# Patient Record
Sex: Female | Born: 1980 | Race: Black or African American | Hispanic: No | Smoking: Former smoker
Health system: Southern US, Community
[De-identification: ages and names within clinical notes are randomized; demographics above are authoritative.]

## PROBLEM LIST (undated history)

## (undated) ENCOUNTER — Inpatient Hospital Stay (HOSPITAL_COMMUNITY): Payer: Self-pay

## (undated) ENCOUNTER — Inpatient Hospital Stay (HOSPITAL_COMMUNITY): Payer: Medicaid Other

## (undated) DIAGNOSIS — R569 Unspecified convulsions: Secondary | ICD-10-CM

## (undated) DIAGNOSIS — Z7289 Other problems related to lifestyle: Secondary | ICD-10-CM

## (undated) DIAGNOSIS — R109 Unspecified abdominal pain: Secondary | ICD-10-CM

## (undated) DIAGNOSIS — I10 Essential (primary) hypertension: Secondary | ICD-10-CM

## (undated) DIAGNOSIS — F1911 Other psychoactive substance abuse, in remission: Secondary | ICD-10-CM

## (undated) DIAGNOSIS — A749 Chlamydial infection, unspecified: Secondary | ICD-10-CM

## (undated) DIAGNOSIS — K219 Gastro-esophageal reflux disease without esophagitis: Secondary | ICD-10-CM

## (undated) DIAGNOSIS — IMO0002 Reserved for concepts with insufficient information to code with codable children: Secondary | ICD-10-CM

## (undated) DIAGNOSIS — F319 Bipolar disorder, unspecified: Secondary | ICD-10-CM

## (undated) DIAGNOSIS — D573 Sickle-cell trait: Secondary | ICD-10-CM

## (undated) DIAGNOSIS — B999 Unspecified infectious disease: Secondary | ICD-10-CM

## (undated) DIAGNOSIS — R110 Nausea: Secondary | ICD-10-CM

## (undated) DIAGNOSIS — D649 Anemia, unspecified: Secondary | ICD-10-CM

## (undated) DIAGNOSIS — F419 Anxiety disorder, unspecified: Secondary | ICD-10-CM

## (undated) DIAGNOSIS — A599 Trichomoniasis, unspecified: Secondary | ICD-10-CM

## (undated) DIAGNOSIS — F209 Schizophrenia, unspecified: Secondary | ICD-10-CM

## (undated) DIAGNOSIS — M329 Systemic lupus erythematosus, unspecified: Secondary | ICD-10-CM

## (undated) DIAGNOSIS — F32A Depression, unspecified: Secondary | ICD-10-CM

## (undated) DIAGNOSIS — B009 Herpesviral infection, unspecified: Secondary | ICD-10-CM

## (undated) DIAGNOSIS — G8929 Other chronic pain: Secondary | ICD-10-CM

## (undated) HISTORY — PX: HERNIA REPAIR: SHX51

## (undated) HISTORY — PX: OTHER SURGICAL HISTORY: SHX169

## (undated) HISTORY — PX: DILATION AND CURETTAGE OF UTERUS: SHX78

## (undated) HISTORY — DX: Anemia, unspecified: D64.9

---
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
## 1999-05-20 ENCOUNTER — Inpatient Hospital Stay (HOSPITAL_COMMUNITY): Admission: AD | Admit: 1999-05-20 | Discharge: 1999-05-20 | Payer: Self-pay | Admitting: Obstetrics

## 1999-05-21 ENCOUNTER — Encounter: Payer: Self-pay | Admitting: Obstetrics

## 1999-05-21 ENCOUNTER — Encounter (INDEPENDENT_AMBULATORY_CARE_PROVIDER_SITE_OTHER): Payer: Self-pay | Admitting: Specialist

## 1999-05-21 ENCOUNTER — Inpatient Hospital Stay (HOSPITAL_COMMUNITY): Admission: AD | Admit: 1999-05-21 | Discharge: 1999-05-21 | Payer: Self-pay | Admitting: Obstetrics

## 1999-07-25 ENCOUNTER — Inpatient Hospital Stay (HOSPITAL_COMMUNITY): Admission: AD | Admit: 1999-07-25 | Discharge: 1999-07-25 | Payer: Self-pay | Admitting: Obstetrics

## 1999-09-25 ENCOUNTER — Inpatient Hospital Stay (HOSPITAL_COMMUNITY): Admission: AD | Admit: 1999-09-25 | Discharge: 1999-09-25 | Payer: Self-pay | Admitting: *Deleted

## 1999-11-28 ENCOUNTER — Inpatient Hospital Stay (HOSPITAL_COMMUNITY): Admission: AD | Admit: 1999-11-28 | Discharge: 1999-11-28 | Payer: Self-pay | Admitting: Obstetrics

## 1999-12-07 ENCOUNTER — Inpatient Hospital Stay (HOSPITAL_COMMUNITY): Admission: AD | Admit: 1999-12-07 | Discharge: 1999-12-07 | Payer: Self-pay | Admitting: Obstetrics

## 2000-03-19 ENCOUNTER — Other Ambulatory Visit: Admission: RE | Admit: 2000-03-19 | Discharge: 2000-03-19 | Payer: Self-pay | Admitting: Obstetrics

## 2000-05-08 ENCOUNTER — Emergency Department (HOSPITAL_COMMUNITY): Admission: EM | Admit: 2000-05-08 | Discharge: 2000-05-08 | Payer: Self-pay | Admitting: Emergency Medicine

## 2000-05-08 ENCOUNTER — Encounter: Payer: Self-pay | Admitting: Emergency Medicine

## 2000-06-05 ENCOUNTER — Inpatient Hospital Stay (HOSPITAL_COMMUNITY): Admission: AD | Admit: 2000-06-05 | Discharge: 2000-06-05 | Payer: Self-pay | Admitting: Obstetrics

## 2000-12-21 ENCOUNTER — Inpatient Hospital Stay (HOSPITAL_COMMUNITY): Admission: AD | Admit: 2000-12-21 | Discharge: 2000-12-21 | Payer: Self-pay | Admitting: *Deleted

## 2000-12-22 ENCOUNTER — Encounter: Payer: Self-pay | Admitting: Obstetrics

## 2000-12-22 ENCOUNTER — Inpatient Hospital Stay (HOSPITAL_COMMUNITY): Admission: AD | Admit: 2000-12-22 | Discharge: 2000-12-22 | Payer: Self-pay | Admitting: Obstetrics

## 2000-12-29 ENCOUNTER — Inpatient Hospital Stay (HOSPITAL_COMMUNITY): Admission: AD | Admit: 2000-12-29 | Discharge: 2000-12-29 | Payer: Self-pay | Admitting: Obstetrics

## 2000-12-30 ENCOUNTER — Inpatient Hospital Stay (HOSPITAL_COMMUNITY): Admission: AD | Admit: 2000-12-30 | Discharge: 2001-01-01 | Payer: Self-pay | Admitting: Obstetrics

## 2001-01-01 ENCOUNTER — Inpatient Hospital Stay (HOSPITAL_COMMUNITY): Admission: AD | Admit: 2001-01-01 | Discharge: 2001-01-01 | Payer: Self-pay | Admitting: Obstetrics

## 2001-01-10 ENCOUNTER — Inpatient Hospital Stay (HOSPITAL_COMMUNITY): Admission: AD | Admit: 2001-01-10 | Discharge: 2001-01-10 | Payer: Self-pay | Admitting: Obstetrics & Gynecology

## 2001-02-09 ENCOUNTER — Inpatient Hospital Stay (HOSPITAL_COMMUNITY): Admission: AD | Admit: 2001-02-09 | Discharge: 2001-02-09 | Payer: Self-pay | Admitting: Obstetrics

## 2001-03-18 ENCOUNTER — Ambulatory Visit (HOSPITAL_COMMUNITY): Admission: RE | Admit: 2001-03-18 | Discharge: 2001-03-18 | Payer: Self-pay | Admitting: Obstetrics

## 2001-04-14 LAB — SICKLE CELL SCREEN

## 2001-05-21 ENCOUNTER — Inpatient Hospital Stay (HOSPITAL_COMMUNITY): Admission: AD | Admit: 2001-05-21 | Discharge: 2001-05-21 | Payer: Self-pay | Admitting: *Deleted

## 2001-05-26 ENCOUNTER — Inpatient Hospital Stay (HOSPITAL_COMMUNITY): Admission: AD | Admit: 2001-05-26 | Discharge: 2001-05-26 | Payer: Self-pay | Admitting: *Deleted

## 2001-06-11 ENCOUNTER — Inpatient Hospital Stay (HOSPITAL_COMMUNITY): Admission: AD | Admit: 2001-06-11 | Discharge: 2001-06-11 | Payer: Self-pay | Admitting: *Deleted

## 2001-06-24 ENCOUNTER — Emergency Department (HOSPITAL_COMMUNITY): Admission: EM | Admit: 2001-06-24 | Discharge: 2001-06-24 | Payer: Self-pay | Admitting: Emergency Medicine

## 2001-07-17 ENCOUNTER — Ambulatory Visit (HOSPITAL_COMMUNITY): Admission: RE | Admit: 2001-07-17 | Discharge: 2001-07-17 | Payer: Self-pay | Admitting: *Deleted

## 2001-08-13 ENCOUNTER — Inpatient Hospital Stay (HOSPITAL_COMMUNITY): Admission: AD | Admit: 2001-08-13 | Discharge: 2001-08-15 | Payer: Self-pay | Admitting: Obstetrics and Gynecology

## 2001-08-19 ENCOUNTER — Inpatient Hospital Stay (HOSPITAL_COMMUNITY): Admission: AD | Admit: 2001-08-19 | Discharge: 2001-08-19 | Payer: Self-pay | Admitting: Obstetrics and Gynecology

## 2001-09-27 ENCOUNTER — Emergency Department (HOSPITAL_COMMUNITY): Admission: EM | Admit: 2001-09-27 | Discharge: 2001-09-27 | Payer: Self-pay | Admitting: Emergency Medicine

## 2001-09-28 ENCOUNTER — Emergency Department (HOSPITAL_COMMUNITY): Admission: EM | Admit: 2001-09-28 | Discharge: 2001-09-28 | Payer: Self-pay | Admitting: Emergency Medicine

## 2001-10-11 ENCOUNTER — Emergency Department (HOSPITAL_COMMUNITY): Admission: EM | Admit: 2001-10-11 | Discharge: 2001-10-11 | Payer: Self-pay | Admitting: Emergency Medicine

## 2002-01-14 ENCOUNTER — Inpatient Hospital Stay (HOSPITAL_COMMUNITY): Admission: AD | Admit: 2002-01-14 | Discharge: 2002-01-14 | Payer: Self-pay | Admitting: Obstetrics and Gynecology

## 2002-03-24 ENCOUNTER — Inpatient Hospital Stay (HOSPITAL_COMMUNITY): Admission: EM | Admit: 2002-03-24 | Discharge: 2002-03-26 | Payer: Self-pay | Admitting: Psychiatry

## 2003-10-05 ENCOUNTER — Inpatient Hospital Stay (HOSPITAL_COMMUNITY): Admission: AD | Admit: 2003-10-05 | Discharge: 2003-10-05 | Payer: Self-pay | Admitting: *Deleted

## 2004-02-18 ENCOUNTER — Emergency Department (HOSPITAL_COMMUNITY): Admission: EM | Admit: 2004-02-18 | Discharge: 2004-02-18 | Payer: Self-pay | Admitting: Emergency Medicine

## 2004-05-18 ENCOUNTER — Inpatient Hospital Stay (HOSPITAL_COMMUNITY): Admission: AD | Admit: 2004-05-18 | Discharge: 2004-05-19 | Payer: Self-pay | Admitting: Family Medicine

## 2004-08-01 ENCOUNTER — Inpatient Hospital Stay (HOSPITAL_COMMUNITY): Admission: AD | Admit: 2004-08-01 | Discharge: 2004-08-01 | Payer: Self-pay | Admitting: Obstetrics and Gynecology

## 2004-08-30 ENCOUNTER — Emergency Department (HOSPITAL_COMMUNITY): Admission: EM | Admit: 2004-08-30 | Discharge: 2004-08-30 | Payer: Self-pay | Admitting: Family Medicine

## 2004-11-03 ENCOUNTER — Emergency Department (HOSPITAL_COMMUNITY): Admission: EM | Admit: 2004-11-03 | Discharge: 2004-11-03 | Payer: Self-pay | Admitting: Emergency Medicine

## 2004-11-06 ENCOUNTER — Emergency Department (HOSPITAL_COMMUNITY): Admission: EM | Admit: 2004-11-06 | Discharge: 2004-11-06 | Payer: Self-pay | Admitting: Emergency Medicine

## 2004-11-25 ENCOUNTER — Emergency Department (HOSPITAL_COMMUNITY): Admission: EM | Admit: 2004-11-25 | Discharge: 2004-11-25 | Payer: Self-pay | Admitting: Emergency Medicine

## 2004-12-04 ENCOUNTER — Ambulatory Visit: Payer: Self-pay | Admitting: Psychiatry

## 2004-12-04 ENCOUNTER — Inpatient Hospital Stay (HOSPITAL_COMMUNITY): Admission: AD | Admit: 2004-12-04 | Discharge: 2004-12-13 | Payer: Self-pay | Admitting: Psychiatry

## 2005-04-16 ENCOUNTER — Emergency Department (HOSPITAL_COMMUNITY): Admission: EM | Admit: 2005-04-16 | Discharge: 2005-04-17 | Payer: Self-pay | Admitting: Emergency Medicine

## 2005-04-16 ENCOUNTER — Emergency Department (HOSPITAL_COMMUNITY): Admission: EM | Admit: 2005-04-16 | Discharge: 2005-04-16 | Payer: Self-pay | Admitting: Emergency Medicine

## 2006-01-09 ENCOUNTER — Emergency Department (HOSPITAL_COMMUNITY): Admission: EM | Admit: 2006-01-09 | Discharge: 2006-01-09 | Payer: Self-pay | Admitting: Emergency Medicine

## 2006-01-11 ENCOUNTER — Emergency Department (HOSPITAL_COMMUNITY): Admission: EM | Admit: 2006-01-11 | Discharge: 2006-01-11 | Payer: Self-pay | Admitting: Emergency Medicine

## 2006-01-25 ENCOUNTER — Emergency Department (HOSPITAL_COMMUNITY): Admission: EM | Admit: 2006-01-25 | Discharge: 2006-01-25 | Payer: Self-pay | Admitting: Emergency Medicine

## 2006-02-08 ENCOUNTER — Emergency Department (HOSPITAL_COMMUNITY): Admission: EM | Admit: 2006-02-08 | Discharge: 2006-02-08 | Payer: Self-pay | Admitting: Emergency Medicine

## 2006-03-24 ENCOUNTER — Inpatient Hospital Stay (HOSPITAL_COMMUNITY): Admission: AD | Admit: 2006-03-24 | Discharge: 2006-03-24 | Payer: Self-pay | Admitting: Obstetrics & Gynecology

## 2006-04-08 DIAGNOSIS — S069XAA Unspecified intracranial injury with loss of consciousness status unknown, initial encounter: Secondary | ICD-10-CM

## 2006-04-08 HISTORY — DX: Unspecified intracranial injury with loss of consciousness status unknown, initial encounter: S06.9XAA

## 2006-04-15 ENCOUNTER — Ambulatory Visit (HOSPITAL_COMMUNITY): Admission: RE | Admit: 2006-04-15 | Discharge: 2006-04-15 | Payer: Self-pay | Admitting: Obstetrics & Gynecology

## 2006-07-02 ENCOUNTER — Inpatient Hospital Stay (HOSPITAL_COMMUNITY): Admission: AD | Admit: 2006-07-02 | Discharge: 2006-07-02 | Payer: Self-pay | Admitting: Gynecology

## 2006-08-19 ENCOUNTER — Ambulatory Visit: Payer: Self-pay | Admitting: *Deleted

## 2006-08-19 ENCOUNTER — Inpatient Hospital Stay (HOSPITAL_COMMUNITY): Admission: AD | Admit: 2006-08-19 | Discharge: 2006-08-19 | Payer: Self-pay | Admitting: Obstetrics & Gynecology

## 2006-08-19 ENCOUNTER — Ambulatory Visit: Payer: Self-pay | Admitting: Obstetrics and Gynecology

## 2006-10-03 ENCOUNTER — Ambulatory Visit: Payer: Self-pay | Admitting: Obstetrics and Gynecology

## 2006-10-03 ENCOUNTER — Inpatient Hospital Stay (HOSPITAL_COMMUNITY): Admission: AD | Admit: 2006-10-03 | Discharge: 2006-10-03 | Payer: Self-pay | Admitting: Gynecology

## 2006-10-15 ENCOUNTER — Encounter: Payer: Self-pay | Admitting: Obstetrics and Gynecology

## 2006-10-15 ENCOUNTER — Ambulatory Visit: Payer: Self-pay | Admitting: Obstetrics & Gynecology

## 2006-10-29 ENCOUNTER — Ambulatory Visit: Payer: Self-pay | Admitting: Obstetrics & Gynecology

## 2006-11-13 ENCOUNTER — Inpatient Hospital Stay (HOSPITAL_COMMUNITY): Admission: AD | Admit: 2006-11-13 | Discharge: 2006-11-15 | Payer: Self-pay | Admitting: Gynecology

## 2006-11-13 ENCOUNTER — Ambulatory Visit: Payer: Self-pay | Admitting: *Deleted

## 2007-03-07 ENCOUNTER — Emergency Department (HOSPITAL_COMMUNITY): Admission: EM | Admit: 2007-03-07 | Discharge: 2007-03-08 | Payer: Self-pay | Admitting: *Deleted

## 2007-03-17 ENCOUNTER — Emergency Department (HOSPITAL_COMMUNITY): Admission: EM | Admit: 2007-03-17 | Discharge: 2007-03-17 | Payer: Self-pay | Admitting: Emergency Medicine

## 2007-05-30 ENCOUNTER — Emergency Department (HOSPITAL_COMMUNITY): Admission: EM | Admit: 2007-05-30 | Discharge: 2007-05-30 | Payer: Self-pay | Admitting: Emergency Medicine

## 2007-06-02 ENCOUNTER — Inpatient Hospital Stay (HOSPITAL_COMMUNITY): Admission: AD | Admit: 2007-06-02 | Discharge: 2007-06-02 | Payer: Self-pay | Admitting: Family Medicine

## 2007-06-03 ENCOUNTER — Ambulatory Visit (HOSPITAL_COMMUNITY): Admission: AD | Admit: 2007-06-03 | Discharge: 2007-06-03 | Payer: Self-pay | Admitting: Obstetrics & Gynecology

## 2007-06-03 ENCOUNTER — Ambulatory Visit: Payer: Self-pay | Admitting: Obstetrics & Gynecology

## 2007-06-03 ENCOUNTER — Encounter: Payer: Self-pay | Admitting: Obstetrics & Gynecology

## 2007-06-06 ENCOUNTER — Inpatient Hospital Stay (HOSPITAL_COMMUNITY): Admission: AD | Admit: 2007-06-06 | Discharge: 2007-06-06 | Payer: Self-pay | Admitting: Gynecology

## 2008-01-30 ENCOUNTER — Emergency Department (HOSPITAL_COMMUNITY): Admission: EM | Admit: 2008-01-30 | Discharge: 2008-01-31 | Payer: Self-pay | Admitting: Emergency Medicine

## 2008-02-04 ENCOUNTER — Emergency Department (HOSPITAL_COMMUNITY): Admission: EM | Admit: 2008-02-04 | Discharge: 2008-02-04 | Payer: Self-pay | Admitting: Emergency Medicine

## 2008-02-11 ENCOUNTER — Emergency Department (HOSPITAL_COMMUNITY): Admission: EM | Admit: 2008-02-11 | Discharge: 2008-02-11 | Payer: Self-pay | Admitting: Emergency Medicine

## 2008-02-25 ENCOUNTER — Emergency Department (HOSPITAL_BASED_OUTPATIENT_CLINIC_OR_DEPARTMENT_OTHER): Admission: EM | Admit: 2008-02-25 | Discharge: 2008-02-25 | Payer: Self-pay | Admitting: Emergency Medicine

## 2008-04-08 HISTORY — PX: HEMORROIDECTOMY: SUR656

## 2008-04-18 ENCOUNTER — Ambulatory Visit (HOSPITAL_COMMUNITY): Admission: RE | Admit: 2008-04-18 | Discharge: 2008-04-18 | Payer: Self-pay | Admitting: Obstetrics

## 2008-05-10 ENCOUNTER — Inpatient Hospital Stay (HOSPITAL_COMMUNITY): Admission: AD | Admit: 2008-05-10 | Discharge: 2008-05-10 | Payer: Self-pay | Admitting: Obstetrics & Gynecology

## 2008-05-16 ENCOUNTER — Emergency Department (HOSPITAL_COMMUNITY): Admission: EM | Admit: 2008-05-16 | Discharge: 2008-05-16 | Payer: Self-pay | Admitting: Emergency Medicine

## 2008-07-17 ENCOUNTER — Emergency Department (HOSPITAL_COMMUNITY): Admission: EM | Admit: 2008-07-17 | Discharge: 2008-07-17 | Payer: Self-pay | Admitting: Family Medicine

## 2008-08-16 ENCOUNTER — Inpatient Hospital Stay (HOSPITAL_COMMUNITY): Admission: AD | Admit: 2008-08-16 | Discharge: 2008-08-16 | Payer: Self-pay | Admitting: Obstetrics

## 2008-09-06 ENCOUNTER — Inpatient Hospital Stay (HOSPITAL_COMMUNITY): Admission: AD | Admit: 2008-09-06 | Discharge: 2008-09-06 | Payer: Self-pay | Admitting: Obstetrics

## 2008-09-07 ENCOUNTER — Inpatient Hospital Stay (HOSPITAL_COMMUNITY): Admission: RE | Admit: 2008-09-07 | Discharge: 2008-09-09 | Payer: Self-pay | Admitting: Obstetrics & Gynecology

## 2008-11-15 ENCOUNTER — Encounter (INDEPENDENT_AMBULATORY_CARE_PROVIDER_SITE_OTHER): Payer: Self-pay | Admitting: General Surgery

## 2008-11-15 ENCOUNTER — Ambulatory Visit (HOSPITAL_BASED_OUTPATIENT_CLINIC_OR_DEPARTMENT_OTHER): Admission: RE | Admit: 2008-11-15 | Discharge: 2008-11-15 | Payer: Self-pay | Admitting: General Surgery

## 2008-12-17 ENCOUNTER — Emergency Department (HOSPITAL_COMMUNITY): Admission: EM | Admit: 2008-12-17 | Discharge: 2008-12-17 | Payer: Self-pay | Admitting: Emergency Medicine

## 2009-03-25 ENCOUNTER — Emergency Department (HOSPITAL_COMMUNITY): Admission: EM | Admit: 2009-03-25 | Discharge: 2009-03-25 | Payer: Self-pay | Admitting: Emergency Medicine

## 2009-04-04 ENCOUNTER — Emergency Department (HOSPITAL_COMMUNITY): Admission: EM | Admit: 2009-04-04 | Discharge: 2009-04-04 | Payer: Self-pay | Admitting: Emergency Medicine

## 2009-06-21 ENCOUNTER — Emergency Department (HOSPITAL_COMMUNITY): Admission: EM | Admit: 2009-06-21 | Discharge: 2009-06-22 | Payer: Self-pay | Admitting: Internal Medicine

## 2009-08-02 ENCOUNTER — Emergency Department (HOSPITAL_COMMUNITY): Admission: EM | Admit: 2009-08-02 | Discharge: 2009-08-02 | Payer: Self-pay | Admitting: Emergency Medicine

## 2009-08-06 ENCOUNTER — Ambulatory Visit: Payer: Self-pay | Admitting: Advanced Practice Midwife

## 2009-08-06 ENCOUNTER — Inpatient Hospital Stay (HOSPITAL_COMMUNITY): Admission: AD | Admit: 2009-08-06 | Discharge: 2009-08-06 | Payer: Self-pay | Admitting: Obstetrics & Gynecology

## 2009-08-22 ENCOUNTER — Ambulatory Visit (HOSPITAL_COMMUNITY): Admission: RE | Admit: 2009-08-22 | Discharge: 2009-08-22 | Payer: Self-pay | Admitting: Obstetrics

## 2009-11-14 ENCOUNTER — Ambulatory Visit (HOSPITAL_COMMUNITY): Admission: RE | Admit: 2009-11-14 | Discharge: 2009-11-14 | Payer: Self-pay | Admitting: Obstetrics

## 2010-02-16 ENCOUNTER — Inpatient Hospital Stay (HOSPITAL_COMMUNITY): Admission: AD | Admit: 2010-02-16 | Discharge: 2010-02-21 | Payer: Self-pay | Admitting: Obstetrics & Gynecology

## 2010-02-19 ENCOUNTER — Encounter: Payer: Self-pay | Admitting: Obstetrics

## 2010-02-19 DIAGNOSIS — F3112 Bipolar disorder, current episode manic without psychotic features, moderate: Secondary | ICD-10-CM

## 2010-03-25 ENCOUNTER — Inpatient Hospital Stay (HOSPITAL_COMMUNITY)
Admission: AD | Admit: 2010-03-25 | Discharge: 2010-03-25 | Payer: Self-pay | Source: Home / Self Care | Attending: Obstetrics | Admitting: Obstetrics

## 2010-03-26 ENCOUNTER — Inpatient Hospital Stay (HOSPITAL_COMMUNITY)
Admission: AD | Admit: 2010-03-26 | Discharge: 2010-03-29 | Payer: Self-pay | Source: Home / Self Care | Attending: Obstetrics & Gynecology | Admitting: Obstetrics & Gynecology

## 2010-04-29 ENCOUNTER — Encounter: Payer: Self-pay | Admitting: Obstetrics & Gynecology

## 2010-04-29 ENCOUNTER — Encounter: Payer: Self-pay | Admitting: Obstetrics

## 2010-06-18 LAB — RPR: RPR Ser Ql: NONREACTIVE

## 2010-06-18 LAB — COMPREHENSIVE METABOLIC PANEL
ALT: 9 U/L (ref 0–35)
CO2: 19 mEq/L (ref 19–32)
Calcium: 9.2 mg/dL (ref 8.4–10.5)
Chloride: 102 mEq/L (ref 96–112)
Creatinine, Ser: 0.62 mg/dL (ref 0.4–1.2)
GFR calc non Af Amer: >60 mL/min (ref >60)
Glucose, Bld: 65 mg/dL — ABNORMAL LOW (ref 70–99)
Total Bilirubin: 2.2 mg/dL — ABNORMAL HIGH (ref 0.3–1.2)

## 2010-06-18 LAB — CBC
HCT: 31 % — ABNORMAL LOW (ref 36.0–46.0)
HCT: 35.6 % — ABNORMAL LOW (ref 36.0–46.0)
Hemoglobin: 12 g/dL (ref 12.0–15.0)
MCH: 29.5 pg (ref 26.0–34.0)
MCH: 29.8 pg (ref 26.0–34.0)
MCHC: 33.7 g/dL (ref 30.0–36.0)
MCHC: 34.2 g/dL (ref 30.0–36.0)
RDW: 15.3 % (ref 11.5–15.5)

## 2010-06-18 LAB — LACTATE DEHYDROGENASE: LDH: 132 U/L (ref 94–250)

## 2010-06-18 LAB — MRSA PCR SCREENING: MRSA by PCR: NEGATIVE

## 2010-06-20 LAB — STREP B DNA PROBE: Strep Group B Ag: NEGATIVE

## 2010-06-20 LAB — WET PREP, GENITAL
Clue Cells Wet Prep HPF POC: NONE SEEN
Trich, Wet Prep: NONE SEEN

## 2010-06-20 LAB — COMPREHENSIVE METABOLIC PANEL
ALT: 15 U/L (ref 0–35)
AST: 24 U/L (ref 0–37)
Albumin: 2.7 g/dL — ABNORMAL LOW (ref 3.5–5.2)
CO2: 23 mEq/L (ref 19–32)
Chloride: 104 mEq/L (ref 96–112)
Creatinine, Ser: 0.58 mg/dL (ref 0.4–1.2)
GFR calc Af Amer: >60 mL/min (ref >60)
GFR calc non Af Amer: >60 mL/min (ref >60)
Sodium: 134 mEq/L — ABNORMAL LOW (ref 135–145)
Total Bilirubin: 0.4 mg/dL (ref 0.3–1.2)

## 2010-06-20 LAB — RAPID URINE DRUG SCREEN, HOSP PERFORMED
Amphetamines: NOT DETECTED
Tetrahydrocannabinol: POSITIVE — AB

## 2010-06-20 LAB — URINALYSIS, ROUTINE W REFLEX MICROSCOPIC
Glucose, UA: NEGATIVE mg/dL
Hgb urine dipstick: NEGATIVE
Ketones, ur: >80 mg/dL — AB
Protein, ur: NEGATIVE mg/dL
Urobilinogen, UA: 1 mg/dL (ref 0.0–1.0)

## 2010-06-20 LAB — CBC
Hemoglobin: 10.2 g/dL — ABNORMAL LOW (ref 12.0–15.0)
Platelets: 187 10*3/uL (ref 150–400)
RBC: 3.17 MIL/uL — ABNORMAL LOW (ref 3.87–5.11)
WBC: 14.6 10*3/uL — ABNORMAL HIGH (ref 4.0–10.5)

## 2010-06-20 LAB — MRSA PCR SCREENING: MRSA by PCR: NEGATIVE

## 2010-06-20 LAB — GC/CHLAMYDIA PROBE AMP, GENITAL: GC Probe Amp, Genital: NEGATIVE

## 2010-06-26 LAB — COMPREHENSIVE METABOLIC PANEL
Albumin: 3.4 g/dL — ABNORMAL LOW (ref 3.5–5.2)
Alkaline Phosphatase: 45 U/L (ref 39–117)
BUN: 7 mg/dL (ref 6–23)
Calcium: 9 mg/dL (ref 8.4–10.5)
Glucose, Bld: 90 mg/dL (ref 70–99)
Potassium: 3.5 mEq/L (ref 3.5–5.1)
Sodium: 136 mEq/L (ref 135–145)
Total Protein: 5.9 g/dL — ABNORMAL LOW (ref 6.0–8.3)

## 2010-06-26 LAB — URINALYSIS, ROUTINE W REFLEX MICROSCOPIC
Bilirubin Urine: NEGATIVE
Hgb urine dipstick: NEGATIVE
Ketones, ur: NEGATIVE mg/dL
Ketones, ur: 15 mg/dL — AB
Leukocytes, UA: NEGATIVE
Nitrite: NEGATIVE
Nitrite: NEGATIVE
Specific Gravity, Urine: 1.015 (ref 1.005–1.030)
Urobilinogen, UA: 1 mg/dL (ref 0.0–1.0)
pH: 7 (ref 5.0–8.0)
pH: 6 (ref 5.0–8.0)

## 2010-06-26 LAB — URINE CULTURE: Colony Count: NO GROWTH

## 2010-06-26 LAB — RAPID URINE DRUG SCREEN, HOSP PERFORMED
Amphetamines: NOT DETECTED
Amphetamines: NOT DETECTED
Cocaine: POSITIVE — AB
Cocaine: POSITIVE — AB
Opiates: NOT DETECTED
Opiates: NOT DETECTED
Tetrahydrocannabinol: POSITIVE — AB
Tetrahydrocannabinol: POSITIVE — AB

## 2010-06-26 LAB — CBC
HCT: 32 % — ABNORMAL LOW (ref 36.0–46.0)
Hemoglobin: 10.9 g/dL — ABNORMAL LOW (ref 12.0–15.0)
Hemoglobin: 13.5 g/dL (ref 12.0–15.0)
MCHC: 34.2 g/dL (ref 30.0–36.0)
MCHC: 33.3 g/dL (ref 30.0–36.0)
Platelets: 168 10*3/uL (ref 150–400)
RBC: 4.45 MIL/uL (ref 3.87–5.11)
RDW: 14.8 % (ref 11.5–15.5)
WBC: 11.1 10*3/uL — ABNORMAL HIGH (ref 4.0–10.5)

## 2010-06-26 LAB — DIFFERENTIAL
Basophils Relative: 1 % (ref 0–1)
Lymphocytes Relative: 19 % (ref 12–46)
Lymphs Abs: 2.1 10*3/uL (ref 0.7–4.0)
Monocytes Absolute: 1 10*3/uL (ref 0.1–1.0)
Monocytes Relative: 9 % (ref 3–12)
Neutro Abs: 7.9 10*3/uL — ABNORMAL HIGH (ref 1.7–7.7)
Neutrophils Relative %: 71 % (ref 43–77)

## 2010-06-26 LAB — PREGNANCY, URINE: Preg Test, Ur: POSITIVE

## 2010-06-26 LAB — WET PREP, GENITAL
Trich, Wet Prep: NONE SEEN
Yeast Wet Prep HPF POC: NONE SEEN

## 2010-06-26 LAB — BASIC METABOLIC PANEL
Calcium: 9.4 mg/dL (ref 8.4–10.5)
Creatinine, Ser: 1.05 mg/dL (ref 0.4–1.2)
GFR calc Af Amer: >60 mL/min (ref >60)
GFR calc non Af Amer: >60 mL/min (ref >60)
Sodium: 135 mEq/L (ref 135–145)

## 2010-06-26 LAB — GC/CHLAMYDIA PROBE AMP, GENITAL
Chlamydia, DNA Probe: NEGATIVE
GC Probe Amp, Genital: NEGATIVE

## 2010-06-26 LAB — ACETAMINOPHEN LEVEL: Acetaminophen (Tylenol), Serum: <10 ug/mL — ABNORMAL LOW (ref 10–30)

## 2010-06-26 LAB — POCT PREGNANCY, URINE: Preg Test, Ur: POSITIVE

## 2010-06-26 LAB — URINE MICROSCOPIC-ADD ON

## 2010-06-26 LAB — ETHANOL: Alcohol, Ethyl (B): <5 mg/dL (ref 0–10)

## 2010-06-26 LAB — HCG, QUANTITATIVE, PREGNANCY: hCG, Beta Chain, Quant, S: 12476 m[IU]/mL — ABNORMAL HIGH (ref <5)

## 2010-07-01 LAB — DIFFERENTIAL
Basophils Absolute: 0.1 10*3/uL (ref 0.0–0.1)
Basophils Relative: 1 % (ref 0–1)
Monocytes Absolute: 0.9 10*3/uL (ref 0.1–1.0)
Neutro Abs: 7.2 10*3/uL (ref 1.7–7.7)
Neutrophils Relative %: 65 % (ref 43–77)

## 2010-07-01 LAB — URINALYSIS, ROUTINE W REFLEX MICROSCOPIC
Glucose, UA: NEGATIVE mg/dL
pH: 5.5 (ref 5.0–8.0)

## 2010-07-01 LAB — CBC
HCT: 38 % (ref 36.0–46.0)
Hemoglobin: 13 g/dL (ref 12.0–15.0)
WBC: 11.1 10*3/uL — ABNORMAL HIGH (ref 4.0–10.5)

## 2010-07-01 LAB — WET PREP, GENITAL: Yeast Wet Prep HPF POC: NONE SEEN

## 2010-07-01 LAB — COMPREHENSIVE METABOLIC PANEL
Albumin: 4.5 g/dL (ref 3.5–5.2)
Alkaline Phosphatase: 63 U/L (ref 39–117)
BUN: 8 mg/dL (ref 6–23)
CO2: 20 mEq/L (ref 19–32)
Chloride: 103 mEq/L (ref 96–112)
GFR calc non Af Amer: >60 mL/min (ref >60)
Glucose, Bld: 69 mg/dL — ABNORMAL LOW (ref 70–99)
Potassium: 3.7 mEq/L (ref 3.5–5.1)
Total Bilirubin: 2.3 mg/dL — ABNORMAL HIGH (ref 0.3–1.2)

## 2010-07-01 LAB — POCT PREGNANCY, URINE: Preg Test, Ur: NEGATIVE

## 2010-07-09 LAB — BASIC METABOLIC PANEL
CO2: 21 mEq/L (ref 19–32)
Chloride: 105 mEq/L (ref 96–112)
GFR calc Af Amer: >60 mL/min (ref >60)
Sodium: 133 mEq/L — ABNORMAL LOW (ref 135–145)

## 2010-07-09 LAB — POCT PREGNANCY, URINE: Preg Test, Ur: NEGATIVE

## 2010-07-09 LAB — URINALYSIS, ROUTINE W REFLEX MICROSCOPIC
Glucose, UA: NEGATIVE mg/dL
Hgb urine dipstick: NEGATIVE
Specific Gravity, Urine: 1.018 (ref 1.005–1.030)
Urobilinogen, UA: 1 mg/dL (ref 0.0–1.0)
pH: 7.5 (ref 5.0–8.0)

## 2010-07-09 LAB — URINE CULTURE: Colony Count: 30000

## 2010-07-09 LAB — GC/CHLAMYDIA PROBE AMP, GENITAL: GC Probe Amp, Genital: NEGATIVE

## 2010-07-13 LAB — POCT URINALYSIS DIP (DEVICE)
Glucose, UA: NEGATIVE mg/dL
Specific Gravity, Urine: 1.02 (ref 1.005–1.030)
Urobilinogen, UA: 0.2 mg/dL (ref 0.0–1.0)

## 2010-07-13 LAB — URINE CULTURE: Colony Count: 65000

## 2010-07-13 LAB — WET PREP, GENITAL
WBC, Wet Prep HPF POC: NONE SEEN
Yeast Wet Prep HPF POC: NONE SEEN

## 2010-07-13 LAB — POCT PREGNANCY, URINE: Preg Test, Ur: NEGATIVE

## 2010-07-14 LAB — POCT I-STAT 4, (NA,K, GLUC, HGB,HCT): Potassium: 3.5 mEq/L (ref 3.5–5.1)

## 2010-07-16 LAB — CBC
HCT: 28.6 % — ABNORMAL LOW (ref 36.0–46.0)
HCT: 29 % — ABNORMAL LOW (ref 36.0–46.0)
Hemoglobin: 9.6 g/dL — ABNORMAL LOW (ref 12.0–15.0)
Hemoglobin: 9.7 g/dL — ABNORMAL LOW (ref 12.0–15.0)
MCHC: 33.5 g/dL (ref 30.0–36.0)
MCV: 84.5 fL (ref 78.0–100.0)
MCV: 85.2 fL (ref 78.0–100.0)
RBC: 3.43 MIL/uL — ABNORMAL LOW (ref 3.87–5.11)
RDW: 18.3 % — ABNORMAL HIGH (ref 11.5–15.5)
WBC: 10.9 10*3/uL — ABNORMAL HIGH (ref 4.0–10.5)

## 2010-07-17 LAB — URINALYSIS, ROUTINE W REFLEX MICROSCOPIC
Bilirubin Urine: NEGATIVE
Hgb urine dipstick: NEGATIVE
Protein, ur: NEGATIVE mg/dL
Urobilinogen, UA: 1 mg/dL (ref 0.0–1.0)

## 2010-07-17 LAB — WET PREP, GENITAL: Trich, Wet Prep: NONE SEEN

## 2010-07-17 LAB — CBC
Hemoglobin: 9.5 g/dL — ABNORMAL LOW (ref 12.0–15.0)
RBC: 3.28 MIL/uL — ABNORMAL LOW (ref 3.87–5.11)
RDW: 16.6 % — ABNORMAL HIGH (ref 11.5–15.5)

## 2010-07-18 LAB — WET PREP, GENITAL: Trich, Wet Prep: NONE SEEN

## 2010-07-18 LAB — POCT URINALYSIS DIP (DEVICE)
Glucose, UA: NEGATIVE mg/dL
Hgb urine dipstick: NEGATIVE
pH: 6 (ref 5.0–8.0)

## 2010-07-24 LAB — URINALYSIS, ROUTINE W REFLEX MICROSCOPIC
Bilirubin Urine: NEGATIVE
Glucose, UA: NEGATIVE mg/dL
Hgb urine dipstick: NEGATIVE
Ketones, ur: NEGATIVE mg/dL
Leukocytes, UA: NEGATIVE
Nitrite: NEGATIVE
Protein, ur: 30 mg/dL — AB
Protein, ur: NEGATIVE mg/dL
Urobilinogen, UA: 0.2 mg/dL (ref 0.0–1.0)
pH: 6 (ref 5.0–8.0)
pH: 6 (ref 5.0–8.0)

## 2010-07-24 LAB — COMPREHENSIVE METABOLIC PANEL
AST: 22 U/L (ref 0–37)
Albumin: 2.5 g/dL — ABNORMAL LOW (ref 3.5–5.2)
Alkaline Phosphatase: 42 U/L (ref 39–117)
Chloride: 107 mEq/L (ref 96–112)
Creatinine, Ser: 0.5 mg/dL (ref 0.4–1.2)
GFR calc Af Amer: >60 mL/min (ref >60)
Potassium: 3.2 mEq/L — ABNORMAL LOW (ref 3.5–5.1)
Total Bilirubin: 0.6 mg/dL (ref 0.3–1.2)
Total Protein: 5.1 g/dL — ABNORMAL LOW (ref 6.0–8.3)

## 2010-07-24 LAB — URINE MICROSCOPIC-ADD ON

## 2010-07-24 LAB — CBC
Hemoglobin: 10.5 g/dL — ABNORMAL LOW (ref 12.0–15.0)
MCHC: 34.4 g/dL (ref 30.0–36.0)
RBC: 3.29 MIL/uL — ABNORMAL LOW (ref 3.87–5.11)
WBC: 11.5 10*3/uL — ABNORMAL HIGH (ref 4.0–10.5)

## 2010-07-24 LAB — DIFFERENTIAL
Basophils Absolute: 0.2 10*3/uL — ABNORMAL HIGH (ref 0.0–0.1)
Eosinophils Relative: 1 % (ref 0–5)
Lymphocytes Relative: 16 % (ref 12–46)
Monocytes Absolute: 0.9 10*3/uL (ref 0.1–1.0)
Monocytes Relative: 8 % (ref 3–12)

## 2010-07-24 LAB — WET PREP, GENITAL: Yeast Wet Prep HPF POC: NONE SEEN

## 2010-07-24 LAB — GC/CHLAMYDIA PROBE AMP, GENITAL
Chlamydia, DNA Probe: POSITIVE — AB
GC Probe Amp, Genital: NEGATIVE

## 2010-08-21 NOTE — Op Note (Signed)
NAMEVIVIANA, Crane              ACCOUNT NO.:  [id number]   MEDICAL RECORD NO.:  [id number]          PATIENT TYPE:  AMB   LOCATION:  [facility]                         FACILITY:  [facility]   PHYSICIAN:  Anselm Pancoast. Weatherly, M.D.DATE OF BIRTH:  03-05-1981   DATE OF PROCEDURE:  11/15/2008  DATE OF DISCHARGE:                               OPERATIVE REPORT   PREOPERATIVE DIAGNOSIS:  Internal and external hemorrhoids prolapsing  and bleeding, three quadrants.   OPERATION:  Internal and external hemorrhoidectomy, three quadrants,  complex,   HISTORY:  [name]  is a 30 year old black female who I first saw  on the office approximately 3 weeks ago when she presented.  She has had  problems with hemorrhoids after each of her pregnancy.  She has recently  had a third pregnancy and has had these big prolapsing hemorrhoids that  prolapse out and have to be manually reduced, bleed and quite painful.  I saw her and recommended that we proceeded with a formal internal and  external hemorrhoidectomy as there is no way these could be managed with  rubber bands, etc.  She is here today for the planned procedure.  She  had a good bowel movement yesterday, but did not take the laxative last  night and we gave her Fleet's enema this morning.  She otherwise is in  good health.  She has been taking pretty significant pain medication for  these hemorrhoids prescribed by her regular physician.   The patient was given 3 grams of Unasyn and positioned on the OR table.  Induction of general anesthesia and endotracheal tube placed and then I  prepped the hemorrhoids.  I  first examined her to make sure that all  the gross stool was out and it was and then with the patient up in  yellow fin stirrups the three great big hemorrhoids prolapsed out.  I  then elevated the buttocks after prepping it with Betadine surgical  scrub and solution and draped her in a sterile manner.  First, I  anesthetized the area with  about a 20 mL of Marcaine with adrenaline  about five quadrants of 10 mL in the left and the right side and then  she had been draped in a sterile manner.  The patient's head was down  somewhat and we elected to go ahead and removed the anterior to the  right hemorrhoid first, elevating it off the sphincter under direct  vision and then after I had sort of controlled the little bleeding,  medial and lateral of 3-0 chromic.  I then used a Buie clamp across the  base of the hemorrhoid, removing the clamp and then putting about three  U stitches of 2-0 chromic and then starting at the apex, but with good  suture I have hopefully got superior hemorrhoidal and then sutured over  the Buie clamp and after approximately four throws over the Buie clamp  removed it, pulled the sutures tight and then go ahead and closed the  external portion of the incision with either a continuous running 2-0  chromic locking some as we were coming  out.  The external portion was  closed.  The patient's hemorrhoids were predominantly prolapsing  internal and external hemorrhoids, more internal than external.  Next,  the left posterior lateral quadrant was treated in a similar manner.  It  was not quite as large, but it was a very large internal and external  hemorrhoid and it was closed in a similar manner.  The last was a  smaller hemorrhoid on the left side and elevated it off the sphincter.  I used some interrupted 2-0 chromic since the part under the Buie clamp  was much smaller.  I put about two U stitches of the 2-0 chromic and  then starting at the apex a generous stitch put a couple of ties over  the Bowie clamp and removed the Buie clamp, pulled it snug and then  closed the internal and external portion of the hemorrhoid incision.  I  placed an additional figure-of-eight suture in the one over on the right  posterior lateral.  Good hemostasis, suture line inspected and I put  Xylocaine 5% ointment within the  anal canal and I opened a 4x4 kind of  loosely placed in the anal canal.  The 4x4s and ABD were placed, held in  place with stretch panties and surgery completed.  The patient tolerated  the procedure nicely and she will be released after a short stay.   Her mother will be taking care of her and her three children over the  next few days and she will have the Xylocaine ointment and the Vicodin  for pain management.      Anselm Pancoast. Zachery Dakins, M.D.  Electronically Signed     WJW/MEDQ  D:  11/15/2008  T:  11/15/2008  Job:  [id number]   cc:   Clearance Coots, Dr.

## 2010-08-21 NOTE — Op Note (Signed)
[name], [name]              ACCOUNT NO.:  [id number]   MEDICAL RECORD NO.:  [id number]          PATIENT TYPE:  MAT   LOCATION:  MATC                          FACILITY:  [facility]   PHYSICIAN:  Lesly Dukes, M.D. DATE OF BIRTH:  [birth year]   DATE OF PROCEDURE:  06/03/2007  DATE OF DISCHARGE:  06/03/2007                               OPERATIVE REPORT   PREOPERATIVE DIAGNOSIS:  A 30 year old gravida 4, para 2, 0, 1, 2, with  retained products of conception after an eight-week spontaneous  miscarriage.   POSTOPERATIVE DIAGNOSIS:  A 29 year old gravida 4, para 2, 0, 1, 2, with  retained products of conception after an eight-week spontaneous  miscarriage.   PROCEDURE:  Dilation and evacuation.   SURGEON:  Dr. Lesly Dukes.   ASSISTANT:  Dr. Karlton Lemon.   ANESTHESIA:  MAC anesthesia.   SPECIMENS:  E&C to pathology.   FINDINGS:  Enlarged uterus with an anterior fibroid with small amount of  retained products.   COMPLICATIONS:  None.   DESCRIPTION OF PROCEDURE:  After an informed consent was obtained, the  patient was taken to the operating room where MAC anesthesia was  induced.  The patient was placed in the dorsal lithotomy position and  prepared and draped in the normal sterile fashion.  A bimanual exam  revealed a 12-week size uterus with an anterior fibroid.  The cervical  os was already dilated several cm.  The patient was prepared and draped  in the normal sterile fashion.  The bladder was emptied.  There was no  dilation needed.  It was already dilated.  A #7 suction curet was  introduced into the uterus and gentle suction and curettage was  performed.  The suction curet was removed and sharp curettage was  performed.  There as a good cry in all four sides of the uterine cavity.  Final pass with the suction curet was then done to ensure that all  products of conception had been removed.   The patient tolerated the procedure well.  The sponge, lap and  instrument counts were correct x2.  The patient went to the recovery  room in stable condition.      Lesly Dukes, M.D.  Electronically Signed    KHL/MEDQ  D:  06/03/2007  T:  06/03/2007  Job:  [id number]
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
## 2010-08-24 NOTE — Discharge Summary (Signed)
[name], Crane NO.:  [id number]   MEDICAL RECORD NO.:  [id number]                   PATIENT TYPE:  IPS   LOCATION:  0507                                 FACILITY:  [facility]   PHYSICIAN:  Jeanice Lim, M.D.              DATE OF BIRTH:  Oct 24, 1980   DATE OF ADMISSION:  03/24/2002  DATE OF DISCHARGE:  03/26/2002                                 DISCHARGE SUMMARY   IDENTIFYING DATA:  This is a 30 year old single African-American female  involuntarily admitted, reporting status post an overdose.  The patient had  taken her grandmother medications in an attempt to kill herself with a  history of depression.  The patient reported she was not attempting to kill  herself but wanting to get attention from her family, was very upset  regarding her family and wanted to have them leave her alone and she wanted  to move out.  She was angry that was she was admitted and felt that she  wanted to be with her child and denied any dangerous ideation at the time of  evaluation.   MEDICATIONS:  None.   DRUG ALLERGIES:  No known drug allergies.   PHYSICAL EXAMINATION:  GENERAL:  Physical examination was performed at  [facility] was essentially within normal limits.  The patient was  charcoaled.  VITAL SIGNS:  Stable, afebrile.  NEUROLOGIC:  Nonfocal.   LABORATORY DATA:  Routine admission labs: Urine drug screen was positive for  cannabis.  White count was elevated at 18.1.  Urine pregnancy test was  negative.   MENTAL STATUS EXAM:  This was an alert, young female casually dressed.  Speech: Loud and clear.  Mood: Somewhat irritable.  Affect: Irritable.  The  patient denied being significantly depressed, felt that this was  situationally related to ongoing problems with her family and them  interfering with her relationship with her child.  She had no psychotic  symptoms or suicidal or homicidal ideation.  Cognitive: Intact.  Judgment  and insight were  fair.   ADMISSION DIAGNOSES:   AXIS I:  Adjustment disorder, not otherwise specified, versus depressive  disorder, not otherwise specified.   AXIS II:  None.   AXIS III:  None.   AXIS IV:  Severe problems with economic, legal system, and other  psychosocial issues.   AXIS V:  25/70   HOSPITAL COURSE:  The patient was admitted, ordered routine p.r.n.  medications, underwent further monitoring, and was encouraged to participate  in individual, group, and milieu therapy.  Social Services was involved and  met with the patient regarding her child and addressed their concerns.  The  patient was medically monitored and showed appropriate behavior on the unit  with no dangerous ideation, no clear mood swings, no psychotic symptoms.  Her judgment and insight seemed to be adequate.  The patient participated in  aftercare planning, did not feel that she was significantly  depressed nor  would benefit from any antidepressant.   CONDITION ON DISCHARGE:  Her condition on discharge was improved.  Mood was  euthymic.  She was calm.  Affect: Brighter.  Thought processes: Goal  directed.  Thought content: Negative for dangerous ideation or psychotic  symptoms.  The patient reported motivation to cooperate with Social Services  and followup with their aftercare plan including individual therapy.   DISCHARGE MEDICATIONS:  The patient was discharged on no psychotropics.  No  medications were indicated.   FOLLOW UP:  She was to follow up with possibly [facility] on April 15, 2002, at 6:30 p.m.   DISCHARGE DIAGNOSES:   AXIS I:  Adjustment disorder, not otherwise specified, versus depressive  disorder, not otherwise specified.   AXIS II:  None.   AXIS III:  None.   AXIS IV:  Severe problems with economic, legal system, and other  psychosocial issues.   AXIS V:  Global assessment of functioning on discharge was 55.                                                Jeanice Lim, M.D.    JEM/MEDQ  D:  03/29/2002  T:  03/29/2002  Job:  [id number]
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
## 2010-08-24 NOTE — Discharge Summary (Signed)
[facility]  Patient:    [name], [name] Visit Number: [id number] MRN: [id number]          Service Type: EMS Location: ED Attending Physician:  Shelba Flake Dictated by:   Maryelizabeth Rowan, M.D. Admit Date:  10/11/2001 Discharge Date: 10/11/2001                             Discharge Summary  DATE OF BIRTH:  [birth year]  PRINCIPAL DIAGNOSES: 1. Vaginal bleeding. 2. Pelvic inflammatory disease. 3. Early pregnancy.  HOSPITAL COURSE:  This 30 year old female was admitted to [facility] for abdominal pain and spotting.  She was found to have some mild vaginal bleeding, likely to have PID in this early pregnancy.  She was felt to have a viable pregnancy during her hospitalization with complicated cervicitis and PID.  She was treated with IV antibiotics for 48 hours, after which time the patient became very disruptive, inappropriate with staff, and started screaming and yelling at her family members.  She left the hospital against medical advice, signed out AMA.  We encouraged her to follow up in the [facility]U or [facility].  LABORATORY DATA:  Hepatitis B and HIV negative.  Hemoglobin 11.8.  RPR nonreactive.  Urinalysis within normal limits. Dictated by:   Maryelizabeth Rowan, M.D. Attending Physician:  Shelba Flake DD:  10/21/01 TD:  10/23/01 Job: [id number]

## 2010-09-09 ENCOUNTER — Emergency Department (HOSPITAL_COMMUNITY)
Admission: EM | Admit: 2010-09-09 | Discharge: 2010-09-09 | Disposition: A | Discharge status: Home / Self Care | Payer: Medicaid Other | Attending: Emergency Medicine | Admitting: Emergency Medicine

## 2010-09-09 ENCOUNTER — Emergency Department (HOSPITAL_COMMUNITY): Payer: Medicaid Other

## 2010-09-09 DIAGNOSIS — F319 Bipolar disorder, unspecified: Secondary | ICD-10-CM | POA: Insufficient documentation

## 2010-09-09 DIAGNOSIS — Z79899 Other long term (current) drug therapy: Secondary | ICD-10-CM | POA: Insufficient documentation

## 2010-09-09 DIAGNOSIS — F209 Schizophrenia, unspecified: Secondary | ICD-10-CM | POA: Insufficient documentation

## 2010-09-09 DIAGNOSIS — O99891 Other specified diseases and conditions complicating pregnancy: Secondary | ICD-10-CM | POA: Insufficient documentation

## 2010-09-09 DIAGNOSIS — O34599 Maternal care for other abnormalities of gravid uterus, unspecified trimester: Secondary | ICD-10-CM | POA: Insufficient documentation

## 2010-09-09 DIAGNOSIS — N83209 Unspecified ovarian cyst, unspecified side: Secondary | ICD-10-CM | POA: Insufficient documentation

## 2010-09-09 LAB — POCT I-STAT, CHEM 8
BUN: 6 mg/dL (ref 6–23)
Calcium, Ion: 1.12 mmol/L (ref 1.12–1.32)
Chloride: 104 meq/L (ref 96–112)
Creatinine, Ser: 0.7 mg/dL (ref 0.4–1.2)
Glucose, Bld: 78 mg/dL (ref 70–99)
HCT: 37 % (ref 36.0–46.0)
Hemoglobin: 12.6 g/dL (ref 12.0–15.0)
Potassium: 3.8 meq/L (ref 3.5–5.1)
Sodium: 135 meq/L (ref 135–145)
TCO2: 19 mmol/L (ref 0–100)

## 2010-09-09 LAB — URINALYSIS, ROUTINE W REFLEX MICROSCOPIC
Bilirubin Urine: NEGATIVE
Glucose, UA: NEGATIVE mg/dL
Hgb urine dipstick: NEGATIVE
Ketones, ur: NEGATIVE mg/dL
Nitrite: NEGATIVE
Protein, ur: NEGATIVE mg/dL
Specific Gravity, Urine: 1.026 (ref 1.005–1.030)
Urobilinogen, UA: 1 mg/dL (ref 0.0–1.0)
pH: 7 (ref 5.0–8.0)

## 2010-09-09 LAB — WET PREP, GENITAL
Trich, Wet Prep: NONE SEEN
Yeast Wet Prep HPF POC: NONE SEEN

## 2010-09-09 LAB — HCG, QUANTITATIVE, PREGNANCY: hCG, Beta Chain, Quant, S: 58037 m[IU]/mL — ABNORMAL HIGH (ref <5)

## 2010-09-09 LAB — POCT PREGNANCY, URINE: Preg Test, Ur: POSITIVE

## 2010-09-10 LAB — GC/CHLAMYDIA PROBE AMP, GENITAL
Chlamydia, DNA Probe: NEGATIVE
GC Probe Amp, Genital: NEGATIVE

## 2010-10-09 LAB — HIV ANTIBODY (ROUTINE TESTING W REFLEX): HIV: NONREACTIVE

## 2010-12-28 LAB — RAPID URINE DRUG SCREEN, HOSP PERFORMED
Cocaine: POSITIVE — AB
Opiates: NOT DETECTED
Tetrahydrocannabinol: NOT DETECTED

## 2010-12-28 LAB — CBC
HCT: 36.6
Hemoglobin: 12.4
RBC: 4.07

## 2010-12-28 LAB — BASIC METABOLIC PANEL
Calcium: 9.8
GFR calc Af Amer: >60
GFR calc non Af Amer: >60
Glucose, Bld: 81
Potassium: 4
Sodium: 136

## 2010-12-28 LAB — PREGNANCY, URINE: Preg Test, Ur: POSITIVE

## 2010-12-31 LAB — URINALYSIS, ROUTINE W REFLEX MICROSCOPIC
Bilirubin Urine: NEGATIVE
Glucose, UA: NEGATIVE
Hgb urine dipstick: NEGATIVE
Specific Gravity, Urine: 1.015
pH: 7.5

## 2010-12-31 LAB — WET PREP, GENITAL
Trich, Wet Prep: NONE SEEN
Yeast Wet Prep HPF POC: NONE SEEN

## 2010-12-31 LAB — CBC
HCT: 31.4 — ABNORMAL LOW
HCT: 36.8
Hemoglobin: 10.5 — ABNORMAL LOW
Hemoglobin: 10.7 — ABNORMAL LOW
Hemoglobin: 12.4
MCHC: 33.8
MCHC: 34.1
MCV: 90.6
Platelets: 236
Platelets: 252
RBC: 3.47 — ABNORMAL LOW
RDW: 15.3
RDW: 15.5
WBC: 14.7 — ABNORMAL HIGH

## 2010-12-31 LAB — DRUG SCREEN PANEL (SERUM)
Cocaine (Metabolite): NEGATIVE
Opiates, Blood: NEGATIVE
Phencyclidine, Serum: NEGATIVE
Propoxyphene,Serum: NEGATIVE

## 2010-12-31 LAB — GC/CHLAMYDIA PROBE AMP, GENITAL
Chlamydia, DNA Probe: NEGATIVE
GC Probe Amp, Genital: NEGATIVE

## 2011-01-07 LAB — DIFFERENTIAL
Basophils Absolute: 0
Basophils Relative: 0
Lymphocytes Relative: 21
Monocytes Relative: 1 — ABNORMAL LOW
Neutro Abs: 7.9 — ABNORMAL HIGH
Neutrophils Relative %: 77

## 2011-01-07 LAB — URINE CULTURE

## 2011-01-07 LAB — RETICULOCYTES
RBC.: 4.04
Retic Count, Absolute: 97
Retic Ct Pct: 2.4

## 2011-01-07 LAB — CBC
HCT: 38.3
Hemoglobin: 12.6
MCHC: 32.8
MCV: 93.3
Platelets: 229
RDW: 15.2

## 2011-01-07 LAB — URINALYSIS, ROUTINE W REFLEX MICROSCOPIC
Glucose, UA: NEGATIVE
Hgb urine dipstick: NEGATIVE
Nitrite: NEGATIVE
Specific Gravity, Urine: 1.021
Specific Gravity, Urine: 1.028
Urobilinogen, UA: 1
pH: 6
pH: 6.5

## 2011-01-07 LAB — POCT I-STAT, CHEM 8
BUN: 4 — ABNORMAL LOW
Chloride: 104
Potassium: 3.5
Sodium: 137
TCO2: 24

## 2011-01-07 LAB — GC/CHLAMYDIA PROBE AMP, GENITAL: Chlamydia, DNA Probe: NEGATIVE

## 2011-01-07 LAB — COMPREHENSIVE METABOLIC PANEL
Albumin: 3.7
Alkaline Phosphatase: 48
BUN: 8
Creatinine, Ser: 0.69
Glucose, Bld: 81
Potassium: 4.5
Total Protein: 6.4

## 2011-01-07 LAB — HCG, QUANTITATIVE, PREGNANCY: hCG, Beta Chain, Quant, S: 136229 — ABNORMAL HIGH

## 2011-01-07 LAB — RPR: RPR Ser Ql: NONREACTIVE

## 2011-01-07 LAB — WET PREP, GENITAL: Trich, Wet Prep: NONE SEEN

## 2011-01-08 LAB — WET PREP, GENITAL: Trich, Wet Prep: NONE SEEN

## 2011-01-08 LAB — GC/CHLAMYDIA PROBE AMP, GENITAL: GC Probe Amp, Genital: NEGATIVE

## 2011-01-09 ENCOUNTER — Encounter: Payer: Self-pay | Admitting: Advanced Practice Midwife

## 2011-01-09 ENCOUNTER — Inpatient Hospital Stay (HOSPITAL_COMMUNITY)
Admission: AD | Admit: 2011-01-09 | Discharge: 2011-01-10 | Disposition: A | Discharge status: Home / Self Care | Payer: Medicaid Other | Source: Ambulatory Visit | Attending: Obstetrics & Gynecology | Admitting: Obstetrics & Gynecology

## 2011-01-09 DIAGNOSIS — R109 Unspecified abdominal pain: Secondary | ICD-10-CM | POA: Insufficient documentation

## 2011-01-09 DIAGNOSIS — Z3490 Encounter for supervision of normal pregnancy, unspecified, unspecified trimester: Secondary | ICD-10-CM

## 2011-01-09 DIAGNOSIS — K529 Noninfective gastroenteritis and colitis, unspecified: Secondary | ICD-10-CM

## 2011-01-09 DIAGNOSIS — K5289 Other specified noninfective gastroenteritis and colitis: Secondary | ICD-10-CM | POA: Insufficient documentation

## 2011-01-09 DIAGNOSIS — O99891 Other specified diseases and conditions complicating pregnancy: Secondary | ICD-10-CM | POA: Insufficient documentation

## 2011-01-09 HISTORY — DX: Sickle-cell trait: D57.3

## 2011-01-09 HISTORY — DX: Essential (primary) hypertension: I10

## 2011-01-09 LAB — CBC
Hemoglobin: 11.7 — ABNORMAL LOW
MCH: 31.1 pg (ref 26.0–34.0)
MCHC: 33.8
MCHC: 34.3 g/dL (ref 30.0–36.0)
MCV: 87
MCV: 90.9 fL (ref 78.0–100.0)
Platelets: 175 10*3/uL (ref 150–400)
RBC: 3.98
RBC: 3.5 MIL/uL — ABNORMAL LOW (ref 3.87–5.11)
RDW: 14.2 % (ref 11.5–15.5)

## 2011-01-09 LAB — DIFFERENTIAL
Basophils Relative: 2 — ABNORMAL HIGH
Eosinophils Absolute: 0.1
Eosinophils Relative: 1
Monocytes Absolute: 0.9
Monocytes Relative: 9
Neutrophils Relative %: 61

## 2011-01-09 LAB — URINALYSIS, ROUTINE W REFLEX MICROSCOPIC
Bilirubin Urine: NEGATIVE
Ketones, ur: NEGATIVE mg/dL
Nitrite: NEGATIVE
Protein, ur: NEGATIVE mg/dL
Specific Gravity, Urine: >1.03 — ABNORMAL HIGH (ref 1.005–1.030)
Urobilinogen, UA: 1 mg/dL (ref 0.0–1.0)

## 2011-01-09 LAB — BASIC METABOLIC PANEL
CO2: 23
Chloride: 105
Creatinine, Ser: 1
GFR calc Af Amer: >60

## 2011-01-09 LAB — COMPREHENSIVE METABOLIC PANEL
ALT: 5 U/L (ref 0–35)
AST: 11 U/L (ref 0–37)
Albumin: 2.7 g/dL — ABNORMAL LOW (ref 3.5–5.2)
Calcium: 8.6 mg/dL (ref 8.4–10.5)
Creatinine, Ser: 0.47 mg/dL — ABNORMAL LOW (ref 0.50–1.10)
Sodium: 134 mEq/L — ABNORMAL LOW (ref 135–145)
Total Protein: 5.8 g/dL — ABNORMAL LOW (ref 6.0–8.3)

## 2011-01-09 LAB — D-DIMER, QUANTITATIVE: D-Dimer, Quant: <0.22

## 2011-01-09 MED ORDER — DIPHENOXYLATE-ATROPINE 2.5-0.025 MG PO TABS
2.0000 | ORAL_TABLET | Freq: Once | ORAL | Status: AC
  Administered 2011-01-10: 2 via ORAL
  Filled 2011-01-09: qty 1
  Filled 2011-01-09: qty 2

## 2011-01-09 MED ORDER — GI COCKTAIL ~~LOC~~
30.0000 mL | Freq: Once | ORAL | Status: AC
  Administered 2011-01-10: 30 mL via ORAL
  Filled 2011-01-09: qty 30

## 2011-01-09 NOTE — Progress Notes (Signed)
EFM d/ced per CNM after reviewing EFM strip

## 2011-01-09 NOTE — Progress Notes (Signed)
Georges Mouse CNM in to see pt. EFm strip reviewed.

## 2011-01-09 NOTE — Progress Notes (Signed)
Pt states n/v/d started after eating dinner this evening at 2000. Abd pain & vaginal pain started 1 hour after vomiting started. Reports positive fetal movement. Denies leaking of fluid or vaginal bleeding.

## 2011-01-09 NOTE — ED Provider Notes (Signed)
History     Chief Complaint  Patient presents with  . Abdominal Pain   HPI [id number] at [redacted]w[redacted]d. Pt came in by EMS for N/V/D. Started at 7:30 tonight after eating dinner. Has vomited 3 times since then, diarrhea 6 times. No current vomiting or diarrhea. Some upper abdominal pain and heartburn. Was given Zofran IV by EMS, feeling somewhat better now. Was having some mild low abd/pelvic pains prior to today, pains worsened after vomiting, shooting into vaginal area. Pain increases with position changes, moving from sitting to standing, walking. No contractions or vaginal bleeding.   OB History    Grav Para Term Preterm Abortions TAB SAB Ect Mult Living   10 4 4  0 5 0 5 0 0 4      Past Medical History  Diagnosis Date  . Hypertension   . Sickle cell trait     Past Surgical History  Procedure Date  . Dilation and curettage of uterus   . Plastic surgery on face   . Hemorroidectomy 2010    No family history on file.  History  Substance Use Topics  . Smoking status: Former Games developer  . Smokeless tobacco: Not on file  . Alcohol Use: No     hx drug use    Allergies: No Known Allergies  Prescriptions prior to admission  Medication Sig Dispense Refill  . metroNIDAZOLE (FLAGYL) 500 MG tablet Take 500 mg by mouth 2 (two) times daily.        . ondansetron (ZOFRAN-ODT) 4 MG disintegrating tablet Take 4 mg by mouth every 8 (eight) hours as needed. For nausea and vomiting       . prenatal vitamin w/FE, FA (PRENATAL 1 + 1) 27-1 MG TABS Take 1 tablet by mouth daily.        . promethazine (PHENERGAN) 25 MG tablet Take 25 mg by mouth every 6 (six) hours as needed. For nausea or vomiting         Review of Systems  Constitutional: Negative.  Negative for fever and chills.  HENT: Negative.   Eyes: Negative.   Respiratory: Negative.   Cardiovascular: Negative.   Gastrointestinal: Positive for heartburn, nausea, vomiting, abdominal pain and diarrhea.  Genitourinary: Negative.     Musculoskeletal: Negative.   Neurological: Negative.   Psychiatric/Behavioral: Negative.    Physical Exam   Blood pressure 122/62, pulse 88, temperature 98.4 F (36.9 C), resp. rate 18, height 5\' 2"  (1.575 m), weight 77.111 kg (170 lb), SpO2 99.00%.  Physical Exam  Constitutional: She is oriented to person, place, and time. She appears well-developed and well-nourished. No distress.  HENT:  Head: Normocephalic and atraumatic.  Neck: Normal range of motion.  Cardiovascular: Normal rate.   Respiratory: Effort normal.  GI: Soft. She exhibits no distension and no mass. There is no tenderness. There is no rebound and no guarding.  Musculoskeletal: Normal range of motion.  Neurological: She is alert and oriented to person, place, and time.  Skin: Skin is warm and dry.  Psychiatric: She has a normal mood and affect.   FHR 140, toco quiet  MAU Course  Procedures  Results for orders placed during the hospital encounter of 01/09/11 (from the past 24 hour(s))  CBC     Status: Abnormal   Collection Time   01/09/11 11:10 PM      Component Value Range   WBC 12.4 (*) 4.0 - 10.5 (K/uL)   RBC 3.50 (*) 3.87 - 5.11 (MIL/uL)   Hemoglobin 10.9 (*) 12.0 -  15.0 (g/dL)   HCT 16.1 (*) 09.6 - 46.0 (%)   MCV 90.9  78.0 - 100.0 (fL)   MCH 31.1  26.0 - 34.0 (pg)   MCHC 34.3  30.0 - 36.0 (g/dL)   RDW 04.5  40.9 - 81.1 (%)   Platelets 175  150 - 400 (K/uL)  COMPREHENSIVE METABOLIC PANEL     Status: Abnormal   Collection Time   01/09/11 11:10 PM      Component Value Range   Sodium 134 (*) 135 - 145 (mEq/L)   Potassium 3.6  3.5 - 5.1 (mEq/L)   Chloride 103  96 - 112 (mEq/L)   CO2 25  19 - 32 (mEq/L)   Glucose, Bld 101 (*) 70 - 99 (mg/dL)   BUN 7  6 - 23 (mg/dL)   Creatinine, Ser 9.14 (*) 0.50 - 1.10 (mg/dL)   Calcium 8.6  8.4 - 78.2 (mg/dL)   Total Protein 5.8 (*) 6.0 - 8.3 (g/dL)   Albumin 2.7 (*) 3.5 - 5.2 (g/dL)   AST 11  0 - 37 (U/L)   ALT 5  0 - 35 (U/L)   Alkaline Phosphatase 50  39 -  117 (U/L)   Total Bilirubin 0.5  0.3 - 1.2 (mg/dL)   GFR calc non Af Amer >90  >90 (mL/min)   GFR calc Af Amer >90  >90 (mL/min)  URINALYSIS, ROUTINE W REFLEX MICROSCOPIC     Status: Abnormal   Collection Time   01/09/11 11:35 PM      Component Value Range   Color, Urine YELLOW  YELLOW    Appearance CLEAR  CLEAR    Specific Gravity, Urine >1.030 (*) 1.005 - 1.030    pH 6.0  5.0 - 8.0    Glucose, UA NEGATIVE  NEGATIVE (mg/dL)   Hgb urine dipstick NEGATIVE  NEGATIVE    Bilirubin Urine NEGATIVE  NEGATIVE    Ketones, ur NEGATIVE  NEGATIVE (mg/dL)   Protein, ur NEGATIVE  NEGATIVE (mg/dL)   Urobilinogen, UA 1.0  0.0 - 1.0 (mg/dL)   Nitrite NEGATIVE  NEGATIVE    Leukocytes, UA NEGATIVE  NEGATIVE    Pt feeling much better after GI cocktail and Lomotil. Tolerating juice and requesting crackers.   Assessment and Plan  30 y.o. [id number] at [redacted]w[redacted]d Gastroenteritis - rx lomotil, has phenergan and zofran at home Round ligament pain Rev'd precuations F/U as scheduled with Dr. Clearance Coots next week  [facility] 01/10/2011, 12:46 AM

## 2011-01-10 MED ORDER — DIPHENOXYLATE-ATROPINE 2.5-0.025 MG PO TABS: 1.0000 | ORAL_TABLET | Freq: Four times a day (QID) | ORAL | Status: AC | PRN

## 2011-01-10 NOTE — Progress Notes (Signed)
Written and verbal d/c instructions given and understanding voiced. Pt states feels much better

## 2011-01-10 NOTE — ED Notes (Signed)
Pt up to BR earlier and states just voided, no diarrhea. Has gummy candy requesting to eat. Suggested to let GI cocktail work alittle first.

## 2011-01-10 NOTE — ED Notes (Signed)
Unable to get ride home. Taxi voucher used for pt and significant other. SL removed from [facility] with intact cath before d/c home.

## 2011-01-14 LAB — URINALYSIS, ROUTINE W REFLEX MICROSCOPIC
Glucose, UA: NEGATIVE
Ketones, ur: 15 — AB
Nitrite: NEGATIVE
Protein, ur: NEGATIVE
pH: 6

## 2011-01-14 LAB — WET PREP, GENITAL: Trich, Wet Prep: NONE SEEN

## 2011-01-14 LAB — PREGNANCY, URINE: Preg Test, Ur: NEGATIVE

## 2011-01-14 LAB — GC/CHLAMYDIA PROBE AMP, GENITAL: Chlamydia, DNA Probe: NEGATIVE

## 2011-01-15 LAB — URINALYSIS, ROUTINE W REFLEX MICROSCOPIC
Glucose, UA: NEGATIVE
Leukocytes, UA: NEGATIVE
Protein, ur: NEGATIVE
pH: 6.5

## 2011-01-15 LAB — CBC
HCT: 34.1 — ABNORMAL LOW
Hemoglobin: 11.5 — ABNORMAL LOW
MCHC: 33.8
MCV: 87.7
RBC: 3.88
RDW: 15

## 2011-01-15 LAB — GC/CHLAMYDIA PROBE AMP, GENITAL
Chlamydia, DNA Probe: POSITIVE — AB
GC Probe Amp, Genital: NEGATIVE

## 2011-01-15 LAB — RPR: RPR Ser Ql: NONREACTIVE

## 2011-01-15 LAB — URINE MICROSCOPIC-ADD ON

## 2011-01-21 LAB — CBC
HCT: 33.3 — ABNORMAL LOW
Hemoglobin: 11.2 — ABNORMAL LOW
MCHC: 33.5
MCV: 91.5
MCV: 93.2
Platelets: 251
RBC: 3.57 — ABNORMAL LOW
RDW: 15 — ABNORMAL HIGH
WBC: 15.4 — ABNORMAL HIGH

## 2011-01-21 LAB — RPR: RPR Ser Ql: NONREACTIVE

## 2011-01-21 LAB — POCT URINALYSIS DIP (DEVICE)
Bilirubin Urine: NEGATIVE
Glucose, UA: NEGATIVE
Hgb urine dipstick: NEGATIVE
Ketones, ur: NEGATIVE
Nitrite: NEGATIVE

## 2011-01-21 LAB — HEMOGLOBIN AND HEMATOCRIT, BLOOD: Hemoglobin: 11.1 — ABNORMAL LOW

## 2011-01-21 LAB — RAPID URINE DRUG SCREEN, HOSP PERFORMED
Barbiturates: NOT DETECTED
Benzodiazepines: NOT DETECTED

## 2011-01-22 LAB — POCT URINALYSIS DIP (DEVICE)
Bilirubin Urine: NEGATIVE
Glucose, UA: NEGATIVE
Hgb urine dipstick: NEGATIVE
Ketones, ur: NEGATIVE
Nitrite: NEGATIVE

## 2011-01-23 LAB — URINE MICROSCOPIC-ADD ON

## 2011-01-23 LAB — CULTURE, ROUTINE-ABSCESS: Gram Stain: NONE SEEN

## 2011-01-23 LAB — URINALYSIS, ROUTINE W REFLEX MICROSCOPIC
Hgb urine dipstick: NEGATIVE
Ketones, ur: 15 — AB
Nitrite: NEGATIVE
Urobilinogen, UA: 2 — ABNORMAL HIGH

## 2011-03-04 ENCOUNTER — Emergency Department (HOSPITAL_COMMUNITY)
Admission: EM | Admit: 2011-03-04 | Discharge: 2011-03-04 | Disposition: A | Discharge status: Home / Self Care | Payer: Medicaid Other | Attending: Emergency Medicine | Admitting: Emergency Medicine

## 2011-03-04 ENCOUNTER — Encounter (HOSPITAL_COMMUNITY): Payer: Self-pay | Admitting: *Deleted

## 2011-03-04 ENCOUNTER — Emergency Department (HOSPITAL_COMMUNITY): Payer: Medicaid Other

## 2011-03-04 DIAGNOSIS — R05 Cough: Secondary | ICD-10-CM | POA: Insufficient documentation

## 2011-03-04 DIAGNOSIS — D573 Sickle-cell trait: Secondary | ICD-10-CM | POA: Insufficient documentation

## 2011-03-04 DIAGNOSIS — Z79899 Other long term (current) drug therapy: Secondary | ICD-10-CM | POA: Insufficient documentation

## 2011-03-04 DIAGNOSIS — M25559 Pain in unspecified hip: Secondary | ICD-10-CM | POA: Insufficient documentation

## 2011-03-04 DIAGNOSIS — Z9889 Other specified postprocedural states: Secondary | ICD-10-CM | POA: Insufficient documentation

## 2011-03-04 DIAGNOSIS — O99891 Other specified diseases and conditions complicating pregnancy: Secondary | ICD-10-CM | POA: Insufficient documentation

## 2011-03-04 DIAGNOSIS — S7000XA Contusion of unspecified hip, initial encounter: Secondary | ICD-10-CM

## 2011-03-04 DIAGNOSIS — I10 Essential (primary) hypertension: Secondary | ICD-10-CM | POA: Insufficient documentation

## 2011-03-04 DIAGNOSIS — Z331 Pregnant state, incidental: Secondary | ICD-10-CM

## 2011-03-04 DIAGNOSIS — R059 Cough, unspecified: Secondary | ICD-10-CM | POA: Insufficient documentation

## 2011-03-04 DIAGNOSIS — J040 Acute laryngitis: Secondary | ICD-10-CM | POA: Insufficient documentation

## 2011-03-04 DIAGNOSIS — W19XXXA Unspecified fall, initial encounter: Secondary | ICD-10-CM | POA: Insufficient documentation

## 2011-03-04 NOTE — ED Notes (Signed)
womens staff at bedside

## 2011-03-04 NOTE — ED Notes (Signed)
Assisted pt to get dressed.

## 2011-03-04 NOTE — Progress Notes (Signed)
Arrived at pt bedside trauma room 17 with pt on stretcher. Doppler used to find FHR at 145. Pt c/o of falling from a basket about 2 feet and landed on left side (abdomen and hip). C/o left hip and side pain. Dr. Lynelle Doctor at bedside to assess. Pt states she gets prenatal care at [facility] with Dr. Coral Ceo.

## 2011-03-04 NOTE — ED Provider Notes (Signed)
History     CSN: [id number] Arrival date & time: 03/04/2011 12:51 PM   First MD Initiated Contact with Patient 03/04/11 1259     HPI The patient was standing up and had a fall. She is [redacted] weeks pregnant and landed on the left side of her abdomen. Patient is also complaining some pain in her left hip. Patient denies any vaginal bleeding or discharge. She has not noticed any contractions. Patient is G. 10 P4.  Patient denies any numbness or weakness. She denies any chest pain or shortness of breath. She does not she's had a little bit of cough recently and having some laryngitis symptoms. Past Medical History  Diagnosis Date  . Hypertension   . Sickle cell trait     Past Surgical History  Procedure Date  . Dilation and curettage of uterus   . Plastic surgery on face   . Hemorroidectomy 2010    History reviewed. No pertinent family history.  History  Substance Use Topics  . Smoking status: Former Games developer  . Smokeless tobacco: Not on file  . Alcohol Use: No     hx drug use    OB History    Grav Para Term Preterm Abortions TAB SAB Ect Mult Living   10 4 4  0 5 0 5 0 0 4      Review of Systems  All other systems reviewed and are negative.    Allergies  Review of patient's allergies indicates no known allergies.  Home Medications   Current Outpatient Rx  Name Route Sig Dispense Refill  . METRONIDAZOLE 500 MG PO TABS Oral Take 500 mg by mouth 2 (two) times daily.      Marland Kitchen ONDANSETRON 4 MG PO TBDP Oral Take 4 mg by mouth every 8 (eight) hours as needed. For nausea and vomiting     . PRENATAL PLUS 27-1 MG PO TABS Oral Take 1 tablet by mouth daily.      Marland Kitchen PROMETHAZINE HCL 25 MG PO TABS Oral Take 25 mg by mouth every 6 (six) hours as needed. For nausea or vomiting       BP 118/70  Pulse 87  Temp(Src) 98.1 F (36.7 C) (Oral)  Resp 16  SpO2 99%  Physical Exam  Nursing note and vitals reviewed. Constitutional: She appears well-developed and well-nourished. No distress.    HENT:  Head: Normocephalic and atraumatic. Head is without raccoon's eyes and without Battle's sign.  Right Ear: External ear normal.  Left Ear: External ear normal.  Eyes: Lids are normal. Right eye exhibits no discharge. Right conjunctiva has no hemorrhage. Left conjunctiva has no hemorrhage.  Neck: No spinous process tenderness present. No tracheal deviation and no edema present.  Cardiovascular: Normal rate, regular rhythm and normal heart sounds.   Pulmonary/Chest: Effort normal and breath sounds normal. No stridor. No respiratory distress. She exhibits no tenderness, no crepitus and no deformity.  Abdominal: Soft. Normal appearance and bowel sounds are normal. She exhibits no distension. Mass: gravid abdomen. There is no tenderness. There is no rebound and no guarding.       Negative for seat belt sign  Musculoskeletal:       Cervical back: She exhibits no tenderness, no swelling and no deformity.       Thoracic back: She exhibits no tenderness, no swelling and no deformity.       Lumbar back: She exhibits no tenderness and no swelling.       Pelvis stable, tenderness palpation left hip and  pain with range of motion, no shortening, distal neurovascular intact  Neurological: She is alert. She has normal strength. No sensory deficit. She exhibits normal muscle tone. GCS eye subscore is 4. GCS verbal subscore is 5. GCS motor subscore is 6.       Able to move all extremities, sensation intact throughout  Skin: She is not diaphoretic.  Psychiatric: She has a normal mood and affect. Her speech is normal and behavior is normal.    ED Course  Procedures (including critical care time)  Labs Reviewed - No data to display Dg Pelvis 1-2 Views  03/04/2011  *RADIOLOGY REPORT*  Clinical Data: Fall, hip pain, pregnant [redacted] weeks  PELVIS - 1-2 VIEW  Comparison: None.  Findings: Hips appear symmetric, intact and located.  No displaced fracture noted.  The pelvis demonstrates symmetric SI joints.  Slight diastasis of the symphysis pubis measuring 15 mm.  Partial imaging of the fetal skeleton.  IMPRESSION: No acute fracture demonstrated.  Original Report Authenticated By: Judie Petit. Ruel Favors, M.D.      MDM  Old records have been reviewed. His laboratory results on file indicating the patient is a positive. Dr. Tamela Oddi was contacted by the [facility] nurse. Patient can be monitored for 2 hours here in the emergency room. If that is all negative then she may be safely discharged at that time.  3:07 PM patient has been monitored in the ED. She remained stable. There is no evidence of contractions. Her hip x-ray does not show any signs of fracture. At this point patient can be safely discharged.  Patient has requested some cold medications and I explained to her that these should not be taken when she's pregnant.  Diagnosis: Fall, pregnancy, contusion left hip     Celene Kras, MD 03/04/11 [phone]

## 2011-03-04 NOTE — Progress Notes (Signed)
Pt states she wants to be transferred to a hospital that will admit her for congestion. That she is "upset and angry." Pt encouraged to talk about feelings and discussed treatments to help with congestion. Pt calming down. EFM removed.

## 2011-03-04 NOTE — ED Notes (Signed)
Helped to use bedpan.

## 2011-03-04 NOTE — Progress Notes (Signed)
EFM applied. Paper strip started.

## 2011-03-04 NOTE — Progress Notes (Signed)
Pt c/o chest congestion. Pt has called OB office and they said for the ED to call the OB. Dr. Tamela Oddi paged.

## 2011-03-04 NOTE — Progress Notes (Signed)
OB RR RN called regarding a Level 2 trauma patient at 31 weeks s/p fall.

## 2011-03-04 NOTE — Progress Notes (Addendum)
Pt received discharge instructions regarding Fall. No return phone call from Dr. Tamela Oddi regarding pt new request about congestion. Dr. Lynelle Doctor aware of pt c/o congestion.

## 2011-03-04 NOTE — ED Notes (Signed)
Family at beside. Pt talking on phone. Remains on monitor.

## 2011-03-04 NOTE — ED Notes (Signed)
EDP at bedside  

## 2011-04-09 NOTE — L&D Delivery Note (Signed)
Delivery Note At 12:18 AM a viable female was delivered via  (Presentation: Occiput anterior  ).     Placenta status: intact, 3VC--> Pathology.  Anesthesia:  Epidural Episiotomy: None Lacerations: None Suture Repair: N/A Est. Blood Loss (mL): 200 ml  Mom to postpartum.  Baby to nursery-stable.  JACKSON-MOORE,Jasmene Goswami A 04/28/2011, 12:30 AM

## 2011-04-23 ENCOUNTER — Inpatient Hospital Stay (HOSPITAL_COMMUNITY)
Admission: AD | Admit: 2011-04-23 | Discharge: 2011-04-23 | Disposition: A | Discharge status: Home / Self Care | Payer: Medicaid Other | Source: Ambulatory Visit | Attending: Obstetrics | Admitting: Obstetrics

## 2011-04-23 DIAGNOSIS — O479 False labor, unspecified: Secondary | ICD-10-CM | POA: Insufficient documentation

## 2011-04-23 MED ORDER — PANTOPRAZOLE SODIUM 40 MG PO TBEC
40.0000 mg | DELAYED_RELEASE_TABLET | Freq: Every day | ORAL | Status: AC
  Administered 2011-04-23: 40 mg via ORAL
  Filled 2011-04-23: qty 1

## 2011-04-23 MED ORDER — CALCIUM CARBONATE ANTACID 500 MG PO CHEW
200.0000 mg | CHEWABLE_TABLET | Freq: Once | ORAL | Status: AC
  Administered 2011-04-23: 200 mg via ORAL
  Filled 2011-04-23: qty 1

## 2011-04-23 NOTE — Progress Notes (Signed)
Pt  C/o of pink discharge ,pelvic pressure and contrctions-states had sex last night-

## 2011-04-23 NOTE — ED Notes (Signed)
A bus pass was given to the pt and the FOB-states she has no car and knows no one to come get her and has no money

## 2011-04-27 ENCOUNTER — Encounter (HOSPITAL_COMMUNITY): Payer: Self-pay | Admitting: Anesthesiology

## 2011-04-27 ENCOUNTER — Inpatient Hospital Stay (HOSPITAL_COMMUNITY)
Admission: AD | Admit: 2011-04-27 | Discharge: 2011-04-30 | DRG: 775 | Disposition: A | Discharge status: Home / Self Care | Payer: Medicaid Other | Source: Ambulatory Visit | Attending: Obstetrics & Gynecology | Admitting: Obstetrics & Gynecology

## 2011-04-27 ENCOUNTER — Encounter (HOSPITAL_COMMUNITY): Payer: Self-pay | Admitting: *Deleted

## 2011-04-27 ENCOUNTER — Encounter (HOSPITAL_COMMUNITY): Payer: Self-pay | Admitting: Obstetrics & Gynecology

## 2011-04-27 ENCOUNTER — Inpatient Hospital Stay (HOSPITAL_COMMUNITY): Payer: Medicaid Other | Admitting: Anesthesiology

## 2011-04-27 HISTORY — DX: Unspecified infectious disease: B99.9

## 2011-04-27 HISTORY — DX: Other problems related to lifestyle: Z72.89

## 2011-04-27 LAB — CBC
MCH: 29.3 pg (ref 26.0–34.0)
MCHC: 33.5 g/dL (ref 30.0–36.0)
MCV: 87.4 fL (ref 78.0–100.0)
Platelets: 182 10*3/uL (ref 150–400)
RDW: 14.8 % (ref 11.5–15.5)
WBC: 12.4 10*3/uL — ABNORMAL HIGH (ref 4.0–10.5)

## 2011-04-27 MED ORDER — FENTANYL 2.5 MCG/ML BUPIVACAINE 1/10 % EPIDURAL INFUSION (WH - ANES)
INTRAMUSCULAR | Status: DC | PRN
  Administered 2011-04-27: 14 mL/h via EPIDURAL

## 2011-04-27 MED ORDER — LACTATED RINGERS IV SOLN
500.0000 mL | Freq: Once | INTRAVENOUS | Status: AC
  Administered 2011-04-27: 500 mL via INTRAVENOUS

## 2011-04-27 MED ORDER — LACTATED RINGERS IV SOLN
INTRAVENOUS | Status: DC
  Administered 2011-04-27 (×2): 125 mL/h via INTRAVENOUS

## 2011-04-27 MED ORDER — EPHEDRINE 5 MG/ML INJ
10.0000 mg | INTRAVENOUS | Status: DC | PRN
  Filled 2011-04-27: qty 4

## 2011-04-27 MED ORDER — ACETAMINOPHEN 325 MG PO TABS: 650.0000 mg | ORAL_TABLET | ORAL | Status: DC | PRN

## 2011-04-27 MED ORDER — FLEET ENEMA 7-19 GM/118ML RE ENEM: 1.0000 | ENEMA | RECTAL | Status: DC | PRN

## 2011-04-27 MED ORDER — EPHEDRINE 5 MG/ML INJ: 10.0000 mg | INTRAVENOUS | Status: DC | PRN

## 2011-04-27 MED ORDER — CITRIC ACID-SODIUM CITRATE 334-500 MG/5ML PO SOLN
30.0000 mL | ORAL | Status: DC | PRN
  Administered 2011-04-27: 30 mL via ORAL
  Filled 2011-04-27: qty 15

## 2011-04-27 MED ORDER — OXYCODONE-ACETAMINOPHEN 5-325 MG PO TABS: 2.0000 | ORAL_TABLET | ORAL | Status: DC | PRN

## 2011-04-27 MED ORDER — IBUPROFEN 600 MG PO TABS
600.0000 mg | ORAL_TABLET | Freq: Four times a day (QID) | ORAL | Status: DC | PRN
  Administered 2011-04-28: 600 mg via ORAL
  Filled 2011-04-27: qty 1

## 2011-04-27 MED ORDER — LACTATED RINGERS IV SOLN
500.0000 mL | INTRAVENOUS | Status: DC | PRN
  Administered 2011-04-27: 500 mL via INTRAVENOUS

## 2011-04-27 MED ORDER — BUTORPHANOL TARTRATE 2 MG/ML IJ SOLN
INTRAMUSCULAR | Status: AC
  Administered 2011-04-27: 1 mg via INTRAVENOUS
  Filled 2011-04-27: qty 1

## 2011-04-27 MED ORDER — PHENYLEPHRINE 40 MCG/ML (10ML) SYRINGE FOR IV PUSH (FOR BLOOD PRESSURE SUPPORT): 80.0000 ug | PREFILLED_SYRINGE | INTRAVENOUS | Status: DC | PRN

## 2011-04-27 MED ORDER — LIDOCAINE HCL 1.5 % IJ SOLN
INTRAMUSCULAR | Status: DC | PRN
  Administered 2011-04-27 (×2): 5 mL via EPIDURAL

## 2011-04-27 MED ORDER — DIPHENHYDRAMINE HCL 50 MG/ML IJ SOLN: 12.5000 mg | INTRAMUSCULAR | Status: DC | PRN

## 2011-04-27 MED ORDER — FENTANYL 2.5 MCG/ML BUPIVACAINE 1/10 % EPIDURAL INFUSION (WH - ANES)
14.0000 mL/h | INTRAMUSCULAR | Status: DC
  Administered 2011-04-27: 14 mL/h via EPIDURAL
  Filled 2011-04-27 (×2): qty 60

## 2011-04-27 MED ORDER — ONDANSETRON HCL 4 MG/2ML IJ SOLN
4.0000 mg | Freq: Four times a day (QID) | INTRAMUSCULAR | Status: DC | PRN
  Administered 2011-04-27: 4 mg via INTRAVENOUS
  Filled 2011-04-27: qty 2

## 2011-04-27 MED ORDER — OXYTOCIN 20 UNITS IN LACTATED RINGERS INFUSION - SIMPLE
125.0000 mL/h | Freq: Once | INTRAVENOUS | Status: AC
  Administered 2011-04-28: 999 mL/h via INTRAVENOUS

## 2011-04-27 MED ORDER — LIDOCAINE HCL (PF) 1 % IJ SOLN
30.0000 mL | INTRAMUSCULAR | Status: DC | PRN
  Filled 2011-04-27: qty 30

## 2011-04-27 MED ORDER — OXYTOCIN BOLUS FROM INFUSION
500.0000 mL | Freq: Once | INTRAVENOUS | Status: DC
  Filled 2011-04-27: qty 500
  Filled 2011-04-27: qty 1000

## 2011-04-27 MED ORDER — PHENYLEPHRINE 40 MCG/ML (10ML) SYRINGE FOR IV PUSH (FOR BLOOD PRESSURE SUPPORT)
80.0000 ug | PREFILLED_SYRINGE | INTRAVENOUS | Status: DC | PRN
  Filled 2011-04-27: qty 5

## 2011-04-27 MED ORDER — BUTORPHANOL TARTRATE 2 MG/ML IJ SOLN
1.0000 mg | INTRAMUSCULAR | Status: DC | PRN
  Administered 2011-04-27: 1 mg via INTRAVENOUS

## 2011-04-27 NOTE — Progress Notes (Signed)
Sitting on toilet.  States she has urge to push.  Unable to get up.  SVE while sitting on toilet.  7.5/80/-1 c BBOW. - Called for delivery table to be set up.

## 2011-04-27 NOTE — Progress Notes (Signed)
Report to [facility] RN by K. Wilson Charity fundraiser, to room 166 via w/c

## 2011-04-27 NOTE — Anesthesia Preprocedure Evaluation (Signed)
Anesthesia Evaluation  Patient identified by MRN, date of birth, ID band Patient awake    Reviewed: Allergy & Precautions, H&P , NPO status , Patient's Chart, lab work & pertinent test results  Airway Mallampati: I TM Distance: >3 FB Neck ROM: full    Dental No notable dental hx.    Pulmonary neg pulmonary ROS,    Pulmonary exam normal       Cardiovascular     Neuro/Psych Negative Neurological ROS  Negative Psych ROS   GI/Hepatic negative GI ROS, Neg liver ROS,   Endo/Other  Negative Endocrine ROS  Renal/GU negative Renal ROS  Genitourinary negative   Musculoskeletal negative musculoskeletal ROS (+)   Abdominal Normal abdominal exam  (+)   Peds negative pediatric ROS (+)  Hematology negative hematology ROS (+)   Anesthesia Other Findings   Reproductive/Obstetrics (+) Pregnancy                           Anesthesia Physical Anesthesia Plan  ASA: II  Anesthesia Plan: Epidural   Post-op Pain Management:    Induction:   Airway Management Planned:   Additional Equipment:   Intra-op Plan:   Post-operative Plan:   Informed Consent: I have reviewed the patients History and Physical, chart, labs and discussed the procedure including the risks, benefits and alternatives for the proposed anesthesia with the patient or authorized representative who has indicated his/her understanding and acceptance.     Plan Discussed with:   Anesthesia Plan Comments:         Anesthesia Quick Evaluation  

## 2011-04-27 NOTE — H&P (Signed)
[name] is Crane 31 y.o. female presenting for contractions. Maternal Medical History:  Reason for admission: Reason for admission: contractions.  Reason for Admission:   nauseaContractions: Frequency: regular.   Perceived severity is strong.    Fetal activity: Perceived fetal activity is normal.    Prenatal complications: no prenatal complications   OB History    Grav Para Term Preterm Abortions TAB SAB Ect Mult Living   10 4 4  0 5 0 5 0 0 4     Past Medical History  Diagnosis Date  . Hypertension   . Sickle cell trait   . Deliberate self-cutting   . Infection     MRSA in 1995, negative since   Past Surgical History  Procedure Date  . Dilation and curettage of uterus   . Plastic surgery on face   . Hemorroidectomy 2010   Family History: family history is negative for Anesthesia problems. Social History:  reports that she has quit smoking. She has never used smokeless tobacco. She reports that she does not drink alcohol or use illicit drugs.  Review of Systems  Constitutional: Negative for fever.  Eyes: Negative for blurred vision.  Respiratory: Negative for shortness of breath.   Gastrointestinal: Negative for nausea and vomiting.  Skin: Negative for rash.  Neurological: Negative for headaches.    Dilation: 6 Effacement (%): 100 Station: 0 Exam by:: Dr. Tamela Oddi Blood pressure 110/70, pulse 77, temperature 98.4 F (36.9 C), temperature source Oral, resp. rate 18, SpO2 99.00%. Maternal Exam:  Abdomen: Fetal presentation: vertex  Introitus: not evaluated.   Pelvis: adequate for delivery.   Cervix: Cervix evaluated by digital exam.     Fetal Exam Fetal Monitor Review: Variability: moderate (6-25 bpm).   Pattern: accelerations present and no decelerations.    Fetal State Assessment: Category I - tracings are normal.     Physical Exam  Constitutional: She appears well-developed.  HENT:  Head: Normocephalic.  Neck: Neck supple. No thyromegaly  present.  Cardiovascular: Normal rate and regular rhythm.   Respiratory: Breath sounds normal.  GI: Soft. Bowel sounds are normal.  Skin: No rash noted.    Prenatal labs: ABO, Rh:   Antibody:   Rubella: Immune (08/07 0000) RPR: Nonreactive (07/03 0000)  HBsAg:    HIV: Non-reactive (07/03 0000)  GBS:     Assessment/Plan: Multipara at term, active labor, Category 1 FHT Admit, anticipate an NSVD   [name],[name] 04/27/2011, 10:11 PM

## 2011-04-27 NOTE — Progress Notes (Signed)
Arrived to Rm 166 via W/C.  Accompanied by OB Tech & FOB.  Started vomiting immediately upon arrival.  Asking for pain & nausea meds.  Quickly explained that there she must be fully admitted & have an IV first. Changed her gown & cleaned her up.

## 2011-04-27 NOTE — Anesthesia Procedure Notes (Signed)
Epidural Patient location during procedure: OB Start time: 04/27/2011 8:27 PM End time: 04/27/2011 8:32 PM Reason for block: procedure for pain  Staffing Anesthesiologist: Sandrea Hughs Performed by: anesthesiologist   Preanesthetic Checklist Completed: patient identified, site marked, surgical consent, pre-op evaluation, timeout performed, IV checked, risks and benefits discussed and monitors and equipment checked  Epidural Patient position: sitting Prep: site prepped and draped and DuraPrep Patient monitoring: continuous pulse ox and blood pressure Approach: midline Injection technique: LOR air  Needle:  Needle type: Tuohy  Needle gauge: 17 G Needle length: 9 cm Needle insertion depth: 5 cm cm Catheter type: closed end flexible Catheter size: 19 Gauge Catheter at skin depth: 10 cm Test dose: negative and 1.5% lidocaine  Assessment Sensory level: T8 Events: blood not aspirated, injection not painful, no injection resistance, negative IV test and no paresthesia

## 2011-04-28 ENCOUNTER — Encounter (HOSPITAL_COMMUNITY): Payer: Self-pay | Admitting: Family Medicine

## 2011-04-28 ENCOUNTER — Other Ambulatory Visit: Payer: Self-pay | Admitting: Obstetrics & Gynecology

## 2011-04-28 LAB — CBC
HCT: 33.8 % — ABNORMAL LOW (ref 36.0–46.0)
Hemoglobin: 11.4 g/dL — ABNORMAL LOW (ref 12.0–15.0)
RBC: 3.83 MIL/uL — ABNORMAL LOW (ref 3.87–5.11)

## 2011-04-28 MED ORDER — BENZOCAINE-MENTHOL 20-0.5 % EX AERO: 1.0000 "application " | INHALATION_SPRAY | CUTANEOUS | Status: DC | PRN

## 2011-04-28 MED ORDER — OXYCODONE-ACETAMINOPHEN 5-325 MG PO TABS
1.0000 | ORAL_TABLET | ORAL | Status: DC | PRN
  Administered 2011-04-28 (×2): 2 via ORAL
  Administered 2011-04-28: 1 via ORAL
  Administered 2011-04-28 – 2011-04-29 (×3): 2 via ORAL
  Administered 2011-04-29 (×2): 1 via ORAL
  Administered 2011-04-30 (×2): 2 via ORAL
  Filled 2011-04-28 (×4): qty 2
  Filled 2011-04-28 (×2): qty 1
  Filled 2011-04-28: qty 2
  Filled 2011-04-28: qty 1
  Filled 2011-04-28 (×2): qty 2

## 2011-04-28 MED ORDER — PRENATAL MULTIVITAMIN CH
1.0000 | ORAL_TABLET | Freq: Every day | ORAL | Status: DC
  Administered 2011-04-28 – 2011-04-30 (×3): 1 via ORAL
  Filled 2011-04-28 (×3): qty 1

## 2011-04-28 MED ORDER — MAGNESIUM HYDROXIDE 400 MG/5ML PO SUSP: 30.0000 mL | ORAL | Status: DC | PRN

## 2011-04-28 MED ORDER — IBUPROFEN 600 MG PO TABS
600.0000 mg | ORAL_TABLET | Freq: Four times a day (QID) | ORAL | Status: DC
  Administered 2011-04-28 – 2011-04-30 (×9): 600 mg via ORAL
  Filled 2011-04-28 (×10): qty 1

## 2011-04-28 MED ORDER — TETANUS-DIPHTH-ACELL PERTUSSIS 5-2.5-18.5 LF-MCG/0.5 IM SUSP: 0.5000 mL | Freq: Once | INTRAMUSCULAR | Status: DC

## 2011-04-28 MED ORDER — ZOLPIDEM TARTRATE 5 MG PO TABS: 5.0000 mg | ORAL_TABLET | Freq: Every evening | ORAL | Status: DC | PRN

## 2011-04-28 MED ORDER — DIBUCAINE 1 % RE OINT: 1.0000 "application " | TOPICAL_OINTMENT | RECTAL | Status: DC | PRN

## 2011-04-28 MED ORDER — INFLUENZA VIRUS VACC SPLIT PF IM SUSP: 0.5000 mL | INTRAMUSCULAR | Status: DC | PRN

## 2011-04-28 MED ORDER — ONDANSETRON HCL 4 MG PO TABS
4.0000 mg | ORAL_TABLET | ORAL | Status: DC | PRN
  Administered 2011-04-28 (×2): 4 mg via ORAL
  Filled 2011-04-28 (×4): qty 1

## 2011-04-28 MED ORDER — DIPHENHYDRAMINE HCL 25 MG PO CAPS: 25.0000 mg | ORAL_CAPSULE | Freq: Four times a day (QID) | ORAL | Status: DC | PRN

## 2011-04-28 MED ORDER — ONDANSETRON 4 MG PO TBDP
4.0000 mg | ORAL_TABLET | Freq: Four times a day (QID) | ORAL | Status: DC | PRN
  Administered 2011-04-28 – 2011-04-29 (×2): 4 mg via ORAL
  Filled 2011-04-28 (×2): qty 1

## 2011-04-28 MED ORDER — MEDROXYPROGESTERONE ACETATE 150 MG/ML IM SUSP
150.0000 mg | INTRAMUSCULAR | Status: AC | PRN
  Administered 2011-04-30: 150 mg via INTRAMUSCULAR
  Filled 2011-04-28: qty 1

## 2011-04-28 MED ORDER — SENNOSIDES-DOCUSATE SODIUM 8.6-50 MG PO TABS
2.0000 | ORAL_TABLET | Freq: Every day | ORAL | Status: DC
  Administered 2011-04-29: 2 via ORAL

## 2011-04-28 MED ORDER — MEASLES, MUMPS & RUBELLA VAC ~~LOC~~ INJ
0.5000 mL | INJECTION | Freq: Once | SUBCUTANEOUS | Status: DC
  Filled 2011-04-28: qty 0.5

## 2011-04-28 MED ORDER — WITCH HAZEL-GLYCERIN EX PADS: 1.0000 "application " | MEDICATED_PAD | CUTANEOUS | Status: DC | PRN

## 2011-04-28 MED ORDER — LANOLIN HYDROUS EX OINT: TOPICAL_OINTMENT | CUTANEOUS | Status: DC | PRN

## 2011-04-28 MED ORDER — FERROUS SULFATE 325 (65 FE) MG PO TABS
325.0000 mg | ORAL_TABLET | Freq: Two times a day (BID) | ORAL | Status: DC
  Administered 2011-04-29 – 2011-04-30 (×2): 325 mg via ORAL
  Filled 2011-04-28 (×4): qty 1

## 2011-04-28 MED ORDER — ONDANSETRON HCL 4 MG/2ML IJ SOLN: 4.0000 mg | INTRAMUSCULAR | Status: DC | PRN

## 2011-04-28 NOTE — Anesthesia Postprocedure Evaluation (Signed)
Anesthesia Post Note  Patient: [name]  Procedure(s) Performed: * No procedures listed *  Anesthesia type: Epidural  Patient location: Mother/Baby  Post pain: Pain level controlled  Post assessment: Post-op Vital signs reviewed  Last Vitals:  Filed Vitals:   04/28/11 0330  BP: 129/86  Pulse: 78  Temp: 36.6 C  Resp: 18    Post vital signs: Reviewed  Level of consciousness: awake  Complications: No apparent anesthesia complications

## 2011-04-28 NOTE — Progress Notes (Signed)
NSVD of a viable female.  

## 2011-04-28 NOTE — Anesthesia Postprocedure Evaluation (Signed)
  Anesthesia Post-op Note  Patient: [name]  Procedure(s) Performed: * No procedures listed *  Patient Location: Mother/Baby  Anesthesia Type: Epidural  Level of Consciousness: awake, alert  and oriented  Airway and Oxygen Therapy: Patient Spontanous Breathing  Post-op Pain: mild  Post-op Assessment: Patient's Cardiovascular Status Stable, Respiratory Function Stable, Patent Airway, No signs of Nausea or vomiting and Pain level controlled  Post-op Vital Signs: stable  Complications: No apparent anesthesia complications

## 2011-04-28 NOTE — Progress Notes (Signed)
Ms. Suares has taken doses of Zofran today for nausea and has now asked for her PRN medication to be changed to Phenergan for nausea. She is currently eating and stating that she is nauseous.  I spoke with Dr. Tamela Oddi and received an order for all emetics to be discontinued.

## 2011-04-28 NOTE — Addendum Note (Signed)
Addendum  created 04/28/11 0956 by Lincoln Brigham, CRNA   Modules edited:Charges VN, Notes Section

## 2011-04-28 NOTE — Progress Notes (Signed)
Patient ID: [name], female   DOB: [birth year], 31 y.o.   MRN: [id number] Post Partum Day 0 S/P spontaneous vaginal RH status/Rubella reviewed.  Feeding: unknown Subjective: No HA, SOB, CP, F/C, breast symptoms. Normal vaginal bleeding, no clots.     Objective: BP 101/64  Pulse 78  Temp(Src) 97.9 F (36.6 C) (Oral)  Resp 18  SpO2 99%  Breastfeeding? Unknown   Physical Exam:  General: alert Lochia: appropriate Uterine Fundus: firm DVT Evaluation: No evidence of DVT seen on physical exam. Ext: No c/c/e  Basename 04/28/11 0527 04/27/11 1929  HGB 11.4* 11.6*  HCT 33.8* 34.6*      Assessment/Plan: 31 y.o.  PPD #0 .  normal postpartum exam Continue current postpartum care  Ambulate   LOS: 1 day   JACKSON-MOORE,Tashi Band A 04/28/2011, 11:13 AM

## 2011-04-29 MED ORDER — PROMETHAZINE HCL 25 MG/ML IJ SOLN: 25.0000 mg | Freq: Four times a day (QID) | INTRAMUSCULAR | Status: DC | PRN

## 2011-04-29 MED ORDER — PROMETHAZINE HCL 25 MG PO TABS
25.0000 mg | ORAL_TABLET | Freq: Four times a day (QID) | ORAL | Status: DC | PRN
  Administered 2011-04-29 – 2011-04-30 (×3): 25 mg via ORAL
  Filled 2011-04-29 (×3): qty 1

## 2011-04-29 NOTE — Progress Notes (Signed)
Request nausea medication, MD changed nausea medication from Zofran to Phenergan po. Gave Phenergan, requesting pain medication, took 1 percocett, stated she needed 2, 1 didn't do any good. I explained that she had phenergan and she was sleeping soundly and the side effects from both medications would make her very sleepy. Stated she wanted 2 percocett her pain was 8, after evaluation of 2 percocett it was still 8. Stated it was better but couldn't give me a different number. FOB caring for baby at bedside.

## 2011-04-29 NOTE — Progress Notes (Signed)
PSYCHOSOCIAL ASSESSMENT ~ MATERNAL/CHILD  Name: [name] Age: 31  Referral Date: 04/29/11  Reason/Source: Social situation / CN  I. FAMILY/HOME ENVIRONMENT  A. Child's Legal Guardian _X__Parent(s) ___Grandparent ___Foster parent ___DSS_________________  Name: [name] DOB: // Age: 30  Address: [address] ; [place], [place] [address]  Name: [name] DOB: // Age: 23  Address: (same as above)  B. Other Household Members/Support Persons Name: Relationship: 4 children (ages 9, 4, 2 & 1) DOB ___/___/___  Name: Relationship: DOB ___/___/___  Name: Relationship: DOB ___/___/___  Name: Relationship: DOB ___/___/___  C. Other Support:  II. PSYCHOSOCIAL DATA A. Information Source _X_Patient Interview __Family Interview __Other___________ B. Financial and Community Resources __Employment:  _X_Medicaid County: [place] __Private Insurance: __Self Pay  _X_Food Stamps __WIC* may apply __Work First __Public Housing __Section 8  __Maternity Care Coordination/Child Service Coordination/Early Intervention  ___School: Grade:  __Other:  C. Cultural and Environment Information Cultural Issues Impacting Care:  III. STRENGTHS _X__Supportive family/friends  _X__Adequate Resources  ___Compliance with medical plan  _X__Home prepared for Child (including basic supplies)  ___Understanding of illness  ___Other:  RISK FACTORS AND CURRENT PROBLEMS ____No Problems Noted  History of Depression, Schizophrenia, Bipolar.  History of substance use  IV. SOCIAL WORK ASSESSMENT Sw met with pt to assess her current social situation re: mental health history and substance use history. Pt is not currently receiving any mental health treatment, as she reports she is doing well. She has not taken any medication in years and reports that she has been able to cope well. She has not "cut" since she was 13-14 years old. Pt does admit to a history of MJ use but denies any use in 1 year. She denies  other illegal substance use. Drug screening was not ordered. Pt has an open CPS case, [name] is the worker. Sw spoke to the on call CPS worker today, who contacted Hailey, who does not have any concerns about this pt's current situation. She has been involved with the family for about 9 months and plans to following upon discharge. The CPS plans to bring the pt a car seat prior to discharge. FOB is at the bedside and supportive. The told Sw that her situation has changed a lot for the better. She had baby shower last week however expressed need for additional clothing. Sw provided pt with a bundle pack. Pt identified her parents, as well as FOB's parents as supports. Sw observed the parents being attentive to the infant and bonding well. Sw will continue to follow and assist as needed until discharge.  V. SOCIAL WORK PLAN _X__No Further Intervention Required/No Barriers to Discharge  ___Psychosocial Support and Ongoing Assessment of Needs  ___Patient/Family Education:  ___Child Protective Services Report County___________ Date___/____/____  ___Information/Referral to Community Resources_________________________  ___Other:     _X_Patient Interview  __Family Interview           __Other___________  B. Event organiser __Employment: _X_Medicaid    [place]: [place]                __Private Insurance:                   __Self Pay  _X_Food Stamps   __WIC* may apply __Work First     __Public Housing     __Section 8    __Maternity Care Coordination/Child Service Coordination/Early Intervention   ___School:                                                                         Grade:  __Other:   [name] Cultural and Environment Information Cultural Issues Impacting Care:  III. STRENGTHS _X__Supportive family/friends _X__Adequate  Resources ___Compliance with medical plan _X__Home prepared for Child (including basic supplies) ___Understanding of illness      ___Other: RISK FACTORS AND CURRENT PROBLEMS         ____No Problems Noted      History of Depression, Schizophrenia, Bipolar. History of substance use                                                                                                                                                                                                                                             IV. SOCIAL WORK ASSESSMENT  Sw met with pt to assess her current social situation re: mental health history and substance use history.  Pt is not currently receiving any mental health treatment, as she reports she is doing well.  She has not taken any medication in years and reports that she has been able to cope well.    She has not "cut" since she was 17-27 years old.  Pt does admit to a history of MJ use but denies any use in 1 year.  She denies other illegal substance use.  Drug screening was not ordered.  Pt has an open CPS case, [name] is the worker.  Sw spoke to the on  call CPS worker today, who contacted [name], who does not have any concerns about this pt's current situation.  She has been involved with the family for about 9 months and plans to following upon discharge.  The CPS plans to bring the pt a car seat prior to discharge.  FOB is at the bedside and supportive.  The told Sw that her situation has changed a lot for the better.  She had baby shower last week however expressed need for additional clothing.  Sw provided pt with a bundle pack.  Pt identified her parents, as well as FOB's parents as supports.  Sw observed the parents being attentive to the infant and bonding well.  Sw will continue to follow and assist as needed until discharge.      V. SOCIAL WORK PLAN  _X__No Further Intervention Required/No Barriers to Discharge   ___Psychosocial Support and Ongoing  Assessment of Needs   ___Patient/Family Education:   ___Child Protective Services Report   County___________ Date___/____/____   ___Information/Referral to [employer]_________________________   ___Other:

## 2011-04-29 NOTE — Progress Notes (Signed)
Post Partum Day 1 Subjective: no complaints  Objective: Blood pressure 125/80, pulse 66, temperature 98 F (36.7 C), temperature source Oral, resp. rate 18, SpO2 99.00%, unknown if currently breastfeeding.  Physical Exam:  General: alert and no distress Lochia: appropriate Uterine Fundus: firm Incision: n/a DVT Evaluation: No evidence of DVT seen on physical exam.   Basename 04/28/11 0527 04/27/11 1929  HGB 11.4* 11.6*  HCT 33.8* 34.6*    Assessment/Plan: Plan for discharge tomorrow   LOS: 2 days   Jaide Hillenburg A 04/29/2011, 8:18 AM

## 2011-04-29 NOTE — Progress Notes (Signed)
UR chart review completed.  

## 2011-04-30 MED ORDER — PROMETHAZINE HCL 25 MG PO TABS: 25.0000 mg | ORAL_TABLET | Freq: Four times a day (QID) | ORAL | Status: DC | PRN

## 2011-04-30 MED ORDER — OXYCODONE-ACETAMINOPHEN 5-325 MG PO TABS: ORAL_TABLET | ORAL | Status: DC

## 2011-04-30 MED ORDER — IBUPROFEN 600 MG PO TABS: 600.0000 mg | ORAL_TABLET | Freq: Four times a day (QID) | ORAL | Status: DC

## 2011-04-30 MED ORDER — MEDROXYPROGESTERONE ACETATE 150 MG/ML IM SUSP: 150.0000 mg | INTRAMUSCULAR | Status: DC

## 2011-04-30 NOTE — Progress Notes (Signed)
Post Partum Day 2 Subjective: no complaints  Objective: Blood pressure 112/72, pulse 66, temperature 98 F (36.7 C), temperature source Oral, resp. rate 18, SpO2 99.00%, unknown if currently breastfeeding.  Physical Exam:  General: alert and no distress Lochia: appropriate Uterine Fundus: firm Incision: n/a DVT Evaluation: No evidence of DVT seen on physical exam.   Basename 04/28/11 0527 04/27/11 1929  HGB 11.4* 11.6*  HCT 33.8* 34.6*    Assessment/Plan: Discharge home   LOS: 3 days   [name] A 04/30/2011, 8:58 AM

## 2011-04-30 NOTE — Discharge Summary (Signed)
Obstetric Discharge Summary Reason for Admission: onset of labor Prenatal Procedures: ultrasound Intrapartum Procedures: spontaneous vaginal delivery Postpartum Procedures: none Complications-Operative and Postpartum: none Hemoglobin  Date Value Range Status  04/28/2011 11.4* 12.0-15.0 (g/dL) Final     HCT  Date Value Range Status  04/28/2011 33.8* 36.0-46.0 (%) Final    Discharge Diagnoses: Term Pregnancy-delivered  Discharge Information: Date: 04/30/2011 Activity: pelvic rest Diet: routine Medications: PNV, Ibuprofen, Colace and Percocet, Phenergan Condition: stable Instructions: refer to practice specific booklet Discharge to: home Follow-up Information    Follow up with Jolyn Deshmukh A, MD. Schedule an appointment as soon as possible for a visit in 6 weeks.   Contact information:   [address] [place] [address] [phone]          Newborn Data: Live born female  Birth Weight: 7 lb (3175 g) APGAR: 9, 9  Home with mother.  [name] A 04/30/2011, 9:07 AM

## 2011-11-29 ENCOUNTER — Encounter (HOSPITAL_COMMUNITY): Payer: Self-pay

## 2011-11-29 ENCOUNTER — Emergency Department (HOSPITAL_COMMUNITY)
Admission: EM | Admit: 2011-11-29 | Discharge: 2011-11-29 | Disposition: A | Discharge status: Home / Self Care | Payer: Medicaid Other | Attending: Emergency Medicine | Admitting: Emergency Medicine

## 2011-11-29 DIAGNOSIS — I10 Essential (primary) hypertension: Secondary | ICD-10-CM | POA: Insufficient documentation

## 2011-11-29 DIAGNOSIS — Z87891 Personal history of nicotine dependence: Secondary | ICD-10-CM | POA: Insufficient documentation

## 2011-11-29 DIAGNOSIS — D573 Sickle-cell trait: Secondary | ICD-10-CM | POA: Insufficient documentation

## 2011-11-29 DIAGNOSIS — J029 Acute pharyngitis, unspecified: Secondary | ICD-10-CM | POA: Insufficient documentation

## 2011-11-29 DIAGNOSIS — Z8614 Personal history of Methicillin resistant Staphylococcus aureus infection: Secondary | ICD-10-CM | POA: Insufficient documentation

## 2011-11-29 LAB — RAPID STREP SCREEN (MED CTR MEBANE ONLY): Streptococcus, Group A Screen (Direct): NEGATIVE

## 2011-11-29 LAB — MONONUCLEOSIS SCREEN: Mono Screen: NEGATIVE

## 2011-11-29 LAB — CBC WITH DIFFERENTIAL/PLATELET
Basophils Relative: 0 % (ref 0–1)
HCT: 37.8 % (ref 36.0–46.0)
Hemoglobin: 12.9 g/dL (ref 12.0–15.0)
Lymphocytes Relative: 19 % (ref 12–46)
Lymphs Abs: 2.1 10*3/uL (ref 0.7–4.0)
MCHC: 34.1 g/dL (ref 30.0–36.0)
Monocytes Relative: 9 % (ref 3–12)
Neutro Abs: 7.7 10*3/uL (ref 1.7–7.7)
Neutrophils Relative %: 71 % (ref 43–77)
RBC: 4.34 MIL/uL (ref 3.87–5.11)
WBC: 10.8 10*3/uL — ABNORMAL HIGH (ref 4.0–10.5)

## 2011-11-29 MED ORDER — OXYCODONE-ACETAMINOPHEN 5-325 MG PO TABS: 1.0000 | ORAL_TABLET | ORAL | Status: DC | PRN

## 2011-11-29 MED ORDER — OXYCODONE-ACETAMINOPHEN 5-325 MG PO TABS: 1.0000 | ORAL_TABLET | ORAL | Status: AC | PRN

## 2011-11-29 MED ORDER — OXYCODONE-ACETAMINOPHEN 5-325 MG PO TABS: 2.0000 | ORAL_TABLET | ORAL | Status: DC | PRN

## 2011-11-29 MED ORDER — OXYCODONE-ACETAMINOPHEN 5-325 MG PO TABS
2.0000 | ORAL_TABLET | Freq: Once | ORAL | Status: AC
  Administered 2011-11-29: 2 via ORAL
  Filled 2011-11-29: qty 1

## 2011-11-29 NOTE — ED Provider Notes (Signed)
History     CSN: [id number]  Arrival date & time 11/29/11  1521   First MD Initiated Contact with Patient 11/29/11 1725      Chief Complaint  Patient presents with  . Sore Throat  . Otalgia    (Consider location/radiation/quality/duration/timing/severity/associated sxs/prior treatment) HPI Comments: [name] 31 y.o. female   The chief complaint is: Patient presents with:   Sore Throat   Otalgia    31 year old female presents today with chief complaint of sore throat. States that 2 days ago she woke up with a hoarse voice and sore throat. She also noticed swelling of her glands. And tonsils. She states that this is the 10th time that she has had pharyngitis and tonsillar swelling this year alone. She states at that time she had some difficulty breathing when she would close her mouth and pre-pack for her nose. She denies that she had nasal congestion. She felt like it was because her throat was so swollen. She states she is no didn't difficulty breathing at this time. However she has pain with swallowing. She says the pain radiates into her ears bilaterally. She has pain with opening and closing her jaw. She states that she has had nausea and vomiting for the past 2 days it well. She has leftover promethazine from her pregnancy, and has been using that to treat her nausea. She's had subjective fever. She has had chills and sweats. She denies any chest pain shortness of breath abdominal pain diarrhea constipation arthralgias or myalgias. Denies headaches.   Patient is a 31 y.o. female presenting with pharyngitis and ear pain. The history is provided by the patient and the spouse.  Sore Throat Associated symptoms include anorexia, chills, a fever (subjective), nausea, a sore throat, swollen glands and vomiting. Pertinent negatives include no abdominal pain, congestion or coughing.  Otalgia Associated symptoms include sore throat and vomiting. Pertinent negatives include no  abdominal pain and no cough.    Past Medical History  Diagnosis Date  . Hypertension   . Sickle cell trait   . Deliberate self-cutting   . Infection     MRSA in 1995, negative since    Past Surgical History  Procedure Date  . Dilation and curettage of uterus   . Plastic surgery on face   . Hemorroidectomy 2010    Family History  Problem Relation Age of Onset  . Anesthesia problems Neg Hx   . Cancer Mother   . Heart failure Mother   . Hypertension Mother   . Stroke Mother     History  Substance Use Topics  . Smoking status: Former Games developer  . Smokeless tobacco: Never Used  . Alcohol Use: No     hx drug use    OB History    Grav Para Term Preterm Abortions TAB SAB Ect Mult Living   10 5 5  0 5 0 5 0 0 5      Review of Systems  Constitutional: Positive for fever (subjective) and chills.  HENT: Positive for ear pain and sore throat. Negative for congestion, sneezing, dental problem and sinus pressure.   Respiratory: Negative for cough and shortness of breath.   Gastrointestinal: Positive for nausea, vomiting and anorexia. Negative for abdominal pain and constipation.  Genitourinary: Negative for dysuria.  Musculoskeletal: Negative for back pain.    Allergies  Ampicillin and Latex  Home Medications   Current Outpatient Rx  Name Route Sig Dispense Refill  . METRONIDAZOLE 500 MG PO TABS Oral Take  500 mg by mouth 2 (two) times daily.    Marland Kitchen PROMETHAZINE HCL 25 MG PO TABS Oral Take 25 mg by mouth every 6 (six) hours as needed. For nausea or vomiting      BP 123/67  Pulse 97  Temp 98.5 F (36.9 C) (Oral)  Resp 20  SpO2 100%  LMP 06/29/2011  Breastfeeding? No  Physical Exam  Nursing note and vitals reviewed. Constitutional: She is oriented to person, place, and time. She appears well-developed and well-nourished. No distress.  HENT:  Head: Normocephalic and atraumatic.  Mouth/Throat: Oropharyngeal exudate present.       Posterior pharynx is red tonsils are  swollen enlarged crypts are filled with exudate. There is no sign of Ludwig's angina. Airway is open and clear. Patient is breathing easily.  Eyes: Conjunctivae are normal. No scleral icterus.  Neck: Normal range of motion.  Cardiovascular: Normal rate, regular rhythm and normal heart sounds.  Exam reveals no gallop and no friction rub.   No murmur heard. Pulmonary/Chest: Effort normal and breath sounds normal. No respiratory distress. She has no wheezes.  Abdominal: Soft. Bowel sounds are normal. She exhibits no distension and no mass. There is no tenderness. There is no guarding.  Lymphadenopathy:    She has cervical adenopathy (tender anterior and posterior).  Neurological: She is alert and oriented to person, place, and time.  Skin: Skin is warm and dry. She is not diaphoretic.    ED Course  Procedures (including critical care time)  Results for orders placed during the hospital encounter of 11/29/11  RAPID STREP SCREEN      Component Value Range   Streptococcus, Group A Screen (Direct) NEGATIVE  NEGATIVE  MONONUCLEOSIS SCREEN      Component Value Range   Mono Screen NEGATIVE  NEGATIVE  CBC WITH DIFFERENTIAL      Component Value Range   WBC 10.8 (*) 4.0 - 10.5 K/uL   RBC 4.34  3.87 - 5.11 MIL/uL   Hemoglobin 12.9  12.0 - 15.0 g/dL   HCT 16.1  09.6 - 04.5 %   MCV 87.1  78.0 - 100.0 fL   MCH 29.7  26.0 - 34.0 pg   MCHC 34.1  30.0 - 36.0 g/dL   RDW 40.9  81.1 - 91.4 %   Platelets 178  150 - 400 K/uL   Neutrophils Relative 71  43 - 77 %   Neutro Abs 7.7  1.7 - 7.7 K/uL   Lymphocytes Relative 19  12 - 46 %   Lymphs Abs 2.1  0.7 - 4.0 K/uL   Monocytes Relative 9  3 - 12 %   Monocytes Absolute 1.0  0.1 - 1.0 K/uL   Eosinophils Relative 0  0 - 5 %   Eosinophils Absolute 0.0  0.0 - 0.7 K/uL   Basophils Relative 0  0 - 1 %   Basophils Absolute 0.0  0.0 - 0.1 K/uL  POCT PREGNANCY, URINE      Component Value Range   Preg Test, Ur NEGATIVE  NEGATIVE   Patient with multiple  pharyngitis episodes in the past year. In history of pharyngitis. Centaur criteria indicate that she has approximately 28-35% chance of strep pharyngitis at this time. I have sent her strep screen for culture. Her mono was alternated negative. Believe this may be a viral pharyngitis. Have advised the patient that if her cultures come back positive for strep she will be contacted and placed on antibiotic. I've also referred to ENT followup and  she has had so many episodes of tonsillitis and pharyngitis. Patient is interested in having a tonsillectomy. I spoke to the patient about the need reasons to return including difficulty with breathing or swallowing. Patient expresses understanding and agrees with plan. I will send her home with small bowel of a chronic pain medication. All questions answered fully. Patient expresses understanding and agrees with plan.  No results found.   1. Pharyngitis       MDM  Vision is safe for discharge at this time.Discussed reasons to seek immediate care. Patient expresses understanding and agrees with plan.         Arthor Captain, PA-C 11/30/11 [phone]

## 2011-11-29 NOTE — ED Notes (Signed)
Patient reports that she has been having a sore throat, bilateral ear pain, and sinus congestion, and swelling on both sides of neck x 3 days.

## 2011-11-30 LAB — STREP A DNA PROBE
Group A Strep Probe: NEGATIVE
Special Requests: NORMAL

## 2011-11-30 NOTE — ED Provider Notes (Signed)
Medical screening examination/treatment/procedure(s) were performed by non-physician practitioner and as supervising physician I was immediately available for consultation/collaboration.   Leovanni Bjorkman M Kwanza Cancelliere, MD 11/30/11 1538 

## 2011-12-13 ENCOUNTER — Other Ambulatory Visit: Payer: Self-pay | Admitting: Obstetrics

## 2012-01-04 ENCOUNTER — Emergency Department (HOSPITAL_COMMUNITY): Payer: Medicaid Other

## 2012-01-04 ENCOUNTER — Encounter (HOSPITAL_COMMUNITY): Payer: Self-pay | Admitting: *Deleted

## 2012-01-04 ENCOUNTER — Emergency Department (HOSPITAL_COMMUNITY)
Admission: EM | Admit: 2012-01-04 | Discharge: 2012-01-04 | Disposition: A | Discharge status: Home / Self Care | Payer: Medicaid Other | Attending: Emergency Medicine | Admitting: Emergency Medicine

## 2012-01-04 DIAGNOSIS — N730 Acute parametritis and pelvic cellulitis: Secondary | ICD-10-CM | POA: Insufficient documentation

## 2012-01-04 DIAGNOSIS — J029 Acute pharyngitis, unspecified: Secondary | ICD-10-CM | POA: Insufficient documentation

## 2012-01-04 DIAGNOSIS — R05 Cough: Secondary | ICD-10-CM

## 2012-01-04 DIAGNOSIS — I10 Essential (primary) hypertension: Secondary | ICD-10-CM | POA: Insufficient documentation

## 2012-01-04 DIAGNOSIS — R0602 Shortness of breath: Secondary | ICD-10-CM | POA: Insufficient documentation

## 2012-01-04 DIAGNOSIS — R21 Rash and other nonspecific skin eruption: Secondary | ICD-10-CM | POA: Insufficient documentation

## 2012-01-04 LAB — COMPREHENSIVE METABOLIC PANEL
ALT: 8 U/L (ref 0–35)
Alkaline Phosphatase: 60 U/L (ref 39–117)
BUN: 10 mg/dL (ref 6–23)
CO2: 22 mEq/L (ref 19–32)
Calcium: 9.4 mg/dL (ref 8.4–10.5)
GFR calc Af Amer: >90 mL/min (ref >90)
GFR calc non Af Amer: >90 mL/min (ref >90)
Glucose, Bld: 86 mg/dL (ref 70–99)
Sodium: 139 mEq/L (ref 135–145)

## 2012-01-04 LAB — URINALYSIS, ROUTINE W REFLEX MICROSCOPIC
Bilirubin Urine: NEGATIVE
Ketones, ur: NEGATIVE mg/dL
Leukocytes, UA: NEGATIVE
Nitrite: NEGATIVE
Specific Gravity, Urine: 1.024 (ref 1.005–1.030)
Urobilinogen, UA: 1 mg/dL (ref 0.0–1.0)

## 2012-01-04 LAB — CBC WITH DIFFERENTIAL/PLATELET
Eosinophils Relative: 1 % (ref 0–5)
HCT: 38.7 % (ref 36.0–46.0)
Hemoglobin: 13.6 g/dL (ref 12.0–15.0)
Lymphocytes Relative: 28 % (ref 12–46)
Lymphs Abs: 2.4 10*3/uL (ref 0.7–4.0)
MCH: 29.9 pg (ref 26.0–34.0)
MCV: 85.1 fL (ref 78.0–100.0)
Monocytes Absolute: 0.7 10*3/uL (ref 0.1–1.0)
Monocytes Relative: 8 % (ref 3–12)
Platelets: 235 10*3/uL (ref 150–400)
RBC: 4.55 MIL/uL (ref 3.87–5.11)
WBC: 8.8 10*3/uL (ref 4.0–10.5)

## 2012-01-04 MED ORDER — ONDANSETRON HCL 4 MG/2ML IJ SOLN
4.0000 mg | Freq: Once | INTRAMUSCULAR | Status: AC
  Administered 2012-01-04: 4 mg via INTRAVENOUS
  Filled 2012-01-04: qty 2

## 2012-01-04 MED ORDER — AZITHROMYCIN 1 G PO PACK
1.0000 g | PACK | Freq: Once | ORAL | Status: AC
  Administered 2012-01-04: 1 g via ORAL
  Filled 2012-01-04: qty 1

## 2012-01-04 MED ORDER — METRONIDAZOLE 500 MG PO TABS
500.0000 mg | ORAL_TABLET | Freq: Once | ORAL | Status: AC
  Administered 2012-01-04: 500 mg via ORAL
  Filled 2012-01-04: qty 1

## 2012-01-04 MED ORDER — CEPASTAT 14.5 MG MT LOZG: 1.0000 | LOZENGE | OROMUCOSAL | Status: DC | PRN

## 2012-01-04 MED ORDER — BENZONATATE 100 MG PO CAPS: 200.0000 mg | ORAL_CAPSULE | Freq: Three times a day (TID) | ORAL | Status: DC | PRN

## 2012-01-04 MED ORDER — MENTHOL 3 MG MT LOZG
1.0000 | LOZENGE | OROMUCOSAL | Status: DC | PRN
  Filled 2012-01-04: qty 9

## 2012-01-04 MED ORDER — DEXTROSE 5 % IV SOLN
1.0000 g | Freq: Once | INTRAVENOUS | Status: AC
  Administered 2012-01-04: 1 g via INTRAVENOUS
  Filled 2012-01-04: qty 10

## 2012-01-04 MED ORDER — DOXYCYCLINE HYCLATE 100 MG PO CAPS: 100.0000 mg | ORAL_CAPSULE | Freq: Two times a day (BID) | ORAL | Status: DC

## 2012-01-04 MED ORDER — BENZONATATE 100 MG PO CAPS: 100.0000 mg | ORAL_CAPSULE | Freq: Three times a day (TID) | ORAL | Status: DC

## 2012-01-04 MED ORDER — DOXYCYCLINE HYCLATE 100 MG PO TABS
100.0000 mg | ORAL_TABLET | Freq: Once | ORAL | Status: AC
  Administered 2012-01-04: 100 mg via ORAL
  Filled 2012-01-04: qty 1

## 2012-01-04 MED ORDER — METRONIDAZOLE 500 MG PO TABS: 500.0000 mg | ORAL_TABLET | Freq: Two times a day (BID) | ORAL | Status: DC

## 2012-01-04 MED ORDER — KETOROLAC TROMETHAMINE 30 MG/ML IJ SOLN
30.0000 mg | Freq: Once | INTRAMUSCULAR | Status: AC
  Administered 2012-01-04: 30 mg via INTRAVENOUS
  Filled 2012-01-04: qty 1

## 2012-01-04 NOTE — ED Provider Notes (Signed)
History     CSN: [id number]  Arrival date & time 01/04/12  0424   First MD Initiated Contact with Patient 01/04/12 [phone]      Chief Complaint  Patient presents with  . Abdominal Pain  . Sore Throat    (Consider location/radiation/quality/duration/timing/severity/associated sxs/prior treatment) HPI 31 year old female presents to the emergency room complaining of multiple things. Her sexual partner was recently seen in the emergency room and estimates/treated for STI. She is requesting the same. She denies any vaginal discharge. She reports she has had vaginal bleeding, the same as a period with heavy clots for the last 28 days. This stopped 3 days ago. She did not seek care for this abnormal vaginal bleeding. She denies any weakness dizziness or history of anemia. She complains of ongoing cough for the last 10 days. She reports she is coughing up a grey yellow sputum. She denies smoking. She denies any fever. Patient is also complaining of sore throat. Sore throat has been intermittent for "months". She thinks she may have gonorrhea of the throat.. Patient also complaining of lower pelvic pain that goes from her belly button L. the third of vagina. She denies any dysuria. She also complains of bed bug bites, as well as a rash across her face.  Past Medical History  Diagnosis Date  . Hypertension   . Sickle cell trait   . Deliberate self-cutting   . Infection     MRSA in 1995, negative since    Past Surgical History  Procedure Date  . Dilation and curettage of uterus   . Plastic surgery on face   . Hemorroidectomy 2010    Family History  Problem Relation Age of Onset  . Anesthesia problems Neg Hx   . Cancer Mother   . Heart failure Mother   . Hypertension Mother   . Stroke Mother     History  Substance Use Topics  . Smoking status: Former Games developer  . Smokeless tobacco: Never Used  . Alcohol Use: No     hx drug use    OB History    Grav Para Term Preterm Abortions TAB  SAB Ect Mult Living   10 5 5  0 5 0 5 0 0 5      Review of Systems  Constitutional: Negative.   HENT: Positive for congestion, sore throat, rhinorrhea, trouble swallowing and postnasal drip.   Eyes: Negative.   Respiratory: Positive for cough and shortness of breath.   Cardiovascular: Negative.   Gastrointestinal: Positive for nausea and abdominal pain. Negative for vomiting, diarrhea and constipation.  Genitourinary: Positive for vaginal bleeding, vaginal pain, pelvic pain and dyspareunia. Negative for genital sores.  Musculoskeletal: Positive for myalgias and arthralgias.  Skin: Positive for rash.  Neurological: Positive for dizziness.  Hematological: Positive for adenopathy. Bruises/bleeds easily.    Allergies  Amoxicillin and Latex  Home Medications   Current Outpatient Rx  Name Route Sig Dispense Refill  . PROMETHAZINE HCL 25 MG PO TABS Oral Take 25 mg by mouth every 6 (six) hours as needed. For nausea or vomiting      BP 135/80  Pulse 70  Temp 98.3 F (36.8 C)  Resp 20  SpO2 100%  LMP 12/31/2011  Breastfeeding? No  Physical Exam  Nursing note and vitals reviewed. Constitutional: She is oriented to person, place, and time. She appears well-developed and well-nourished.  HENT:  Head: Normocephalic and atraumatic.  Nose: Nose normal.  Mouth/Throat: Oropharynx is clear and moist. No oropharyngeal exudate.  No exudate noted posterior pharynx, mild erythema  Eyes: Conjunctivae normal and EOM are normal. Pupils are equal, round, and reactive to light.  Neck: Normal range of motion. Neck supple. No JVD present. No tracheal deviation present. No thyromegaly present.  Cardiovascular: Normal rate, regular rhythm, normal heart sounds and intact distal pulses.  Exam reveals no gallop and no friction rub.   No murmur heard. Pulmonary/Chest: Effort normal and breath sounds normal. No stridor. No respiratory distress. She has no wheezes. She has no rales. She exhibits no  tenderness.  Abdominal: Soft. Bowel sounds are normal. She exhibits no distension and no mass. There is no tenderness. There is no rebound and no guarding.  Genitourinary:       Thick yellow brown discharge noted in vaginal vault. No cervical motion tenderness but significant uterine tenderness. No adnexal tenderness or masses. Some tenderness of palpation of the bladder.  Musculoskeletal: Normal range of motion. She exhibits no edema and no tenderness.  Lymphadenopathy:    She has no cervical adenopathy.  Neurological: She is alert and oriented to person, place, and time. She exhibits normal muscle tone. Coordination normal.  Skin: Skin is dry. Rash (scattered excoriations over her arms and legs. Hypopigmentation noted in papules to the face) noted. No erythema. No pallor.  Psychiatric: She has a normal mood and affect. Her behavior is normal. Judgment and thought content normal.    ED Course  Procedures (including critical care time)  Labs Reviewed  COMPREHENSIVE METABOLIC PANEL - Abnormal; Notable for the following:    Potassium 3.3 (*)     All other components within normal limits  WET PREP, GENITAL - Abnormal; Notable for the following:    WBC, Wet Prep HPF POC FEW (*)     All other components within normal limits  CBC WITH DIFFERENTIAL  URINALYSIS, ROUTINE W REFLEX MICROSCOPIC  PREGNANCY, URINE  RAPID STREP SCREEN  GC/CHLAMYDIA PROBE AMP, GENITAL  GONOCOCCUS CULTURE   Dg Chest 2 View  01/04/2012  *RADIOLOGY REPORT*  Clinical Data: Chest pain, productive cough.  History of sickle cell disease.  CHEST - 2 VIEW  Comparison: Chest x-ray 08/02/2009.  Findings: Lung volumes are normal.  No consolidative airspace disease.  No pleural effusions.  No pneumothorax.  No pulmonary nodule or mass noted.  Pulmonary vasculature and the cardiomediastinal silhouette are within normal limits.  IMPRESSION: 1. No radiographic evidence of acute cardiopulmonary disease.   Original Report Authenticated  By: Florencia Reasons, M.D.      1. PID (acute pelvic inflammatory disease)   2. Cough   3. Pharyngitis       MDM  31 year old female with a meal. Of complaints. Will provide her information for health department as well as [facility] should she have persistent vaginal bleeding again. Will treat for PID given the uterine tenderness on exam. Patient reports history of amoxicillin allergy, we'll give Rocephin and watched closely after discuss with pharmacy she has low risk of cross reactivity and we will be able to observe her as it is being infused. Patient's hemoglobin and hematocrit do not reflect her story of bleeding 2-3 pads a day for 28 days.        Olivia Mackie, MD 01/04/12 [phone]

## 2012-01-04 NOTE — ED Notes (Signed)
Pt c/o sore throat; productive cough; abd pain; wants to be checked for std's; states has bed bugs;
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
## 2012-01-06 LAB — GONOCOCCUS CULTURE

## 2012-01-06 LAB — GC/CHLAMYDIA PROBE AMP, GENITAL
Chlamydia, DNA Probe: NEGATIVE
GC Probe Amp, Genital: NEGATIVE

## 2012-01-23 ENCOUNTER — Emergency Department (HOSPITAL_COMMUNITY): Payer: Medicaid Other

## 2012-01-23 ENCOUNTER — Emergency Department (HOSPITAL_COMMUNITY)
Admission: EM | Admit: 2012-01-23 | Discharge: 2012-01-23 | Disposition: A | Discharge status: Home / Self Care | Payer: Medicaid Other | Attending: Emergency Medicine | Admitting: Emergency Medicine

## 2012-01-23 ENCOUNTER — Encounter (HOSPITAL_COMMUNITY): Payer: Self-pay | Admitting: Nurse Practitioner

## 2012-01-23 DIAGNOSIS — S63509A Unspecified sprain of unspecified wrist, initial encounter: Secondary | ICD-10-CM | POA: Insufficient documentation

## 2012-01-23 DIAGNOSIS — Z23 Encounter for immunization: Secondary | ICD-10-CM | POA: Insufficient documentation

## 2012-01-23 DIAGNOSIS — Z21 Asymptomatic human immunodeficiency virus [HIV] infection status: Secondary | ICD-10-CM | POA: Insufficient documentation

## 2012-01-23 DIAGNOSIS — I10 Essential (primary) hypertension: Secondary | ICD-10-CM | POA: Insufficient documentation

## 2012-01-23 DIAGNOSIS — T07XXXA Unspecified multiple injuries, initial encounter: Secondary | ICD-10-CM

## 2012-01-23 DIAGNOSIS — M25519 Pain in unspecified shoulder: Secondary | ICD-10-CM | POA: Insufficient documentation

## 2012-01-23 MED ORDER — FLUORESCEIN SODIUM 1 MG OP STRP
1.0000 | ORAL_STRIP | Freq: Once | OPHTHALMIC | Status: AC
  Administered 2012-01-23: 1 via OPHTHALMIC
  Filled 2012-01-23: qty 1

## 2012-01-23 MED ORDER — HYDROCODONE-ACETAMINOPHEN 5-325 MG PO TABS: 1.0000 | ORAL_TABLET | ORAL | Status: DC | PRN

## 2012-01-23 MED ORDER — OXYCODONE-ACETAMINOPHEN 5-325 MG PO TABS
2.0000 | ORAL_TABLET | Freq: Once | ORAL | Status: AC
  Administered 2012-01-23: 2 via ORAL
  Filled 2012-01-23: qty 2

## 2012-01-23 MED ORDER — TETANUS-DIPHTH-ACELL PERTUSSIS 5-2.5-18.5 LF-MCG/0.5 IM SUSP
0.5000 mL | Freq: Once | INTRAMUSCULAR | Status: AC
  Administered 2012-01-23: 0.5 mL via INTRAMUSCULAR
  Filled 2012-01-23: qty 0.5

## 2012-01-23 MED ORDER — TETRACAINE HCL 0.5 % OP SOLN
2.0000 [drp] | Freq: Once | OPHTHALMIC | Status: AC
  Administered 2012-01-23: 2 [drp] via OPHTHALMIC
  Filled 2012-01-23: qty 2

## 2012-01-23 MED ORDER — TOBRAMYCIN 0.3 % OP OINT
TOPICAL_OINTMENT | Freq: Three times a day (TID) | OPHTHALMIC | Status: DC
  Administered 2012-01-23: 22:00:00 via OPHTHALMIC
  Filled 2012-01-23: qty 3.5
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
## 2012-01-23 NOTE — ED Notes (Signed)
Pt requesting to speak to [employer], [employer] notified

## 2012-01-23 NOTE — Progress Notes (Signed)
Orthopedic Tech Progress Note Patient Details:  [name] 08-22-1980 [id number]  Ortho Devices Type of Ortho Device: Velcro wrist splint Ortho Device/Splint Location: right wriast Ortho Device/Splint Interventions: Application   [name] 01/23/2012, 9:53 PM
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
## 2012-01-25 NOTE — ED Provider Notes (Signed)
Medical screening examination/treatment/procedure(s) were performed by non-physician practitioner and as supervising physician I was immediately available for consultation/collaboration.  Isabellah Sobocinski R. Raychell Holcomb, MD 01/25/12 0054 

## 2012-11-30 ENCOUNTER — Encounter: Payer: Self-pay | Admitting: *Deleted

## 2012-11-30 ENCOUNTER — Other Ambulatory Visit: Payer: Self-pay | Admitting: *Deleted

## 2012-11-30 ENCOUNTER — Telehealth: Payer: Self-pay | Admitting: *Deleted

## 2012-11-30 ENCOUNTER — Other Ambulatory Visit: Payer: Self-pay | Admitting: Oncology

## 2012-11-30 NOTE — Progress Notes (Signed)
This 32 year old [place] woman is the knees of one of my patients. [name] has noted a bloody nipple discharge now for at least several weeks, sometimes accompanied by a foul order, and breast pain. She has been reluctant to bring this to medical attention and she has no primary care physician.  Today we discussed in a preliminary manner that this could be something called a papilloma, which generally is benign, but does require surgical intervention. [place] is not my patient and so I placed no orders, but we did facilitate her obtaining an appointment for mammography and possible ductogram at [facility] tomorrow. They will let me know if I need to become more directly involved.

## 2012-11-30 NOTE — Progress Notes (Signed)
Guinevere came in to this clinic today with her Aunt Elyn Aquas and 2 other family members per concerns with left breast and strong family history of breast cancer.  Of note- Edythe called to this RN approximately 2 weeks ago stating niece was experiencing bleeding x 6 months from left breast and with further inquiry by Armstead Peaks also states palable lumps.  Per discussion today- Shaliyah states last delivery was January 2013 but she does state she had a miscarriage " 3 months ago ". Other complaints are : inconsistant vaginal bleeding or vaginal discharge with odor. Dizziness, Bad headaches, And shortness of breath.  Plan per drop in visit today is to pt set up for scans for assessment and MD will speak with pt to assist with Arta and her family's concern.  This RN also verified pt's demographic information with appropriate corrections.

## 2013-01-28 NOTE — Telephone Encounter (Signed)
No entry 

## 2013-04-08 DIAGNOSIS — B009 Herpesviral infection, unspecified: Secondary | ICD-10-CM

## 2013-04-08 HISTORY — DX: Herpesviral infection, unspecified: B00.9

## 2013-04-08 NOTE — L&D Delivery Note (Signed)
Delivery Summary for [name]  Labor Events:   Preterm labor:   Rupture date:   Rupture time:   Rupture type: Artificial  Fluid Color: Clear  Induction:   Augmentation:   Complications:   Cervical ripening:          Delivery:   Episiotomy:   Lacerations:   Repair suture:   Repair # of packets:   Blood loss (ml): 200   Information for the patient's newborn:  [name], [name] [[id number]]    Delivery 11/28/2013 10:16 AM by  Vaginal, Spontaneous Delivery Sex:  female Gestational Age: [redacted]w[redacted]d Delivery Clinician:  Perry Mount Living?: Yes        APGARS  One minute Five minutes Ten minutes  Skin color: 1   1      Heart rate: 2   2      Grimace: 2   2      Muscle tone: 2   2      Breathing: 2   2      Totals: 9  9      Presentation/position: Vertex  Right Occiput Anterior Resuscitation: None  Cord information: 3 vessels   Disposition of cord blood: No    Blood gases sent? No Complications: None  Placenta: Delivered: 11/28/2013 10:25 AM  Spontaneous Pathology  Intact appearance Newborn Measurements: Weight:   Height:   Head circumference:   Chest circumference:   Other providers: Delivery Nurse Student Nurse Student-Medical Melissa Aura Dials D [facility]  Additional  information: Forceps:   Vacuum:   Breech:   Observed anomalies       Pt pushed with good maternal effort to deliver a liveborn female via NSVD with spontaneous cry.   Baby placed on maternal abdomen.  Delayed cord clamping performed.  Cord cut by grandmother.  Placenta delivered intact with 3V cord via traction and pitocin.  no tear. No complications.  Mom and baby to postpartum.  Perry Mount, MD 11:23 AM

## 2013-04-15 ENCOUNTER — Encounter (HOSPITAL_COMMUNITY): Payer: Self-pay | Admitting: Emergency Medicine

## 2013-04-15 ENCOUNTER — Emergency Department (HOSPITAL_COMMUNITY): Payer: Medicaid Other

## 2013-04-15 ENCOUNTER — Emergency Department (HOSPITAL_COMMUNITY)
Admission: EM | Admit: 2013-04-15 | Discharge: 2013-04-15 | Disposition: A | Discharge status: Home / Self Care | Payer: Medicaid Other | Attending: Emergency Medicine | Admitting: Emergency Medicine

## 2013-04-15 DIAGNOSIS — Z91040 Latex allergy status: Secondary | ICD-10-CM | POA: Insufficient documentation

## 2013-04-15 DIAGNOSIS — N644 Mastodynia: Secondary | ICD-10-CM | POA: Insufficient documentation

## 2013-04-15 DIAGNOSIS — Z792 Long term (current) use of antibiotics: Secondary | ICD-10-CM | POA: Insufficient documentation

## 2013-04-15 DIAGNOSIS — I10 Essential (primary) hypertension: Secondary | ICD-10-CM | POA: Insufficient documentation

## 2013-04-15 DIAGNOSIS — R109 Unspecified abdominal pain: Secondary | ICD-10-CM | POA: Insufficient documentation

## 2013-04-15 DIAGNOSIS — Z79899 Other long term (current) drug therapy: Secondary | ICD-10-CM | POA: Insufficient documentation

## 2013-04-15 DIAGNOSIS — Z880 Allergy status to penicillin: Secondary | ICD-10-CM | POA: Insufficient documentation

## 2013-04-15 DIAGNOSIS — Z862 Personal history of diseases of the blood and blood-forming organs and certain disorders involving the immune mechanism: Secondary | ICD-10-CM | POA: Insufficient documentation

## 2013-04-15 DIAGNOSIS — Z8659 Personal history of other mental and behavioral disorders: Secondary | ICD-10-CM | POA: Insufficient documentation

## 2013-04-15 DIAGNOSIS — Z331 Pregnant state, incidental: Secondary | ICD-10-CM | POA: Insufficient documentation

## 2013-04-15 DIAGNOSIS — Z8614 Personal history of Methicillin resistant Staphylococcus aureus infection: Secondary | ICD-10-CM | POA: Insufficient documentation

## 2013-04-15 DIAGNOSIS — Z3490 Encounter for supervision of normal pregnancy, unspecified, unspecified trimester: Secondary | ICD-10-CM

## 2013-04-15 DIAGNOSIS — R112 Nausea with vomiting, unspecified: Secondary | ICD-10-CM | POA: Insufficient documentation

## 2013-04-15 DIAGNOSIS — Z87891 Personal history of nicotine dependence: Secondary | ICD-10-CM | POA: Insufficient documentation

## 2013-04-15 LAB — URINALYSIS, ROUTINE W REFLEX MICROSCOPIC
BILIRUBIN URINE: NEGATIVE
Glucose, UA: NEGATIVE mg/dL
HGB URINE DIPSTICK: NEGATIVE
Ketones, ur: NEGATIVE mg/dL
NITRITE: POSITIVE — AB
PH: 7.5 (ref 5.0–8.0)
Protein, ur: NEGATIVE mg/dL
SPECIFIC GRAVITY, URINE: 1.022 (ref 1.005–1.030)
UROBILINOGEN UA: 1 mg/dL (ref 0.0–1.0)

## 2013-04-15 LAB — CBC
HEMATOCRIT: 32.9 % — AB (ref 36.0–46.0)
Hemoglobin: 11.3 g/dL — ABNORMAL LOW (ref 12.0–15.0)
MCH: 30 pg (ref 26.0–34.0)
MCHC: 34.3 g/dL (ref 30.0–36.0)
MCV: 87.3 fL (ref 78.0–100.0)
PLATELETS: 247 10*3/uL (ref 150–400)
RBC: 3.77 MIL/uL — ABNORMAL LOW (ref 3.87–5.11)
RDW: 14.5 % (ref 11.5–15.5)
WBC: 13 10*3/uL — ABNORMAL HIGH (ref 4.0–10.5)

## 2013-04-15 LAB — ABO/RH: ABO/RH(D): A POS

## 2013-04-15 LAB — COMPREHENSIVE METABOLIC PANEL
ALBUMIN: 2.8 g/dL — AB (ref 3.5–5.2)
ALT: 13 U/L (ref 0–35)
AST: 13 U/L (ref 0–37)
Alkaline Phosphatase: 50 U/L (ref 39–117)
BILIRUBIN TOTAL: 0.6 mg/dL (ref 0.3–1.2)
BUN: 5 mg/dL — AB (ref 6–23)
CALCIUM: 8.5 mg/dL (ref 8.4–10.5)
CO2: 25 mEq/L (ref 19–32)
CREATININE: 0.63 mg/dL (ref 0.50–1.10)
Chloride: 103 mEq/L (ref 96–112)
GFR calc Af Amer: >90 mL/min (ref >90)
GFR calc non Af Amer: >90 mL/min (ref >90)
Glucose, Bld: 88 mg/dL (ref 70–99)
Potassium: 3.8 mEq/L (ref 3.7–5.3)
Sodium: 138 mEq/L (ref 137–147)
TOTAL PROTEIN: 6.3 g/dL (ref 6.0–8.3)

## 2013-04-15 LAB — HIV ANTIBODY (ROUTINE TESTING W REFLEX): HIV: NONREACTIVE

## 2013-04-15 LAB — WET PREP, GENITAL: YEAST WET PREP: NONE SEEN

## 2013-04-15 LAB — RPR: RPR: NONREACTIVE

## 2013-04-15 LAB — URINE MICROSCOPIC-ADD ON

## 2013-04-15 LAB — HCG, QUANTITATIVE, PREGNANCY: hCG, Beta Chain, Quant, S: 12661 m[IU]/mL — ABNORMAL HIGH (ref <5)

## 2013-04-15 LAB — PREGNANCY, URINE: PREG TEST UR: POSITIVE — AB

## 2013-04-15 MED ORDER — NITROFURANTOIN MONOHYD MACRO 100 MG PO CAPS: 100.0000 mg | ORAL_CAPSULE | Freq: Two times a day (BID) | ORAL | Status: AC

## 2013-04-15 MED ORDER — NITROFURANTOIN MONOHYD MACRO 100 MG PO CAPS: 100.0000 mg | ORAL_CAPSULE | Freq: Two times a day (BID) | ORAL | Status: DC

## 2013-04-15 MED ORDER — METRONIDAZOLE 500 MG PO TABS: 500.0000 mg | ORAL_TABLET | Freq: Two times a day (BID) | ORAL | Status: DC

## 2013-04-15 MED ORDER — ONDANSETRON 4 MG PO TBDP
4.0000 mg | ORAL_TABLET | Freq: Once | ORAL | Status: AC
  Administered 2013-04-15: 4 mg via ORAL
  Filled 2013-04-15: qty 1

## 2013-04-15 MED ORDER — ACETAMINOPHEN 325 MG PO TABS
650.0000 mg | ORAL_TABLET | Freq: Once | ORAL | Status: AC
  Administered 2013-04-15: 650 mg via ORAL
  Filled 2013-04-15: qty 2

## 2013-04-15 MED ORDER — PRENATAL COMPLETE 14-0.4 MG PO TABS: 1.0000 | ORAL_TABLET | Freq: Every day | ORAL | Status: DC

## 2013-04-15 NOTE — ED Provider Notes (Signed)
CSN: [id number]     Arrival date & time 04/15/13  0805 History   First MD Initiated Contact with Patient 04/15/13 [phone]     Chief Complaint  Patient presents with  . Vaginal Pain   (Consider location/radiation/quality/duration/timing/severity/associated sxs/prior Treatment) Patient is a 33 y.o. female presenting with vaginal pain.  Vaginal Pain This is a new problem. The current episode started today. The problem occurs constantly. The problem has been unchanged. Associated symptoms include abdominal pain, nausea and vomiting. Pertinent negatives include no chest pain, chills, coughing, fever, headaches, numbness, rash or sore throat. Associated symptoms comments: Breast pain. Nothing aggravates the symptoms. She has tried nothing for the symptoms.     Past Medical History  Diagnosis Date  . Hypertension   . Sickle cell trait   . Deliberate self-cutting   . Infection     MRSA in 1995, negative since   Past Surgical History  Procedure Laterality Date  . Dilation and curettage of uterus    . Plastic surgery on face    . Hemorroidectomy  2010   Family History  Problem Relation Age of Onset  . Anesthesia problems Neg Hx   . Cancer Mother   . Heart failure Mother   . Hypertension Mother   . Stroke Mother    History  Substance Use Topics  . Smoking status: Former Games developermoker  . Smokeless tobacco: Never Used  . Alcohol Use: No     Comment: hx drug use   OB History   Grav Para Term Preterm Abortions TAB SAB Ect Mult Living   10 5 5  0 5 0 5 0 0 5     Review of Systems  Constitutional: Negative for fever and chills.  HENT: Negative for sore throat.   Eyes: Negative for pain.  Respiratory: Negative for cough and shortness of breath.   Cardiovascular: Negative for chest pain.  Gastrointestinal: Positive for nausea, vomiting and abdominal pain. Negative for diarrhea.  Genitourinary: Positive for vaginal pain. Negative for dysuria.  Musculoskeletal: Negative for back pain.  Skin:  Negative for rash.  Neurological: Negative for numbness and headaches.   Allergies  Amoxicillin and Latex  Home Medications   Current Outpatient Rx  Name  Route  Sig  Dispense  Refill  . QUEtiapine (SEROQUEL) 300 MG tablet   Oral   Take 300 mg by mouth at bedtime.         . metroNIDAZOLE (FLAGYL) 500 MG tablet   Oral   Take 1 tablet (500 mg total) by mouth 2 (two) times daily.   14 tablet   0   . nitrofurantoin, macrocrystal-monohydrate, (MACROBID) 100 MG capsule   Oral   Take 1 capsule (100 mg total) by mouth 2 (two) times daily.   14 capsule   0   . Prenatal Vit-Fe Fumarate-FA (PRENATAL COMPLETE) 14-0.4 MG TABS   Oral   Take 1 tablet by mouth daily.   60 each   4    BP 110/82  Temp(Src) 97.8 F (36.6 C) (Oral)  Resp 16  SpO2 97% Physical Exam  Constitutional: She is oriented to person, place, and time. She appears well-developed and well-nourished. No distress.  HENT:  Head: Normocephalic and atraumatic.  Eyes: Pupils are equal, round, and reactive to light. Right eye exhibits no discharge. Left eye exhibits no discharge.  Neck: Normal range of motion.  Cardiovascular: Normal rate, regular rhythm and normal heart sounds.   Pulmonary/Chest: Effort normal and breath sounds normal.  Abdominal: Soft. She  exhibits no distension. There is tenderness in the suprapubic area. There is no rigidity, no guarding and no tenderness at McBurney's point.  Genitourinary: Uterus normal. There is breast tenderness (tenderness to lateral aspect of L breast). No breast discharge. There is no tenderness or lesion on the right labia. There is no tenderness or lesion on the left labia. Cervix exhibits no motion tenderness. Right adnexum displays no mass, no tenderness and no fullness. Left adnexum displays no mass, no tenderness and no fullness. No bleeding around the vagina. No foreign body around the vagina. Vaginal discharge found.  Musculoskeletal: Normal range of motion.   Lymphadenopathy:       Right: No inguinal adenopathy present.       Left: Inguinal adenopathy present.  Neurological: She is alert and oriented to person, place, and time.  Skin: Skin is warm. She is not diaphoretic.    ED Course  Procedures (including critical care time) Labs Review Labs Reviewed  WET PREP, GENITAL - Abnormal; Notable for the following:    Trich, Wet Prep MODERATE (*)    Clue Cells Wet Prep HPF POC FEW (*)    WBC, Wet Prep HPF POC FEW (*)    All other components within normal limits  URINALYSIS, ROUTINE W REFLEX MICROSCOPIC - Abnormal; Notable for the following:    APPearance CLOUDY (*)    Nitrite POSITIVE (*)    Leukocytes, UA SMALL (*)    All other components within normal limits  PREGNANCY, URINE - Abnormal; Notable for the following:    Preg Test, Ur POSITIVE (*)    All other components within normal limits  CBC - Abnormal; Notable for the following:    WBC 13.0 (*)    RBC 3.77 (*)    Hemoglobin 11.3 (*)    HCT 32.9 (*)    All other components within normal limits  COMPREHENSIVE METABOLIC PANEL - Abnormal; Notable for the following:    BUN 5 (*)    Albumin 2.8 (*)    All other components within normal limits  HCG, QUANTITATIVE, PREGNANCY - Abnormal; Notable for the following:    hCG, Beta Chain, Quant, S 12661 (*)    All other components within normal limits  URINE MICROSCOPIC-ADD ON - Abnormal; Notable for the following:    Bacteria, UA MANY (*)    All other components within normal limits  GC/CHLAMYDIA PROBE AMP  URINE CULTURE  RPR  HIV ANTIBODY (ROUTINE TESTING)  ABO/RH   Imaging Review US Ob Comp Less 14 Wks  04/15/2013   CLINICAL DATA:  Early pregnancy.  Pelvic/vaginal pain.  EXAM: OBSTETRIC <14 WK [place] AND TRANSVAGINAL OB US  TECHNIQUE: Both transabdominal and transvaginal ultrasound examinations were performed for complete evaluation of the gestation as well as the maternal uterus, adnexal regions, and pelvic cul-de-sac. Transvaginal  technique was performed to assess early pregnancy.  COMPARISON:  None.  FINDINGS: Intrauterine gestational sac: Visualized/normal in shape.  Yolk sac:  Present  Embryo:  Not visualized  Cardiac Activity: Not visualized  Heart Rate:  Not capital Bull bpm  MSD:  12  mm   6 w   0 d    [place] EDC: 12/09/2013  Maternal uterus/adnexae: 1.9 x 1.4 x 1.7 cm left ovarian echogenic lesion, potentially a complex cyst or corpus luteum of pregnancy.  Trace free pelvic fluid noted.  IMPRESSION: 1. Intrauterine gestational sac with visualized geodes sac but embryo not yet visible. 2. 1.9 cm left ovarian echogenic structure without internal blood flow, potentially  a complex cyst or corpus luteum of pregnancy.   Electronically Signed   By: Herbie Baltimore M.D.   On: 04/15/2013 11:14   US Ob Transvaginal  04/15/2013   CLINICAL DATA:  Early pregnancy.  Pelvic/vaginal pain.  EXAM: OBSTETRIC <14 WK [place] AND TRANSVAGINAL OB US  TECHNIQUE: Both transabdominal and transvaginal ultrasound examinations were performed for complete evaluation of the gestation as well as the maternal uterus, adnexal regions, and pelvic cul-de-sac. Transvaginal technique was performed to assess early pregnancy.  COMPARISON:  None.  FINDINGS: Intrauterine gestational sac: Visualized/normal in shape.  Yolk sac:  Present  Embryo:  Not visualized  Cardiac Activity: Not visualized  Heart Rate:  Not capital Bull bpm  MSD:  12  mm   6 w   0 d    [place] EDC: 12/09/2013  Maternal uterus/adnexae: 1.9 x 1.4 x 1.7 cm left ovarian echogenic lesion, potentially a complex cyst or corpus luteum of pregnancy.  Trace free pelvic fluid noted.  IMPRESSION: 1. Intrauterine gestational sac with visualized geodes sac but embryo not yet visible. 2. 1.9 cm left ovarian echogenic structure without internal blood flow, potentially a complex cyst or corpus luteum of pregnancy.   Electronically Signed   By: Herbie Baltimore M.D.   On: 04/15/2013 11:14    EKG Interpretation   None       MDM    1. Pregnancy   2. Nausea and vomiting    33 yo F with hx of HTN presents to ED with abd pain, vaginal discharge, nausea, vomiting.   PE as above. Whitish discharge appreciated, no CMT, no adnexal/uterine tenderness. Unlikely PID/TOA.   Positive pregnancy test. Will get hcg quant and ABO/Rh, get [place] abd / pelvis.   US demonstrates corpeus luteum and likely IUD. HCG c/w pregnancy. Will discharge with instructions to follow-up with [facility] for repeat US in 2-3 days. Will d/c with PNV. Patient also diagnosed with UTI, trichomonas, which are likely etiologies of abd pain, nausea, vomiting. Will treat with nitrofurantoin, metro. Patient voices understanding of current plan, and agrees to follow-up. Patient discharged to home in stable condition. Strict return precautions given. Patient seen and evaluated by myself and my attending, Dr. Micheline Maze.     Imagene Sheller, MD 04/15/13 [phone]

## 2013-04-15 NOTE — ED Notes (Signed)
Patient transported to Ultrasound 

## 2013-04-15 NOTE — ED Notes (Signed)
Pt given Malaw[place]urkey sandwich and sprite to drink with discharge papers.

## 2013-04-15 NOTE — ED Notes (Addendum)
Pt reports vaginal pain, vaginal discharge, nausea, breast lump and discharge from breast about a hour ago. Vomited x 2 this am. White vaginal discharge x today. Last intercourse last night. Breast discharge x 6 months, yellow/green in color. Pt delivered baby in January 2013. Unsure if has had miscarriage since then.

## 2013-04-16 LAB — GC/CHLAMYDIA PROBE AMP
CT PROBE, AMP APTIMA: NEGATIVE
GC Probe RNA: NEGATIVE

## 2013-04-16 NOTE — ED Provider Notes (Signed)
Medical screening examination/treatment/procedure(s) were conducted as a shared visit with resident-physician practitioner(s) and myself.  I personally evaluated the patient during the encounter.  Pt is a 33 y.o. female with pmhx as above presenting with vaginal pain.  Pt found to have +pregnancy test, UTI, trichomonas.  TVUS w/ gestational sac, but no visualized embryo or heart beat.  Will have pt f/u with OB in 2-3 days for repeat hHBG quant/US.  On PE, VSS, pt in NAD.  Abdominal exam benign.    Shanna CiscoMegan E Docherty, MD 04/16/13 1023

## 2013-04-17 LAB — URINE CULTURE: Colony Count: >=100000

## 2013-04-18 ENCOUNTER — Telehealth (HOSPITAL_COMMUNITY): Payer: Self-pay | Admitting: Emergency Medicine

## 2013-04-18 NOTE — ED Notes (Signed)
Post ED Visit - Positive Culture Follow-up  Culture report reviewed by antimicrobial stewardship pharmacist: [] Wes Dulaney, Pharm.D., BCPS [] Jeremy Frens, Pharm.D., BCPS [] Elizabeth Martin, Pharm.D., BCPS [] Minh Pham, Pharm.D., BCPS, AAHIVP [] Michelle Turner, Pharm.D., BCPS, AAHIVP [x] Andrew Meyer, Pharm.D., BCPS  Positive urine culture Treated with Macrobid, organism sensitive to the same and no further patient follow-up is required at this time.  [name] 04/18/2013, 1:19 PM   

## 2013-04-23 ENCOUNTER — Inpatient Hospital Stay (HOSPITAL_COMMUNITY)
Admission: AD | Admit: 2013-04-23 | Discharge: 2013-04-23 | Disposition: A | Discharge status: Home / Self Care | Payer: Medicaid Other | Source: Ambulatory Visit | Attending: Obstetrics & Gynecology | Admitting: Obstetrics & Gynecology

## 2013-04-23 ENCOUNTER — Inpatient Hospital Stay (HOSPITAL_COMMUNITY): Payer: Medicaid Other

## 2013-04-23 ENCOUNTER — Encounter (HOSPITAL_COMMUNITY): Payer: Self-pay | Admitting: *Deleted

## 2013-04-23 DIAGNOSIS — A5901 Trichomonal vulvovaginitis: Secondary | ICD-10-CM

## 2013-04-23 DIAGNOSIS — O209 Hemorrhage in early pregnancy, unspecified: Secondary | ICD-10-CM | POA: Insufficient documentation

## 2013-04-23 DIAGNOSIS — O26899 Other specified pregnancy related conditions, unspecified trimester: Secondary | ICD-10-CM

## 2013-04-23 DIAGNOSIS — O98819 Other maternal infectious and parasitic diseases complicating pregnancy, unspecified trimester: Secondary | ICD-10-CM | POA: Insufficient documentation

## 2013-04-23 DIAGNOSIS — O2390 Unspecified genitourinary tract infection in pregnancy, unspecified trimester: Secondary | ICD-10-CM | POA: Insufficient documentation

## 2013-04-23 DIAGNOSIS — O4690 Antepartum hemorrhage, unspecified, unspecified trimester: Secondary | ICD-10-CM

## 2013-04-23 DIAGNOSIS — R109 Unspecified abdominal pain: Secondary | ICD-10-CM | POA: Insufficient documentation

## 2013-04-23 DIAGNOSIS — N390 Urinary tract infection, site not specified: Secondary | ICD-10-CM | POA: Insufficient documentation

## 2013-04-23 LAB — WET PREP, GENITAL
Clue Cells Wet Prep HPF POC: NONE SEEN
Yeast Wet Prep HPF POC: NONE SEEN

## 2013-04-23 LAB — URINALYSIS, ROUTINE W REFLEX MICROSCOPIC
BILIRUBIN URINE: NEGATIVE
Glucose, UA: NEGATIVE mg/dL
Hgb urine dipstick: NEGATIVE
Ketones, ur: NEGATIVE mg/dL
Leukocytes, UA: NEGATIVE
NITRITE: NEGATIVE
PROTEIN: NEGATIVE mg/dL
SPECIFIC GRAVITY, URINE: 1.02 (ref 1.005–1.030)
UROBILINOGEN UA: 0.2 mg/dL (ref 0.0–1.0)
pH: 6 (ref 5.0–8.0)

## 2013-04-23 MED ORDER — PROMETHAZINE HCL 25 MG PO TABS: 25.0000 mg | ORAL_TABLET | Freq: Four times a day (QID) | ORAL | Status: DC | PRN

## 2013-04-23 MED ORDER — ONDANSETRON 4 MG PO TBDP
4.0000 mg | ORAL_TABLET | Freq: Once | ORAL | Status: AC
  Administered 2013-04-23: 4 mg via ORAL
  Filled 2013-04-23: qty 1

## 2013-04-23 MED ORDER — METRONIDAZOLE 500 MG PO TABS
2000.0000 mg | ORAL_TABLET | Freq: Once | ORAL | Status: AC
  Administered 2013-04-23: 2000 mg via ORAL
  Filled 2013-04-23: qty 4

## 2013-04-23 MED ORDER — ACETAMINOPHEN 500 MG PO TABS
1000.0000 mg | ORAL_TABLET | Freq: Once | ORAL | Status: AC
  Administered 2013-04-23: 1000 mg via ORAL
  Filled 2013-04-23: qty 2

## 2013-04-23 MED ORDER — NITROFURANTOIN MONOHYD MACRO 100 MG PO CAPS: 100.0000 mg | ORAL_CAPSULE | Freq: Two times a day (BID) | ORAL | Status: DC

## 2013-04-23 MED ORDER — ONDANSETRON HCL 4 MG PO TABS: 4.0000 mg | ORAL_TABLET | Freq: Three times a day (TID) | ORAL | Status: DC | PRN

## 2013-04-23 NOTE — MAU Provider Note (Signed)
Attestation of Attending Supervision of Advanced Practitioner (PA/CNM/NP): Evaluation and management procedures were performed by the Advanced Practitioner under my supervision and collaboration.  I have reviewed the Advanced Practitioner's note and chart, and I agree with the management and plan.  Aidenn Skellenger, MD, FACOG Attending Obstetrician & Gynecologist Faculty Practice, [facility]  

## 2013-04-23 NOTE — Discharge Instructions (Signed)
Abdominal Pain During Pregnancy °Abdominal pain is common in pregnancy. Most of the time, it does not cause harm. There are many causes of abdominal pain. Some causes are more serious than others. Some of the causes of abdominal pain in pregnancy are easily diagnosed. Occasionally, the diagnosis takes time to understand. Other times, the cause is not determined. Abdominal pain can be a sign that something is very wrong with the pregnancy, or the pain may have nothing to do with the pregnancy at all. For this reason, always tell your health care provider if you have any abdominal discomfort. °HOME CARE INSTRUCTIONS  °Monitor your abdominal pain for any changes. The following actions may help to alleviate any discomfort you are experiencing: °· Do not have sexual intercourse or put anything in your vagina until your symptoms go away completely. °· Get plenty of rest until your pain improves. °· Drink clear fluids if you feel nauseous. Avoid solid food as long as you are uncomfortable or nauseous. °· Only take over-the-counter or prescription medicine as directed by your health care provider. °· Keep all follow-up appointments with your health care provider. °SEEK IMMEDIATE MEDICAL CARE IF: °· You are bleeding, leaking fluid, or passing tissue from the vagina. °· You have increasing pain or cramping. °· You have persistent vomiting. °· You have painful or bloody urination. °· You have a fever. °· You notice a decrease in your baby's movements. °· You have extreme weakness or feel faint. °· You have shortness of breath, with or without abdominal pain. °· You develop a severe headache with abdominal pain. °· You have abnormal vaginal discharge with abdominal pain. °· You have persistent diarrhea. °· You have abdominal pain that continues even after rest, or gets worse. °MAKE SURE YOU:  °· Understand these instructions. °· Will watch your condition. °· Will get help right away if you are not doing well or get  worse. °Document Released: 03/25/2005 Document Revised: 01/13/2013 Document Reviewed: 10/22/2012 °Exit[employer]® Patient Information ©2014 [employer]. ° °

## 2013-04-23 NOTE — MAU Note (Signed)
Pt arrived by EMS, started spotting yesterday, is heavier & bright red this morning, lower abd & back pain also since yesterday.  Had pos UPT @ [facility] earlier this month.

## 2013-04-23 NOTE — MAU Note (Signed)
[facility]

## 2013-04-23 NOTE — MAU Provider Note (Signed)
History     CSN: [id number]  Arrival date and time: 04/23/13 0732   None     Chief Complaint  Patient presents with  . Abdominal Pain  . Back Pain  . Vaginal Bleeding   HPI  [name] is a 33 y.o. female [phone] at 568w1d who presents with abdominal pain and vaginal bleeding. Pt [facility] on 04/15/13 and told she had BV and UTI; pt was not informed that she had trichomonas. At that visit patient was not experiencing vaginal bleeding; this is a new problem.  Pt did not complete the treatment of Flagyl. She started having bright red vaginal bleeding and stopped taking the flagyl because she thought the bleeding was caused by the medication. She did not start the treatment for UTI.   OB History   Grav Para Term Preterm Abortions TAB SAB Ect Mult Living   11 5 5  0 5 0 5 0 0 5      Past Medical History  Diagnosis Date  . Hypertension   . Sickle cell trait   . Deliberate self-cutting   . Infection     MRSA in 1995, negative since    Past Surgical History  Procedure Laterality Date  . Dilation and curettage of uterus    . Plastic surgery on face    . Hemorroidectomy  2010    Family History  Problem Relation Age of Onset  . Anesthesia problems Neg Hx   . Cancer Mother   . Heart failure Mother   . Hypertension Mother   . Stroke Mother     History  Substance Use Topics  . Smoking status: Former Games developermoker  . Smokeless tobacco: Never Used  . Alcohol Use: No     Comment: hx drug use    Allergies:  Allergies  Allergen Reactions  . Amoxicillin Hives  . Latex Hives and Itching    Prescriptions prior to admission  Medication Sig Dispense Refill  . metroNIDAZOLE (FLAGYL) 500 MG tablet Take 1 tablet (500 mg total) by mouth 2 (two) times daily.  14 tablet  0  . Prenatal Vit-Fe Fumarate-FA (PRENATAL COMPLETE) 14-0.4 MG TABS Take 1 tablet by mouth daily.  60 each  4  . QUEtiapine (SEROQUEL) 300 MG tablet Take 300 mg by mouth at bedtime.        Results  for orders placed during the hospital encounter of 04/23/13 (from the past 48 hour(s))  URINALYSIS, ROUTINE W REFLEX MICROSCOPIC     Status: None   Collection Time    04/23/13  7:35 AM      Result Value Range   Color, Urine YELLOW  YELLOW   APPearance CLEAR  CLEAR   Specific Gravity, Urine 1.020  1.005 - 1.030   pH 6.0  5.0 - 8.0   Glucose, UA NEGATIVE  NEGATIVE mg/dL   Hgb urine dipstick NEGATIVE  NEGATIVE   Bilirubin Urine NEGATIVE  NEGATIVE   Ketones, ur NEGATIVE  NEGATIVE mg/dL   Protein, ur NEGATIVE  NEGATIVE mg/dL   Urobilinogen, UA 0.2  0.0 - 1.0 mg/dL   Nitrite NEGATIVE  NEGATIVE   Leukocytes, UA NEGATIVE  NEGATIVE   Comment: MICROSCOPIC NOT DONE ON URINES WITH NEGATIVE PROTEIN, BLOOD, LEUKOCYTES, NITRITE, OR GLUCOSE <1000 mg/dL.  WET PREP, GENITAL     Status: Abnormal   Collection Time    04/23/13  8:36 AM      Result Value Range   Yeast Wet Prep HPF POC  NONE SEEN  NONE SEEN   Trich, Wet Prep MANY (*) NONE SEEN   Clue Cells Wet Prep HPF POC NONE SEEN  NONE SEEN   WBC, Wet Prep HPF POC FEW (*) NONE SEEN   Comment: BACTERIA- TOO NUMEROUS TO COUNT    US Ob Transvaginal  04/23/2013   CLINICAL DATA:  33 year old female with new onset vaginal bleeding. Pain. Pregnant with estimated gestational age by LMP 4 weeks and 4 days.  EXAM: TRANSVAGINAL OB ULTRASOUND  TECHNIQUE: Transvaginal ultrasound was performed for complete evaluation of the gestation as well as the maternal uterus, adnexal regions, and pelvic cul-de-sac.  COMPARISON:  04/15/2013.  FINDINGS: Intrauterine gestational sac: Single  Yolk sac:  Visible  Embryo:  Visible  Cardiac Activity: Detected  Heart Rate: 120 bpm  CRL:   8.0  mm   6 w 6 d                  [place] EDC: 12/11/2013  Maternal uterus/adnexae: No subchorionic hemorrhage. Stable, normal ovaries. No pelvic free fluid. Small volume of fluid in the bladder.  IMPRESSION: Single living IUP demonstrated. No acute maternal findings visualized.   Electronically Signed   By:  Augusto Gamble M.D.   On: 04/23/2013 10:08    Review of Systems  Gastrointestinal: Positive for nausea, vomiting and abdominal pain. Negative for diarrhea and constipation.  Genitourinary: Positive for dysuria, urgency, frequency and hematuria.       No vaginal discharge. + vaginal bleeding. No dysuria.    Physical Exam   Blood pressure 110/67, pulse 68, temperature 98.1 F (36.7 C), temperature source Oral, resp. rate 20, last menstrual period 03/22/2013.  Physical Exam  Constitutional: She is oriented to person, place, and time. She appears well-developed and well-nourished. No distress.  HENT:  Head: Normocephalic.  Eyes: Pupils are equal, round, and reactive to light.  Neck: Neck supple.  Respiratory: Effort normal.  GI: Soft. She exhibits no distension and no mass. There is tenderness. There is no rebound and no guarding.  + suprapubic tenderness   Genitourinary: Vaginal discharge found.  Speculum exam: Vagina - Moderate amount of creamy/pink discharge, no odor Cervix - Small contact bleeding  Bimanual exam: Cervix closed Uterus non tender, enlarged  Adnexa non tender, no masses bilaterally, + suprapubic tenderness  wet prep done Chaperone present for exam.   Neurological: She is alert and oriented to person, place, and time.  Skin: Skin is warm and dry. She is not diaphoretic.  Psychiatric: Her behavior is normal.    MAU Course  Procedures None  MDM UA Wet prep Urine culture on 1/8 showed >100,000 ecoli- treatment for UTI sent to pharmacy. Flagyl 2000 mg given PO in MAU Zofran 4 mg ODT in MAU  Tylenol 1 gram in MAU per patient requests.   Assessment and Plan   A:  1. Vaginal bleeding in pregnancy   2. Trichomonas vaginitis   3. Abdominal pain in pregnancy   4.   Urinary tract infection  5.   IUP 6 weeks 6 days on [place]    P:  Discharge home in stable condition  Pt informed of STI; pt to inform partner. No sex until 10 days after you finished your  medicine and no sex until 10 days after your partner has taken their medication. RX: Macrobid        Phenergan         Zofran  Pt encouraged to take medication as prescribed  Return to MAU  as needed, if symptoms worsen Start prenatal care ASAP Pelvic rest discussed   Iona Hansen My Rinke, NP  04/23/2013, 4:04 PM

## 2013-05-10 ENCOUNTER — Inpatient Hospital Stay (HOSPITAL_COMMUNITY)
Admission: AD | Admit: 2013-05-10 | Discharge: 2013-05-10 | Disposition: A | Discharge status: Home / Self Care | Payer: Medicaid Other | Source: Ambulatory Visit | Attending: Obstetrics & Gynecology | Admitting: Obstetrics & Gynecology

## 2013-05-10 ENCOUNTER — Inpatient Hospital Stay (HOSPITAL_COMMUNITY): Payer: Medicaid Other

## 2013-05-10 ENCOUNTER — Encounter (HOSPITAL_COMMUNITY): Payer: Self-pay | Admitting: *Deleted

## 2013-05-10 DIAGNOSIS — O2390 Unspecified genitourinary tract infection in pregnancy, unspecified trimester: Secondary | ICD-10-CM | POA: Insufficient documentation

## 2013-05-10 DIAGNOSIS — O26859 Spotting complicating pregnancy, unspecified trimester: Secondary | ICD-10-CM | POA: Insufficient documentation

## 2013-05-10 DIAGNOSIS — B9689 Other specified bacterial agents as the cause of diseases classified elsewhere: Secondary | ICD-10-CM | POA: Insufficient documentation

## 2013-05-10 DIAGNOSIS — A5901 Trichomonal vulvovaginitis: Secondary | ICD-10-CM | POA: Diagnosis present

## 2013-05-10 DIAGNOSIS — Z87891 Personal history of nicotine dependence: Secondary | ICD-10-CM | POA: Insufficient documentation

## 2013-05-10 DIAGNOSIS — M549 Dorsalgia, unspecified: Secondary | ICD-10-CM | POA: Insufficient documentation

## 2013-05-10 DIAGNOSIS — N939 Abnormal uterine and vaginal bleeding, unspecified: Secondary | ICD-10-CM

## 2013-05-10 DIAGNOSIS — Z3490 Encounter for supervision of normal pregnancy, unspecified, unspecified trimester: Secondary | ICD-10-CM

## 2013-05-10 DIAGNOSIS — O98819 Other maternal infectious and parasitic diseases complicating pregnancy, unspecified trimester: Secondary | ICD-10-CM | POA: Insufficient documentation

## 2013-05-10 DIAGNOSIS — N949 Unspecified condition associated with female genital organs and menstrual cycle: Secondary | ICD-10-CM | POA: Insufficient documentation

## 2013-05-10 DIAGNOSIS — A499 Bacterial infection, unspecified: Secondary | ICD-10-CM | POA: Insufficient documentation

## 2013-05-10 DIAGNOSIS — N760 Acute vaginitis: Secondary | ICD-10-CM | POA: Insufficient documentation

## 2013-05-10 HISTORY — DX: Trichomoniasis, unspecified: A59.9

## 2013-05-10 HISTORY — DX: Schizophrenia, unspecified: F20.9

## 2013-05-10 HISTORY — DX: Bipolar disorder, unspecified: F31.9

## 2013-05-10 LAB — URINALYSIS, ROUTINE W REFLEX MICROSCOPIC
BILIRUBIN URINE: NEGATIVE
Glucose, UA: NEGATIVE mg/dL
Hgb urine dipstick: NEGATIVE
KETONES UR: NEGATIVE mg/dL
LEUKOCYTES UA: NEGATIVE
Nitrite: NEGATIVE
PH: 6 (ref 5.0–8.0)
PROTEIN: NEGATIVE mg/dL
Specific Gravity, Urine: >1.03 — ABNORMAL HIGH (ref 1.005–1.030)
UROBILINOGEN UA: 1 mg/dL (ref 0.0–1.0)

## 2013-05-10 LAB — WET PREP, GENITAL
Trich, Wet Prep: NONE SEEN
Yeast Wet Prep HPF POC: NONE SEEN

## 2013-05-10 LAB — GC/CHLAMYDIA PROBE AMP
CT Probe RNA: NEGATIVE
GC PROBE AMP APTIMA: NEGATIVE

## 2013-05-10 MED ORDER — ONDANSETRON 8 MG PO TBDP
8.0000 mg | ORAL_TABLET | ORAL | Status: AC
  Administered 2013-05-10: 8 mg via ORAL
  Filled 2013-05-10: qty 1

## 2013-05-10 MED ORDER — METRONIDAZOLE 500 MG PO TABS
2000.0000 mg | ORAL_TABLET | ORAL | Status: AC
  Administered 2013-05-10: 2000 mg via ORAL
  Filled 2013-05-10: qty 4

## 2013-05-10 MED ORDER — METRONIDAZOLE 500 MG PO TABS: 2000.0000 mg | ORAL_TABLET | ORAL | Status: DC

## 2013-05-10 MED ORDER — METRONIDAZOLE 500 MG PO TABS: 500.0000 mg | ORAL_TABLET | Freq: Two times a day (BID) | ORAL | Status: DC

## 2013-05-10 NOTE — MAU Note (Signed)
States she is supposed to be on blood pressure med and seroquel, but she is not taking it. States she is bipolar and schizophrenic.

## 2013-05-10 NOTE — MAU Provider Note (Signed)
Attestation of Attending Supervision of Advanced Practitioner (CNM/NP): Evaluation and management procedures were performed by the Advanced Practitioner under my supervision and collaboration.  I have reviewed the Advanced Practitioner's note and chart, and I agree with the management and plan.  HARRAWAY-SMITH, Anice Wilshire 10:38 AM

## 2013-05-10 NOTE — MAU Note (Addendum)
To room via W/C from car. Was able to get out of W/C, move about in room, talk on phone. Had to interrupt to start care. States had some bleeding last week that subsided. Now has been spotting and bleeding since yesterday. No bleeding at present time. Cramping a lot. States did not finish medication for trich.

## 2013-05-10 NOTE — Discharge Instructions (Signed)
Trichomoniasis °Trichomoniasis is an infection, caused by the Trichomonas organism, that affects both women and men. In women, the outer female genitalia and the vagina are affected. In men, the penis is mainly affected, but the prostate and other reproductive organs can also be involved. Trichomoniasis is a sexually transmitted disease (STD) and is most often passed to another person through sexual contact. The majority of people who get trichomoniasis do so from a sexual encounter and are also at risk for other STDs. °CAUSES  °· Sexual intercourse with an infected partner. °· It can be present in swimming pools or hot tubs. °SYMPTOMS  °· Abnormal gray-green frothy vaginal discharge in women. °· Vaginal itching and irritation in women. °· Itching and irritation of the area outside the vagina in women. °· Penile discharge with or without pain in males. °· Inflammation of the urethra (urethritis), causing painful urination. °· Bleeding after sexual intercourse. °RELATED COMPLICATIONS °· Pelvic inflammatory disease. °· Infection of the uterus (endometritis). °· Infertility. °· Tubal (ectopic) pregnancy. °· It can be associated with other STDs, including gonorrhea and chlamydia, hepatitis B, and HIV. °COMPLICATIONS DURING PREGNANCY °· Early (premature) delivery. °· Premature rupture of the membranes (PROM). °· Low birth weight. °DIAGNOSIS  °· Visualization of Trichomonas under the microscope from the vagina discharge. °· Ph of the vagina greater than 4.5, tested with a test tape. °· Trich Rapid Test. °· Culture of the organism, but this is not usually needed. °· It may be found on a Pap test. °· Having a "strawberry cervix,"which means the cervix looks very red like a strawberry. °TREATMENT  °· You may be given medication to fight the infection. Inform your caregiver if you could be or are pregnant. Some medications used to treat the infection should not be taken during pregnancy. °· Over-the-counter medications or  creams to decrease itching or irritation may be recommended. °· Your sexual partner will need to be treated if infected. °HOME CARE INSTRUCTIONS  °· Take all medication prescribed by your caregiver. °· Take over-the-counter medication for itching or irritation as directed by your caregiver. °· Do not have sexual intercourse while you have the infection. °· Do not douche or wear tampons. °· Discuss your infection with your partner, as your partner may have acquired the infection from you. Or, your partner may have been the person who transmitted the infection to you. °· Have your sex partner examined and treated if necessary. °· Practice safe, informed, and protected sex. °· See your caregiver for other STD testing. °SEEK MEDICAL CARE IF:  °· You still have symptoms after you finish the medication. °· You have an oral temperature above 102° F (38.9° C). °· You develop belly (abdominal) pain. °· You have pain when you urinate. °· You have bleeding after sexual intercourse. °· You develop a rash. °· The medication makes you sick or makes you throw up (vomit). °Document Released: 09/18/2000 Document Revised: 06/17/2011 Document Reviewed: 10/14/2008 °ExitCare® Patient Information ©2014 [employer]. ° ° ° ° ° ° °Bacterial Vaginosis °Bacterial vaginosis is a vaginal infection that occurs when the normal balance of bacteria in the vagina is disrupted. It results from an overgrowth of certain bacteria. This is the most common vaginal infection in women of childbearing age. Treatment is important to prevent complications, especially in pregnant women, as it can cause a premature delivery. °CAUSES  °Bacterial vaginosis is caused by an increase in harmful bacteria that are normally present in smaller amounts in the vagina. Several different kinds of bacteria   can cause bacterial vaginosis. However, the reason that the condition develops is not fully understood. °RISK FACTORS °Certain activities or behaviors can put you at an  increased risk of developing bacterial vaginosis, including: °· Having a new sex partner or multiple sex partners. °· Douching. °· Using an intrauterine device (IUD) for contraception. °Women do not get bacterial vaginosis from toilet seats, bedding, swimming pools, or contact with objects around them. °SIGNS AND SYMPTOMS  °Some women with bacterial vaginosis have no signs or symptoms. Common symptoms include: °· Grey vaginal discharge. °· A fishlike odor with discharge, especially after sexual intercourse. °· Itching or burning of the vagina and vulva. °· Burning or pain with urination. °DIAGNOSIS  °Your health care provider will take a medical history and examine the vagina for signs of bacterial vaginosis. A sample of vaginal fluid may be taken. Your health care provider will look at this sample under a microscope to check for bacteria and abnormal cells. A vaginal pH test may also be done.  °TREATMENT  °Bacterial vaginosis may be treated with antibiotic medicines. These may be given in the form of a pill or a vaginal cream. A second round of antibiotics may be prescribed if the condition comes back after treatment.  °HOME CARE INSTRUCTIONS  °· Only take over-the-counter or prescription medicines as directed by your health care provider. °· If antibiotic medicine was prescribed, take it as directed. Make sure you finish it even if you start to feel better. °· Do not have sex until treatment is completed. °· Tell all sexual partners that you have a vaginal infection. They should see their health care provider and be treated if they have problems, such as a mild rash or itching. °· Practice safe sex by using condoms and only having one sex partner. °SEEK MEDICAL CARE IF:  °· Your symptoms are not improving after 3 days of treatment. °· You have increased discharge or pain. °· You have a fever. °MAKE SURE YOU:  °· Understand these instructions. °· Will watch your condition. °· Will get help right away if you are not  doing well or get worse. °FOR MORE INFORMATION  °Centers for Disease Control and Prevention, Division of STD Prevention: www.cdc.gov/std °American Sexual Health Association (ASHA): www.ashastd.org  °Document Released: 03/25/2005 Document Revised: 01/13/2013 Document Reviewed: 11/04/2012 °ExitCare® Patient Information ©2014 [employer]. ° °

## 2013-05-10 NOTE — MAU Provider Note (Signed)
Chief Complaint: Vaginal Bleeding   First Provider Initiated Contact with Patient 05/10/13 [phone]     SUBJECTIVE HPI: [name] is a 33 y.o. [id number] at [redacted]w[redacted]d by LMP who presents to maternity admissions reporting vaginal spotting x1 week, off and on.  She reports bright red spotting a few days ago, but more light brown spotting today.  She also reports mild intermittent cramping since the start of the bleeding.  She was seen 04/15/13 at [facility] and diagnosed with  UTI, treated with Macrobid.  She was positive for trichomonas also at that visit, but was not treated until 04/23/13 when she presented to MAU.  At her MAU visit, she also had a live IUP at [redacted]w[redacted]d by U/S.  The pt reports her husband was not treated as he states he does not have anything.  She has multiple questions today about how long she may have had trichomonas and about symptoms of the disease.  She reports malodorous vaginal discharge since the spotting started, but denies vaginal itching/burning, urinary symptoms, h/a, dizziness, n/v, or fever/chills.     Past Medical History  Diagnosis Date  . Hypertension   . Sickle cell trait   . Deliberate self-cutting   . Infection     MRSA in 1995, negative since  . Trichomonas infection   . Bipolar 1 disorder   . Schizophrenia    Past Surgical History  Procedure Laterality Date  . Dilation and curettage of uterus    . Plastic surgery on face    . Hemorroidectomy  2010   History   Social History  . Marital Status: Married    Spouse Name: N/A    Number of Children: N/A  . Years of Education: N/A   Occupational History  . Not on file.   Social History Main Topics  . Smoking status: Former Games developer  . Smokeless tobacco: Never Used  . Alcohol Use: No     Comment: hx drug use  . Drug Use: Yes    Special: Marijuana     Comment: smoked at superbowl party last night  . Sexual Activity: Yes    Birth Control/ Protection: None   Other Topics Concern  . Not on file   Social  History Narrative  . No narrative on file   No current facility-administered medications on file prior to encounter.   Current Outpatient Prescriptions on File Prior to Encounter  Medication Sig Dispense Refill  . ondansetron (ZOFRAN) 4 MG tablet Take 1 tablet (4 mg total) by mouth every 8 (eight) hours as needed for nausea or vomiting.  20 tablet  0  . Prenatal Vit-Fe Fumarate-FA (PRENATAL MULTIVITAMIN) TABS tablet Take 1 tablet by mouth daily at 12 noon.      . promethazine (PHENERGAN) 25 MG tablet Take 1 tablet (25 mg total) by mouth every 6 (six) hours as needed for nausea or vomiting.  30 tablet  0  . QUEtiapine (SEROQUEL) 300 MG tablet Take 300 mg by mouth at bedtime.       Allergies  Allergen Reactions  . Amoxicillin Hives  . Latex Hives and Itching    ROS: Pertinent items in HPI  OBJECTIVE Blood pressure 123/94, pulse 89, temperature 98.7 F (37.1 C), temperature source Oral, resp. rate 18, height 5\' 3"  (1.6 m), weight 55.339 kg (122 lb), last menstrual period 03/22/2013. GENERAL: Well-developed, well-nourished female in no acute distress.  HEENT: Normocephalic HEART: normal rate RESP: normal effort ABDOMEN: Soft, non-tender EXTREMITIES: Nontender, no edema NEURO:  Alert and oriented Pelvic exam: Cervix difficult to visual r/t large amount of watery discharge, copious amount thin pink/light brown fluid in vaginal vault, vaginal walls and external genitalia normal Bimanual exam: Cervix 0/long/high, firm, anterior, neg CMT, uterus mildly tender, ~8 week size, adnexa with mild tenderness bilaterally, no enlargement or mass palpable  LAB RESULTS Results for orders placed during the hospital encounter of 05/10/13 (from the past 24 hour(s))  URINALYSIS, ROUTINE W REFLEX MICROSCOPIC     Status: Abnormal   Collection Time    05/10/13  8:08 AM      Result Value Range   Color, Urine YELLOW  YELLOW   APPearance HAZY (*) CLEAR   Specific Gravity, Urine >1.030 (*) 1.005 - 1.030    pH 6.0  5.0 - 8.0   Glucose, UA NEGATIVE  NEGATIVE mg/dL   Hgb urine dipstick NEGATIVE  NEGATIVE   Bilirubin Urine NEGATIVE  NEGATIVE   Ketones, ur NEGATIVE  NEGATIVE mg/dL   Protein, ur NEGATIVE  NEGATIVE mg/dL   Urobilinogen, UA 1.0  0.0 - 1.0 mg/dL   Nitrite NEGATIVE  NEGATIVE   Leukocytes, UA NEGATIVE  NEGATIVE  WET PREP, GENITAL     Status: Abnormal   Collection Time    05/10/13  8:38 AM      Result Value Range   Yeast Wet Prep HPF POC NONE SEEN  NONE SEEN   Trich, Wet Prep NONE SEEN  NONE SEEN   Clue Cells Wet Prep HPF POC MODERATE (*) NONE SEEN   WBC, Wet Prep HPF POC FEW (*) NONE SEEN    IMAGING  US Ob Comp Less 14 Wks  05/10/2013   CLINICAL DATA:  33 year old female with back pain, pelvic pain, and spotting. Estimated gestational age by LMP 7 weeks and 0 days. Quantitative beta HCG not specified.  EXAM: OBSTETRIC <14 WK ULTRASOUND  TECHNIQUE: Transabdominal ultrasound was performed for evaluation of the gestation as well as the maternal uterus and adnexal regions.  COMPARISON:  04/23/2013 and earlier.  FINDINGS: Intrauterine gestational sac: Single  Yolk sac:  Visible  Embryo:  Visible  Cardiac Activity: Detected  Heart Rate: 167 bpm  CRL: 2.70 cm (previously 8 mm) 9 w 4 d [place] EDC: 12/09/2013.  Maternal uterus/adnexae: No subchorionic hemorrhage or pelvic free fluid identified. Both ovaries appear stable and normal (1.9 x 2.3 x 1.5 cm on the left and 1.9 x 2.3 x 1.5 cm on the right).  IMPRESSION: Single living IUP demonstrated. No acute maternal findings visualized.   Electronically Signed   By: Augusto Gamble M.D.   On: 05/10/2013 09:31     US Ob Transvaginal  04/23/2013   CLINICAL DATA:  33 year old female with new onset vaginal bleeding. Pain. Pregnant with estimated gestational age by LMP 4 weeks and 4 days.  EXAM: TRANSVAGINAL OB ULTRASOUND  TECHNIQUE: Transvaginal ultrasound was performed for complete evaluation of the gestation as well as the maternal uterus, adnexal regions, and  pelvic cul-de-sac.  COMPARISON:  04/15/2013.  FINDINGS: Intrauterine gestational sac: Single  Yolk sac:  Visible  Embryo:  Visible  Cardiac Activity: Detected  Heart Rate: 120 bpm  CRL:   8.0  mm   6 w 6 d                  [place] EDC: 12/11/2013  Maternal uterus/adnexae: No subchorionic hemorrhage. Stable, normal ovaries. No pelvic free fluid. Small volume of fluid in the bladder.  IMPRESSION: Single living IUP demonstrated. No acute maternal findings  visualized.   Electronically Signed   By: Augusto GambleLee  Hall M.D.   On: 04/23/2013 10:08      ASSESSMENT 1. Trichomonas vaginalis (TV) infection   2. Bacterial vaginosis   3. Vaginal spotting   4. Normal IUP (intrauterine pregnancy) on prenatal ultrasound     PLAN Flagyl 2000 mg PO dose in MAU Discharge home Flagyl 500 mg PO BID x7 days sent to pharmacy Recommend partner treatment.  Answered pt questions about normal course of STD, likely short incubation period and recent acquisition.  Pt stated understanding.  Keep scheduled appt in [facility] Return to MAU as needed    Medication List         metroNIDAZOLE 500 MG tablet  Commonly known as:  FLAGYL  Take 4 tablets (2,000 mg total) by mouth stat.     metroNIDAZOLE 500 MG tablet  Commonly known as:  FLAGYL  Take 1 tablet (500 mg total) by mouth 2 (two) times daily.     ondansetron 4 MG tablet  Commonly known as:  ZOFRAN  Take 1 tablet (4 mg total) by mouth every 8 (eight) hours as needed for nausea or vomiting.     prenatal multivitamin Tabs tablet  Take 1 tablet by mouth daily at 12 noon.     promethazine 25 MG tablet  Commonly known as:  PHENERGAN  Take 1 tablet (25 mg total) by mouth every 6 (six) hours as needed for nausea or vomiting.     QUEtiapine 300 MG tablet  Commonly known as:  SEROQUEL  Take 300 mg by mouth at bedtime.       Follow-up Information   Follow up with [facility]. (Keep appointment on 05/25/13 at 2:20 pm.  Return to MAU as needed.)    Specialty:   Obstetrics and Gynecology   Contact information:   [address] [place] [id number] [phone]      Sharen CounterLisa Leftwich-Kirby Certified Nurse-Midwife 05/10/2013  10:09 AM

## 2013-05-19 ENCOUNTER — Encounter (HOSPITAL_COMMUNITY): Payer: Self-pay | Admitting: General Practice

## 2013-05-19 ENCOUNTER — Inpatient Hospital Stay (HOSPITAL_COMMUNITY)
Admission: AD | Admit: 2013-05-19 | Discharge: 2013-05-20 | Disposition: A | Discharge status: Home / Self Care | Payer: Medicaid Other | Source: Ambulatory Visit | Attending: Emergency Medicine | Admitting: Emergency Medicine

## 2013-05-19 ENCOUNTER — Inpatient Hospital Stay (HOSPITAL_COMMUNITY): Payer: Medicaid Other

## 2013-05-19 DIAGNOSIS — O2390 Unspecified genitourinary tract infection in pregnancy, unspecified trimester: Secondary | ICD-10-CM | POA: Insufficient documentation

## 2013-05-19 DIAGNOSIS — O210 Mild hyperemesis gravidarum: Secondary | ICD-10-CM | POA: Insufficient documentation

## 2013-05-19 DIAGNOSIS — D573 Sickle-cell trait: Secondary | ICD-10-CM | POA: Insufficient documentation

## 2013-05-19 DIAGNOSIS — R197 Diarrhea, unspecified: Secondary | ICD-10-CM

## 2013-05-19 DIAGNOSIS — O99891 Other specified diseases and conditions complicating pregnancy: Secondary | ICD-10-CM | POA: Insufficient documentation

## 2013-05-19 DIAGNOSIS — B373 Candidiasis of vulva and vagina: Secondary | ICD-10-CM | POA: Insufficient documentation

## 2013-05-19 DIAGNOSIS — O26891 Other specified pregnancy related conditions, first trimester: Secondary | ICD-10-CM

## 2013-05-19 DIAGNOSIS — B3731 Acute candidiasis of vulva and vagina: Secondary | ICD-10-CM | POA: Insufficient documentation

## 2013-05-19 DIAGNOSIS — R519 Headache, unspecified: Secondary | ICD-10-CM

## 2013-05-19 DIAGNOSIS — Z87891 Personal history of nicotine dependence: Secondary | ICD-10-CM | POA: Insufficient documentation

## 2013-05-19 DIAGNOSIS — Y999 Unspecified external cause status: Secondary | ICD-10-CM | POA: Insufficient documentation

## 2013-05-19 DIAGNOSIS — Y92009 Unspecified place in unspecified non-institutional (private) residence as the place of occurrence of the external cause: Secondary | ICD-10-CM | POA: Insufficient documentation

## 2013-05-19 DIAGNOSIS — R51 Headache: Secondary | ICD-10-CM | POA: Insufficient documentation

## 2013-05-19 DIAGNOSIS — O99019 Anemia complicating pregnancy, unspecified trimester: Secondary | ICD-10-CM | POA: Insufficient documentation

## 2013-05-19 DIAGNOSIS — O10019 Pre-existing essential hypertension complicating pregnancy, unspecified trimester: Secondary | ICD-10-CM | POA: Insufficient documentation

## 2013-05-19 DIAGNOSIS — O9989 Other specified diseases and conditions complicating pregnancy, childbirth and the puerperium: Principal | ICD-10-CM

## 2013-05-19 DIAGNOSIS — R112 Nausea with vomiting, unspecified: Secondary | ICD-10-CM

## 2013-05-19 DIAGNOSIS — W010XXA Fall on same level from slipping, tripping and stumbling without subsequent striking against object, initial encounter: Secondary | ICD-10-CM | POA: Insufficient documentation

## 2013-05-19 DIAGNOSIS — M549 Dorsalgia, unspecified: Secondary | ICD-10-CM | POA: Insufficient documentation

## 2013-05-19 DIAGNOSIS — O9A211 Injury, poisoning and certain other consequences of external causes complicating pregnancy, first trimester: Secondary | ICD-10-CM

## 2013-05-19 DIAGNOSIS — J322 Chronic ethmoidal sinusitis: Secondary | ICD-10-CM | POA: Insufficient documentation

## 2013-05-19 LAB — CBC WITH DIFFERENTIAL/PLATELET
BASOS ABS: 0 10*3/uL (ref 0.0–0.1)
Basophils Relative: 0 % (ref 0–1)
Eosinophils Absolute: 0.1 10*3/uL (ref 0.0–0.7)
Eosinophils Relative: 0 % (ref 0–5)
HCT: 34.8 % — ABNORMAL LOW (ref 36.0–46.0)
HEMOGLOBIN: 12.2 g/dL (ref 12.0–15.0)
LYMPHS PCT: 7 % — AB (ref 12–46)
Lymphs Abs: 1.1 10*3/uL (ref 0.7–4.0)
MCH: 30.7 pg (ref 26.0–34.0)
MCHC: 35.1 g/dL (ref 30.0–36.0)
MCV: 87.4 fL (ref 78.0–100.0)
MONO ABS: 0.9 10*3/uL (ref 0.1–1.0)
MONOS PCT: 6 % (ref 3–12)
NEUTROS PCT: 87 % — AB (ref 43–77)
Neutro Abs: 14.3 10*3/uL — ABNORMAL HIGH (ref 1.7–7.7)
Platelets: 205 10*3/uL (ref 150–400)
RBC: 3.98 MIL/uL (ref 3.87–5.11)
RDW: 15.3 % (ref 11.5–15.5)
WBC: 16.4 10*3/uL — ABNORMAL HIGH (ref 4.0–10.5)

## 2013-05-19 LAB — URINALYSIS, ROUTINE W REFLEX MICROSCOPIC
Bilirubin Urine: NEGATIVE
Glucose, UA: NEGATIVE mg/dL
Hgb urine dipstick: NEGATIVE
KETONES UR: NEGATIVE mg/dL
LEUKOCYTES UA: NEGATIVE
NITRITE: NEGATIVE
Protein, ur: NEGATIVE mg/dL
Specific Gravity, Urine: 1.01 (ref 1.005–1.030)
Urobilinogen, UA: 0.2 mg/dL (ref 0.0–1.0)
pH: 8 (ref 5.0–8.0)

## 2013-05-19 LAB — RAPID URINE DRUG SCREEN, HOSP PERFORMED
Amphetamines: NOT DETECTED
BENZODIAZEPINES: NOT DETECTED
Barbiturates: NOT DETECTED
COCAINE: NOT DETECTED
OPIATES: NOT DETECTED
Tetrahydrocannabinol: POSITIVE — AB

## 2013-05-19 LAB — HCG, QUANTITATIVE, PREGNANCY: hCG, Beta Chain, Quant, S: 71826 m[IU]/mL — ABNORMAL HIGH (ref <5)

## 2013-05-19 LAB — WET PREP, GENITAL
Clue Cells Wet Prep HPF POC: NONE SEEN
Trich, Wet Prep: NONE SEEN

## 2013-05-19 MED ORDER — FLUCONAZOLE 150 MG PO TABS
150.0000 mg | ORAL_TABLET | Freq: Once | ORAL | Status: AC
  Administered 2013-05-19: 150 mg via ORAL
  Filled 2013-05-19: qty 1

## 2013-05-19 MED ORDER — ACETAMINOPHEN 325 MG PO TABS
650.0000 mg | ORAL_TABLET | Freq: Once | ORAL | Status: AC
  Administered 2013-05-19: 650 mg via ORAL
  Filled 2013-05-19: qty 2

## 2013-05-19 MED ORDER — DIPHENHYDRAMINE HCL 50 MG/ML IJ SOLN
25.0000 mg | Freq: Once | INTRAMUSCULAR | Status: AC
  Administered 2013-05-19: 25 mg via INTRAVENOUS
  Filled 2013-05-19: qty 1

## 2013-05-19 MED ORDER — DEXAMETHASONE SODIUM PHOSPHATE 10 MG/ML IJ SOLN
10.0000 mg | Freq: Once | INTRAMUSCULAR | Status: AC
  Administered 2013-05-19: 10 mg via INTRAVENOUS
  Filled 2013-05-19: qty 1

## 2013-05-19 MED ORDER — ONDANSETRON 8 MG PO TBDP
8.0000 mg | ORAL_TABLET | Freq: Once | ORAL | Status: AC
  Administered 2013-05-19: 8 mg via ORAL
  Filled 2013-05-19: qty 1

## 2013-05-19 MED ORDER — PROCHLORPERAZINE EDISYLATE 5 MG/ML IJ SOLN
10.0000 mg | Freq: Four times a day (QID) | INTRAMUSCULAR | Status: DC | PRN
  Administered 2013-05-19: 10 mg via INTRAVENOUS
  Filled 2013-05-19: qty 2

## 2013-05-19 MED ORDER — LACTATED RINGERS IV BOLUS (SEPSIS)
1000.0000 mL | Freq: Once | INTRAVENOUS | Status: AC
  Administered 2013-05-19: 1000 mL via INTRAVENOUS

## 2013-05-19 NOTE — MAU Provider Note (Signed)
Attestation of Attending Supervision of Fellow: Evaluation and management procedures were performed by the Fellow under my supervision and collaboration.  I have reviewed the Fellow's note and chart, and I agree with the management and plan.    

## 2013-05-19 NOTE — MAU Note (Signed)
Patient states she tripped over her children's toys and fell on her side. States she has a little vaginal bleeding and a lot of pain. States she started having a headache while in the waiting room. States she has vomited x 3 today.

## 2013-05-19 NOTE — MAU Provider Note (Addendum)
First Provider Initiated Contact with Patient 05/19/13 1838      Chief Complaint:  Fall, Vaginal Bleeding and Headache   [name] is  33 y.o. [id number] at [redacted]w[redacted]d presents complaining of Fall, Vaginal Bleeding and Headache Pt reports she was ambulating to restroom when she tripped on children's toys. Pt fell on her right side, denies head trauma or loss of consciousness. Pt states that she is having a severe headache with nausea and vomiting after fall. With diffuse tightness and worse with light. Has not tried anything to improve the pain.  Pt has been spotting on and off during the pregnancy. Recently treated for trich. Reassuring [place] on 05/10/2013 without evidence of ectopic.    Obstetrical/Gynecological History: OB History   Grav Para Term Preterm Abortions TAB SAB Ect Mult Living   11 5 5  0 5 0 5 0 0 5     Past Medical History: Past Medical History  Diagnosis Date  . Hypertension   . Sickle cell trait   . Deliberate self-cutting   . Infection     MRSA in 1995, negative since  . Trichomonas infection   . Bipolar 1 disorder   . Schizophrenia     Past Surgical History: Past Surgical History  Procedure Laterality Date  . Dilation and curettage of uterus    . Plastic surgery on face    . Hemorroidectomy  2010    Family History: Family History  Problem Relation Age of Onset  . Anesthesia problems Neg Hx   . Cancer Mother   . Heart failure Mother   . Hypertension Mother   . Stroke Mother     Social History: History  Substance Use Topics  . Smoking status: Former Games developer  . Smokeless tobacco: Never Used  . Alcohol Use: No     Comment: hx drug use    Allergies:  Allergies  Allergen Reactions  . Amoxicillin Hives  . Latex Hives and Itching    Meds:  Prescriptions prior to admission  Medication Sig Dispense Refill  . ondansetron (ZOFRAN) 4 MG tablet Take 1 tablet (4 mg total) by mouth every 8 (eight) hours as needed for nausea or vomiting.  20 tablet  0   . Prenatal Vit-Fe Fumarate-FA (PRENATAL MULTIVITAMIN) TABS tablet Take 1 tablet by mouth daily at 12 noon.      . promethazine (PHENERGAN) 25 MG tablet Take 1 tablet (25 mg total) by mouth every 6 (six) hours as needed for nausea or vomiting.  30 tablet  0    Review of Systems -   Review of Systems  no f/c, no CP, no urinary symptoms    Physical Exam  Blood pressure 127/67, pulse 90, resp. rate 18, height 5' (1.524 m), weight 56.609 kg (124 lb 12.8 oz), last menstrual period 03/22/2013, SpO2 100.00%. GENERAL: Well-developed, well-nourished female in no acute distress.  LUNGS: Clear to auscultation bilaterally.  HEART: Regular rate and rhythm. ABDOMEN: minimally tender on right abdomen side of fall.  EXTREMITIES: Nontender, no edema, 2+ distal pulses. SSE: White thick discharge, no blood in vault.  Neuro: CN 2-12 intact, able to rotate head 90d L&R. Neg Kernigs and Brudzinski  Dilation: Closed Effacement (%): Thick Cervical Position: Posterior Exam by:: Dr. Ike Bene  Bedside US with FHT 171   Labs: Results for orders placed during the hospital encounter of 05/19/13 (from the past 24 hour(s))  URINALYSIS, ROUTINE W REFLEX MICROSCOPIC   Collection Time    05/19/13  5:55 PM  Result Value Ref Range   Color, Urine YELLOW  YELLOW   APPearance CLOUDY (*) CLEAR   Specific Gravity, Urine 1.010  1.005 - 1.030   pH 8.0  5.0 - 8.0   Glucose, UA NEGATIVE  NEGATIVE mg/dL   Hgb urine dipstick NEGATIVE  NEGATIVE   Bilirubin Urine NEGATIVE  NEGATIVE   Ketones, ur NEGATIVE  NEGATIVE mg/dL   Protein, ur NEGATIVE  NEGATIVE mg/dL   Urobilinogen, UA 0.2  0.0 - 1.0 mg/dL   Nitrite NEGATIVE  NEGATIVE   Leukocytes, UA NEGATIVE  NEGATIVE  URINE RAPID DRUG SCREEN (HOSP PERFORMED)   Collection Time    05/19/13  5:55 PM      Result Value Ref Range   Opiates NONE DETECTED  NONE DETECTED   Cocaine NONE DETECTED  NONE DETECTED   Benzodiazepines NONE DETECTED  NONE DETECTED   Amphetamines NONE  DETECTED  NONE DETECTED   Tetrahydrocannabinol POSITIVE (*) NONE DETECTED   Barbiturates NONE DETECTED  NONE DETECTED  CBC WITH DIFFERENTIAL   Collection Time    05/19/13  6:49 PM      Result Value Ref Range   WBC 16.4 (*) 4.0 - 10.5 K/uL   RBC 3.98  3.87 - 5.11 MIL/uL   Hemoglobin 12.2  12.0 - 15.0 g/dL   HCT 16.134.8 (*) 09.636.0 - 04.546.0 %   MCV 87.4  78.0 - 100.0 fL   MCH 30.7  26.0 - 34.0 pg   MCHC 35.1  30.0 - 36.0 g/dL   RDW 40.915.3  81.111.5 - 91.415.5 %   Platelets 205  150 - 400 K/uL   Neutrophils Relative % 87 (*) 43 - 77 %   Neutro Abs 14.3 (*) 1.7 - 7.7 K/uL   Lymphocytes Relative 7 (*) 12 - 46 %   Lymphs Abs 1.1  0.7 - 4.0 K/uL   Monocytes Relative 6  3 - 12 %   Monocytes Absolute 0.9  0.1 - 1.0 K/uL   Eosinophils Relative 0  0 - 5 %   Eosinophils Absolute 0.1  0.0 - 0.7 K/uL   Basophils Relative 0  0 - 1 %   Basophils Absolute 0.0  0.0 - 0.1 K/uL  HCG, QUANTITATIVE, PREGNANCY   Collection Time    05/19/13  6:49 PM      Result Value Ref Range   hCG, Beta Chain, Quant, [place] [id number] (*) <5 mIU/mL  WET PREP, GENITAL   Collection Time    05/19/13  7:35 PM      Result Value Ref Range   Yeast Wet Prep HPF POC MODERATE (*) NONE SEEN   Trich, Wet Prep NONE SEEN  NONE SEEN   Clue Cells Wet Prep HPF POC NONE SEEN  NONE SEEN   WBC, Wet Prep HPF POC FEW (*) NONE SEEN   Imaging Studies:  [place] Ob Comp Less 14 Wks  05/10/2013   CLINICAL DATA:  33 year old female with back pain, pelvic pain, and spotting. Estimated gestational age by LMP 7 weeks and 0 days. Quantitative beta HCG not specified.  EXAM: OBSTETRIC <14 WK ULTRASOUND  TECHNIQUE: Transabdominal ultrasound was performed for evaluation of the gestation as well as the maternal uterus and adnexal regions.  COMPARISON:  04/23/2013 and earlier.  FINDINGS: Intrauterine gestational sac: Single  Yolk sac:  Visible  Embryo:  Visible  Cardiac Activity: Detected  Heart Rate: 167 bpm  CRL: 2.70 cm (previously 8 mm) 9 w 4 d US EDC: 12/09/2013.  Maternal  uterus/adnexae: No subchorionic  hemorrhage or pelvic free fluid identified. Both ovaries appear stable and normal (1.9 x 2.3 x 1.5 cm on the left and 1.9 x 2.3 x 1.5 cm on the right).  IMPRESSION: Single living IUP demonstrated. No acute maternal findings visualized.   Electronically Signed   By: Augusto Gamble M.D.   On: 05/10/2013 09:31   EXAM:  CT HEAD WITHOUT CONTRAST  TECHNIQUE:  Contiguous axial images were obtained from the base of the skull  through the vertex without intravenous contrast.  COMPARISON: CT HEAD W/O CM dated 08/02/2009; CT HEAD W/O CM dated  02/08/2006  FINDINGS:  Multiple opacified right ethmoid air cells. Visualized sinuses  otherwise clear. Calvarium intact. No mass lesion. No midline shift.  No acute hemorrhage or hematoma. No extra-axial fluid collections.  No evidence of acute infarction.  IMPRESSION:  Ethmoid sinusitis on the right new from prior study. No acute  findings otherwise.   MDM: Pt with severe headache with acute onset of n/v following a fall. Could not rule out ICH, ordered CT scan. Report negative. Will give pt compazine 10mg , benadryl 25mg , decadron 10mg  and 1L LR  Assessment: BELLANY ELBAUM is  33 y.o. [id number] at [redacted]w[redacted]d presents with acute headache without evidence of acute ICH and reassuring PE and vitals against meningitis. Pt currently receiving migraine Traitement. Conveniently this will also treat her nausea. Cannot rule out concussion given symptoms. Pt will follow up in clinic next week on 05/25/2013 and can be reevaluated at that time.  Yeast infection - diflucan 150mg  x1  Care transferred to [place] at 2100. Will allow [place] to discharge.  Tawana Scale 2/11/20158:30 PM  Headache only decreased to 8/10 on pain scale after migraine cocktail. Discussed possible transfer to [facility] ED for further eval. Pt wants to go home. Discussed exam w/ Dr. Laureen Abrahams attending. Recommends discussing w/ ED MD. Dr. Glade Nurse at [facility] ED  does not think transfer is necessary at this time. Essentially normal CT rule out most serious causes of HA.    Skin feels hot. Temp rechecked. 100.1. WBCs 16.4.  Recommend transfer to [facility] ED. Pt agrees. Dr Norlene Campbell at [facility] ED accepts transfer of pt.   ASSESSMENT: 1. Traumatic injury during pregnancy in first trimester   2. Pregnancy headache in first trimester   3. Vulvovaginal candidiasis    PLAN: Transfer to [facility] ED.  [place], [place] 05/20/2013 12:29 AM

## 2013-05-19 NOTE — MAU Note (Signed)
Pt presents to MAU with c/o vaginal bleeding, abdominal pain and severe headache.

## 2013-05-20 ENCOUNTER — Encounter (HOSPITAL_COMMUNITY): Payer: Self-pay | Admitting: Emergency Medicine

## 2013-05-20 DIAGNOSIS — W010XXA Fall on same level from slipping, tripping and stumbling without subsequent striking against object, initial encounter: Secondary | ICD-10-CM

## 2013-05-20 DIAGNOSIS — R51 Headache: Secondary | ICD-10-CM

## 2013-05-20 DIAGNOSIS — O99891 Other specified diseases and conditions complicating pregnancy: Secondary | ICD-10-CM

## 2013-05-20 DIAGNOSIS — B373 Candidiasis of vulva and vagina: Secondary | ICD-10-CM

## 2013-05-20 DIAGNOSIS — B3731 Acute candidiasis of vulva and vagina: Secondary | ICD-10-CM

## 2013-05-20 DIAGNOSIS — O9989 Other specified diseases and conditions complicating pregnancy, childbirth and the puerperium: Principal | ICD-10-CM

## 2013-05-20 MED ORDER — PROMETHAZINE HCL 25 MG PO TABS: 25.0000 mg | ORAL_TABLET | Freq: Four times a day (QID) | ORAL | Status: DC | PRN

## 2013-05-20 MED ORDER — ACETAMINOPHEN 325 MG PO TABS
650.0000 mg | ORAL_TABLET | Freq: Once | ORAL | Status: AC
  Administered 2013-05-20: 650 mg via ORAL
  Filled 2013-05-20: qty 2

## 2013-05-20 MED ORDER — SODIUM CHLORIDE 0.9 % IV BOLUS (SEPSIS)
1000.0000 mL | Freq: Once | INTRAVENOUS | Status: AC
  Administered 2013-05-20: 1000 mL via INTRAVENOUS

## 2013-05-20 MED ORDER — ONDANSETRON HCL 4 MG PO TABS: 4.0000 mg | ORAL_TABLET | Freq: Three times a day (TID) | ORAL | Status: DC | PRN

## 2013-05-20 NOTE — Discharge Instructions (Signed)
Go to [facility] ED if your symptoms worsen.    Diet for Diarrhea, Adult Frequent, runny stools (diarrhea) may be caused or worsened by food or drink. Diarrhea may be relieved by changing your diet. Since diarrhea can last up to 7 days, it is easy for you to lose too much fluid from the body and become dehydrated. Fluids that are lost need to be replaced. Along with a modified diet, make sure you drink enough fluids to keep your urine clear or pale yellow. DIET INSTRUCTIONS  Ensure adequate fluid intake (hydration): have 1 cup (8 oz) of fluid for each diarrhea episode. Avoid fluids that contain simple sugars or sports drinks, fruit juices, whole milk products, and sodas. Your urine should be clear or pale yellow if you are drinking enough fluids. Hydrate with an oral rehydration solution that you can purchase at pharmacies, retail stores, and online. You can prepare an oral rehydration solution at home by mixing the following ingredients together:    tsp table salt.   tsp baking soda.   tsp salt substitute containing potassium chloride.  1  tablespoons sugar.  1 L (34 oz) of water.  Certain foods and beverages may increase the speed at which food moves through the gastrointestinal (GI) tract. These foods and beverages should be avoided and include:  Caffeinated and alcoholic beverages.  High-fiber foods, such as raw fruits and vegetables, nuts, seeds, and whole grain breads and cereals.  Foods and beverages sweetened with sugar alcohols, such as xylitol, sorbitol, and mannitol.  Some foods may be well tolerated and may help thicken stool including:  Starchy foods, such as rice, toast, pasta, low-sugar cereal, oatmeal, grits, baked potatoes, crackers, and bagels.   Bananas.   Applesauce.  Add probiotic-rich foods to help increase healthy bacteria in the GI tract, such as yogurt and fermented milk products. RECOMMENDED FOODS AND BEVERAGES Starches Choose foods with less than 2 g of  fiber per serving.  Recommended:  White, [place], and pita breads, plain rolls, buns, bagels. Plain muffins, matzo. Soda, saltine, or graham crackers. Pretzels, melba toast, zwieback. Cooked cereals made with water: cornmeal, farina, cream cereals. Dry cereals: refined corn, wheat, rice. Potatoes prepared any way without skins, refined macaroni, spaghetti, noodles, refined rice.  Avoid:  Bread, rolls, or crackers made with whole wheat, multi-grains, rye, bran seeds, nuts, or coconut. Corn tortillas or taco shells. Cereals containing whole grains, multi-grains, bran, coconut, nuts, raisins. Cooked or dry oatmeal. Coarse wheat cereals, granola. Cereals advertised as "high-fiber." Potato skins. Whole grain pasta, wild or brown rice. Popcorn. Sweet potatoes, yams. Sweet rolls, doughnuts, waffles, pancakes, sweet breads. Vegetables  Recommended: Strained tomato and vegetable juices. Most well-cooked and canned vegetables without seeds. Fresh: Tender lettuce, cucumber without the skin, cabbage, spinach, bean sprouts.  Avoid: Fresh, cooked, or canned: Artichokes, baked beans, beet greens, broccoli, Brussels sprouts, corn, kale, legumes, peas, sweet potatoes. Cooked: Green or red cabbage, spinach. Avoid large servings of any vegetables because vegetables shrink when cooked, and they contain more fiber per serving than fresh vegetables. Fruit  Recommended: Cooked or canned: Apricots, applesauce, cantaloupe, cherries, fruit cocktail, grapefruit, grapes, kiwi, mandarin oranges, peaches, pears, plums, watermelon. Fresh: Apples without skin, ripe banana, grapes, cantaloupe, cherries, grapefruit, peaches, oranges, plums. Keep servings limited to  cup or 1 piece.  Avoid: Fresh: Apples with skin, apricots, mangoes, pears, raspberries, strawberries. Prune juice, stewed or dried prunes. Dried fruits, raisins, dates. Large servings of all fresh fruits. Protein  Recommended: Ground or well-cooked tender beef,  ham,  veal, lamb, pork, or poultry. Eggs. Fish, oysters, shrimp, lobster, other seafoods. Liver, organ meats.  Avoid: Tough, fibrous meats with gristle. Peanut butter, smooth or chunky. Cheese, nuts, seeds, legumes, dried peas, beans, lentils. Dairy  Recommended: Yogurt, lactose-free milk, kefir, drinkable yogurt, buttermilk, soy milk, or plain hard cheese.  Avoid: Milk, chocolate milk, beverages made with milk, such as milkshakes. Soups  Recommended: Bouillon, broth, or soups made from allowed foods. Any strained soup.  Avoid: Soups made from vegetables that are not allowed, cream or milk-based soups. Desserts and Sweets  Recommended: Sugar-free gelatin, sugar-free frozen ice pops made without sugar alcohol.  Avoid: Plain cakes and cookies, pie made with fruit, pudding, custard, cream pie. Gelatin, fruit, ice, sherbet, frozen ice pops. Ice cream, ice milk without nuts. Plain hard candy, honey, jelly, molasses, syrup, sugar, chocolate syrup, gumdrops, marshmallows. Fats and Oils  Recommended: Limit fats to less than 8 tsp per day.  Avoid: Seeds, nuts, olives, avocados. Margarine, butter, cream, mayonnaise, salad oils, plain salad dressings. Plain gravy, crisp bacon without rind. Beverages  Recommended: Water, decaffeinated teas, oral rehydration solutions, sugar-free beverages not sweetened with sugar alcohols.  Avoid: Fruit juices, caffeinated beverages (coffee, tea, soda), alcohol, sports drinks, or lemon-lime soda. Condiments  Recommended: Ketchup, mustard, horseradish, vinegar, cocoa powder. Spices in moderation: allspice, basil, bay leaves, celery powder or leaves, cinnamon, cumin powder, curry powder, ginger, mace, marjoram, onion or garlic powder, oregano, paprika, parsley flakes, ground pepper, rosemary, sage, savory, tarragon, thyme, turmeric.  Avoid: Coconut, honey. Document Released: 06/15/2003 Document Revised: 12/18/2011 Document Reviewed: 08/09/2011 [facility] Patient  Information 2014 [place], [place].  Diarrhea Diarrhea is watery poop (stool). It can make you feel weak, tired, thirsty, or give you a dry mouth (signs of dehydration). Watery poop is a sign of another problem, most often an infection. It often lasts 2 3 days. It can last longer if it is a sign of something serious. Take care of yourself as told by your doctor. HOME CARE   Drink 1 cup (8 ounces) of fluid each time you have watery poop.  Do not drink the following fluids:  Those that contain simple sugars (fructose, glucose, galactose, lactose, sucrose, maltose).  Sports drinks.  Fruit juices.  Whole milk products.  Sodas.  Drinks with caffeine (coffee, tea, soda) or alcohol.  Oral rehydration solution may be used if the doctor says it is okay. You may make your own solution. Follow this recipe:    teaspoon table salt.   teaspoon baking soda.   teaspoon salt substitute containing potassium chloride.  1 tablespoons sugar.  1 liter (34 ounces) of water.  Avoid the following foods:  High fiber foods, such as raw fruits and vegetables.  Nuts, seeds, and whole grain breads and cereals.   Those that are sweetened with sugar alcohols (xylitol, sorbitol, mannitol).  Try eating the following foods:  Starchy foods, such as rice, toast, pasta, low-sugar cereal, oatmeal, baked potatoes, crackers, and bagels.  Bananas.  Applesauce.  Eat probiotic-rich foods, such as yogurt and milk products that are fermented.  Wash your hands well after each time you have watery poop.  Only take medicine as told by your doctor.  Take a warm bath to help lessen burning or pain from having watery poop. GET HELP RIGHT AWAY IF:   You cannot drink fluids without throwing up (vomiting).  You keep throwing up.  You have blood in your poop, or your poop looks black and tarry.  You do not pee (  urinate) in 6 8 hours, or there is only a small amount of very dark pee.  You have belly  (abdominal) pain that gets worse or stays in the same spot (localizes).  You are weak, dizzy, confused, or lightheaded.  You have a very bad headache.  Your watery poop gets worse or does not get better.  You have a fever or lasting symptoms for more than 2 3 days.  You have a fever and your symptoms suddenly get worse. MAKE SURE YOU:   Understand these instructions.  Will watch your condition.  Will get help right away if you are not doing well or get worse. Document Released: 09/11/2007 Document Revised: 12/18/2011 Document Reviewed: 12/01/2011 [employer] Patient Information 2014 [place], [place].  Nausea and Vomiting Nausea means you feel sick to your stomach. Throwing up (vomiting) is a reflex where stomach contents come out of your mouth. HOME CARE   Take medicine as told by your doctor.  Do not force yourself to eat. However, you do need to drink fluids.  If you feel like eating, eat a normal diet as told by your doctor.  Eat rice, wheat, potatoes, bread, lean meats, yogurt, fruits, and vegetables.  Avoid high-fat foods.  Drink enough fluids to keep your pee (urine) clear or pale yellow.  Ask your doctor how to replace body fluid losses (rehydrate). Signs of body fluid loss (dehydration) include:  Feeling very thirsty.  Dry lips and mouth.  Feeling dizzy.  Dark pee.  Peeing less than normal.  Feeling confused.  Fast breathing or heart rate. GET HELP RIGHT AWAY IF:   You have blood in your throw up.  You have black or bloody poop (stool).  You have a bad headache or stiff neck.  You feel confused.  You have bad belly (abdominal) pain.  You have chest pain or trouble breathing.  You do not pee at least once every 8 hours.  You have cold, clammy skin.  You keep throwing up after 24 to 48 hours.  You have a fever. MAKE SURE YOU:   Understand these instructions.  Will watch your condition.  Will get help right away if you are not doing well  or get worse. Document Released: 09/11/2007 Document Revised: 06/17/2011 Document Reviewed: 08/24/2010 [facility] Patient Information 2014 [place], [place].

## 2013-05-20 NOTE — MAU Provider Note (Signed)
Attestation of Attending Supervision of Advanced Practitioner (CNM/NP): Evaluation and management procedures were performed by the Advanced Practitioner under my supervision and collaboration.  I have reviewed the Advanced Practitioner's note and chart, and I agree with the management and plan.  HARRAWAY-SMITH, Montrey Buist 6:44 AM     

## 2013-05-20 NOTE — ED Provider Notes (Signed)
CSN: [id number]     Arrival date & time 05/19/13  1744 History   First MD Initiated Contact with Patient 05/20/13 0116     Chief Complaint  Patient presents with  . Fall  . Vaginal Bleeding  . Headache     (Consider location/radiation/quality/duration/timing/severity/associated sxs/prior Treatment) HPI 33 year old female transferred from [facility] hospital for headache.  She reports fall today after tripping on child's toy.  She did not strike her head.  She has had intermittent vaginal bleeding.  She is a G12 P5 at 10 weeks.  Per midwife at [facility] hospital, she has had a normal OB evaluation.  No bleeding noted.  She had persistent headache despite headache cocktail.  She had slight elevation in temp to 100.1.  She was transferred to 2 concern for her ongoing headache.  Patient reports she had nausea and vomiting.  At [facility] hospital, since being here at [facility] cone has had several episodes of diarrhea.  No known sick contacts, no unusual foods, no travel.  Patient reports that she is hungry and thirsty.  She reports her headache has almost completely resolved.  Headache was mainly right-sided.  Palpation of her scalp reproduces the pain.  It is not over the temporal region.  No neck stiffness or pain Past Medical History  Diagnosis Date  . Hypertension   . Sickle cell trait   . Deliberate self-cutting   . Infection     MRSA in 1995, negative since  . Trichomonas infection   . Bipolar 1 disorder   . Schizophrenia    Past Surgical History  Procedure Laterality Date  . Dilation and curettage of uterus    . Plastic surgery on face    . Hemorroidectomy  2010   Family History  Problem Relation Age of Onset  . Anesthesia problems Neg Hx   . Cancer Mother   . Heart failure Mother   . Hypertension Mother   . Stroke Mother    History  Substance Use Topics  . Smoking status: Former Games developer  . Smokeless tobacco: Never Used  . Alcohol Use: No     Comment: hx drug use   OB History    Grav Para Term Preterm Abortions TAB SAB Ect Mult Living   11 5 5  0 5 0 5 0 0 5     Review of Systems   See History of Present Illness; otherwise all other systems are reviewed and negative  Allergies  Amoxicillin and Latex  Home Medications   Current Outpatient Rx  Name  Route  Sig  Dispense  Refill  . Prenatal Vit-Fe Fumarate-FA (PRENATAL MULTIVITAMIN) TABS tablet   Oral   Take 1 tablet by mouth daily at 12 noon.         . ondansetron (ZOFRAN) 4 MG tablet   Oral   Take 1 tablet (4 mg total) by mouth every 8 (eight) hours as needed for nausea or vomiting.   20 tablet   0   . promethazine (PHENERGAN) 25 MG tablet   Oral   Take 1 tablet (25 mg total) by mouth every 6 (six) hours as needed for nausea or vomiting.   30 tablet   0    BP 113/74  Pulse 79  Temp(Src) 99.4 F (37.4 C) (Oral)  Resp 17  Ht 5' (1.524 m)  Wt 124 lb 12.8 oz (56.609 kg)  BMI 24.37 kg/m2  SpO2 100%  LMP 03/22/2013 Physical Exam  Nursing note and vitals reviewed. Constitutional: She is oriented  to person, place, and time. She appears well-developed and well-nourished.  HENT:  Head: Normocephalic and atraumatic.  Right Ear: External ear normal.  Left Ear: External ear normal.  Nose: Nose normal.  Mouth/Throat: Oropharynx is clear and moist.  Eyes: Conjunctivae and EOM are normal. Pupils are equal, round, and reactive to light.  Neck: Normal range of motion. Neck supple. No JVD present. No tracheal deviation present. No thyromegaly present.  Cardiovascular: Normal rate, regular rhythm, normal heart sounds and intact distal pulses.  Exam reveals no gallop and no friction rub.   No murmur heard. Pulmonary/Chest: Effort normal and breath sounds normal. No stridor. No respiratory distress. She has no wheezes. She has no rales. She exhibits no tenderness.  Abdominal: Soft. Bowel sounds are normal. She exhibits no distension and no mass. There is no tenderness. There is no rebound and no guarding.   Musculoskeletal: Normal range of motion. She exhibits no edema and no tenderness.  Lymphadenopathy:    She has no cervical adenopathy.  Neurological: She is alert and oriented to person, place, and time. She has normal reflexes. No cranial nerve deficit. She exhibits normal muscle tone. Coordination normal.  Skin: Skin is warm and dry. No rash noted. No erythema. No pallor.  Psychiatric: She has a normal mood and affect. Her behavior is normal. Judgment and thought content normal.    ED Course  Procedures (including critical care time) Labs Review Labs Reviewed  WET PREP, GENITAL - Abnormal; Notable for the following:    Yeast Wet Prep HPF POC MODERATE (*)    WBC, Wet Prep HPF POC FEW (*)    All other components within normal limits  URINALYSIS, ROUTINE W REFLEX MICROSCOPIC - Abnormal; Notable for the following:    APPearance CLOUDY (*)    All other components within normal limits  CBC WITH DIFFERENTIAL - Abnormal; Notable for the following:    WBC 16.4 (*)    HCT 34.8 (*)    Neutrophils Relative % 87 (*)    Neutro Abs 14.3 (*)    Lymphocytes Relative 7 (*)    All other components within normal limits  HCG, QUANTITATIVE, PREGNANCY - Abnormal; Notable for the following:    hCG, Beta Chain, Quant, S 16109 (*)    All other components within normal limits  URINE RAPID DRUG SCREEN (HOSP PERFORMED) - Abnormal; Notable for the following:    Tetrahydrocannabinol POSITIVE (*)    All other components within normal limits   Imaging Review Ct Head Wo Contrast  05/19/2013   CLINICAL DATA:  Patient is [redacted] weeks pregnant, shielded, recent fall, throbbing headache and dizziness since  EXAM: CT HEAD WITHOUT CONTRAST  TECHNIQUE: Contiguous axial images were obtained from the base of the skull through the vertex without intravenous contrast.  COMPARISON:  CT HEAD W/O CM dated 08/02/2009; CT HEAD W/O CM dated 02/08/2006  FINDINGS: Multiple opacified right ethmoid air cells. Visualized sinuses otherwise  clear. Calvarium intact. No mass lesion. No midline shift. No acute hemorrhage or hematoma. No extra-axial fluid collections. No evidence of acute infarction.  IMPRESSION: Ethmoid sinusitis on the right new from prior study. No acute findings otherwise.   Electronically Signed   By: Esperanza Heir M.D.   On: 05/19/2013 19:55    EKG Interpretation   None       MDM   Final diagnoses:  Nausea vomiting and diarrhea  Headache    33 year old female with nausea, vomiting, and diarrhea, and headache.  Suspect a mild viral  syndrome.  I do not feel her headache is indicative of a significant life-threatening and there is no signs of meningismus on exam.  Her neuro exam is normal.  I doubt sinus thrombosis or other critical causes for headache.  Plan to discharge home.   Olivia Mackielga M Hanny Elsberry, MD 05/20/13 [phone]

## 2013-05-20 NOTE — ED Notes (Signed)
Patient fell at home by tripping over a childs toy. No LOC. Patient is gravida 7112 Para 5. Patient has been cleared by womens. Patient is complaining of 7/10 headache.

## 2013-05-25 ENCOUNTER — Encounter: Payer: Medicaid Other | Admitting: Advanced Practice Midwife

## 2013-06-08 ENCOUNTER — Other Ambulatory Visit: Payer: Self-pay | Admitting: Obstetrics & Gynecology

## 2013-06-08 ENCOUNTER — Encounter (HOSPITAL_COMMUNITY): Payer: Self-pay

## 2013-06-08 ENCOUNTER — Ambulatory Visit (HOSPITAL_COMMUNITY)
Admission: RE | Admit: 2013-06-08 | Discharge: 2013-06-08 | Disposition: A | Discharge status: Home / Self Care | Payer: Medicaid Other | Source: Ambulatory Visit | Attending: Obstetrics & Gynecology | Admitting: Obstetrics & Gynecology

## 2013-06-08 ENCOUNTER — Other Ambulatory Visit: Payer: Self-pay

## 2013-06-08 ENCOUNTER — Ambulatory Visit (INDEPENDENT_AMBULATORY_CARE_PROVIDER_SITE_OTHER): Payer: Medicaid Other | Admitting: Obstetrics & Gynecology

## 2013-06-08 ENCOUNTER — Other Ambulatory Visit (HOSPITAL_COMMUNITY)
Admission: RE | Admit: 2013-06-08 | Discharge: 2013-06-08 | Disposition: A | Discharge status: Home / Self Care | Payer: Medicaid Other | Source: Ambulatory Visit | Attending: Obstetrics & Gynecology | Admitting: Obstetrics & Gynecology

## 2013-06-08 VITALS — BP 112/62 | Temp 98.5°F | Wt 127.8 lb

## 2013-06-08 DIAGNOSIS — O3510X0 Maternal care for (suspected) chromosomal abnormality in fetus, unspecified, not applicable or unspecified: Secondary | ICD-10-CM | POA: Insufficient documentation

## 2013-06-08 DIAGNOSIS — O209 Hemorrhage in early pregnancy, unspecified: Secondary | ICD-10-CM

## 2013-06-08 DIAGNOSIS — Z3689 Encounter for other specified antenatal screening: Secondary | ICD-10-CM | POA: Insufficient documentation

## 2013-06-08 DIAGNOSIS — Z01419 Encounter for gynecological examination (general) (routine) without abnormal findings: Secondary | ICD-10-CM | POA: Insufficient documentation

## 2013-06-08 DIAGNOSIS — Z3480 Encounter for supervision of other normal pregnancy, unspecified trimester: Secondary | ICD-10-CM

## 2013-06-08 DIAGNOSIS — O2620 Pregnancy care for patient with recurrent pregnancy loss, unspecified trimester: Secondary | ICD-10-CM | POA: Insufficient documentation

## 2013-06-08 DIAGNOSIS — Z3682 Encounter for antenatal screening for nuchal translucency: Secondary | ICD-10-CM

## 2013-06-08 DIAGNOSIS — O351XX0 Maternal care for (suspected) chromosomal abnormality in fetus, not applicable or unspecified: Secondary | ICD-10-CM | POA: Insufficient documentation

## 2013-06-08 DIAGNOSIS — Z3490 Encounter for supervision of normal pregnancy, unspecified, unspecified trimester: Secondary | ICD-10-CM | POA: Insufficient documentation

## 2013-06-08 DIAGNOSIS — Z1151 Encounter for screening for human papillomavirus (HPV): Secondary | ICD-10-CM | POA: Insufficient documentation

## 2013-06-08 DIAGNOSIS — Z113 Encounter for screening for infections with a predominantly sexual mode of transmission: Secondary | ICD-10-CM | POA: Insufficient documentation

## 2013-06-08 LAB — POCT URINALYSIS DIP (DEVICE)
Bilirubin Urine: NEGATIVE
GLUCOSE, UA: NEGATIVE mg/dL
KETONES UR: NEGATIVE mg/dL
LEUKOCYTES UA: NEGATIVE
Nitrite: NEGATIVE
PROTEIN: NEGATIVE mg/dL
Specific Gravity, Urine: 1.025 (ref 1.005–1.030)
UROBILINOGEN UA: 1 mg/dL (ref 0.0–1.0)
pH: 7 (ref 5.0–8.0)

## 2013-06-08 LAB — HIV ANTIBODY (ROUTINE TESTING W REFLEX): HIV: NONREACTIVE

## 2013-06-08 MED ORDER — PRENATAL MULTIVITAMIN CH: 1.0000 | ORAL_TABLET | Freq: Every day | ORAL | Status: DC

## 2013-06-08 NOTE — Progress Notes (Signed)
Vaginal bleeding and cramps, discharge. New OB, will have NT screen today  Subjective: blleding this morning    [name] is a [id number] 6221w3d being seen today for her first obstetrical visit.  Her obstetrical history is significant for previous miscarriages. Patient does intend to breast feed. Pregnancy history fully reviewed.  Patient reports bleeding and cramping.  Filed Vitals:   06/08/13 0848  BP: 112/62  Temp: 98.5 F (36.9 C)  Weight: 127 lb 12.8 oz (57.97 kg)    HISTORY: OB History  Gravida Para Term Preterm AB SAB TAB Ectopic Multiple Living  11 5 5  0 5 5 0 0 0 5    # Outcome Date GA Lbr Len/2nd Weight Sex Delivery Anes PTL Lv  11 CUR           10 TRM 04/28/11 3875w3d 07:06 / 00:12 7 lb (3.175 kg) M SVD EPI  Y     Comments: wnl  9 SAB           8 SAB           7 SAB           6 SAB           5 SAB           4 TRM           3 TRM           2 TRM           1 TRM              Past Medical History  Diagnosis Date  . Hypertension   . Sickle cell trait   . Deliberate self-cutting   . Infection     MRSA in 1995, negative since  . Trichomonas infection   . Bipolar 1 disorder   . Schizophrenia    Past Surgical History  Procedure Laterality Date  . Dilation and curettage of uterus    . Plastic surgery on face    . Hemorroidectomy  2010   Family History  Problem Relation Age of Onset  . Anesthesia problems Neg Hx   . Cancer Mother   . Heart failure Mother   . Hypertension Mother   . Stroke Mother      Exam    Uterus:  Fundal Height: 13 cm  Pelvic Exam:    Perineum: No Hemorrhoids   Vulva: normal   Vagina:  Pink discharge   pH:     Cervix: no lesions   Adnexa: normal adnexa   Bony Pelvis: average  System: Breast:  normal appearance, no masses or tenderness   Skin: normal coloration and turgor, no rashes    Neurologic: oriented, normal mood   Extremities: normal strength, tone, and muscle mass   HEENT neck supple with midline trachea and  thyroid without masses   Mouth/Teeth mucous membranes moist, pharynx normal without lesions and few    Neck supple   Cardiovascular: regular rate and rhythm   Respiratory:  appears well, vitals normal, no respiratory distress, acyanotic, normal RR   Abdomen: soft, non-tender; bowel sounds normal; no masses,  no organomegaly   Urinary: urethral meatus normal      Assessment:    Pregnancy: [id number] Patient Active Problem List   Diagnosis Date Noted  . Bleeding in early pregnancy 06/08/2013  . Trichomonas vaginalis (TV) infection 05/10/2013  . Normal delivery 04/28/2011        Plan:  Initial labs drawn. Prenatal vitamins. Problem list reviewed and updated. Genetic Screening discussed First Screen: ordered.  Ultrasound discussed; fetal survey: 5 weeks.  Follow up in 4 weeks. 50% of 30 min visit spent on counseling and coordination of care.  [place] today, bleeding precautions   ARNOLD,JAMES 06/08/2013

## 2013-06-08 NOTE — Progress Notes (Signed)
P=81  Pt reports bright red bleeding with tissue present this am @ 0430 am, continuous, pain/cramps left flank

## 2013-06-08 NOTE — Patient Instructions (Signed)
Vaginal Bleeding During Pregnancy  A small amount of bleeding from the vagina can happen anytime during pregnancy. It usually stops on its own. However, some bleeding can be serious. Be sure to tell your doctor about all vaginal bleeding.  HOME CARE   Get plenty of rest and sleep.   Stay in bed and only get up to go to the bathroom as told by your doctor.   Write down the number of pads you use each day. Note how soaked they are.   Do not use tampons. Do not clean the vagina with a stream of water (douche).   Do not have sex (intercourse) or put anything into your vagina. Have this approved by your doctor.   Save any tissue that comes from your vagina. Show it to your doctor.   Only take medicine as told by your doctor.   Follow your doctor's advice about lifting, driving, and physical activity.  GET HELP RIGHT AWAY IF:    You feel your baby move less or not at all.   You pass out (faint) while going to the bathroom.   You have more bleeding.   You start to have contractions.   You have severe cramps in your stomach, back, or belly (abdomen).   You are leaking fluid or have a gush of fluid from your vagina.   You become lightheaded or weak.   You have chills.   You have clumps of tissue or blood clots coming from your vagina.   You have a fever.  MAKE SURE YOU:    Understand these instructions.   Will watch your condition.   Will get help right away if you are not doing well or get worse.  Document Released: 01/02/2008 Document Revised: 03/11/2012 Document Reviewed: 01/13/2012  ExitCare Patient Information 2014 [employer].

## 2013-06-09 LAB — OBSTETRIC PANEL
Antibody Screen: NEGATIVE
BASOS ABS: 0 10*3/uL (ref 0.0–0.1)
Basophils Relative: 0 % (ref 0–1)
Eosinophils Absolute: 0.1 10*3/uL (ref 0.0–0.7)
Eosinophils Relative: 1 % (ref 0–5)
HEMATOCRIT: 33.3 % — AB (ref 36.0–46.0)
Hemoglobin: 11.2 g/dL — ABNORMAL LOW (ref 12.0–15.0)
Hepatitis B Surface Ag: NEGATIVE
Lymphocytes Relative: 23 % (ref 12–46)
Lymphs Abs: 1.9 10*3/uL (ref 0.7–4.0)
MCH: 30.3 pg (ref 26.0–34.0)
MCHC: 33.6 g/dL (ref 30.0–36.0)
MCV: 90 fL (ref 78.0–100.0)
Monocytes Absolute: 0.5 10*3/uL (ref 0.1–1.0)
Monocytes Relative: 6 % (ref 3–12)
NEUTROS ABS: 5.8 10*3/uL (ref 1.7–7.7)
Neutrophils Relative %: 70 % (ref 43–77)
PLATELETS: 200 10*3/uL (ref 150–400)
RBC: 3.7 MIL/uL — ABNORMAL LOW (ref 3.87–5.11)
RDW: 15.8 % — ABNORMAL HIGH (ref 11.5–15.5)
RUBELLA: 0.67 {index} (ref <0.90)
Rh Type: POSITIVE
WBC: 8.3 10*3/uL (ref 4.0–10.5)

## 2013-06-09 LAB — CULTURE, OB URINE
Colony Count: NO GROWTH
ORGANISM ID, BACTERIA: NO GROWTH

## 2013-06-10 LAB — PRESCRIPTION MONITORING PROFILE (19 PANEL)
Amphetamine/Meth: NEGATIVE ng/mL
BARBITURATE SCREEN, URINE: NEGATIVE ng/mL
BENZODIAZEPINE SCREEN, URINE: NEGATIVE ng/mL
Buprenorphine, Urine: NEGATIVE ng/mL
CARISOPRODOL, URINE: NEGATIVE ng/mL
Creatinine, Urine: 209.91 mg/dL (ref >20.0)
Fentanyl, Ur: NEGATIVE ng/mL
MDMA URINE: NEGATIVE ng/mL
Meperidine, Ur: NEGATIVE ng/mL
Methadone Screen, Urine: NEGATIVE ng/mL
Methaqualone: NEGATIVE ng/mL
NITRITES URINE, INITIAL: NEGATIVE ug/mL
OPIATE SCREEN, URINE: NEGATIVE ng/mL
Oxycodone Screen, Ur: NEGATIVE ng/mL
PROPOXYPHENE: NEGATIVE ng/mL
Phencyclidine, Ur: NEGATIVE ng/mL
TAPENTADOLUR: NEGATIVE ng/mL
Tramadol Scrn, Ur: NEGATIVE ng/mL
Zolpidem, Urine: NEGATIVE ng/mL
pH, Initial: 7.8 pH (ref 4.5–8.9)

## 2013-06-10 LAB — CANNABANOIDS (GC/LC/MS), URINE

## 2013-06-10 LAB — COCAINE METABOLITE (GC/LC/MS), URINE: BENZOYLECGONINE GC/MS CONF: 383 ng/mL — AB

## 2013-07-06 ENCOUNTER — Encounter: Payer: Medicaid Other | Admitting: Advanced Practice Midwife

## 2013-07-06 ENCOUNTER — Encounter: Payer: Self-pay | Admitting: Advanced Practice Midwife

## 2013-08-01 ENCOUNTER — Inpatient Hospital Stay (HOSPITAL_COMMUNITY): Payer: Medicaid Other

## 2013-08-01 ENCOUNTER — Encounter: Payer: Self-pay | Admitting: Family

## 2013-08-01 ENCOUNTER — Inpatient Hospital Stay (HOSPITAL_COMMUNITY)
Admission: AD | Admit: 2013-08-01 | Discharge: 2013-08-01 | Disposition: A | Discharge status: Home / Self Care | Payer: Medicaid Other | Source: Ambulatory Visit | Attending: Obstetrics & Gynecology | Admitting: Obstetrics & Gynecology

## 2013-08-01 ENCOUNTER — Encounter (HOSPITAL_COMMUNITY): Payer: Self-pay | Admitting: Family

## 2013-08-01 DIAGNOSIS — R109 Unspecified abdominal pain: Secondary | ICD-10-CM | POA: Insufficient documentation

## 2013-08-01 DIAGNOSIS — O2390 Unspecified genitourinary tract infection in pregnancy, unspecified trimester: Secondary | ICD-10-CM | POA: Insufficient documentation

## 2013-08-01 DIAGNOSIS — B373 Candidiasis of vulva and vagina: Secondary | ICD-10-CM | POA: Insufficient documentation

## 2013-08-01 DIAGNOSIS — Z3490 Encounter for supervision of normal pregnancy, unspecified, unspecified trimester: Secondary | ICD-10-CM

## 2013-08-01 DIAGNOSIS — F209 Schizophrenia, unspecified: Secondary | ICD-10-CM | POA: Insufficient documentation

## 2013-08-01 DIAGNOSIS — A499 Bacterial infection, unspecified: Secondary | ICD-10-CM | POA: Insufficient documentation

## 2013-08-01 DIAGNOSIS — O99340 Other mental disorders complicating pregnancy, unspecified trimester: Secondary | ICD-10-CM | POA: Insufficient documentation

## 2013-08-01 DIAGNOSIS — O368190 Decreased fetal movements, unspecified trimester, not applicable or unspecified: Secondary | ICD-10-CM | POA: Insufficient documentation

## 2013-08-01 DIAGNOSIS — O99322 Drug use complicating pregnancy, second trimester: Secondary | ICD-10-CM | POA: Insufficient documentation

## 2013-08-01 DIAGNOSIS — D573 Sickle-cell trait: Secondary | ICD-10-CM | POA: Insufficient documentation

## 2013-08-01 DIAGNOSIS — N760 Acute vaginitis: Secondary | ICD-10-CM | POA: Insufficient documentation

## 2013-08-01 DIAGNOSIS — B9689 Other specified bacterial agents as the cause of diseases classified elsewhere: Secondary | ICD-10-CM | POA: Insufficient documentation

## 2013-08-01 DIAGNOSIS — O99330 Smoking (tobacco) complicating pregnancy, unspecified trimester: Secondary | ICD-10-CM | POA: Insufficient documentation

## 2013-08-01 DIAGNOSIS — O99019 Anemia complicating pregnancy, unspecified trimester: Secondary | ICD-10-CM | POA: Insufficient documentation

## 2013-08-01 DIAGNOSIS — B3731 Acute candidiasis of vulva and vagina: Secondary | ICD-10-CM | POA: Insufficient documentation

## 2013-08-01 LAB — URINALYSIS, ROUTINE W REFLEX MICROSCOPIC
Bilirubin Urine: NEGATIVE
Glucose, UA: 100 mg/dL — AB
HGB URINE DIPSTICK: NEGATIVE
Ketones, ur: 15 mg/dL — AB
Leukocytes, UA: NEGATIVE
Nitrite: NEGATIVE
PROTEIN: NEGATIVE mg/dL
Specific Gravity, Urine: >1.03 — ABNORMAL HIGH (ref 1.005–1.030)
UROBILINOGEN UA: 1 mg/dL (ref 0.0–1.0)
pH: 6 (ref 5.0–8.0)

## 2013-08-01 LAB — WET PREP, GENITAL: Trich, Wet Prep: NONE SEEN

## 2013-08-01 LAB — HIV ANTIBODY (ROUTINE TESTING W REFLEX): HIV 1&2 Ab, 4th Generation: NONREACTIVE

## 2013-08-01 LAB — RAPID URINE DRUG SCREEN, HOSP PERFORMED
Amphetamines: NOT DETECTED
BARBITURATES: NOT DETECTED
Benzodiazepines: NOT DETECTED
Cocaine: POSITIVE — AB
OPIATES: NOT DETECTED
TETRAHYDROCANNABINOL: POSITIVE — AB

## 2013-08-01 LAB — RPR

## 2013-08-01 MED ORDER — CYCLOBENZAPRINE HCL 10 MG PO TABS: 10.0000 mg | ORAL_TABLET | Freq: Once | ORAL | Status: DC

## 2013-08-01 MED ORDER — FLUCONAZOLE 150 MG PO TABS
150.0000 mg | ORAL_TABLET | Freq: Once | ORAL | Status: AC
  Administered 2013-08-01: 150 mg via ORAL
  Filled 2013-08-01: qty 1

## 2013-08-01 MED ORDER — METRONIDAZOLE 500 MG PO TABS: 500.0000 mg | ORAL_TABLET | Freq: Two times a day (BID) | ORAL | Status: AC

## 2013-08-01 NOTE — MAU Provider Note (Signed)
First Provider Initiated Contact with Patient 08/01/13 1432      Chief Complaint:  Foreign Body in Vagina   [name] is  33 y.o. [id number] at 5468w1d presents complaining of Foreign Body in Vagina Difficulty to obtain detailed history from patient due to somnolence. She reports possibly having a glass crack pipe inside her vagina placed there by another woman.  She complains of lower abdominal pain and spotting.  No LOF. Decreased Fetal movement.    Obstetrical/Gynecological History: OB History   Grav Para Term Preterm Abortions TAB SAB Ect Mult Living   12 5 5  0 6 0 6 0 0 5     Past Medical History: Past Medical History  Diagnosis Date  . Hypertension   . Sickle cell trait   . Deliberate self-cutting   . Infection     MRSA in 1995, negative since  . Trichomonas infection   . Bipolar 1 disorder   . Schizophrenia     Past Surgical History: Past Surgical History  Procedure Laterality Date  . Dilation and curettage of uterus    . Plastic surgery on face    . Hemorroidectomy  2010    Family History: Family History  Problem Relation Age of Onset  . Anesthesia problems Neg Hx   . Cancer Mother   . Heart failure Mother   . Hypertension Mother   . Stroke Mother     Social History: History  Substance Use Topics  . Smoking status: Current Every Day Smoker  . Smokeless tobacco: Never Used  . Alcohol Use: No     Comment: hx drug use    Allergies:  Allergies  Allergen Reactions  . Amoxicillin Hives  . Latex Hives and Itching    Meds:  Prescriptions prior to admission  Medication Sig Dispense Refill  . Prenatal Vit-Fe Fumarate-FA (PRENATAL MULTIVITAMIN) TABS tablet Take 1 tablet by mouth daily at 12 noon.  30 tablet  6  . promethazine (PHENERGAN) 25 MG tablet Take 1 tablet (25 mg total) by mouth every 6 (six) hours as needed for nausea or vomiting.  30 tablet  0    Review of Systems -  unable to obtain complete ROS due to somnolence  Physical Exam   Blood pressure 117/71, pulse 75, temperature 97.6 F (36.4 C), temperature source Oral, resp. rate 15, last menstrual period 03/22/2013. GENERAL: Well-developed, well-nourished female in no acute distress, somnolent LUNGS: Clear to auscultation bilaterally.  HEART: Regular rate and rhythm. ABDOMEN: Soft, nontender, nondistended, gravid.  EXTREMITIES: Nontender, no edema, 2+ distal pulses. CERVICAL EXAM: No foreign body visualized in vagina or external os of cervix.  No bleeding. Whitish discharge. No vaginal lacerations seen.   Samples collected.  Doppler: heart tones present, FHR 145  Contractions: no palpable contractions   Labs:  Results for orders placed during the hospital encounter of 08/01/13 (from the past 24 hour(s))  URINALYSIS, ROUTINE W REFLEX MICROSCOPIC     Status: Abnormal   Collection Time    08/01/13  1:40 PM      Result Value Ref Range   Color, Urine YELLOW  YELLOW   APPearance HAZY (*) CLEAR   Specific Gravity, Urine >1.030 (*) 1.005 - 1.030   pH 6.0  5.0 - 8.0   Glucose, UA 100 (*) NEGATIVE mg/dL   Hgb urine dipstick NEGATIVE  NEGATIVE   Bilirubin Urine NEGATIVE  NEGATIVE   Ketones, ur 15 (*) NEGATIVE mg/dL   Protein, ur NEGATIVE  NEGATIVE mg/dL  Urobilinogen, UA 1.0  0.0 - 1.0 mg/dL   Nitrite NEGATIVE  NEGATIVE   Leukocytes, UA NEGATIVE  NEGATIVE  WET PREP, GENITAL     Status: Abnormal   Collection Time    08/01/13  2:30 PM      Result Value Ref Range   Yeast Wet Prep HPF POC FEW (*) NONE SEEN   Trich, Wet Prep NONE SEEN  NONE SEEN   Clue Cells Wet Prep HPF POC MANY (*) NONE SEEN   WBC, Wet Prep HPF POC FEW (*) NONE SEEN    Imaging Studies:  Anterior placenta, above cervical os.  AF: normal; cervical length:  3.4 cm.  No evidence of placental abnormality.    Assessment: 33 yo G12P5065 at 3741w1d wks IUP - Limited Prenatal Care Abdominal Pain in Pregnancy Incarcerated Bacterial Vaginosis Yeast Infection  Plan:  Diflucan 150 mg in MAU RX Flagyl  500 mg BID x 7 days Anatomy Ultrasound scheduled 08/05/13 1:30pm  [name] 4/26/20152:40 PM

## 2013-08-01 NOTE — MAU Note (Signed)
33 yo, G12P5 at 23104w1d, presents to MAU in custody of [employer] with c/o crack pipe placed in vagina on 4/25; reports bleeding when wiping. She has not felt FM in 10 days. No PNC.

## 2013-08-01 NOTE — MAU Provider Note (Signed)
I examined pt and agree with documentation above and resident plan of care.  Will route message to clinic to get patient in for follow-up prenatal care appointment. Eino FarberWalidah Paul HalfN Muhammad

## 2013-08-01 NOTE — Progress Notes (Signed)
Order for anatomy US received and scheduled for Thursday, April 30th at 1:30 pm.  Written nformation given to patient and Sheriff[employer] Deputy with phone number for Radiology Department scheduling if appointment needs to be rescheduled.

## 2013-08-01 NOTE — Discharge Instructions (Signed)

## 2013-08-02 LAB — HSV(HERPES SMPLX)ABS-I+II(IGG+IGM)-BLD
HSV 1 Glycoprotein G Ab, IgG: <0.1 IV
HSV 2 Glycoprotein G Ab, IgG: 3.91 IV — ABNORMAL HIGH
Herpes Simplex Vrs I&II-IgM Ab (EIA): 0.88 INDEX

## 2013-08-03 LAB — GC/CHLAMYDIA PROBE AMP
CT Probe RNA: NEGATIVE
GC Probe RNA: NEGATIVE

## 2013-08-05 ENCOUNTER — Ambulatory Visit (HOSPITAL_COMMUNITY): Admit: 2013-08-05 | Payer: Medicaid Other

## 2013-08-16 ENCOUNTER — Telehealth: Payer: Self-pay

## 2013-08-16 NOTE — Telephone Encounter (Signed)
Pt. Called stating she just got out of jail and has been hurting, having diarrhea and cramping. Would like to know if she should go to ED or not. Attempted to call pt. Heard patient's voicemail box is not set up yet. Unable to leave message.

## 2013-08-17 NOTE — Telephone Encounter (Signed)
Called patient and her mother answered stating she just went in to an office and she will have her call us back.

## 2013-08-18 ENCOUNTER — Encounter: Payer: Medicaid Other | Admitting: Advanced Practice Midwife

## 2013-09-01 ENCOUNTER — Ambulatory Visit (INDEPENDENT_AMBULATORY_CARE_PROVIDER_SITE_OTHER): Payer: Medicaid Other | Admitting: Advanced Practice Midwife

## 2013-09-01 ENCOUNTER — Encounter: Payer: Self-pay | Admitting: General Practice

## 2013-09-01 VITALS — BP 111/69 | HR 84 | Temp 97.3°F | Wt 131.5 lb

## 2013-09-01 DIAGNOSIS — F1910 Other psychoactive substance abuse, uncomplicated: Secondary | ICD-10-CM

## 2013-09-01 DIAGNOSIS — Z3490 Encounter for supervision of normal pregnancy, unspecified, unspecified trimester: Secondary | ICD-10-CM

## 2013-09-01 DIAGNOSIS — Z3480 Encounter for supervision of other normal pregnancy, unspecified trimester: Secondary | ICD-10-CM

## 2013-09-01 DIAGNOSIS — O99322 Drug use complicating pregnancy, second trimester: Secondary | ICD-10-CM

## 2013-09-01 DIAGNOSIS — N6009 Solitary cyst of unspecified breast: Secondary | ICD-10-CM

## 2013-09-01 DIAGNOSIS — O0930 Supervision of pregnancy with insufficient antenatal care, unspecified trimester: Secondary | ICD-10-CM

## 2013-09-01 DIAGNOSIS — O219 Vomiting of pregnancy, unspecified: Secondary | ICD-10-CM

## 2013-09-01 DIAGNOSIS — O99340 Other mental disorders complicating pregnancy, unspecified trimester: Secondary | ICD-10-CM

## 2013-09-01 LAB — POCT URINALYSIS DIP (DEVICE)
BILIRUBIN URINE: NEGATIVE
Glucose, UA: NEGATIVE mg/dL
Hgb urine dipstick: NEGATIVE
KETONES UR: NEGATIVE mg/dL
Leukocytes, UA: NEGATIVE
Nitrite: NEGATIVE
Protein, ur: NEGATIVE mg/dL
Specific Gravity, Urine: 1.02 (ref 1.005–1.030)
Urobilinogen, UA: 1 mg/dL (ref 0.0–1.0)
pH: 7 (ref 5.0–8.0)

## 2013-09-01 MED ORDER — PROMETHAZINE HCL 25 MG PO TABS: 25.0000 mg | ORAL_TABLET | Freq: Four times a day (QID) | ORAL | Status: DC | PRN

## 2013-09-01 MED ORDER — CYCLOBENZAPRINE HCL 10 MG PO TABS: 10.0000 mg | ORAL_TABLET | Freq: Three times a day (TID) | ORAL | Status: DC | PRN

## 2013-09-01 MED ORDER — PRENATAL MULTIVITAMIN CH: 1.0000 | ORAL_TABLET | Freq: Every day | ORAL | Status: DC

## 2013-09-01 NOTE — Progress Notes (Signed)
C/o of white itchy vaginal discharge; states she took diflucan but only 3 days of flagyl and is still experiencing discharge and discomfort.  C/o of "knott" on left breast with fluid leaking that "smells terrible."  C/o of lower pelvic pain/pressure and braxton hicks. C/o of sometimes having headaches and "dizzy spells."  Needs refill of phenergan because the prison lost prescription while patient was incarcerated.

## 2013-09-01 NOTE — Progress Notes (Signed)
S: Good fetal movement.  Pt has been incarcerated since her 13 week visit and has not had any other care.  She reports she has had intermittent vaginal spotting and vaginal discharge.  She took Diflucan and started Flagyl during her incarceration but did not finish the course.  She also reports abdominal pain in her right abdomen and pain and a mass in her left nipple that has occasional drainage with odor.  She has occasional dizzy spells and daily nausea keeping her from drinking fluids/eating regularly. She is out of phenergan.  Pt had intercourse last night.  O: Breast exam indicates 3cm round, tender, mobile, soft mass behind left nipple.   Abdominal tenderness in left inguinal area, no rebound tenderness, no tenderness at McBurney's point, no guarding Pelvic exam: Cervix pink, visually closed, without lesion, scant white creamy discharge, vaginal walls and external genitalia normal Cervix closed/thick/high/posterior.  Unable to collect FFN r/t recent intercourse. A: Left breast mass Round ligament pain N/V of pregnancy P: Breast U/S ordered Rest/ice/heat/Tylenol for round ligament pain and Flexeril Rx sent to pharmacy Phenergan Rx renewed PNV Rx renewed at pt request Wet prep pending, no treatment until results F/U in 2 weeks PTL precautions given  Sharen Counter Certified Nurse-Midwife

## 2013-09-02 LAB — WET PREP, GENITAL
TRICH WET PREP: NONE SEEN
YEAST WET PREP: NONE SEEN

## 2013-09-03 ENCOUNTER — Encounter: Payer: Self-pay | Admitting: General Practice

## 2013-09-03 ENCOUNTER — Ambulatory Visit
Admission: RE | Admit: 2013-09-03 | Discharge: 2013-09-03 | Disposition: A | Discharge status: Home / Self Care | Payer: Medicaid Other | Source: Ambulatory Visit | Attending: Advanced Practice Midwife | Admitting: Advanced Practice Midwife

## 2013-09-03 ENCOUNTER — Encounter (INDEPENDENT_AMBULATORY_CARE_PROVIDER_SITE_OTHER): Payer: Self-pay

## 2013-09-03 ENCOUNTER — Other Ambulatory Visit: Payer: Self-pay | Admitting: Advanced Practice Midwife

## 2013-09-03 DIAGNOSIS — O0930 Supervision of pregnancy with insufficient antenatal care, unspecified trimester: Secondary | ICD-10-CM

## 2013-09-03 DIAGNOSIS — N6009 Solitary cyst of unspecified breast: Secondary | ICD-10-CM

## 2013-09-07 ENCOUNTER — Telehealth: Payer: Self-pay | Admitting: Advanced Practice Midwife

## 2013-09-07 MED ORDER — SULFAMETHOXAZOLE-TMP DS 800-160 MG PO TABS: 1.0000 | ORAL_TABLET | Freq: Two times a day (BID) | ORAL | Status: DC

## 2013-09-07 NOTE — Telephone Encounter (Signed)
Reviewed breast u/s findings with pt. Bactrim Rx x7 days sent to pt pharmacy.  Pt given opportunity to ask questions.  Pt to keep next scheduled prenatal visits, return to clinic or MAU if symptoms worsen.

## 2013-09-10 ENCOUNTER — Ambulatory Visit (HOSPITAL_COMMUNITY)
Admission: RE | Admit: 2013-09-10 | Discharge: 2013-09-10 | Disposition: A | Discharge status: Home / Self Care | Payer: Medicaid Other | Source: Ambulatory Visit | Attending: Advanced Practice Midwife | Admitting: Advanced Practice Midwife

## 2013-09-10 ENCOUNTER — Encounter (HOSPITAL_COMMUNITY): Payer: Self-pay | Admitting: *Deleted

## 2013-09-10 ENCOUNTER — Inpatient Hospital Stay (HOSPITAL_COMMUNITY)
Admission: AD | Admit: 2013-09-10 | Discharge: 2013-09-10 | Disposition: A | Discharge status: Home / Self Care | Payer: Medicaid Other | Source: Ambulatory Visit | Attending: Obstetrics & Gynecology | Admitting: Obstetrics & Gynecology

## 2013-09-10 DIAGNOSIS — O2390 Unspecified genitourinary tract infection in pregnancy, unspecified trimester: Secondary | ICD-10-CM | POA: Insufficient documentation

## 2013-09-10 DIAGNOSIS — Z87891 Personal history of nicotine dependence: Secondary | ICD-10-CM | POA: Insufficient documentation

## 2013-09-10 DIAGNOSIS — B9689 Other specified bacterial agents as the cause of diseases classified elsewhere: Secondary | ICD-10-CM | POA: Insufficient documentation

## 2013-09-10 DIAGNOSIS — A499 Bacterial infection, unspecified: Secondary | ICD-10-CM

## 2013-09-10 DIAGNOSIS — R109 Unspecified abdominal pain: Secondary | ICD-10-CM | POA: Insufficient documentation

## 2013-09-10 DIAGNOSIS — Z3689 Encounter for other specified antenatal screening: Secondary | ICD-10-CM | POA: Insufficient documentation

## 2013-09-10 DIAGNOSIS — Z3490 Encounter for supervision of normal pregnancy, unspecified, unspecified trimester: Secondary | ICD-10-CM

## 2013-09-10 DIAGNOSIS — N760 Acute vaginitis: Secondary | ICD-10-CM

## 2013-09-10 DIAGNOSIS — O0930 Supervision of pregnancy with insufficient antenatal care, unspecified trimester: Secondary | ICD-10-CM | POA: Insufficient documentation

## 2013-09-10 HISTORY — DX: Unspecified convulsions: R56.9

## 2013-09-10 LAB — URINALYSIS, ROUTINE W REFLEX MICROSCOPIC
BILIRUBIN URINE: NEGATIVE
GLUCOSE, UA: NEGATIVE mg/dL
HGB URINE DIPSTICK: NEGATIVE
Ketones, ur: NEGATIVE mg/dL
Leukocytes, UA: NEGATIVE
Nitrite: NEGATIVE
PH: 8 (ref 5.0–8.0)
Protein, ur: NEGATIVE mg/dL
Specific Gravity, Urine: 1.015 (ref 1.005–1.030)
Urobilinogen, UA: 1 mg/dL (ref 0.0–1.0)

## 2013-09-10 LAB — WET PREP, GENITAL
TRICH WET PREP: NONE SEEN
Yeast Wet Prep HPF POC: NONE SEEN

## 2013-09-10 MED ORDER — METRONIDAZOLE 500 MG PO TABS: 500.0000 mg | ORAL_TABLET | Freq: Two times a day (BID) | ORAL | Status: AC

## 2013-09-10 NOTE — Discharge Instructions (Signed)

## 2013-09-10 NOTE — MAU Provider Note (Signed)
Chief Complaint:  Abdominal Pain and Vaginal Discharge   [name] is a 33 y.o.  [id number] with IUP at 66w6dpresenting for Abdominal Pain and Vaginal Discharge  Pt presents for lower abdominal pain/dysuria since this am and increased vaginal discharge with odor.  No hematuria, increased urinary frequency, urinary hesitancy.  No ctx. No vb. Lof. Good FM.  She believes she has BV because this feels like her previous infection.    [facility] Seen at 13 weeks and then 25 weeks.     Menstrual History: OB History   Grav Para Term Preterm Abortions TAB SAB Ect Mult Living   _0 0 6 0 6 0 0 5       Patient's last menstrual period was 03/22/2013.      Past Medical History  Diagnosis Date  . Hypertension   . Sickle cell trait   . Deliberate self-cutting   . Infection     MRSA in 1995, negative since  . Trichomonas infection   . Bipolar 1 disorder   . Schizophrenia   . Seizures     Not recently    Past Surgical History  Procedure Laterality Date  . Dilation and curettage of uterus    . Plastic surgery on face    . Hemorroidectomy  2010    Family History  Problem Relation Age of Onset  . Anesthesia problems Neg Hx   . Cancer Mother   . Heart failure Mother   . Hypertension Mother   . Stroke Mother     History  Substance Use Topics  . Smoking status: Former Smoker    Quit date: 06/10/2013  . Smokeless tobacco: Never Used  . Alcohol Use: No     Comment: hx drug use      Allergies  Allergen Reactions  . Peanuts [Peanut Oil] Anaphylaxis  . Amoxicillin Hives  . Latex Hives and Itching    Prescriptions prior to admission  Medication Sig Dispense Refill  . cyclobenzaprine (FLEXERIL) 10 MG tablet Take 1 tablet (10 mg total) by mouth 3 (three) times daily as needed for muscle spasms.  30 tablet  1  . Prenatal Vit-Fe Fumarate-FA (PRENATAL MULTIVITAMIN) TABS tablet Take 1 tablet by mouth daily at 12 noon.  30 tablet  6  . promethazine  (PHENERGAN) 25 MG tablet Take 1 tablet (25 mg total) by mouth every 6 (six) hours as needed for nausea or vomiting.  30 tablet  0  . sulfamethoxazole-trimethoprim (BACTRIM DS) 800-160 MG per tablet Take 1 tablet by mouth 2 (two) times daily.  14 tablet  0    Review of Systems - Negative except for what is mentioned in HPI.  Physical Exam  Blood pressure 113/74, pulse 79, temperature 98.1 F (36.7 C), temperature source Oral, resp. rate 18, height 5' 1" (1.549 m), weight 60.782 kg (134 lb), last menstrual period 03/22/2013. GENERAL: Well-developed, well-nourished female in no acute distress.  LUNGS: Clear to auscultation bilaterally.  HEART: Regular rate and rhythm. ABDOMEN: Soft, nontender, nondistended, gravid.  GU: NEFG, slight increase in discharge. Normal cervix and gravid uterus.   EXTREMITIES: Nontender, no edema, 2+ distal pulses. Cervical Exam: Dilatation 0cm   Effacement thick%   Station -3    FHT:  Baseline rate 135 bpm   Variability moderate  Accelerations present   Decelerations none Contractions: none   Labs: Results for orders placed during the hospital encounter of 09/10/13 (from the past 24 hour(s))  URINALYSIS,  ROUTINE W REFLEX MICROSCOPIC   Collection Time    09/10/13 10:30 AM      Result Value Ref Range   Color, Urine YELLOW  YELLOW   APPearance CLEAR  CLEAR   Specific Gravity, Urine 1.015  1.005 - 1.030   pH 8.0  5.0 - 8.0   Glucose, UA NEGATIVE  NEGATIVE mg/dL   Hgb urine dipstick NEGATIVE  NEGATIVE   Bilirubin Urine NEGATIVE  NEGATIVE   Ketones, ur NEGATIVE  NEGATIVE mg/dL   Protein, ur NEGATIVE  NEGATIVE mg/dL   Urobilinogen, UA 1.0  0.0 - 1.0 mg/dL   Nitrite NEGATIVE  NEGATIVE   Leukocytes, UA NEGATIVE  NEGATIVE  WET PREP, GENITAL   Collection Time    09/10/13 11:25 AM      Result Value Ref Range   Yeast Wet Prep HPF POC NONE SEEN  NONE SEEN   Trich, Wet Prep NONE SEEN  NONE SEEN   Clue Cells Wet Prep HPF POC MODERATE (*) NONE SEEN   WBC, Wet Prep  HPF POC FEW (*) NONE SEEN    Imaging Studies:  US Ob Detail + 14 Wk  09/10/2013   OBSTETRICAL ULTRASOUND: This exam was performed within a [facility]. The OB US report was generated in the [facility] system, and faxed to the ordering physician.   This report is available in the [employer]. See the [facility] Obstetric US report via the Image Link.  US Breast Ltd Uni Left Inc Axilla  09/03/2013   CLINICAL DATA:  Nine month history of tender left breast lump which is increasing in size. The patient is pregnant.  EXAM: ULTRASOUND OF THE LEFT BREAST  COMPARISON:  None.  FINDINGS: On physical exam,I palpate a firm, readily mobile mass in the 10 o'clock position of the left breast, 1 cm from the nipple. No skin erythema is present.  Ultrasound is performed, showing under oval heterogeneously hypoechoic mass with circumscribed margins and posterior acoustic enhancement is 10 o'clock position, 1 cm from the nipple measuring 1.4 x 2.4 x 0.9 cm.  IMPRESSION: Mass, left breast for which a biopsy is done and reported separately.  RECOMMENDATION: Ultrasound-guided left breast biopsy.  I have discussed the findings and recommendations with the patient. Results were also provided in writing at the conclusion of the visit. If applicable, a reminder letter will be sent to the patient regarding the next appointment.  BI-RADS CATEGORY  4: Suspicious.   Electronically Signed   By: Duke Salvia M.D.   On: 09/03/2013 11:05   [place] Lt Breast Bx W Loc Dev 1st Lesion Img Bx Spec US Guide  09/07/2013   ADDENDUM REPORT: 09/06/2013 11:28  ADDENDUM: Pathology revealed benign breast tissue with mixed acute and chronic inflammation consistent with abscess in the left breast. This was found to be concordant by Dr. Enrique Sack. Pathology was relayed to the patient by telephone. The patient reported that her the biopsied breast mass was painful. Post biopsy instructions were reviewed and her questions were answered. I called [name] CNM, at the [facility], to notify her of the result. Since the patient is pregnant, [name] will prescribe antibiotics for the patient. She is to return for a left breast ultrasound 2 weeks after completing her antibiotics, or sooner, should her symptoms worsen.  Pathology results reported by Susa Raring RN, BSN on September 06, 2013.   Electronically Signed   By: Duke Salvia M.D.   On: 09/06/2013 11:28  09/07/2013   CLINICAL DATA:  Left breast mass  EXAM: ULTRASOUND GUIDED LEFT BREAST CORE NEEDLE BIOPSY  COMPARISON:  Previous exams.  FINDINGS: I met with the patient and we discussed the procedure of ultrasound-guided biopsy, including benefits and alternatives. We discussed the high likelihood of a successful procedure. We discussed the risks of the procedure, including infection, bleeding, tissue injury, and inadequate sampling. Informed written consent was given. The usual time-out protocol was performed immediately prior to the procedure.  Using sterile technique and 2% Lidocaine as local anesthetic, under direct ultrasound visualization, attempt is made aspiration although, no fluid is returned. Then, a 14 gauge spring-loaded device was used to perform biopsy of the mass using a medial approach. No tissue marker is placed.  IMPRESSION: Ultrasound guided biopsy of a left breast mass. No apparent complications.  Electronically Signed: By: Duke Salvia M.D. On: 09/03/2013 11:06    Assessment: [name] is  33 y.o. [id number] at 66w6dpresents with Abdominal Pain and Vaginal Discharge .  Plan: Wet prep + for BV - gc/chl pending - will treat with flagyl.   - SVE closed/thick/high  - FWB- reassuring for GA  Reassurance provided. PTL precautions discussed.    L  6/5/201512:37 PM

## 2013-09-10 NOTE — MAU Note (Signed)
Patient states she is having lower abdominal pain since this am. Has a vaginal discharge with an odor. Denies bleeding or leaking. Reports good fetal movement.

## 2013-09-11 LAB — GC/CHLAMYDIA PROBE AMP
CT Probe RNA: NEGATIVE
GC PROBE AMP APTIMA: NEGATIVE

## 2013-09-17 ENCOUNTER — Encounter: Payer: Medicaid Other | Admitting: Advanced Practice Midwife

## 2013-10-04 ENCOUNTER — Other Ambulatory Visit: Payer: Self-pay | Admitting: Advanced Practice Midwife

## 2013-10-04 DIAGNOSIS — N611 Abscess of the breast and nipple: Secondary | ICD-10-CM

## 2013-10-05 ENCOUNTER — Inpatient Hospital Stay (HOSPITAL_COMMUNITY)
Admission: AD | Admit: 2013-10-05 | Discharge: 2013-10-05 | Disposition: A | Discharge status: Home / Self Care | Payer: Medicaid Other | Source: Ambulatory Visit | Attending: Obstetrics & Gynecology | Admitting: Obstetrics & Gynecology

## 2013-10-05 ENCOUNTER — Encounter (HOSPITAL_COMMUNITY): Payer: Self-pay | Admitting: General Practice

## 2013-10-05 DIAGNOSIS — O99891 Other specified diseases and conditions complicating pregnancy: Secondary | ICD-10-CM | POA: Diagnosis not present

## 2013-10-05 DIAGNOSIS — O26899 Other specified pregnancy related conditions, unspecified trimester: Secondary | ICD-10-CM

## 2013-10-05 DIAGNOSIS — N898 Other specified noninflammatory disorders of vagina: Secondary | ICD-10-CM | POA: Diagnosis present

## 2013-10-05 DIAGNOSIS — Z87891 Personal history of nicotine dependence: Secondary | ICD-10-CM | POA: Diagnosis not present

## 2013-10-05 DIAGNOSIS — O9989 Other specified diseases and conditions complicating pregnancy, childbirth and the puerperium: Secondary | ICD-10-CM

## 2013-10-05 DIAGNOSIS — R109 Unspecified abdominal pain: Secondary | ICD-10-CM | POA: Insufficient documentation

## 2013-10-05 DIAGNOSIS — O219 Vomiting of pregnancy, unspecified: Secondary | ICD-10-CM

## 2013-10-05 DIAGNOSIS — Z3490 Encounter for supervision of normal pregnancy, unspecified, unspecified trimester: Secondary | ICD-10-CM

## 2013-10-05 LAB — WET PREP, GENITAL
Clue Cells Wet Prep HPF POC: NONE SEEN
Trich, Wet Prep: NONE SEEN
YEAST WET PREP: NONE SEEN

## 2013-10-05 LAB — RAPID URINE DRUG SCREEN, HOSP PERFORMED
Amphetamines: NOT DETECTED
Barbiturates: NOT DETECTED
Benzodiazepines: NOT DETECTED
Cocaine: NOT DETECTED
Opiates: NOT DETECTED
Tetrahydrocannabinol: NOT DETECTED

## 2013-10-05 LAB — URINALYSIS, ROUTINE W REFLEX MICROSCOPIC
Bilirubin Urine: NEGATIVE
GLUCOSE, UA: NEGATIVE mg/dL
Hgb urine dipstick: NEGATIVE
Ketones, ur: NEGATIVE mg/dL
LEUKOCYTES UA: NEGATIVE
Nitrite: NEGATIVE
PROTEIN: NEGATIVE mg/dL
Specific Gravity, Urine: 1.01 (ref 1.005–1.030)
Urobilinogen, UA: 0.2 mg/dL (ref 0.0–1.0)
pH: 7 (ref 5.0–8.0)

## 2013-10-05 LAB — OB RESULTS CONSOLE GC/CHLAMYDIA
Chlamydia: NEGATIVE
Gonorrhea: NEGATIVE

## 2013-10-05 MED ORDER — ACETAMINOPHEN 325 MG PO TABS
650.0000 mg | ORAL_TABLET | Freq: Once | ORAL | Status: AC
  Administered 2013-10-05: 650 mg via ORAL
  Filled 2013-10-05: qty 2

## 2013-10-05 MED ORDER — PROMETHAZINE HCL 25 MG PO TABS
25.0000 mg | ORAL_TABLET | Freq: Once | ORAL | Status: AC
Start: 2013-10-05 — End: 2013-10-05
  Administered 2013-10-05: 25 mg via ORAL
  Filled 2013-10-05: qty 1

## 2013-10-05 MED ORDER — PROMETHAZINE HCL 25 MG PO TABS: 25.0000 mg | ORAL_TABLET | Freq: Four times a day (QID) | ORAL | Status: DC | PRN

## 2013-10-05 NOTE — MAU Note (Signed)
Pt presents to MAU via stretcher by EMS with c/o loss of mucous plug, contractions, nausea, headaches, left breast hurting

## 2013-10-05 NOTE — Discharge Instructions (Signed)
Abdominal Pain During Pregnancy °Belly (abdominal) pain is common during pregnancy. Most of the time, it is not a serious problem. Other times, it can be a sign that something is wrong with the pregnancy. Always tell your doctor if you have belly pain. °HOME CARE °Monitor your belly pain for any changes. The following actions may help you feel better: °· Do not have sex (intercourse) or put anything in your vagina until you feel better. °· Rest until your pain stops. °· Drink clear fluids if you feel sick to your stomach (nauseous). Do not eat solid food until you feel better. °· Only take medicine as told by your doctor. °· Keep all doctor visits as told. °GET HELP RIGHT AWAY IF:  °· You are bleeding, leaking fluid, or pieces of tissue come out of your vagina. °· You have more pain or cramping. °· You keep throwing up (vomiting). °· You have pain when you pee (urinate) or have blood in your pee. °· You have a fever. °· You do not feel your baby moving as much. °· You feel very weak or feel like passing out. °· You have trouble breathing, with or without belly pain. °· You have a very bad headache and belly pain. °· You have fluid leaking from your vagina and belly pain. °· You keep having watery poop (diarrhea). °· Your belly pain does not go away after resting, or the pain gets worse. °MAKE SURE YOU:  °· Understand these instructions. °· Will watch your condition. °· Will get help right away if you are not doing well or get worse. °Document Released: 03/13/2009 Document Revised: 11/25/2012 Document Reviewed: 10/22/2012 °Exit[employer]® Patient Information ©2015 [employer]. This information is not intended to replace advice given to you by your health care provider. Make sure you discuss any questions you have with your health care provider. ° °

## 2013-10-05 NOTE — MAU Provider Note (Signed)
History     CSN: [id number]  Arrival date and time: 10/05/13 1104   First [name] Initiated Contact with Patient 10/05/13 1129      Chief Complaint  Patient presents with  . Contractions   HPI [name] is a 33 y.o. [id number] at 5514w3d who presents to MAU by EMS today with complaint of vaginal discharge, vaginal pain, nausea, headache and left breast pain. The patient has had left breast pain since prior to her last visit. She has an US scheduled at [facility] on Thursday. The pain is unchanged. She states that she has been having a creamy white discharge recently and pain in her vagina. She initially states that she is having frequent contractions, but then states that the pain is in her vagina and not her abdomen. She endorses headache and nausea without vomiting today. She has not eaten anything since last night. She has a history of drug abuse.   OB History   Grav Para Term Preterm Abortions TAB SAB Ect Mult Living   12 5 5  0 6 0 6 0 0 5      Past Medical History  Diagnosis Date  . Hypertension   . Sickle cell trait   . Deliberate self-cutting   . Infection     MRSA in 1995, negative since  . Trichomonas infection   . Bipolar 1 disorder   . Schizophrenia   . Seizures     Not recently    Past Surgical History  Procedure Laterality Date  . Dilation and curettage of uterus    . Plastic surgery on face    . Hemorroidectomy  2010    Family History  Problem Relation Age of Onset  . Anesthesia problems Neg Hx   . Cancer Mother   . Heart failure Mother   . Hypertension Mother   . Stroke Mother     History  Substance Use Topics  . Smoking status: Former Smoker    Quit date: 06/10/2013  . Smokeless tobacco: Never Used  . Alcohol Use: No     Comment: hx drug use    Allergies:  Allergies  Allergen Reactions  . Peanuts [Peanut Oil] Anaphylaxis  . Amoxicillin Hives  . Latex Hives and Itching    Prescriptions prior to admission  Medication  Sig Dispense Refill  . cyclobenzaprine (FLEXERIL) 10 MG tablet Take 1 tablet (10 mg total) by mouth 3 (three) times daily as needed for muscle spasms.  30 tablet  1  . Prenatal Vit-Fe Fumarate-FA (PRENATAL MULTIVITAMIN) TABS tablet Take 1 tablet by mouth daily at 12 noon.  30 tablet  6  . [DISCONTINUED] promethazine (PHENERGAN) 25 MG tablet Take 1 tablet (25 mg total) by mouth every 6 (six) hours as needed for nausea or vomiting.  30 tablet  0    Review of Systems  Constitutional: Negative for fever and malaise/fatigue.  Gastrointestinal: Positive for nausea. Negative for vomiting and abdominal pain.  Genitourinary:       Neg - vaginal bleeding + vaginal discharge  Neurological: Positive for dizziness and headaches. Negative for loss of consciousness.   Physical Exam   Blood pressure 104/60, pulse 138, temperature 98.5 F (36.9 C), temperature source Oral, last menstrual period 03/22/2013, SpO2 69.00%.  Physical Exam  Constitutional: She is oriented to person, place, and time. She appears well-developed and well-nourished. No distress.  HENT:  Head: Normocephalic and atraumatic.  Cardiovascular: Normal rate.   Respiratory: Effort normal.  GI: Soft. She  exhibits no distension and no mass. There is no tenderness. There is no rebound and no guarding.  Genitourinary: Uterus is enlarged (appropriate for GA). Uterus is not tender. Cervix exhibits no motion tenderness, no discharge and no friability. No bleeding around the vagina. Vaginal discharge (small amount of thin, white discharge noted) found.  Neg - pooling  Neurological: She is alert and oriented to person, place, and time.  Skin: Skin is warm and dry. No erythema.  Psychiatric: She has a normal mood and affect.  Dilation: 1 Effacement (%): Thick Cervical Position: Posterior Station: Ballotable Exam by:: Judeth HornErin Lawrence, FNPS  Crist FatFern - negative  Results for orders placed during the hospital encounter of 10/05/13 (from the past 24  hour(s))  URINALYSIS, ROUTINE W REFLEX MICROSCOPIC     Status: None   Collection Time    10/05/13 11:25 AM      Result Value Ref Range   Color, Urine YELLOW  YELLOW   APPearance CLEAR  CLEAR   Specific Gravity, Urine 1.010  1.005 - 1.030   pH 7.0  5.0 - 8.0   Glucose, UA NEGATIVE  NEGATIVE mg/dL   Hgb urine dipstick NEGATIVE  NEGATIVE   Bilirubin Urine NEGATIVE  NEGATIVE   Ketones, ur NEGATIVE  NEGATIVE mg/dL   Protein, ur NEGATIVE  NEGATIVE mg/dL   Urobilinogen, UA 0.2  0.0 - 1.0 mg/dL   Nitrite NEGATIVE  NEGATIVE   Leukocytes, UA NEGATIVE  NEGATIVE  WET PREP, GENITAL     Status: Abnormal   Collection Time    10/05/13 11:39 AM      Result Value Ref Range   Yeast Wet Prep HPF POC NONE SEEN  NONE SEEN   Trich, Wet Prep NONE SEEN  NONE SEEN   Clue Cells Wet Prep HPF POC NONE SEEN  NONE SEEN   WBC, Wet Prep HPF POC FEW (*) NONE SEEN    Fetal Monitoring: Baseline: 130 bpm, moderate variability, + accelerations, no decelerations Contractions: none, mild UI  MAU Course  Procedures None  MDM UA, UDS, Wet prep and GC/Chlamydia today Patient has outpatient breast US scheduled on 10/07/13 Will order outpatient follow-up US for first available Negative Fern, negative wet prep, no contractions noted. Cervix is 1 cm, patient has had 5 other babies, last was SVD.  Patient given phenergan and tylenol in MAU. Patient reports resolution of nausea and is tolerating PO here today. Reports pain is 8/10 which is better than 10/10 initially.   Assessment and Plan  A: Abdominal pain in pregnancy  P: Discharge home Rx for Phenergan sent to patient's pharmacy Patient advised to use Tylenol PRN for pain Patient may use previously prescribed Flexeril PRN Advised to use maternity belt for support Outpatient follow-up US for anatomy ordered. They will call the patient with an appointment Confirmed date/time for breast US [facility] to [facility]che[facility] missed [facility]  appointments Patient may return to MAU as needed or if her condition were to change or worsen  Freddi StarrJulie N Ethier, PA-C  10/05/2013, 12:53 PM

## 2013-10-06 ENCOUNTER — Other Ambulatory Visit: Payer: Self-pay

## 2013-10-06 ENCOUNTER — Other Ambulatory Visit: Payer: Self-pay | Admitting: Advanced Practice Midwife

## 2013-10-06 DIAGNOSIS — N611 Abscess of the breast and nipple: Secondary | ICD-10-CM

## 2013-10-06 LAB — GC/CHLAMYDIA PROBE AMP
CT Probe RNA: NEGATIVE
GC Probe RNA: NEGATIVE

## 2013-10-07 ENCOUNTER — Ambulatory Visit (INDEPENDENT_AMBULATORY_CARE_PROVIDER_SITE_OTHER): Payer: Medicaid Other | Admitting: Surgery

## 2013-10-07 ENCOUNTER — Encounter: Payer: Self-pay | Admitting: Obstetrics and Gynecology

## 2013-10-07 ENCOUNTER — Encounter: Payer: Medicaid Other | Admitting: Obstetrics and Gynecology

## 2013-10-07 ENCOUNTER — Encounter (INDEPENDENT_AMBULATORY_CARE_PROVIDER_SITE_OTHER): Payer: Self-pay | Admitting: Surgery

## 2013-10-07 ENCOUNTER — Ambulatory Visit
Admission: RE | Admit: 2013-10-07 | Discharge: 2013-10-07 | Disposition: A | Discharge status: Home / Self Care | Payer: Medicaid Other | Source: Ambulatory Visit | Attending: Advanced Practice Midwife | Admitting: Advanced Practice Midwife

## 2013-10-07 VITALS — BP 110/60 | HR 63 | Temp 98.7°F | Resp 16 | Ht 63.0 in | Wt 137.0 lb

## 2013-10-07 DIAGNOSIS — N61 Mastitis without abscess: Secondary | ICD-10-CM

## 2013-10-07 DIAGNOSIS — N611 Abscess of the breast and nipple: Secondary | ICD-10-CM

## 2013-10-07 MED ORDER — CLINDAMYCIN HCL 150 MG PO CAPS: 150.0000 mg | ORAL_CAPSULE | Freq: Three times a day (TID) | ORAL | Status: DC

## 2013-10-07 MED ORDER — HYDROCODONE-ACETAMINOPHEN 5-325 MG PO TABS: 1.0000 | ORAL_TABLET | Freq: Four times a day (QID) | ORAL | Status: DC | PRN

## 2013-10-07 NOTE — Progress Notes (Signed)
Subjective:     Patient ID: [name], female   DOB: 05-Nov-1980, 33 y.o.   MRN: [id number]  HPI This is a pleasant female who is sent here for evaluation of a left breast abscess. This is been chronic in nature for over a year. She's had a previous ultrasound showing an abscess collection in the 11:00 position of the left breast. She has been on multiple antibiotics without improvement. It will occasionally drain on its own.  Review of Systems     Objective:   Physical Exam On exam, there is a fluctuant area in the subareolar at the 11:00 position of left breast. After discussion with her, I prepped the area Betadine, anesthetized with lidocaine, made an incision with a scalpel and drained the abscess cavity completely. I then packed it with gauze    Assessment:     Chronic left breast abscess     Plan:     Wound care instructions were given. I will strong clindamycin and wrote her for a few hydrocodone. Hopefully this will pacify the area to her pregnancy and then we can consider excising this area if it remains persistent

## 2013-10-13 ENCOUNTER — Encounter: Payer: Self-pay | Admitting: Family

## 2013-10-13 ENCOUNTER — Ambulatory Visit (INDEPENDENT_AMBULATORY_CARE_PROVIDER_SITE_OTHER): Payer: Medicaid Other | Admitting: Family

## 2013-10-13 VITALS — BP 140/85 | HR 101 | Temp 97.6°F | Wt 139.7 lb

## 2013-10-13 DIAGNOSIS — O99340 Other mental disorders complicating pregnancy, unspecified trimester: Secondary | ICD-10-CM

## 2013-10-13 DIAGNOSIS — Z3490 Encounter for supervision of normal pregnancy, unspecified, unspecified trimester: Secondary | ICD-10-CM

## 2013-10-13 DIAGNOSIS — N61 Mastitis without abscess: Secondary | ICD-10-CM

## 2013-10-13 DIAGNOSIS — Z3480 Encounter for supervision of other normal pregnancy, unspecified trimester: Secondary | ICD-10-CM

## 2013-10-13 DIAGNOSIS — N898 Other specified noninflammatory disorders of vagina: Secondary | ICD-10-CM

## 2013-10-13 DIAGNOSIS — Z3483 Encounter for supervision of other normal pregnancy, third trimester: Secondary | ICD-10-CM

## 2013-10-13 DIAGNOSIS — Z23 Encounter for immunization: Secondary | ICD-10-CM

## 2013-10-13 DIAGNOSIS — N611 Abscess of the breast and nipple: Secondary | ICD-10-CM | POA: Insufficient documentation

## 2013-10-13 LAB — CBC
HCT: 31 % — ABNORMAL LOW (ref 36.0–46.0)
Hemoglobin: 10.9 g/dL — ABNORMAL LOW (ref 12.0–15.0)
MCH: 30.5 pg (ref 26.0–34.0)
MCHC: 35.2 g/dL (ref 30.0–36.0)
MCV: 86.8 fL (ref 78.0–100.0)
Platelets: 192 10*3/uL (ref 150–400)
RBC: 3.57 MIL/uL — ABNORMAL LOW (ref 3.87–5.11)
RDW: 15.1 % (ref 11.5–15.5)
WBC: 10.9 10*3/uL — ABNORMAL HIGH (ref 4.0–10.5)

## 2013-10-13 LAB — POCT URINALYSIS DIP (DEVICE)
BILIRUBIN URINE: NEGATIVE
Glucose, UA: NEGATIVE mg/dL
HGB URINE DIPSTICK: NEGATIVE
KETONES UR: NEGATIVE mg/dL
Nitrite: NEGATIVE
Protein, ur: NEGATIVE mg/dL
SPECIFIC GRAVITY, URINE: 1.015 (ref 1.005–1.030)
Urobilinogen, UA: 2 mg/dL — ABNORMAL HIGH (ref 0.0–1.0)
pH: 8.5 — ABNORMAL HIGH (ref 5.0–8.0)

## 2013-10-13 MED ORDER — FLUCONAZOLE 150 MG PO TABS: 150.0000 mg | ORAL_TABLET | Freq: Once | ORAL | Status: DC

## 2013-10-13 MED ORDER — TETANUS-DIPHTH-ACELL PERTUSSIS 5-2.5-18.5 LF-MCG/0.5 IM SUSP: 0.5000 mL | Freq: Once | INTRAMUSCULAR | Status: DC

## 2013-10-13 NOTE — Addendum Note (Signed)
Addended by: Candelaria StagersHAIZLIP, Pegi Milazzo E on: 10/13/2013 10:01 AM   Modules accepted: Orders

## 2013-10-13 NOTE — Progress Notes (Signed)
Reports white discharge with sore labia.  Recently completed antibiotics due to breast abscess.  Drained by surgeon 10/07/13.  Wet prep collected, RX for Diflucan sent.  Growth US scheduled for 10/18/13.  Declines 1 hr.  Agrees to do jelly bean challenge at next visit.  Appears somnolent at visit.  Random UDS.  At discharge pt refuses to complete jelly bean test.  Do random glucose at next visit.

## 2013-10-13 NOTE — Progress Notes (Signed)
C/o a lot of white vaginal discharge and labia are sore. Refused 1 hour GTT today, states will do everything else.

## 2013-10-13 NOTE — Addendum Note (Signed)
Addended by: Melissa NoonMUHAMMAD, Tagan Bartram N on: 10/13/2013 10:58 AM   Modules accepted: Orders

## 2013-10-14 LAB — WET PREP, GENITAL
Clue Cells Wet Prep HPF POC: NONE SEEN
Trich, Wet Prep: NONE SEEN
WBC WET PREP: NONE SEEN
Yeast Wet Prep HPF POC: NONE SEEN

## 2013-10-14 LAB — HIV ANTIBODY (ROUTINE TESTING W REFLEX): HIV 1&2 Ab, 4th Generation: NONREACTIVE

## 2013-10-14 LAB — RPR

## 2013-10-15 ENCOUNTER — Ambulatory Visit (INDEPENDENT_AMBULATORY_CARE_PROVIDER_SITE_OTHER): Payer: Medicaid Other | Admitting: Surgery

## 2013-10-15 ENCOUNTER — Encounter (INDEPENDENT_AMBULATORY_CARE_PROVIDER_SITE_OTHER): Payer: Self-pay | Admitting: Surgery

## 2013-10-15 VITALS — BP 128/72 | HR 77 | Temp 97.8°F | Ht 63.0 in | Wt 138.0 lb

## 2013-10-15 DIAGNOSIS — Z09 Encounter for follow-up examination after completed treatment for conditions other than malignant neoplasm: Secondary | ICD-10-CM

## 2013-10-15 NOTE — Progress Notes (Signed)
Subjective:     Patient ID: [name], female   DOB: 04-Jun-1980, 33 y.o.   MRN: [id number]  HPI She is here for a postop visit status post incision and drainage of a left breast abscess. This has been a chronic abscess. She is finishing her course of clindamycin. She still has some discomfort  Review of Systems     Objective:   Physical Exam On exam, the incision is healing well. There is minimal induration and no erythema and no purulence    Assessment:     Patient stable status post incision and drainage of left breast abscess     Plan:     Again, explained to her that I would not formally excise this area secondary to her pregnancy. Hopefully this will resolve without need for any surgery. I will see her as needed unless this flares back up. She will finish the course of antibiotic she is on.

## 2013-10-18 ENCOUNTER — Ambulatory Visit (HOSPITAL_COMMUNITY): Admission: RE | Admit: 2013-10-18 | Payer: Medicaid Other | Source: Ambulatory Visit

## 2013-10-27 ENCOUNTER — Ambulatory Visit (HOSPITAL_COMMUNITY)
Admission: RE | Admit: 2013-10-27 | Discharge: 2013-10-27 | Disposition: A | Discharge status: Home / Self Care | Payer: Medicaid Other | Source: Ambulatory Visit | Attending: Family | Admitting: Family

## 2013-10-27 DIAGNOSIS — Z3689 Encounter for other specified antenatal screening: Secondary | ICD-10-CM | POA: Diagnosis not present

## 2013-10-27 DIAGNOSIS — Z3483 Encounter for supervision of other normal pregnancy, third trimester: Secondary | ICD-10-CM

## 2013-10-28 ENCOUNTER — Encounter: Payer: Self-pay | Admitting: Family

## 2013-10-29 ENCOUNTER — Encounter: Payer: Self-pay | Admitting: General Practice

## 2013-10-29 ENCOUNTER — Encounter: Payer: Medicaid Other | Admitting: Obstetrics & Gynecology

## 2013-10-29 ENCOUNTER — Encounter: Payer: Self-pay | Admitting: Obstetrics & Gynecology

## 2013-11-11 ENCOUNTER — Emergency Department (HOSPITAL_COMMUNITY)
Admission: EM | Admit: 2013-11-11 | Discharge: 2013-11-11 | Disposition: A | Discharge status: Home / Self Care | Payer: Medicaid Other | Attending: Emergency Medicine | Admitting: Emergency Medicine

## 2013-11-11 ENCOUNTER — Encounter (HOSPITAL_COMMUNITY): Payer: Self-pay | Admitting: Emergency Medicine

## 2013-11-11 DIAGNOSIS — Z862 Personal history of diseases of the blood and blood-forming organs and certain disorders involving the immune mechanism: Secondary | ICD-10-CM | POA: Diagnosis not present

## 2013-11-11 DIAGNOSIS — Z880 Allergy status to penicillin: Secondary | ICD-10-CM | POA: Diagnosis not present

## 2013-11-11 DIAGNOSIS — Z91040 Latex allergy status: Secondary | ICD-10-CM | POA: Diagnosis not present

## 2013-11-11 DIAGNOSIS — O169 Unspecified maternal hypertension, unspecified trimester: Secondary | ICD-10-CM | POA: Insufficient documentation

## 2013-11-11 DIAGNOSIS — Z87891 Personal history of nicotine dependence: Secondary | ICD-10-CM | POA: Insufficient documentation

## 2013-11-11 DIAGNOSIS — G8929 Other chronic pain: Secondary | ICD-10-CM | POA: Insufficient documentation

## 2013-11-11 DIAGNOSIS — Z8659 Personal history of other mental and behavioral disorders: Secondary | ICD-10-CM | POA: Insufficient documentation

## 2013-11-11 DIAGNOSIS — L259 Unspecified contact dermatitis, unspecified cause: Secondary | ICD-10-CM | POA: Diagnosis not present

## 2013-11-11 DIAGNOSIS — O210 Mild hyperemesis gravidarum: Secondary | ICD-10-CM | POA: Diagnosis not present

## 2013-11-11 DIAGNOSIS — Z8614 Personal history of Methicillin resistant Staphylococcus aureus infection: Secondary | ICD-10-CM | POA: Diagnosis not present

## 2013-11-11 DIAGNOSIS — Z8619 Personal history of other infectious and parasitic diseases: Secondary | ICD-10-CM | POA: Insufficient documentation

## 2013-11-11 DIAGNOSIS — O9989 Other specified diseases and conditions complicating pregnancy, childbirth and the puerperium: Secondary | ICD-10-CM | POA: Insufficient documentation

## 2013-11-11 HISTORY — DX: Unspecified abdominal pain: R10.9

## 2013-11-11 HISTORY — DX: Other chronic pain: G89.29

## 2013-11-11 HISTORY — DX: Nausea: R11.0

## 2013-11-11 LAB — COMPREHENSIVE METABOLIC PANEL
ALK PHOS: 124 U/L — AB (ref 39–117)
ALT: 7 U/L (ref 0–35)
AST: 14 U/L (ref 0–37)
Albumin: 2.4 g/dL — ABNORMAL LOW (ref 3.5–5.2)
Anion gap: 12 (ref 5–15)
BUN: 6 mg/dL (ref 6–23)
CO2: 20 mEq/L (ref 19–32)
CREATININE: 0.53 mg/dL (ref 0.50–1.10)
Calcium: 8.3 mg/dL — ABNORMAL LOW (ref 8.4–10.5)
Chloride: 106 mEq/L (ref 96–112)
GFR calc Af Amer: >90 mL/min (ref >90)
GFR calc non Af Amer: >90 mL/min (ref >90)
Glucose, Bld: 107 mg/dL — ABNORMAL HIGH (ref 70–99)
POTASSIUM: 3.7 meq/L (ref 3.7–5.3)
Sodium: 138 mEq/L (ref 137–147)
TOTAL PROTEIN: 5.7 g/dL — AB (ref 6.0–8.3)
Total Bilirubin: 0.7 mg/dL (ref 0.3–1.2)

## 2013-11-11 LAB — CBC WITH DIFFERENTIAL/PLATELET
Basophils Absolute: 0 10*3/uL (ref 0.0–0.1)
Basophils Relative: 0 % (ref 0–1)
Eosinophils Absolute: 0.1 10*3/uL (ref 0.0–0.7)
Eosinophils Relative: 1 % (ref 0–5)
HEMATOCRIT: 33.3 % — AB (ref 36.0–46.0)
HEMOGLOBIN: 11.3 g/dL — AB (ref 12.0–15.0)
LYMPHS PCT: 21 % (ref 12–46)
Lymphs Abs: 2.1 10*3/uL (ref 0.7–4.0)
MCH: 30.7 pg (ref 26.0–34.0)
MCHC: 33.9 g/dL (ref 30.0–36.0)
MCV: 90.5 fL (ref 78.0–100.0)
MONO ABS: 0.8 10*3/uL (ref 0.1–1.0)
MONOS PCT: 8 % (ref 3–12)
Neutro Abs: 7.1 10*3/uL (ref 1.7–7.7)
Neutrophils Relative %: 70 % (ref 43–77)
Platelets: 179 10*3/uL (ref 150–400)
RBC: 3.68 MIL/uL — ABNORMAL LOW (ref 3.87–5.11)
RDW: 14.4 % (ref 11.5–15.5)
WBC: 10 10*3/uL (ref 4.0–10.5)

## 2013-11-11 LAB — URINALYSIS, ROUTINE W REFLEX MICROSCOPIC
Bilirubin Urine: NEGATIVE
GLUCOSE, UA: NEGATIVE mg/dL
Hgb urine dipstick: NEGATIVE
Ketones, ur: NEGATIVE mg/dL
Nitrite: NEGATIVE
Protein, ur: NEGATIVE mg/dL
SPECIFIC GRAVITY, URINE: 1.02 (ref 1.005–1.030)
Urobilinogen, UA: 4 mg/dL — ABNORMAL HIGH (ref 0.0–1.0)
pH: 7 (ref 5.0–8.0)

## 2013-11-11 LAB — LIPASE, BLOOD: Lipase: 25 U/L (ref 11–59)

## 2013-11-11 LAB — URINE MICROSCOPIC-ADD ON

## 2013-11-11 MED ORDER — ONDANSETRON HCL 4 MG/2ML IJ SOLN
4.0000 mg | INTRAMUSCULAR | Status: DC | PRN
  Administered 2013-11-11: 4 mg via INTRAVENOUS
  Filled 2013-11-11: qty 2

## 2013-11-11 MED ORDER — ONDANSETRON HCL 4 MG PO TABS: 4.0000 mg | ORAL_TABLET | Freq: Three times a day (TID) | ORAL | Status: DC | PRN

## 2013-11-11 MED ORDER — DIPHENHYDRAMINE HCL 25 MG PO CAPS
50.0000 mg | ORAL_CAPSULE | Freq: Once | ORAL | Status: AC
  Administered 2013-11-11: 50 mg via ORAL
  Filled 2013-11-11: qty 2

## 2013-11-11 MED ORDER — DIPHENHYDRAMINE HCL 25 MG PO TABS
50.0000 mg | ORAL_TABLET | Freq: Four times a day (QID) | ORAL | Status: DC | PRN
Start: 2013-11-11 — End: 2014-09-15

## 2013-11-11 MED ORDER — SODIUM CHLORIDE 0.9 % IV BOLUS (SEPSIS)
500.0000 mL | Freq: Once | INTRAVENOUS | Status: AC
  Administered 2013-11-11: 500 mL via INTRAVENOUS

## 2013-11-11 MED ORDER — SODIUM CHLORIDE 0.9 % IV SOLN
INTRAVENOUS | Status: DC
  Administered 2013-11-11: 18:00:00 via INTRAVENOUS

## 2013-11-11 NOTE — Progress Notes (Signed)
Spoke to Dr Marcie BalJ Arnold, OB Attending. Pt has Cat 1 FH strip. Baby remains active with FH 135, spont accels, no decels. Pt cleared obstetrically. To keep next OB appointment. Reviewed signs and symptoms of labor and ROM. Pt has no OB questions at this time.

## 2013-11-11 NOTE — ED Notes (Signed)
Pt c/o rash all over body, sts she has also been feeling lightheaded and had n/v and lower abd pain. Denies diarrhea. Pt is [redacted] weeks pregnant. Pt sts she has been followed by and OB but missed her last appointment. Nad, skin warm and dry, resp e/u.

## 2013-11-11 NOTE — Discharge Instructions (Signed)
°Emergency Department Resource Guide °1) Find a Doctor and Pay Out of Pocket °Although you won't have to find out who is covered by your insurance plan, it is a good idea to ask around and get recommendations. You will then need to call the office and see if the doctor you have chosen will accept you as a new patient and what types of options they offer for patients who are self-pay. Some doctors offer discounts or will set up payment plans for their patients who do not have insurance, but you will need to ask so you aren't surprised when you get to your appointment. ° °2) Contact Your Local Health Department °Not all health departments have doctors that can see patients for sick visits, but many do, so it is worth a call to see if yours does. If you don't know where your local health department is, you can check in your phone book. The CDC also has a tool to help you locate your state's health department, and many state websites also have listings of all of their local health departments. ° °3) Find a Walk-in Clinic °If your illness is not likely to be very severe or complicated, you may want to try a walk in clinic. These are popping up all over the country in pharmacies, drugstores, and shopping centers. They're usually staffed by nurse practitioners or physician assistants that have been trained to treat common illnesses and complaints. They're usually fairly quick and inexpensive. However, if you have serious medical issues or chronic medical problems, these are probably not your best option. ° °No Primary Care Doctor: °- Call [facility] at  832-8000 - they can help you locate a primary care doctor that  accepts your insurance, provides certain services, etc. °- Physician Referral Service- 1-800-533-3463 ° °Chronic Pain Problems: °Organization         Address  Phone   Notes  °[facility]  (336) [phone] Patients need to be referred by their primary care doctor.  ° °Medication  Assistance: °Organization         Address  Phone   Notes  °[name] [facility] [address] °[place], [place] [address] (336) 641-8030 --Must be a resident of [name] °-- Must have NO insurance coverage whatsoever (no Medicaid/ Medicare, etc.) °-- The pt. MUST have a primary care doctor that directs their care regularly and follows them in the community °  °[facility]  (866) 331-1348   °[employer]  (888) 892-1162   ° °Agencies that provide inexpensive medical care: °Organization         Address  Phone   Notes  °[facility]  (336) 832-8035   °[facility]    (336) 832-7272   °[facility] [address] °[place], [place] [address] (336) 832-4777   °[facility] [address], °[place] (336) 271-4999   °[facility]    (336) 373-0678   °[facility]    (336) 272-1050   °[facility] ° [address], [place] Phone:  (336) 832-4444, Fax:  (336) 832-4440 Hours of Operation:  9 am - 6 pm, M-F.  Also accepts Medicaid/Medicare and self-pay.  °[facility] ° [address], [place] Phone: (336) [phone], Fax: (336) [phone]. Hours of Operation:  8:30 am - 5:30 pm, M-F.  Also accepts Medicaid and self-pay.  °[facility] [place] [address], [place] Phone: (336) 878-6027   °[facility] [address], [place], [place] (336)723-1848, Ext. 123 Mondays & Thursdays: 7-9 AM.  First 15 patients are seen on a first come, first serve basis. °  ° °Medicaid-accepting [name] Providers: ° °Organization         Address  Phone   Notes  °[facility] [address], [place] (336) 641-2100 Also accepts self-pay patients.  °[facility] [address], [place] ° (336) 856-9996   °[facility] [address], [place]  (336) 288-8857   °[facility] [address] [place] [address], [place] (336) 299-7000   °Veita Bland [address], [place]  ° (336) 373-1557 Only accepts [place] Access Medicaid patients after they have their name applied to their card.  ° °Self-Pay (no insurance) in [name]: ° °Organization         Address  Phone   Notes  °Sickle Cell Patients, [facility] [address], [place] (336) 832-1970   °[facility] [address], [place] (336) 832-4400   °[facility] ° [address], [place] (336) 992-4800   °[facility]/Dr. Osei-Bonsu ° [address] [place] [address], [place] or [address], [place] (336) [phone] Phone number for both [place] and [place] locations is the same.  °[facility] [address], [place] (336) 299-0000   °[facility] [address] [place] [address], [place] or [address] (336) 852-7530 °(336) 878-2260   °[facility] [address], [place] (336) 350-1642, phone; (336) 294-5005, fax Sees patients 1st and 3rd Saturday of every month.  Must not qualify for public or private insurance (i.e. Medicaid, Medicare, [place] Health Choice, Veterans' Benefits) • Household income should be no more than 200% of the poverty level •The clinic cannot treat you if you are pregnant or think you are pregnant • Sexually transmitted diseases are not treated at the clinic.  ° ° °Dental Care: °Organization         Address  Phone  Notes  °[name] [facility] [address], [place] (336) 641-6152 Accepts children up to age 21 who are enrolled in Medicaid or [place] Health Choice; pregnant women with a Medicaid card; and children who have applied for Medicaid or [place] Health Choice, but were declined, whose parents can pay a reduced fee at time of service.  °[name]  [facility] [place]  [address], [place] (336) 641-7733 Accepts children up to age 21 who are enrolled in Medicaid or [place] Health Choice; pregnant women with a Medicaid card; and children who have applied for Medicaid or [place] Health Choice, but were declined, whose parents can pay a reduced fee at time of service.  °[facility] ° [address], [place] (336) 641-4533 Patients are seen by appointment only. Walk-ins are not accepted. [facility] will see patients 18 years of age and older. °Monday - Tuesday (8am-5pm) °Most Wednesdays (8:30-5pm) °$30 per visit, cash only  °[facility] ° [address], [place] (336) 641-4533 Patients are seen by appointment only. Walk-ins are not accepted. [facility] will see patients 18 years of age and older. °One   Wednesday Evening (Monthly: Volunteer Based).  $30 per visit, cash only  °[facility]  (919) 537-3737 for adults; Children under age 4, call Graduate Pediatric Dentistry at ([phone]) [phone]. Children aged 4-14, please call ([phone]) [phone] to request a pediatric application. ° Dental services are provided in all areas of dental care including fillings, crowns and bridges, complete and partial dentures, implants, gum treatment, root canals, and extractions. Preventive care is also provided. Treatment is provided to both adults and children. °Patients are selected via a lottery and there is often a waiting list. °  °[facility] [address], °[place] ° (336) 763-8833 [email or web address] °  °[facility] [address], [place], [place] (336)723-1848, Ext. 123 Second and Fourth Thursday of each month, opens at 6:30 AM; Clinic ends at 9 AM.  Patients are seen on a first-come first-served basis, and a limited number are seen during each clinic.  ° °[facility] ° [address], [place], [place] (336) 723-7904    Eligibility Requirements °You must have lived in [place], [place], or [place] counties for at least the last three months. °  You cannot be eligible for state or federal sponsored healthcare insurance, including Veterans Administration, Medicaid, or Medicare. °  You generally cannot be eligible for healthcare insurance through your employer.  °  How to apply: °Eligibility screenings are held every Tuesday and Wednesday afternoon from 1:00 pm until 4:00 pm. You do not need an appointment for the interview!  °[facility] [address], [place], [place] 336-631-2330   °[facility]  336-342-8273   °[facility]  336-703-3100   °[facility]  336-570-6415   ° °Behavioral Health Resources in the Community: °Intensive Outpatient Programs °Organization         Address  Phone  Notes  °[facility] [address], [place], [place] 336-878-6098   °[facility] [address], [place], [place] 336-832-9800   °[facility] [address], [place], [place] ° 336-882-2125   °[name] [address],  °[place], [place] 1-800-853-5163 or 336-641-4981   °Substance Abuse Resources °Organization         Address  Phone  Notes  °[facility]  336-882-2125   °[facility]  336-784-9470   °[facility]  336-285-9073   °[facility]  336-845-3988   °Residential & Outpatient Substance Abuse Program  1-800-659-3381   °Psychological Services °Organization         Address  Phone  Notes  °[facility]  336- 832-9600   °[facility]  336- 378-7881   °[name] [address], [place] 1-800-853-5163 or 336-641-4981   ° °Mobile Crisis Teams °Organization         Address  Phone  Notes  °[facility], Mobile Crisis Care Unit  1-877-626-1772   °Assertive °[facility] ° [address]  [place], [place] 336-834-9664   °Sharon DeEsch [address] °[place] 336-554-5454   ° °Self-Help/Support Groups °Organization         Address  Phone             Notes  °[facility] of  - variety of support groups  336- 373-1402 Call for more information  °Narcotics Anonymous (NA), [facility] [address], °[place]  2 meetings at this location  ° °  Residential Treatment Programs Organization         Address  Phone  Notes  [facility] [address],    [place]  [phone]   [facility]  [address], [place] [phone], [place], [place] [phone]   [facility] [address], [place] [phone] Admissions: 8am-3pm M-F  [facility] [address]    [place], [place] [phone]   [facility] [address], [place], [place] [phone]) [phone]   [facility] [address]  [place], [place] [phone]   [facility] Intensive Outpatient [address], [place], [place] [phone]   [facility] ([facility] [address]  [place], [place] [phone] or [phone]   [facility] [address] [place], [place] [phone] Accepts Medicaid  [facility] [address]  [place] [phone] Substance Abuse/Addiction Treatment   [facility]         Address  Phone  Notes  [facility]  [phone]([phone]) [phone]   Angie FavaJulie Brannon, PhD [address], [address] [place], [place]   ([phone](336) ([phone] or ([phone](336) [phone]   [facility]   [address] [place], [place] [phone](336) [phone]   [facility] [address], [place], [place] [phone](336) [phone] Insurance/Medicaid/sponsorship through [facility] [address]                                    [place], [place] [phone]) [phone] Therapy/tele-psych/case    [facility] [address]   [place], [place] [phone](336) [phone]    Dr. Lolly MustacheArfeen  [phone](336) [phone]   [facility] [facility]  [employer] [facility] 1) [address],  2) [address], [place] 3)  [address], [place] [phone](336) [phone](336) [phone]  [phone](336) [phone]   [facility] (731)399-5376(336) [phone] or ([phone](336) [phone] (After Hours)       Take the prescriptions as directed.  Call your regular OB/GYN doctor tomorrow to schedule a follow up appointment within the next 2 days.  Return to the Emergency Department immediately sooner if worsening.

## 2013-11-11 NOTE — ED Provider Notes (Signed)
CSN: [id number]     Arrival date & time 11/11/13  1610 History   First MD Initiated Contact with Patient 11/11/13 1656     Chief Complaint  Patient presents with  . Rash  . Abdominal Pain  . Nausea  . Emesis During Pregnancy      HPI Pt was seen at 1715.  Per pt, c/o gradual onset and persistence of constant "itching rash" to her bilat UE's and LE's that began today. Pt did not take any meds for her itching. Pt's friend at bedside states "maybe it was the new blanket" pt used before her symptoms began. Denies blisters, no ecchymosis, no petechiae, no open wounds, no hives. Pt also c/o gradual onset and persistence of constant acute flair of her chronic nausea and lower abd "pain" for the past several months. Pt states she "missed her last OB appt" and did not take any medications for her nausea. Hx G12P5, EGA [redacted] weeks. Denies any change in her usual chronic symptoms. Denies dysuria/hematuria, no LOF/vaginal bleeding/discharge, no back pain, no contractions.    Past Medical History  Diagnosis Date  . Hypertension   . Sickle cell trait   . Deliberate self-cutting   . Infection     MRSA in 1995, negative since  . Trichomonas infection   . Bipolar 1 disorder   . Schizophrenia   . Seizures     Not recently  . Anemia   . Chronic abdominal pain   . Chronic nausea    Past Surgical History  Procedure Laterality Date  . Dilation and curettage of uterus    . Plastic surgery on face    . Hemorroidectomy  2010   Family History  Problem Relation Age of Onset  . Anesthesia problems Neg Hx   . Cancer Mother   . Heart failure Mother   . Hypertension Mother   . Stroke Mother    History  Substance Use Topics  . Smoking status: Former Smoker    Quit date: 06/10/2013  . Smokeless tobacco: Never Used  . Alcohol Use: No     Comment: hx drug use   OB History   Grav Para Term Preterm Abortions TAB SAB Ect Mult Living   12 5 5  0 6 0 6 0 0 5     Review of Systems ROS: Statement: All  systems negative except as marked or noted in the HPI; Constitutional: Negative for fever and chills. ; ; Eyes: Negative for eye pain, redness and discharge. ; ; ENMT: Negative for ear pain, hoarseness, nasal congestion, sinus pressure and sore throat. ; ; Cardiovascular: Negative for chest pain, palpitations, diaphoresis, dyspnea and peripheral edema. ; ; Respiratory: Negative for cough, wheezing and stridor. ; ; Gastrointestinal: +chronic nausea. Negative for vomiting, diarrhea, abdominal pain, blood in stool, hematemesis, jaundice and rectal bleeding. . ; ; Genitourinary: Negative for dysuria, flank pain and hematuria. ; ; GYN:  No vaginal bleeding, no vaginal discharge, no vulvar pain. ;; Musculoskeletal: Negative for back pain and neck pain. Negative for swelling and trauma.; ; Skin: +itching rash. Negative for abrasions, blisters, bruising and skin lesion.; ; Neuro: Negative for headache, lightheadedness and neck stiffness. Negative for weakness, altered level of consciousness , altered mental status, extremity weakness, paresthesias, involuntary movement, seizure and syncope.      Allergies  Peanuts; Amoxicillin; and Latex  Home Medications   Prior to Admission medications   Medication Sig Start Date End Date Taking? Authorizing Provider  diphenhydrAMINE (BENADRYL) 25 MG tablet  Take 2 tablets (50 mg total) by mouth every 6 (six) hours as needed for itching. 11/11/13   Samuel JesterKathleen Afra Tricarico, DO  ondansetron (ZOFRAN) 4 MG tablet Take 1 tablet (4 mg total) by mouth every 8 (eight) hours as needed for nausea or vomiting. 11/11/13   Samuel JesterKathleen Meghanne Pletz, DO   BP 114/70  Pulse 100  Temp(Src) 98 F (36.7 C) (Oral)  Resp 20  Ht 5\' 3"  (1.6 m)  Wt 145 lb (65.772 kg)  BMI 25.69 kg/m2  SpO2 100%  LMP 03/22/2013 Physical Exam 1720: Physical examination:  Nursing notes reviewed; Vital signs and O2 SAT reviewed;  Constitutional: Well developed, Well nourished, Well hydrated, In no acute distress; Head:   Normocephalic, atraumatic; Eyes: EOMI, PERRL, No scleral icterus; ENMT: Mouth and pharynx normal, Mucous membranes moist; Neck: Supple, Full range of motion, No lymphadenopathy; Cardiovascular: Regular rate and rhythm, No murmur, rub, or gallop; Respiratory: Breath sounds clear & equal bilaterally, No rales, rhonchi, wheezes.  Speaking full sentences with ease, Normal respiratory effort/excursion; Chest: Nontender, Movement normal; Abdomen: Soft, +gravid. Nontender, Nondistended, Normal bowel sounds; Genitourinary: No CVA tenderness; Extremities: Pulses normal, No tenderness, No edema, No calf edema or asymmetry.; Neuro: AA&Ox3, Major CN grossly intact.  Speech clear. No gross focal motor or sensory deficits in extremities.; Skin: Color normal, Warm, Dry. +small maculopapular rash to bilat UE's and LE's pt is scratching at during exam. No blisters, no vesicles, no petechiae, no ecchymosis, no hives.    ED Course  Procedures     MDM  MDM Reviewed: previous chart, nursing note and vitals Reviewed previous: labs Interpretation: labs    Results for orders placed during the hospital encounter of 11/11/13  COMPREHENSIVE METABOLIC PANEL      Result Value Ref Range   Sodium 138  137 - 147 mEq/L   Potassium 3.7  3.7 - 5.3 mEq/L   Chloride 106  96 - 112 mEq/L   CO2 20  19 - 32 mEq/L   Glucose, Bld 107 (*) 70 - 99 mg/dL   BUN 6  6 - 23 mg/dL   Creatinine, Ser 1.610.53  0.50 - 1.10 mg/dL   Calcium 8.3 (*) 8.4 - 10.5 mg/dL   Total Protein 5.7 (*) 6.0 - 8.3 g/dL   Albumin 2.4 (*) 3.5 - 5.2 g/dL   AST 14  0 - 37 U/L   ALT 7  0 - 35 U/L   Alkaline Phosphatase 124 (*) 39 - 117 U/L   Total Bilirubin 0.7  0.3 - 1.2 mg/dL   GFR calc non Af Amer >90  >90 mL/min   GFR calc Af Amer >90  >90 mL/min   Anion gap 12  5 - 15  CBC WITH DIFFERENTIAL      Result Value Ref Range   WBC 10.0  4.0 - 10.5 K/uL   RBC 3.68 (*) 3.87 - 5.11 MIL/uL   Hemoglobin 11.3 (*) 12.0 - 15.0 g/dL   HCT 09.633.3 (*) 04.536.0 - 40.946.0 %    MCV 90.5  78.0 - 100.0 fL   MCH 30.7  26.0 - 34.0 pg   MCHC 33.9  30.0 - 36.0 g/dL   RDW 81.114.4  91.411.5 - 78.215.5 %   Platelets 179  150 - 400 K/uL   Neutrophils Relative % 70  43 - 77 %   Neutro Abs 7.1  1.7 - 7.7 K/uL   Lymphocytes Relative 21  12 - 46 %   Lymphs Abs 2.1  0.7 - 4.0 K/uL  Monocytes Relative 8  3 - 12 %   Monocytes Absolute 0.8  0.1 - 1.0 K/uL   Eosinophils Relative 1  0 - 5 %   Eosinophils Absolute 0.1  0.0 - 0.7 K/uL   Basophils Relative 0  0 - 1 %   Basophils Absolute 0.0  0.0 - 0.1 K/uL  LIPASE, BLOOD      Result Value Ref Range   Lipase 25  11 - 59 U/L  URINALYSIS, ROUTINE W REFLEX MICROSCOPIC      Result Value Ref Range   Color, Urine AMBER (*) YELLOW   APPearance HAZY (*) CLEAR   Specific Gravity, Urine 1.020  1.005 - 1.030   pH 7.0  5.0 - 8.0   Glucose, UA NEGATIVE  NEGATIVE mg/dL   Hgb urine dipstick NEGATIVE  NEGATIVE   Bilirubin Urine NEGATIVE  NEGATIVE   Ketones, ur NEGATIVE  NEGATIVE mg/dL   Protein, ur NEGATIVE  NEGATIVE mg/dL   Urobilinogen, UA 4.0 (*) 0.0 - 1.0 mg/dL   Nitrite NEGATIVE  NEGATIVE   Leukocytes, UA TRACE (*) NEGATIVE  URINE MICROSCOPIC-ADD ON      Result Value Ref Range   Squamous Epithelial / LPF MANY (*) RARE   WBC, UA 0-2  <3 WBC/hpf   RBC / HPF 0-2  <3 RBC/hpf   Bacteria, UA FEW (*) RARE   Urine-Other MUCOUS PRESENT      1900:  [facility] RN has evaluated pt in the ED: no contractions, FHR appropriate, lower abd pain and nausea are chronic, no need for further OB evaluation.   2000:  Labs reassuring. Udip contaminated; UC pending. Pt is not itching on my re-exam. Has tol PO well without N/V while in the ED. Abd remains benign, VSS. Family states they would like to take her home now. Dx and testing d/w pt and family.  Questions answered.  Verb understanding, agreeable to d/c home with outpt f/u.   Samuel Jester, DO 11/13/13 1428

## 2013-11-11 NOTE — ED Notes (Signed)
[facility] has been paged and is on the way.

## 2013-11-11 NOTE — Progress Notes (Signed)
Called to see pt for c/o itching, rash, nauseated, vomiting bile color fluid all day. Has not taken anything for nausea or itching. Pt G12 P5, all vag deliveries. Denies uterine contractions, vag bleeding or discharge. Baby active with FH135, spont accels no decels.

## 2013-11-18 ENCOUNTER — Telehealth: Payer: Self-pay | Admitting: *Deleted

## 2013-11-18 NOTE — Telephone Encounter (Signed)
Patient asked when her appointment was and LCSW shared with patient that she has an appointment on 12/01/13 at 8am with [facility].  [name] MSW, LCSW

## 2013-11-19 ENCOUNTER — Encounter: Payer: Self-pay | Admitting: General Practice

## 2013-11-26 ENCOUNTER — Encounter: Payer: Self-pay | Admitting: General Practice

## 2013-11-27 ENCOUNTER — Encounter (HOSPITAL_COMMUNITY): Payer: Self-pay | Admitting: *Deleted

## 2013-11-27 ENCOUNTER — Inpatient Hospital Stay (HOSPITAL_COMMUNITY): Payer: Medicaid Other | Admitting: Anesthesiology

## 2013-11-27 ENCOUNTER — Encounter (HOSPITAL_COMMUNITY): Payer: Medicaid Other | Admitting: Anesthesiology

## 2013-11-27 ENCOUNTER — Inpatient Hospital Stay (HOSPITAL_COMMUNITY)
Admission: AD | Admit: 2013-11-27 | Discharge: 2013-11-30 | DRG: 774 | Disposition: A | Discharge status: Home / Self Care | Payer: Medicaid Other | Source: Ambulatory Visit | Attending: Obstetrics & Gynecology | Admitting: Obstetrics & Gynecology

## 2013-11-27 DIAGNOSIS — O99344 Other mental disorders complicating childbirth: Secondary | ICD-10-CM | POA: Diagnosis present

## 2013-11-27 DIAGNOSIS — F1920 Other psychoactive substance dependence, uncomplicated: Secondary | ICD-10-CM | POA: Diagnosis not present

## 2013-11-27 DIAGNOSIS — O479 False labor, unspecified: Secondary | ICD-10-CM | POA: Diagnosis present

## 2013-11-27 DIAGNOSIS — O9902 Anemia complicating childbirth: Secondary | ICD-10-CM | POA: Diagnosis present

## 2013-11-27 DIAGNOSIS — Z8249 Family history of ischemic heart disease and other diseases of the circulatory system: Secondary | ICD-10-CM

## 2013-11-27 DIAGNOSIS — F319 Bipolar disorder, unspecified: Secondary | ICD-10-CM | POA: Diagnosis present

## 2013-11-27 DIAGNOSIS — F1410 Cocaine abuse, uncomplicated: Secondary | ICD-10-CM | POA: Diagnosis present

## 2013-11-27 DIAGNOSIS — O1002 Pre-existing essential hypertension complicating childbirth: Principal | ICD-10-CM | POA: Diagnosis present

## 2013-11-27 DIAGNOSIS — F1210 Cannabis abuse, uncomplicated: Secondary | ICD-10-CM | POA: Diagnosis present

## 2013-11-27 DIAGNOSIS — Z87891 Personal history of nicotine dependence: Secondary | ICD-10-CM | POA: Diagnosis not present

## 2013-11-27 DIAGNOSIS — Z823 Family history of stroke: Secondary | ICD-10-CM | POA: Diagnosis not present

## 2013-11-27 DIAGNOSIS — D573 Sickle-cell trait: Secondary | ICD-10-CM | POA: Diagnosis present

## 2013-11-27 LAB — CBC
HCT: 34.2 % — ABNORMAL LOW (ref 36.0–46.0)
Hemoglobin: 11.5 g/dL — ABNORMAL LOW (ref 12.0–15.0)
MCH: 29.3 pg (ref 26.0–34.0)
MCHC: 33.6 g/dL (ref 30.0–36.0)
MCV: 87.2 fL (ref 78.0–100.0)
Platelets: 191 10*3/uL (ref 150–400)
RBC: 3.92 MIL/uL (ref 3.87–5.11)
RDW: 14.9 % (ref 11.5–15.5)
WBC: 13.1 10*3/uL — ABNORMAL HIGH (ref 4.0–10.5)

## 2013-11-27 LAB — WET PREP, GENITAL
Clue Cells Wet Prep HPF POC: NONE SEEN
Trich, Wet Prep: NONE SEEN
Yeast Wet Prep HPF POC: NONE SEEN

## 2013-11-27 LAB — GROUP B STREP BY PCR: Group B strep by PCR: NEGATIVE

## 2013-11-27 LAB — OB RESULTS CONSOLE GBS: GBS: NEGATIVE

## 2013-11-27 MED ORDER — LIDOCAINE HCL (PF) 1 % IJ SOLN
INTRAMUSCULAR | Status: DC | PRN
  Administered 2013-11-27 (×2): 4 mL

## 2013-11-27 MED ORDER — LACTATED RINGERS IV SOLN
500.0000 mL | Freq: Once | INTRAVENOUS | Status: AC
  Administered 2013-11-27: 500 mL via INTRAVENOUS

## 2013-11-27 MED ORDER — FENTANYL 2.5 MCG/ML BUPIVACAINE 1/10 % EPIDURAL INFUSION (WH - ANES)
14.0000 mL/h | INTRAMUSCULAR | Status: DC | PRN
  Administered 2013-11-27 – 2013-11-28 (×2): 14 mL/h via EPIDURAL
  Filled 2013-11-27 (×2): qty 125

## 2013-11-27 MED ORDER — CITRIC ACID-SODIUM CITRATE 334-500 MG/5ML PO SOLN: 30.0000 mL | ORAL | Status: DC | PRN

## 2013-11-27 MED ORDER — CEFAZOLIN SODIUM-DEXTROSE 2-3 GM-% IV SOLR
2.0000 g | Freq: Once | INTRAVENOUS | Status: AC
  Administered 2013-11-27: 2 g via INTRAVENOUS
  Filled 2013-11-27: qty 50

## 2013-11-27 MED ORDER — EPHEDRINE 5 MG/ML INJ
10.0000 mg | INTRAVENOUS | Status: DC | PRN
  Filled 2013-11-27: qty 2

## 2013-11-27 MED ORDER — FENTANYL 2.5 MCG/ML BUPIVACAINE 1/10 % EPIDURAL INFUSION (WH - ANES): 14.0000 mL/h | INTRAMUSCULAR | Status: DC | PRN

## 2013-11-27 MED ORDER — LIDOCAINE HCL (PF) 1 % IJ SOLN
30.0000 mL | INTRAMUSCULAR | Status: DC | PRN
  Filled 2013-11-27 (×2): qty 30

## 2013-11-27 MED ORDER — DIPHENHYDRAMINE HCL 50 MG/ML IJ SOLN
12.5000 mg | INTRAMUSCULAR | Status: AC | PRN
  Administered 2013-11-28 (×3): 12.5 mg via INTRAVENOUS
  Filled 2013-11-27 (×2): qty 1

## 2013-11-27 MED ORDER — OXYTOCIN BOLUS FROM INFUSION
500.0000 mL | INTRAVENOUS | Status: DC
  Administered 2013-11-28: 500 mL via INTRAVENOUS

## 2013-11-27 MED ORDER — LURASIDONE HCL 40 MG PO TABS
40.0000 mg | ORAL_TABLET | Freq: Every day | ORAL | Status: DC
  Administered 2013-11-28: 40 mg via ORAL
  Filled 2013-11-27: qty 1

## 2013-11-27 MED ORDER — OXYTOCIN 40 UNITS IN LACTATED RINGERS INFUSION - SIMPLE MED
62.5000 mL/h | INTRAVENOUS | Status: DC
  Filled 2013-11-27: qty 1000

## 2013-11-27 MED ORDER — FENTANYL 2.5 MCG/ML BUPIVACAINE 1/10 % EPIDURAL INFUSION (WH - ANES)
INTRAMUSCULAR | Status: DC | PRN
  Administered 2013-11-27: 14 mL/h via EPIDURAL

## 2013-11-27 MED ORDER — PHENYLEPHRINE 40 MCG/ML (10ML) SYRINGE FOR IV PUSH (FOR BLOOD PRESSURE SUPPORT)
80.0000 ug | PREFILLED_SYRINGE | INTRAVENOUS | Status: DC | PRN
  Filled 2013-11-27: qty 2

## 2013-11-27 MED ORDER — OXYCODONE-ACETAMINOPHEN 5-325 MG PO TABS: 1.0000 | ORAL_TABLET | ORAL | Status: DC | PRN

## 2013-11-27 MED ORDER — ONDANSETRON HCL 4 MG/2ML IJ SOLN
4.0000 mg | Freq: Four times a day (QID) | INTRAMUSCULAR | Status: DC | PRN
  Administered 2013-11-28: 4 mg via INTRAVENOUS
  Filled 2013-11-27: qty 2

## 2013-11-27 MED ORDER — ACETAMINOPHEN 325 MG PO TABS: 650.0000 mg | ORAL_TABLET | ORAL | Status: DC | PRN

## 2013-11-27 MED ORDER — PHENYLEPHRINE 40 MCG/ML (10ML) SYRINGE FOR IV PUSH (FOR BLOOD PRESSURE SUPPORT)
80.0000 ug | PREFILLED_SYRINGE | INTRAVENOUS | Status: DC | PRN
  Filled 2013-11-27: qty 2
  Filled 2013-11-27: qty 10

## 2013-11-27 MED ORDER — LACTATED RINGERS IV SOLN
INTRAVENOUS | Status: DC
  Administered 2013-11-28: 02:00:00 via INTRAVENOUS

## 2013-11-27 MED ORDER — IBUPROFEN 600 MG PO TABS
600.0000 mg | ORAL_TABLET | Freq: Four times a day (QID) | ORAL | Status: DC | PRN
  Administered 2013-11-28: 600 mg via ORAL
  Filled 2013-11-27: qty 1

## 2013-11-27 MED ORDER — CEFAZOLIN SODIUM 1-5 GM-% IV SOLN
1.0000 g | Freq: Three times a day (TID) | INTRAVENOUS | Status: DC
  Filled 2013-11-27 (×2): qty 50

## 2013-11-27 MED ORDER — LACTATED RINGERS IV SOLN: 500.0000 mL | INTRAVENOUS | Status: DC | PRN

## 2013-11-27 NOTE — MAU Note (Signed)
Patient presents with complaint of contractions since 1700-1730 today.

## 2013-11-27 NOTE — H&P (Signed)
[name] is a 33 y.o. female [id number] with IUP at 3360w0d presenting for SOL. Pt states she has been having regular, every 3 minutes contractions, associated with spotting vaginal bleeding.  Membranes are intact, with active fetal movement.    [facility] w[facility] - gaps in care due to Incarceration and poor follow-up  Prenatal History/Complications:   Hx of Drug use: admits to Cocaine and Tobacco use in early pregnancy; UDS + 08/01/13  Hx of Bipolar - On Latuda  Hx of 5 SAB  Refused GTT  Past Medical History: Past Medical History  Diagnosis Date  . Hypertension   . Sickle cell trait   . Deliberate self-cutting   . Infection     MRSA in 1995, negative since  . Trichomonas infection   . Bipolar 1 disorder   . Schizophrenia   . Seizures     Not recently  . Anemia   . Chronic abdominal pain   . Chronic nausea     Past Surgical History: Past Surgical History  Procedure Laterality Date  . Dilation and curettage of uterus    . Plastic surgery on face    . Hemorroidectomy  2010    Obstetrical History: OB History   Grav Para Term Preterm Abortions TAB SAB Ect Mult Living   12 5 5  0 6 0 6 0 0 5      Social History: History   Social History  . Marital Status: Married    Spouse Name: N/A    Number of Children: N/A  . Years of Education: N/A   Social History Main Topics  . Smoking status: Former Smoker    Quit date: 06/10/2013  . Smokeless tobacco: Never Used  . Alcohol Use: No     Comment: hx drug use  . Drug Use: No     Comment: denies now  . Sexual Activity: Yes    Birth Control/ Protection: None   Other Topics Concern  . None   Social History Narrative  . None    Family History: Family History  Problem Relation Age of Onset  . Anesthesia problems Neg Hx   . Cancer Mother   . Heart failure Mother   . Hypertension Mother   . Stroke Mother     Allergies: Allergies  Allergen Reactions  . Peanuts [Peanut Oil] Shortness Of Breath,  Itching, Swelling and Rash  . Amoxicillin Hives  . Latex Hives and Itching    Facility-administered medications prior to admission  Medication Dose Route Frequency Provider Last Rate Last Dose  . Tdap (BOOSTRIX) injection 0.5 mL  0.5 mL Intramuscular Once Walidah N Karim, CNM       Prescriptions prior to admission  Medication Sig Dispense Refill  . lurasidone (LATUDA) 40 MG TABS tablet Take 40 mg by mouth daily with breakfast.      . ondansetron (ZOFRAN) 4 MG tablet Take 1 tablet (4 mg total) by mouth every 8 (eight) hours as needed for nausea or vomiting.  6 tablet  0  . Prenatal Vit-Fe Fumarate-FA (PRENATAL MULTIVITAMIN) TABS tablet Take 1 tablet by mouth daily at 12 noon.      . diphenhydrAMINE (BENADRYL) 25 MG tablet Take 2 tablets (50 mg total) by mouth every 6 (six) hours as needed for itching.  20 tablet  0    Review of Systems: Negative unless otherwise stated in History above  Physicial Blood pressure 151/97, pulse 95, temperature 98.3 F (36.8 C), temperature source Oral, resp.  rate 20, height 5\' 3"  (1.6 m), weight 65.772 kg (145 lb), last menstrual period 03/22/2013. General appearance: alert, cooperative and no distress Lungs: clear to auscultation bilaterally Heart: regular rate and rhythm Abdomen: soft, non-tender; bowel sounds normal Extremities: Homans sign is negative, no sign of DVT Presentation: cephalic Fetal monitoringBaseline: 125 bpm, Variability: Good {> 6 bpm), Accelerations: Reactive and Decelerations: Absent Uterine activityFrequency: Every 2-5 minutes Dilation: 5 Effacement (%): 70 Station: -3;Ballotable Exam by:: Gayla Doss  Prenatal labs: ABO, Rh: A/POS/-- (03/03 0943) Antibody: NEG (03/03 0943) Rubella:   RPR: NON REAC (07/08 0953)  HBsAg: NEGATIVE (03/03 0943)  HIV: NONREACTIVE (07/08 0953)  GBS:    GTT: refused  Prenatal Transfer Tool  Maternal Diabetes: No Genetic Screening: Declined Maternal Ultrasounds/Referrals: Abnormal:  Findings:    Fetal renal pyelectasis Fetal Ultrasounds or other Referrals:  None Maternal Substance Abuse:  Yes:  Type: Smoker, Marijuana, Cocaine Significant Maternal Medications:  Meds include: Other:  Latuda Significant Maternal Lab Results: Lab values include: Other:  GBS unknown  Results for orders placed during the hospital encounter of 11/27/13 (from the past 24 hour(s))  WET PREP, GENITAL   Collection Time    11/27/13  7:40 PM      Result Value Ref Range   Yeast Wet Prep HPF POC NONE SEEN  NONE SEEN   Trich, Wet Prep NONE SEEN  NONE SEEN   Clue Cells Wet Prep HPF POC NONE SEEN  NONE SEEN   WBC, Wet Prep HPF POC FEW (*) NONE SEEN    Assessment: [name] is a 33 y.o. [id number] at [redacted]w[redacted]d by here for SOL  #Labor:Admit to birthing suite #Pain: Epidural on request #FWB: Cat 1 #ID:  GBS unknown - checking, Giving Ancef #Feeding: Bottle #MOC:Depo #Circ:  Unsure - if yes, not in hospital  Wenda Low MD [facility] PGY-2 11/27/2013, 9:18 PM  Evaluation and management procedures were performed by Resident physician under my supervision/collaboration. Chart reviewed, patient examined by me and I agree with management and plan.

## 2013-11-27 NOTE — Anesthesia Preprocedure Evaluation (Signed)
Anesthesia Evaluation  Patient identified by MRN, date of birth, ID band Patient awake    Reviewed: Allergy & Precautions, H&P , NPO status , Patient's Chart, lab work & pertinent test results  Airway Mallampati: I TM Distance: >3 FB Neck ROM: full    Dental no notable dental hx.    Pulmonary neg pulmonary ROS, former smoker,    Pulmonary exam normal       Cardiovascular hypertension,     Neuro/Psych Seizures -,  PSYCHIATRIC DISORDERS Bipolar Disorder Schizophrenia    GI/Hepatic negative GI ROS, Neg liver ROS,   Endo/Other  negative endocrine ROS  Renal/GU negative Renal ROS     Musculoskeletal negative musculoskeletal ROS (+)   Abdominal Normal abdominal exam  (+)   Peds  Hematology negative hematology ROS (+) anemia ,   Anesthesia Other Findings   Reproductive/Obstetrics (+) Pregnancy                           Anesthesia Physical  Anesthesia Plan  ASA: II  Anesthesia Plan: Epidural   Post-op Pain Management:    Induction:   Airway Management Planned:   Additional Equipment:   Intra-op Plan:   Post-operative Plan:   Informed Consent: I have reviewed the patients History and Physical, chart, labs and discussed the procedure including the risks, benefits and alternatives for the proposed anesthesia with the patient or authorized representative who has indicated his/her understanding and acceptance.     Plan Discussed with:   Anesthesia Plan Comments:         Anesthesia Quick Evaluation

## 2013-11-27 NOTE — Anesthesia Procedure Notes (Signed)
Epidural Patient location during procedure: OB  Staffing Anesthesiologist: Parrish Daddario R Performed by: anesthesiologist   Preanesthetic Checklist Completed: patient identified, pre-op evaluation, timeout performed, IV checked, risks and benefits discussed and monitors and equipment checked  Epidural Patient position: sitting Prep: site prepped and draped and DuraPrep Patient monitoring: heart rate Approach: midline Location: L3-L4 Injection technique: LOR air and LOR saline  Needle:  Needle type: Tuohy  Needle gauge: 17 G Needle length: 9 cm Needle insertion depth: 6 cm Catheter type: closed end flexible Catheter size: 19 Gauge Catheter at skin depth: 11 cm Test dose: negative  Assessment Sensory level: T8 Events: blood not aspirated, injection not painful, no injection resistance, negative IV test and no paresthesia  Additional Notes Reason for block:procedure for pain   

## 2013-11-28 ENCOUNTER — Encounter (HOSPITAL_COMMUNITY): Payer: Self-pay

## 2013-11-28 DIAGNOSIS — F1920 Other psychoactive substance dependence, uncomplicated: Secondary | ICD-10-CM

## 2013-11-28 DIAGNOSIS — O99324 Drug use complicating childbirth: Secondary | ICD-10-CM

## 2013-11-28 LAB — URINALYSIS, ROUTINE W REFLEX MICROSCOPIC
BILIRUBIN URINE: NEGATIVE
Glucose, UA: NEGATIVE mg/dL
HGB URINE DIPSTICK: NEGATIVE
KETONES UR: NEGATIVE mg/dL
Leukocytes, UA: NEGATIVE
NITRITE: NEGATIVE
PH: 6.5 (ref 5.0–8.0)
Protein, ur: NEGATIVE mg/dL
Specific Gravity, Urine: 1.01 (ref 1.005–1.030)
Urobilinogen, UA: 0.2 mg/dL (ref 0.0–1.0)

## 2013-11-28 LAB — RAPID URINE DRUG SCREEN, HOSP PERFORMED
Amphetamines: POSITIVE — AB
BENZODIAZEPINES: NOT DETECTED
Barbiturates: NOT DETECTED
COCAINE: NOT DETECTED
Opiates: NOT DETECTED
Tetrahydrocannabinol: NOT DETECTED

## 2013-11-28 LAB — RPR

## 2013-11-28 MED ORDER — ONDANSETRON HCL 4 MG PO TABS: 4.0000 mg | ORAL_TABLET | ORAL | Status: DC | PRN

## 2013-11-28 MED ORDER — WITCH HAZEL-GLYCERIN EX PADS
1.0000 "application " | MEDICATED_PAD | CUTANEOUS | Status: DC | PRN
  Administered 2013-11-30: 1 via TOPICAL

## 2013-11-28 MED ORDER — PRENATAL MULTIVITAMIN CH
1.0000 | ORAL_TABLET | Freq: Every day | ORAL | Status: DC
  Administered 2013-11-29 – 2013-11-30 (×2): 1 via ORAL
  Filled 2013-11-28 (×2): qty 1

## 2013-11-28 MED ORDER — DIPHENHYDRAMINE HCL 25 MG PO CAPS: 25.0000 mg | ORAL_CAPSULE | Freq: Four times a day (QID) | ORAL | Status: DC | PRN

## 2013-11-28 MED ORDER — NALBUPHINE HCL 10 MG/ML IJ SOLN
5.0000 mg | Freq: Once | INTRAMUSCULAR | Status: AC
  Administered 2013-11-28: 5 mg via INTRAVENOUS
  Filled 2013-11-28: qty 1

## 2013-11-28 MED ORDER — OXYCODONE-ACETAMINOPHEN 5-325 MG PO TABS
1.0000 | ORAL_TABLET | ORAL | Status: DC | PRN
  Administered 2013-11-28 – 2013-11-30 (×5): 2 via ORAL
  Filled 2013-11-28: qty 2
  Filled 2013-11-28: qty 1
  Filled 2013-11-28 (×2): qty 2
  Filled 2013-11-28: qty 1
  Filled 2013-11-28: qty 2

## 2013-11-28 MED ORDER — SIMETHICONE 80 MG PO CHEW: 80.0000 mg | CHEWABLE_TABLET | ORAL | Status: DC | PRN

## 2013-11-28 MED ORDER — LANOLIN HYDROUS EX OINT: TOPICAL_OINTMENT | CUTANEOUS | Status: DC | PRN

## 2013-11-28 MED ORDER — SENNOSIDES-DOCUSATE SODIUM 8.6-50 MG PO TABS
2.0000 | ORAL_TABLET | ORAL | Status: DC
  Administered 2013-11-28: 2 via ORAL
  Filled 2013-11-28 (×2): qty 2

## 2013-11-28 MED ORDER — OXYTOCIN 40 UNITS IN LACTATED RINGERS INFUSION - SIMPLE MED
1.0000 m[IU]/min | INTRAVENOUS | Status: DC
  Administered 2013-11-28: 1 m[IU]/min via INTRAVENOUS

## 2013-11-28 MED ORDER — ZOLPIDEM TARTRATE 5 MG PO TABS: 5.0000 mg | ORAL_TABLET | Freq: Every evening | ORAL | Status: DC | PRN

## 2013-11-28 MED ORDER — DIBUCAINE 1 % RE OINT
1.0000 "application " | TOPICAL_OINTMENT | RECTAL | Status: DC | PRN
  Administered 2013-11-30: 1 via RECTAL

## 2013-11-28 MED ORDER — TERBUTALINE SULFATE 1 MG/ML IJ SOLN: 0.2500 mg | Freq: Once | INTRAMUSCULAR | Status: DC | PRN

## 2013-11-28 MED ORDER — BENZOCAINE-MENTHOL 20-0.5 % EX AERO: 1.0000 "application " | INHALATION_SPRAY | CUTANEOUS | Status: DC | PRN

## 2013-11-28 MED ORDER — ONDANSETRON HCL 4 MG/2ML IJ SOLN: 4.0000 mg | INTRAMUSCULAR | Status: DC | PRN

## 2013-11-28 MED ORDER — PNEUMOCOCCAL VAC POLYVALENT 25 MCG/0.5ML IJ INJ
0.5000 mL | INJECTION | INTRAMUSCULAR | Status: AC
  Administered 2013-11-29: 0.5 mL via INTRAMUSCULAR
  Filled 2013-11-28 (×3): qty 0.5

## 2013-11-28 MED ORDER — TETANUS-DIPHTH-ACELL PERTUSSIS 5-2.5-18.5 LF-MCG/0.5 IM SUSP: 0.5000 mL | Freq: Once | INTRAMUSCULAR | Status: DC

## 2013-11-28 MED ORDER — IBUPROFEN 600 MG PO TABS
600.0000 mg | ORAL_TABLET | Freq: Four times a day (QID) | ORAL | Status: DC
  Administered 2013-11-28 – 2013-11-30 (×8): 600 mg via ORAL
  Filled 2013-11-28 (×9): qty 1

## 2013-11-28 NOTE — Progress Notes (Signed)
[name] is a 33 y.o. [id number] at [redacted]w[redacted]d.  Subjective: Comfortable w/ epidural.  Objective: BP 101/57  Pulse 79  Temp(Src) 98.3 F (36.8 C) (Oral)  Resp 20  Ht  (1.6 m)  Wt 65.772 kg (145 lb)  BMI 25.69 kg/m2  SpO2 100%  LMP 03/22/2013   Total I/O In: -  Out: 80 [Urine:80]  FHT:  FHR: 125 bpm, variability: minimal-mod ,  accelerations:  Present,  decelerations:  Present few variables  UC:   regular, every 2-4 minutes, moderate SVE:   Dilation: 6.5 Effacement (%): 100 Station: -3 AROM large amount of clear fluid Exam by:: Ivonne Andrew, CNM  Labs: Lab Results  Component Value Date   WBC 13.1* 11/27/2013   HGB 11.5* 11/27/2013   HCT 34.2* 11/27/2013   MCV 87.2 11/27/2013   PLT 191 11/27/2013    Assessment / Plan: Protracted latent phase  Labor: progressing slowly, likely due to inadequate contractions.  Preeclampsia:  NA Fetal Wellbeing:  Category I and Category II Pain Control:  Epidural I/D:  n/a Anticipated MOD:  NSVD Consider pitocin, IUPC if no change at next check  [name] 11/28/2013, 5:02 AM

## 2013-11-28 NOTE — Progress Notes (Signed)
[name] is a 33 y.o. [id number] at [redacted]w[redacted]d.  Subjective: Comfortable w/ epidural.   Objective: BP 103/62  Pulse 79  Temp(Src) 98.3 F (36.8 C) (Oral)  Resp 20  Ht  (1.6 m)  Wt 65.772 kg (145 lb)  BMI 25.69 kg/m2  SpO2 100%  LMP 03/22/2013   Total I/O In: -  Out: 80 [Urine:80]  FHT:  FHR: 120 bpm, variability: moderate,  accelerations:  Present,  decelerations:  Absent UC:   regular, every 2-4 minutes SVE:   Dilation: 6 Effacement (%): 100 Station: Ballotable Exam by:: V., Nazareth Norenberg, cnm  Labs: Lab Results  Component Value Date   WBC 13.1* 11/27/2013   HGB 11.5* 11/27/2013   HCT 34.2* 11/27/2013   MCV 87.2 11/27/2013   PLT 191 11/27/2013    Assessment / Plan: Spontaneous labor, progressing normally  Labor: Progressing normally Preeclampsia:  NA Fetal Wellbeing:  Category I Pain Control:  Epidural I/D:  n/a Anticipated MOD:  NSVD Consider AROM when well-applied.   Azyah Flett 11/28/2013, 4:20 AM

## 2013-11-28 NOTE — Plan of Care (Signed)
Problem: Consults Goal: Haematologist (Birthing Suites) Outcome: Completed/Met Date Met:  11/28/13 Social work notified of patient status. Order placed.

## 2013-11-28 NOTE — Progress Notes (Signed)
Clinical Social Work Department PSYCHOSOCIAL ASSESSMENT - MATERNAL/CHILD 11/28/2013  Patient:  [name], [name]  Account Number:  [id number]  Admit Date:  11/27/2013  Ardine Eng Name:   Rudy Jew    Clinical Social Worker:   , LCSW   Date/Time:  11/28/2013 01:30 PM  Date Referred:  11/28/2013   Referral source  Central Nursery     Referred reason  Behavioral Health Issues  Substance Abuse   Other referral source:    I:  FAMILY / HOME ENVIRONMENT Child's legal guardian:    Guardian - Name Guardian - Age Guardian - Address  LANEYA, GASAWAY [address]  [place], [place] [address]  Marjorie Smolder  same as above   Other household support members/support persons Name Relationship DOB  [place]    Other support:    II  PSYCHOSOCIAL DATA Information Source:    Occupational hygienist Employment:   Both parents are on Programmer, applications resources:  Medicaid If [place] / Grade:   Maternity Care Coordinator / Child Services Coordination / Early Interventions:  Cultural issues impacting care:    III  STRENGTHS Strengths  Supportive family/friends   Strength comment:    IV  RISK FACTORS AND CURRENT PROBLEMS Current Problem:     Risk Factor & Current Problem Patient Issue Family Issue Risk Factor / Current Problem Comment  DSS Involvement Y N Open case with DSS    V  SOCIAL WORK ASSESSMENT Acknowledged MD order for social work consult to assess mother's hx of substance abuse and mental illness.   Met with mother.  Grandmother and spouse were present at various times.  Parents have been married for 8 years. Informed that they have 5 other dependents ages 56, 24, 49, 48, and 2.  The last five children were placed with paternal grandmother by DSS and the first child lives with her cousin.  Mother states that she and spouse are working with DSS on a re-unification plan.    She  admits to an extensive hx of substance abuse and mental illness.  Informed that she has been clean since April and has mandatory drug test and all have been clean since April.  Informed that she was initially working with [facility], but had lots of issues during the pregnancy and was unable to fully participate in the program.  Informed that she was referred to [facility] and instructed to go to her first appointment as a "walk-in after she delivered.   She reports hx of bipolar and schizophrenia.  Informed that she is not currently on medication, but plans to resume medication.     She denies current symptoms of depression, anxiety, or psychosis.  She Mother informed of the hospital's drug screen policy.  UDS on newborn was positive for amphetamines.  Informed that she took a drug test last week and it was negative.  She denies any amphetamine use.  However, she is on various and communicate that the positive drug test may be the result of one of the mediation she takes.   Mother informed of the hospital's drug screen policy.  UDS on newborn pending. Praised mother for her efforts to live a drug free lifestyle and encouraged her to continue working with DSS to regain custody of her children.   Mother was informed of CSW availability.      [place] Social Work Plan  Child Protective  Services Report- Spoke with Demetrius Charity   Type of pt/family education:   [facility] Depression  Hospital drug testing policy  Inportance of ongoing mental health and substance abuse treatment   If child protective services report - county:   If child protective services report - date:  11/28/2013 Information/referral to community resources comment:   Has been working wtih [employer]  Plan to follow up with [employer]   Other social work plan:   Will continue to follow to determine discharge disposition for newborn

## 2013-11-28 NOTE — Anesthesia Postprocedure Evaluation (Signed)
Anesthesia Post Note  Patient: [name]  Procedure(s) Performed: * No procedures listed *  Anesthesia type: Epidural  Patient location: Mother/Baby  Post pain: Pain level controlled  Post assessment: Post-op Vital signs reviewed  Last Vitals:  Filed Vitals:   11/28/13 1654  BP: 127/71  Pulse: 82  Temp: 36.7 C  Resp: 17    Post vital signs: Reviewed  Level of consciousness:alert  Complications: No apparent anesthesia complications

## 2013-11-29 LAB — CBC
HCT: 31 % — ABNORMAL LOW (ref 36.0–46.0)
HEMOGLOBIN: 10.3 g/dL — AB (ref 12.0–15.0)
MCH: 29.3 pg (ref 26.0–34.0)
MCHC: 33.2 g/dL (ref 30.0–36.0)
MCV: 88.1 fL (ref 78.0–100.0)
Platelets: 157 10*3/uL (ref 150–400)
RBC: 3.52 MIL/uL — ABNORMAL LOW (ref 3.87–5.11)
RDW: 14.9 % (ref 11.5–15.5)
WBC: 16.3 10*3/uL — ABNORMAL HIGH (ref 4.0–10.5)

## 2013-11-29 MED ORDER — LURASIDONE HCL 40 MG PO TABS
40.0000 mg | ORAL_TABLET | Freq: Every day | ORAL | Status: DC
  Administered 2013-11-29: 40 mg via ORAL
  Filled 2013-11-29 (×2): qty 1

## 2013-11-29 NOTE — Progress Notes (Signed)
CSW spoke with Altha Harm, CPS worker.  CPS has completed assessment with MOB and shared that MOB was receptive and appropriate during her assessment.  Per CPS, MOB was agreeable to exploring kinship placements, and stated that they are currently pursing kinship placements.  CPS denied need to schedule TDM at this time.   CSW to continue to monitor and to participate in disposition planning.

## 2013-11-29 NOTE — Progress Notes (Signed)
Post Partum Day 1 Subjective: no complaints, up ad lib, voiding, tolerating PO and + flatus  Objective: Blood pressure 125/77, pulse 62, temperature 97.4 F (36.3 C), temperature source Oral, resp. rate 17, height  (1.6 m), weight 65.772 kg (145 lb), last menstrual period 03/22/2013, SpO2 100.00%, unknown if currently breastfeeding.  Physical Exam:  General: alert, cooperative and no distress Lochia: appropriate Uterine Fundus: firm Incision: healing well DVT Evaluation: No evidence of DVT seen on physical exam.   Recent Labs  11/27/13 2150 11/29/13 0622  HGB 11.5* 10.3*  HCT 34.2* 31.0*    Assessment/Plan: Plan for discharge tomorrow and Social Work consult   LOS: 2 days   [facility] 11/29/2013, 8:56 AM

## 2013-11-29 NOTE — Progress Notes (Signed)
UR chart review completed.  

## 2013-11-30 MED ORDER — MEASLES, MUMPS & RUBELLA VAC ~~LOC~~ INJ
0.5000 mL | INJECTION | Freq: Once | SUBCUTANEOUS | Status: AC
  Administered 2013-11-30: 0.5 mL via SUBCUTANEOUS
  Filled 2013-11-30 (×2): qty 0.5

## 2013-11-30 MED ORDER — IBUPROFEN 600 MG PO TABS: 600.0000 mg | ORAL_TABLET | Freq: Four times a day (QID) | ORAL | Status: DC

## 2013-11-30 NOTE — Discharge Summary (Signed)
Obstetric Discharge Summary Reason for Admission: onset of labor Prenatal Procedures: NST Intrapartum Procedures: spontaneous vaginal delivery Postpartum Procedures: none Complications-Operative and Postpartum: none Hemoglobin  Date Value Ref Range Status  11/29/2013 10.3* 12.0 - 15.0 g/dL Final     HCT  Date Value Ref Range Status  11/29/2013 31.0* 36.0 - 46.0 % Final    Physical Exam:  General: alert and no distress Lochia: appropriate Uterine Fundus: firm DVT Evaluation: No evidence of DVT seen on physical exam.  Discharge Diagnoses: Term Pregnancy-delivered  Discharge Information: Date: 11/30/2013 Activity: pelvic rest Diet: routine Medications: PNV and Ibuprofen Condition: stable Instructions: refer to practice specific booklet Discharge to: home   Newborn Data: Live born female  Birth Weight: 7 lb 6.3 oz (3355 g) APGAR: 9, 9  Home with mother.  William Dalton 11/30/2013, 9:48 AM

## 2013-12-01 ENCOUNTER — Encounter: Payer: Medicaid Other | Admitting: Advanced Practice Midwife

## 2013-12-07 ENCOUNTER — Encounter: Payer: Self-pay | Admitting: Obstetrics & Gynecology

## 2014-01-13 ENCOUNTER — Telehealth: Payer: Self-pay | Admitting: General Practice

## 2014-01-13 ENCOUNTER — Encounter: Payer: Self-pay | Admitting: Obstetrics & Gynecology

## 2014-01-13 ENCOUNTER — Ambulatory Visit: Payer: Medicaid Other | Admitting: Obstetrics & Gynecology

## 2014-01-13 ENCOUNTER — Encounter: Payer: Self-pay | Admitting: General Practice

## 2014-01-13 NOTE — Telephone Encounter (Signed)
Patient no showed for pp visit. Called patient and a message stated this person is not reachable. Will send letter

## 2014-02-07 ENCOUNTER — Encounter (HOSPITAL_COMMUNITY): Payer: Self-pay

## 2014-03-23 ENCOUNTER — Other Ambulatory Visit (HOSPITAL_COMMUNITY): Payer: Medicaid Other

## 2014-04-08 NOTE — L&D Delivery Note (Cosign Needed)
Delivery Note At 9:05 PM a viable female was delivered via Vaginal, Spontaneous Delivery (Presentation: Right Occiput Anterior).  APGAR: 9, 9; weight pending. After 3 minutes, the cord was clamped and cut. 40 units of pitocin diluted in 1000cc LR was infused rapidly IV.  The placenta separated spontaneously and delivered via CCT and maternal pushing effort.  It was inspected and appears to be intact with a 3 VC.    Marland Kitchen.    Anesthesia: Epidural  Episiotomy: None Lacerations: None Suture Repair: n/a Est. Blood Loss (mL): 400  Mom to postpartum.  Baby to Couplet care / Skin to Skin.  CRESENZO-DISHMAN,Wilburt Messina 03/23/2015, 9:28 PM

## 2014-04-21 ENCOUNTER — Encounter (HOSPITAL_COMMUNITY): Payer: Self-pay | Admitting: Emergency Medicine

## 2014-04-21 DIAGNOSIS — Z87891 Personal history of nicotine dependence: Secondary | ICD-10-CM | POA: Diagnosis not present

## 2014-04-21 DIAGNOSIS — Z3202 Encounter for pregnancy test, result negative: Secondary | ICD-10-CM | POA: Diagnosis not present

## 2014-04-21 DIAGNOSIS — M79604 Pain in right leg: Secondary | ICD-10-CM | POA: Diagnosis not present

## 2014-04-21 DIAGNOSIS — M79605 Pain in left leg: Secondary | ICD-10-CM | POA: Diagnosis not present

## 2014-04-21 DIAGNOSIS — Z880 Allergy status to penicillin: Secondary | ICD-10-CM | POA: Diagnosis not present

## 2014-04-21 DIAGNOSIS — Z862 Personal history of diseases of the blood and blood-forming organs and certain disorders involving the immune mechanism: Secondary | ICD-10-CM | POA: Diagnosis not present

## 2014-04-21 DIAGNOSIS — N760 Acute vaginitis: Secondary | ICD-10-CM | POA: Insufficient documentation

## 2014-04-21 DIAGNOSIS — I10 Essential (primary) hypertension: Secondary | ICD-10-CM | POA: Diagnosis not present

## 2014-04-21 DIAGNOSIS — F319 Bipolar disorder, unspecified: Secondary | ICD-10-CM | POA: Diagnosis not present

## 2014-04-21 DIAGNOSIS — N61 Inflammatory disorders of breast: Secondary | ICD-10-CM | POA: Insufficient documentation

## 2014-04-21 DIAGNOSIS — F209 Schizophrenia, unspecified: Secondary | ICD-10-CM | POA: Diagnosis not present

## 2014-04-21 DIAGNOSIS — R300 Dysuria: Secondary | ICD-10-CM | POA: Diagnosis not present

## 2014-04-21 DIAGNOSIS — Z91040 Latex allergy status: Secondary | ICD-10-CM | POA: Diagnosis not present

## 2014-04-21 DIAGNOSIS — Z8614 Personal history of Methicillin resistant Staphylococcus aureus infection: Secondary | ICD-10-CM | POA: Diagnosis not present

## 2014-04-21 DIAGNOSIS — Z8619 Personal history of other infectious and parasitic diseases: Secondary | ICD-10-CM | POA: Diagnosis not present

## 2014-04-21 DIAGNOSIS — G8929 Other chronic pain: Secondary | ICD-10-CM | POA: Insufficient documentation

## 2014-04-21 DIAGNOSIS — R1030 Lower abdominal pain, unspecified: Secondary | ICD-10-CM | POA: Diagnosis present

## 2014-04-21 DIAGNOSIS — Z79899 Other long term (current) drug therapy: Secondary | ICD-10-CM | POA: Insufficient documentation

## 2014-04-21 NOTE — ED Notes (Signed)
The patient said she has been having pain in her lower abdomen for two days.  She denies vaginal discharge and bleeding but says her urine smells "funny'.  She also says it hurt to urinate.  The patient is also complaining of left  breast pain.  She says that two days ago it was leaking blood and pus out of it.  The patient said it stopped leaking but it is now painful.  She rates her pain 10/10 in her abdomen and her left breast.

## 2014-04-22 ENCOUNTER — Emergency Department (HOSPITAL_COMMUNITY)
Admission: EM | Admit: 2014-04-22 | Discharge: 2014-04-22 | Disposition: A | Discharge status: Home / Self Care | Payer: Medicaid Other | Attending: Emergency Medicine | Admitting: Emergency Medicine

## 2014-04-22 DIAGNOSIS — N760 Acute vaginitis: Secondary | ICD-10-CM

## 2014-04-22 DIAGNOSIS — N61 Mastitis without abscess: Secondary | ICD-10-CM

## 2014-04-22 DIAGNOSIS — B9689 Other specified bacterial agents as the cause of diseases classified elsewhere: Secondary | ICD-10-CM

## 2014-04-22 LAB — COMPREHENSIVE METABOLIC PANEL
ALT: 11 U/L (ref 0–35)
ANION GAP: 8 (ref 5–15)
AST: 16 U/L (ref 0–37)
Albumin: 4.2 g/dL (ref 3.5–5.2)
Alkaline Phosphatase: 64 U/L (ref 39–117)
BUN: 11 mg/dL (ref 6–23)
CALCIUM: 9.6 mg/dL (ref 8.4–10.5)
CHLORIDE: 103 meq/L (ref 96–112)
CO2: 27 mmol/L (ref 19–32)
Creatinine, Ser: 0.8 mg/dL (ref 0.50–1.10)
GFR calc Af Amer: >90 mL/min (ref >90)
GFR calc non Af Amer: >90 mL/min (ref >90)
Glucose, Bld: 86 mg/dL (ref 70–99)
POTASSIUM: 3.4 mmol/L — AB (ref 3.5–5.1)
SODIUM: 138 mmol/L (ref 135–145)
Total Bilirubin: 1 mg/dL (ref 0.3–1.2)
Total Protein: 7.2 g/dL (ref 6.0–8.3)

## 2014-04-22 LAB — CBC WITH DIFFERENTIAL/PLATELET
Basophils Absolute: 0 10*3/uL (ref 0.0–0.1)
Basophils Relative: 0 % (ref 0–1)
Eosinophils Absolute: 0.1 10*3/uL (ref 0.0–0.7)
Eosinophils Relative: 1 % (ref 0–5)
HCT: 35.7 % — ABNORMAL LOW (ref 36.0–46.0)
Hemoglobin: 12.4 g/dL (ref 12.0–15.0)
LYMPHS ABS: 2.7 10*3/uL (ref 0.7–4.0)
LYMPHS PCT: 33 % (ref 12–46)
MCH: 30 pg (ref 26.0–34.0)
MCHC: 34.7 g/dL (ref 30.0–36.0)
MCV: 86.2 fL (ref 78.0–100.0)
Monocytes Absolute: 0.7 10*3/uL (ref 0.1–1.0)
Monocytes Relative: 8 % (ref 3–12)
NEUTROS PCT: 58 % (ref 43–77)
Neutro Abs: 4.7 10*3/uL (ref 1.7–7.7)
Platelets: 238 10*3/uL (ref 150–400)
RBC: 4.14 MIL/uL (ref 3.87–5.11)
RDW: 14.5 % (ref 11.5–15.5)
WBC: 8.2 10*3/uL (ref 4.0–10.5)

## 2014-04-22 LAB — URINALYSIS, ROUTINE W REFLEX MICROSCOPIC
Bilirubin Urine: NEGATIVE
GLUCOSE, UA: NEGATIVE mg/dL
Hgb urine dipstick: NEGATIVE
Ketones, ur: NEGATIVE mg/dL
Nitrite: NEGATIVE
Protein, ur: NEGATIVE mg/dL
SPECIFIC GRAVITY, URINE: 1.026 (ref 1.005–1.030)
Urobilinogen, UA: 1 mg/dL (ref 0.0–1.0)
pH: 6.5 (ref 5.0–8.0)

## 2014-04-22 LAB — GC/CHLAMYDIA PROBE AMP (~~LOC~~) NOT AT ARMC
CHLAMYDIA, DNA PROBE: NEGATIVE
NEISSERIA GONORRHEA: NEGATIVE

## 2014-04-22 LAB — POC URINE PREG, ED: PREG TEST UR: NEGATIVE

## 2014-04-22 LAB — URINE MICROSCOPIC-ADD ON

## 2014-04-22 LAB — WET PREP, GENITAL
Trich, Wet Prep: NONE SEEN
Yeast Wet Prep HPF POC: NONE SEEN

## 2014-04-22 LAB — I-STAT TROPONIN, ED: TROPONIN I, POC: 0 ng/mL (ref 0.00–0.08)

## 2014-04-22 LAB — LIPASE, BLOOD: Lipase: 31 U/L (ref 11–59)

## 2014-04-22 LAB — CK: Total CK: 57 U/L (ref 7–177)

## 2014-04-22 MED ORDER — METRONIDAZOLE 500 MG PO TABS
500.0000 mg | ORAL_TABLET | Freq: Once | ORAL | Status: AC
  Administered 2014-04-22: 500 mg via ORAL
  Filled 2014-04-22: qty 1

## 2014-04-22 MED ORDER — IBUPROFEN 400 MG PO TABS
800.0000 mg | ORAL_TABLET | Freq: Once | ORAL | Status: DC
  Filled 2014-04-22: qty 1

## 2014-04-22 MED ORDER — CLINDAMYCIN HCL 300 MG PO CAPS
450.0000 mg | ORAL_CAPSULE | Freq: Once | ORAL | Status: AC
  Administered 2014-04-22: 450 mg via ORAL
  Filled 2014-04-22: qty 1

## 2014-04-22 MED ORDER — METRONIDAZOLE 500 MG PO TABS: 500.0000 mg | ORAL_TABLET | Freq: Two times a day (BID) | ORAL | Status: DC

## 2014-04-22 MED ORDER — CLINDAMYCIN HCL 150 MG PO CAPS: 450.0000 mg | ORAL_CAPSULE | Freq: Three times a day (TID) | ORAL | Status: DC

## 2014-04-22 NOTE — Discharge Instructions (Signed)
Bacterial Vaginosis Ms. Nusz, you were seen today for breast pain. Take antibiotic as prescribed and follow-up with her primary care physician within 3 days. Also your abdominal pain shows that she may have bacterial vaginosis. Take antibiotics as prescribed. If symptoms worsen come back to emergency department immediately. Thank you. Bacterial vaginosis is an infection of the vagina. It happens when too many of certain germs (bacteria) grow in the vagina. HOME CARE  Take your medicine as told by your doctor.  Finish your medicine even if you start to feel better.  Do not have sex until you finish your medicine and are better.  Tell your sex partner that you have an infection. They should see their doctor for treatment.  Practice safe sex. Use condoms. Have only one sex partner. GET HELP IF:  You are not getting better after 3 days of treatment.  You have more grey fluid (discharge) coming from your vagina than before.  You have more pain than before.  You have a fever. MAKE SURE YOU:   Understand these instructions.  Will watch your condition.  Will get help right away if you are not doing well or get worse. Document Released: 01/02/2008 Document Revised: 01/13/2013 Document Reviewed: 11/04/2012 [facility] Patient Information 2015 [place], [place]. This information is not intended to replace advice given to you by your health care provider. Make sure you discuss any questions you have with your health care provider. Mastitis  Mastitis is redness, soreness, and puffiness (inflammation) in an area of the breast. It is often caused by an infection that occurs when bacteria enter the skin. The infection is often helped by antibiotic medicine. HOME CARE  Only take medicines as told by your doctor.  If your doctor prescribed an antibiotic medicine, take it as told. Finish it even if you start to feel better.  Do not wear a tight or underwire bra. Wear a soft support bra.  Drink  more fluids, especially if you have a fever.  If you are breastfeeding:  Keep emptying the breast. Your doctor can tell you if the milk is safe. Use a breast pump if you are told to stop nursing.  Keep your nipples clean and dry.  Empty the first breast before going to the other breast. Use a breast pump if your baby is not emptying your breast.  If you go back to work, pump your breasts while at work.  Avoid letting your breasts get overly filled with milk (engorged). GET HELP IF:  You have pus-like fluid leaking from your breast.  Your symptoms do not get better within 2 days. GET HELP RIGHT AWAY IF:  Your pain and puffiness are getting worse.  Your pain is not helped by medicine.  You have a red line going from your breast toward your armpit.  You have a fever or lasting symptoms for more than 2-3 days.  You have a fever and your symptoms suddenly get worse. MAKE SURE YOU:   Understand these instructions.  Will watch your condition.  Will get help right away if you are not doing well or get worse. Document Released: 03/13/2009 Document Revised: 11/25/2012 Document Reviewed: 10/23/2012 [facility] Patient Information 2015 [place]ExitCare, [place]. This information is not intended to replace advice given to you by your health care provider. Make sure you discuss any questions you have with your health care provider.

## 2014-04-22 NOTE — ED Provider Notes (Signed)
CSN: [id number]     Arrival date & time 04/21/14  2254 History  This chart was scribed for Tomasita Crumble, MD by Murriel Hopper, ED Scribe. This patient was seen in room B15C/B15C and the patient's care was started at 2:39 AM.    Chief Complaint  Patient presents with  . Abdominal Pain    The patient said she has been having pain in her lower abdomen for two days.  She denies vaginal discharge and bleeding but says her urine smells "funny'.  . Breast Pain      The history is provided by the patient. No language interpreter was used.    HPI Comments: [name] is a 34 y.o. female who presents to the Emergency Department complaining of intermittent left breast pain that has been present for two days with associated purulent drainage and bleeding. Pt states she has had similar symptoms before. Pt also complains of lower abdominal pain that she describes as "sharp and cramping" with associated dysuria that has been present for 2-3 days. Pt denies hematuria, vaginal discharge, and vaginal bleeding. Pt states she has been having unprotected sex with her husband recently. Pt also complains of generalized bilateral leg pain, and states that her right leg pain is worse than her left. Pt denies diarrhea, fever, or vomiting.   Past Medical History  Diagnosis Date  . Hypertension   . Sickle cell trait   . Deliberate self-cutting   . Infection     MRSA in 1995, negative since  . Trichomonas infection   . Bipolar 1 disorder   . Schizophrenia   . Seizures     Not recently  . Anemia   . Chronic abdominal pain   . Chronic nausea    Past Surgical History  Procedure Laterality Date  . Dilation and curettage of uterus    . Plastic surgery on face    . Hemorroidectomy  2010   Family History  Problem Relation Age of Onset  . Anesthesia problems Neg Hx   . Cancer Mother   . Heart failure Mother   . Hypertension Mother   . Stroke Mother    History  Substance Use Topics  . Smoking status:  Former Smoker    Quit date: 06/10/2013  . Smokeless tobacco: Never Used  . Alcohol Use: No     Comment: hx drug use   OB History    Gravida Para Term Preterm AB TAB SAB Ectopic Multiple Living   0 6 0 6 0 0 6     Review of Systems  Constitutional: Negative for fever.  Gastrointestinal: Negative for vomiting and diarrhea.  Genitourinary: Positive for dysuria. Negative for hematuria, vaginal bleeding and vaginal discharge.  Musculoskeletal: Positive for myalgias.      Allergies  Peanuts; Amoxicillin; and Latex  Home Medications   Prior to Admission medications   Medication Sig Start Date End Date Taking? Authorizing Provider  diphenhydrAMINE (BENADRYL) 25 MG tablet Take 2 tablets (50 mg total) by mouth every 6 (six) hours as needed for itching. 11/11/13   Samuel Jester, DO  ibuprofen (ADVIL,MOTRIN) 600 MG tablet Take 1 tablet (600 mg total) by mouth every 6 (six) hours. 11/30/13   William Dalton, MD  lurasidone (LATUDA) 40 MG TABS tablet Take 40 mg by mouth daily with breakfast.    Historical Provider, MD  ondansetron (ZOFRAN) 4 MG tablet Take 1 tablet (4 mg total) by mouth every 8 (eight) hours as needed for nausea or  vomiting. 11/11/13   Samuel JesterKathleen McManus, DO  Prenatal Vit-Fe Fumarate-FA (PRENATAL MULTIVITAMIN) TABS tablet Take 1 tablet by mouth daily at 12 noon.    Historical Provider, MD   BP 109/59 mmHg  Pulse 82  Temp(Src) 98.5 F (36.9 C)  Resp 15  SpO2 100%  LMP  (LMP Unknown) Physical Exam  Constitutional: She is oriented to person, place, and time. She appears well-developed and well-nourished. No distress.  HENT:  Head: Normocephalic and atraumatic.  Eyes: Conjunctivae and EOM are normal. Pupils are equal, round, and reactive to light. No scleral icterus.  Neck: Normal range of motion. Neck supple. No JVD present. No tracheal deviation present. No thyromegaly present.  Cardiovascular: Normal rate, regular rhythm and normal heart sounds.  Exam reveals no  gallop and no friction rub.   No murmur heard. Pulmonary/Chest: Effort normal and breath sounds normal. No respiratory distress. She has no wheezes. She exhibits no tenderness.  Abdominal: Soft. Bowel sounds are normal. She exhibits no distension and no mass. There is no tenderness. There is no rebound and no guarding.  Genitourinary: Vagina normal and uterus normal. No vaginal discharge found.  No CMT or adnexal tenderness  Musculoskeletal: Normal range of motion. She exhibits no edema or tenderness.  Lymphadenopathy:    She has no cervical adenopathy.  Neurological: She is alert and oriented to person, place, and time.  Skin: Skin is warm and dry. No rash noted. No erythema. No pallor.  Breast exam reveals no significant abnormalities. There is no warmth or swelling or drainage of purulence of the left breast or nipple.  Nursing note and vitals reviewed.   ED Course  Procedures (including critical care time)  DIAGNOSTIC STUDIES: Oxygen Saturation is 100% on RA, normal by my interpretation.    COORDINATION OF CARE: 2:43 AM Discussed treatment plan with pt at bedside and pt agreed to plan.   Labs Review Labs Reviewed  WET PREP, GENITAL - Abnormal; Notable for the following:    Clue Cells Wet Prep HPF POC FEW (*)    WBC, Wet Prep HPF POC FEW (*)    All other components within normal limits  URINALYSIS, ROUTINE W REFLEX MICROSCOPIC - Abnormal; Notable for the following:    Leukocytes, UA SMALL (*)    All other components within normal limits  COMPREHENSIVE METABOLIC PANEL - Abnormal; Notable for the following:    Potassium 3.4 (*)    All other components within normal limits  CBC WITH DIFFERENTIAL - Abnormal; Notable for the following:    HCT 35.7 (*)    All other components within normal limits  LIPASE, BLOOD  URINE MICROSCOPIC-ADD ON  CK  POC URINE PREG, ED  I-STAT TROPOININ, ED  GC/CHLAMYDIA PROBE AMP ([place])    Imaging Review No results found.   EKG  Interpretation None      MDM   Final diagnoses:  None    Patient since emergency department for abdominal pain and breast pain. Breast exam does not reveal any significant infection. Is no warmth or drainage seen. Patient states she's had drainage of pus so we'll treat as a mastitis with clindamycin. Pelvic exam does not reveal any acute dissection transmitted infections. Wet prep shows few clue cells and the patient is symptomatic stating she's had BV in the past. We'll treat with Flagyl. Exam does not reveal any significant swelling or tenderness. CK level was sent. If normal, the patient will be safe for discharge with stable vital signs. Primary care follow-up was  advised.  I personally performed the services described in this documentation, which was scribed in my presence. The recorded information has been reviewed and is accurate.    Tomasita Crumble, MD 04/22/14 [phone]

## 2014-04-22 NOTE — ED Notes (Signed)
Pelvic exam performed by Dr. Mora Bellmanni and Kenney Housemananya T.-EMT and Lane HackerHarley R.-EMT assisted

## 2014-08-16 ENCOUNTER — Emergency Department (HOSPITAL_COMMUNITY): Payer: Medicaid Other

## 2014-08-16 ENCOUNTER — Encounter (HOSPITAL_COMMUNITY): Payer: Self-pay

## 2014-08-16 ENCOUNTER — Emergency Department (HOSPITAL_COMMUNITY)
Admission: EM | Admit: 2014-08-16 | Discharge: 2014-08-17 | Disposition: A | Discharge status: Home / Self Care | Payer: Medicaid Other | Attending: Emergency Medicine | Admitting: Emergency Medicine

## 2014-08-16 DIAGNOSIS — Y92410 Unspecified street and highway as the place of occurrence of the external cause: Secondary | ICD-10-CM | POA: Insufficient documentation

## 2014-08-16 DIAGNOSIS — S43401A Unspecified sprain of right shoulder joint, initial encounter: Secondary | ICD-10-CM | POA: Diagnosis not present

## 2014-08-16 DIAGNOSIS — Z3201 Encounter for pregnancy test, result positive: Secondary | ICD-10-CM

## 2014-08-16 DIAGNOSIS — H539 Unspecified visual disturbance: Secondary | ICD-10-CM

## 2014-08-16 DIAGNOSIS — Z8619 Personal history of other infectious and parasitic diseases: Secondary | ICD-10-CM | POA: Insufficient documentation

## 2014-08-16 DIAGNOSIS — F319 Bipolar disorder, unspecified: Secondary | ICD-10-CM | POA: Insufficient documentation

## 2014-08-16 DIAGNOSIS — S0011XA Contusion of right eyelid and periocular area, initial encounter: Secondary | ICD-10-CM | POA: Diagnosis not present

## 2014-08-16 DIAGNOSIS — Z862 Personal history of diseases of the blood and blood-forming organs and certain disorders involving the immune mechanism: Secondary | ICD-10-CM | POA: Diagnosis not present

## 2014-08-16 DIAGNOSIS — Z792 Long term (current) use of antibiotics: Secondary | ICD-10-CM | POA: Insufficient documentation

## 2014-08-16 DIAGNOSIS — Z8614 Personal history of Methicillin resistant Staphylococcus aureus infection: Secondary | ICD-10-CM | POA: Diagnosis not present

## 2014-08-16 DIAGNOSIS — S0990XA Unspecified injury of head, initial encounter: Secondary | ICD-10-CM

## 2014-08-16 DIAGNOSIS — H05231 Hemorrhage of right orbit: Secondary | ICD-10-CM

## 2014-08-16 DIAGNOSIS — I10 Essential (primary) hypertension: Secondary | ICD-10-CM | POA: Diagnosis not present

## 2014-08-16 DIAGNOSIS — Z91040 Latex allergy status: Secondary | ICD-10-CM | POA: Insufficient documentation

## 2014-08-16 DIAGNOSIS — Y9389 Activity, other specified: Secondary | ICD-10-CM | POA: Insufficient documentation

## 2014-08-16 DIAGNOSIS — S3992XA Unspecified injury of lower back, initial encounter: Secondary | ICD-10-CM | POA: Insufficient documentation

## 2014-08-16 DIAGNOSIS — Z87891 Personal history of nicotine dependence: Secondary | ICD-10-CM | POA: Diagnosis not present

## 2014-08-16 DIAGNOSIS — S24109A Unspecified injury at unspecified level of thoracic spinal cord, initial encounter: Secondary | ICD-10-CM | POA: Diagnosis not present

## 2014-08-16 DIAGNOSIS — F209 Schizophrenia, unspecified: Secondary | ICD-10-CM | POA: Diagnosis not present

## 2014-08-16 DIAGNOSIS — G8929 Other chronic pain: Secondary | ICD-10-CM | POA: Diagnosis not present

## 2014-08-16 DIAGNOSIS — S59901A Unspecified injury of right elbow, initial encounter: Secondary | ICD-10-CM | POA: Diagnosis not present

## 2014-08-16 DIAGNOSIS — Y998 Other external cause status: Secondary | ICD-10-CM | POA: Diagnosis not present

## 2014-08-16 DIAGNOSIS — S161XXA Strain of muscle, fascia and tendon at neck level, initial encounter: Secondary | ICD-10-CM | POA: Diagnosis not present

## 2014-08-16 DIAGNOSIS — Z880 Allergy status to penicillin: Secondary | ICD-10-CM | POA: Insufficient documentation

## 2014-08-16 DIAGNOSIS — Z79899 Other long term (current) drug therapy: Secondary | ICD-10-CM | POA: Insufficient documentation

## 2014-08-16 DIAGNOSIS — Z331 Pregnant state, incidental: Secondary | ICD-10-CM | POA: Diagnosis not present

## 2014-08-16 LAB — POC URINE PREG, ED: Preg Test, Ur: POSITIVE — AB

## 2014-08-16 MED ORDER — OXYCODONE-ACETAMINOPHEN 5-325 MG PO TABS
1.0000 | ORAL_TABLET | Freq: Once | ORAL | Status: AC
  Administered 2014-08-16: 1 via ORAL
  Filled 2014-08-16: qty 1

## 2014-08-16 MED ORDER — TETRACAINE HCL 0.5 % OP SOLN
2.0000 [drp] | Freq: Once | OPHTHALMIC | Status: AC
  Administered 2014-08-16: 2 [drp] via OPHTHALMIC
  Filled 2014-08-16: qty 2
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
## 2014-08-16 NOTE — ED Notes (Signed)
Asked for urine and pt. Unable to void at the moment.

## 2014-08-16 NOTE — ED Notes (Signed)
Pt transported to radiology.
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
## 2014-08-16 NOTE — ED Notes (Signed)
ICE pack given. 

## 2014-08-17 ENCOUNTER — Encounter: Payer: Self-pay | Admitting: Ophthalmology

## 2014-08-17 MED ORDER — HYDROCODONE-ACETAMINOPHEN 5-325 MG PO TABS: 1.0000 | ORAL_TABLET | Freq: Four times a day (QID) | ORAL | Status: DC | PRN
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
## 2014-08-21 ENCOUNTER — Emergency Department (HOSPITAL_COMMUNITY)
Admission: EM | Admit: 2014-08-21 | Discharge: 2014-08-21 | Disposition: A | Discharge status: Home / Self Care | Payer: Medicaid Other | Attending: Emergency Medicine | Admitting: Emergency Medicine

## 2014-08-21 ENCOUNTER — Emergency Department (HOSPITAL_COMMUNITY): Payer: Medicaid Other

## 2014-08-21 ENCOUNTER — Encounter (HOSPITAL_COMMUNITY): Payer: Self-pay | Admitting: Emergency Medicine

## 2014-08-21 DIAGNOSIS — Z8614 Personal history of Methicillin resistant Staphylococcus aureus infection: Secondary | ICD-10-CM | POA: Insufficient documentation

## 2014-08-21 DIAGNOSIS — Z87828 Personal history of other (healed) physical injury and trauma: Secondary | ICD-10-CM | POA: Insufficient documentation

## 2014-08-21 DIAGNOSIS — O10011 Pre-existing essential hypertension complicating pregnancy, first trimester: Secondary | ICD-10-CM | POA: Diagnosis not present

## 2014-08-21 DIAGNOSIS — R51 Headache: Secondary | ICD-10-CM | POA: Insufficient documentation

## 2014-08-21 DIAGNOSIS — B9689 Other specified bacterial agents as the cause of diseases classified elsewhere: Secondary | ICD-10-CM

## 2014-08-21 DIAGNOSIS — Z880 Allergy status to penicillin: Secondary | ICD-10-CM | POA: Insufficient documentation

## 2014-08-21 DIAGNOSIS — O99351 Diseases of the nervous system complicating pregnancy, first trimester: Secondary | ICD-10-CM | POA: Insufficient documentation

## 2014-08-21 DIAGNOSIS — H53149 Visual discomfort, unspecified: Secondary | ICD-10-CM | POA: Diagnosis not present

## 2014-08-21 DIAGNOSIS — O23591 Infection of other part of genital tract in pregnancy, first trimester: Secondary | ICD-10-CM | POA: Insufficient documentation

## 2014-08-21 DIAGNOSIS — O4690 Antepartum hemorrhage, unspecified, unspecified trimester: Secondary | ICD-10-CM

## 2014-08-21 DIAGNOSIS — Z79899 Other long term (current) drug therapy: Secondary | ICD-10-CM | POA: Diagnosis not present

## 2014-08-21 DIAGNOSIS — Z862 Personal history of diseases of the blood and blood-forming organs and certain disorders involving the immune mechanism: Secondary | ICD-10-CM | POA: Diagnosis not present

## 2014-08-21 DIAGNOSIS — Z3A01 Less than 8 weeks gestation of pregnancy: Secondary | ICD-10-CM | POA: Diagnosis not present

## 2014-08-21 DIAGNOSIS — F319 Bipolar disorder, unspecified: Secondary | ICD-10-CM | POA: Diagnosis not present

## 2014-08-21 DIAGNOSIS — Z87891 Personal history of nicotine dependence: Secondary | ICD-10-CM | POA: Diagnosis not present

## 2014-08-21 DIAGNOSIS — O9989 Other specified diseases and conditions complicating pregnancy, childbirth and the puerperium: Secondary | ICD-10-CM | POA: Diagnosis present

## 2014-08-21 DIAGNOSIS — Z8619 Personal history of other infectious and parasitic diseases: Secondary | ICD-10-CM | POA: Insufficient documentation

## 2014-08-21 DIAGNOSIS — Z3491 Encounter for supervision of normal pregnancy, unspecified, first trimester: Secondary | ICD-10-CM

## 2014-08-21 DIAGNOSIS — O99341 Other mental disorders complicating pregnancy, first trimester: Secondary | ICD-10-CM | POA: Insufficient documentation

## 2014-08-21 DIAGNOSIS — Z91040 Latex allergy status: Secondary | ICD-10-CM | POA: Insufficient documentation

## 2014-08-21 DIAGNOSIS — Z792 Long term (current) use of antibiotics: Secondary | ICD-10-CM | POA: Insufficient documentation

## 2014-08-21 DIAGNOSIS — F209 Schizophrenia, unspecified: Secondary | ICD-10-CM | POA: Diagnosis not present

## 2014-08-21 DIAGNOSIS — N760 Acute vaginitis: Secondary | ICD-10-CM

## 2014-08-21 DIAGNOSIS — G8929 Other chronic pain: Secondary | ICD-10-CM | POA: Insufficient documentation

## 2014-08-21 LAB — WET PREP, GENITAL
Trich, Wet Prep: NONE SEEN
YEAST WET PREP: NONE SEEN

## 2014-08-21 LAB — URINALYSIS, ROUTINE W REFLEX MICROSCOPIC
Bilirubin Urine: NEGATIVE
Glucose, UA: NEGATIVE mg/dL
Hgb urine dipstick: NEGATIVE
Ketones, ur: NEGATIVE mg/dL
Leukocytes, UA: NEGATIVE
Nitrite: NEGATIVE
Protein, ur: NEGATIVE mg/dL
Specific Gravity, Urine: 1.014 (ref 1.005–1.030)
Urobilinogen, UA: 0.2 mg/dL (ref 0.0–1.0)
pH: 7 (ref 5.0–8.0)

## 2014-08-21 LAB — HIV ANTIBODY (ROUTINE TESTING W REFLEX): HIV Screen 4th Generation wRfx: NONREACTIVE

## 2014-08-21 LAB — SYPHILIS: RPR W/REFLEX TO RPR TITER AND TREPONEMAL ANTIBODIES, TRADITIONAL SCREENING AND DIAGNOSIS ALGORITHM: RPR Ser Ql: NONREACTIVE

## 2014-08-21 LAB — HCG, QUANTITATIVE, PREGNANCY: HCG, BETA CHAIN, QUANT, S: 74868 m[IU]/mL — AB (ref <5)

## 2014-08-21 MED ORDER — METOCLOPRAMIDE HCL 5 MG/ML IJ SOLN
10.0000 mg | Freq: Once | INTRAMUSCULAR | Status: AC
  Administered 2014-08-21: 10 mg via INTRAMUSCULAR
  Filled 2014-08-21: qty 2

## 2014-08-21 MED ORDER — DIPHENHYDRAMINE HCL 50 MG/ML IJ SOLN
25.0000 mg | Freq: Once | INTRAMUSCULAR | Status: AC
  Administered 2014-08-21: 25 mg via INTRAMUSCULAR
  Filled 2014-08-21: qty 1

## 2014-08-21 MED ORDER — METOCLOPRAMIDE HCL 5 MG/ML IJ SOLN: 10.0000 mg | Freq: Once | INTRAMUSCULAR | Status: DC

## 2014-08-21 MED ORDER — PRENATAL VITAMINS 0.8 MG PO TABS: 1.0000 | ORAL_TABLET | Freq: Every day | ORAL | Status: DC

## 2014-08-21 NOTE — ED Provider Notes (Signed)
CSN: [id number]     Arrival date & time 08/21/14  0457 History   First MD Initiated Contact with Patient 08/21/14 0503     Chief Complaint  Patient presents with  . Vaginal Discharge  . Headache  . Eye Pain     (Consider location/radiation/quality/duration/timing/severity/associated sxs/prior Treatment) HPI 34 year old female presents to emergency department with complaint of right-sided headache, right eye pain, and pink vaginal discharge.  [facility]  During that ED visit, she had a positive pregnancy test.  She is unsure how long she has been pregnant.  Patient was seen by Dr. Alvino Chapelhoi with ophthalmology due to blurred vision in the right eye.  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  Patient was to follow-up with him in a week.  She does not know when her appointment is scheduled.  She has not yet seen OB.  She reports that she woke to go the bathroom just prior to arrival and developed severe right-sided headache.  She reports the pain is worse with light.  She also reports when she went to the bathroom,  she had a large amount of pink discharge from her vagina.  Patient reports it is been over a week since she last had sexual intercourse  Past Medical History  Diagnosis Date  . Hypertension   . Sickle cell trait   . Deliberate self-cutting   . Infection     MRSA in 1995, negative since  . Trichomonas infection   . Bipolar 1 disorder   . Schizophrenia   . Seizures     Not recently  . Anemia   . Chronic abdominal pain   . Chronic nausea    Past Surgical History  Procedure Laterality Date  . Dilation and curettage of uterus    . Plastic surgery on face    . Hemorroidectomy  2010   Family History  Problem Relation Age of Onset  . Anesthesia problems Neg Hx   . Cancer Mother   . Heart failure Mother   . Hypertension Mother   . Stroke Mother    History  Substance Use Topics  . Smoking  status: Former Smoker    Quit date: 06/10/2013  . Smokeless tobacco: Never Used  . Alcohol Use: No     Comment: hx drug use   OB History    Gravida Para Term Preterm AB TAB SAB Ectopic Multiple Living   13 6 6  0 6 0 6 0 0 6     Review of Systems  See History of Present Illness; otherwise all other systems are reviewed and negative   Allergies  Peanuts; Amoxicillin; and Latex  Home Medications   Prior to Admission medications   Medication Sig Start Date End Date Taking? Authorizing Provider  clindamycin (CLEOCIN) 150 MG capsule Take 3 capsules (450 mg total) by mouth 3 (three) times daily. Patient not taking: Reported on 08/16/2014 04/22/14   Tomasita CrumbleAdeleke Oni, MD  diphenhydrAMINE (BENADRYL) 25 MG tablet Take 2 tablets (50 mg total) by mouth every 6 (six) hours as needed for itching. Patient not taking: Reported on 08/16/2014 11/11/13   Samuel JesterKathleen McManus, DO  HYDROcodone-acetaminophen (NORCO) 5-325 MG per tablet Take 1 tablet by mouth every 6 (six) hours as needed for moderate pain. 08/17/14   Tatyana Kirichenko, PA-C  ibuprofen (ADVIL,MOTRIN) 600 MG tablet Take 1 tablet (600 mg total) by mouth every 6 (six) hours. 11/30/13  William Dalton, MD  lurasidone (LATUDA) 40 MG TABS tablet Take 40 mg by mouth daily with breakfast.    Historical Provider, MD  metroNIDAZOLE (FLAGYL) 500 MG tablet Take 1 tablet (500 mg total) by mouth 2 (two) times daily. Patient not taking: Reported on 08/16/2014 04/22/14   Tomasita Crumble, MD  ondansetron (ZOFRAN) 4 MG tablet Take 1 tablet (4 mg total) by mouth every 8 (eight) hours as needed for nausea or vomiting. Patient not taking: Reported on 08/16/2014 11/11/13   Samuel Jester, DO  Prenatal Vit-Fe Fumarate-FA (PRENATAL MULTIVITAMIN) TABS tablet Take 1 tablet by mouth daily at 12 noon.    Historical Provider, MD   BP 132/80 mmHg  Pulse 71  Temp(Src) 97.8 F (36.6 C) (Oral)  Resp 16  SpO2 99%  LMP  (LMP Unknown) Physical Exam  Constitutional: She is oriented to  person, place, and time. She appears well-developed and well-nourished.  HENT:  Head: Normocephalic and atraumatic.  Nose: Nose normal.  Mouth/Throat: Oropharynx is clear and moist.  Eyes: Conjunctivae and EOM are normal. Pupils are equal, round, and reactive to light. Right eye exhibits no discharge. Left eye exhibits no discharge.  Patient has photophobia with light shown in either eye.  Worse in right eye.  Right pupil is 6 mm and does not react.  There is slight irregularity to the iris.  Pt does not tolerate prolonged exam due to pain.  Unable to assess anterior chamber for cell/flare.    Neck: Normal range of motion. Neck supple. No JVD present. No tracheal deviation present. No thyromegaly present.  Cardiovascular: Normal rate, regular rhythm, normal heart sounds and intact distal pulses.  Exam reveals no gallop and no friction rub.   No murmur heard. Pulmonary/Chest: Effort normal and breath sounds normal. No stridor. No respiratory distress. She has no wheezes. She has no rales. She exhibits no tenderness.  Abdominal: Soft. Bowel sounds are normal. She exhibits no distension and no mass. There is no tenderness. There is no rebound and no guarding.  Genitourinary:  External genitalia normal Vagina with thick copious amounts of yellow white discharge Cervix  normal negative for cervical motion tenderness Adnexa palpated, no masses or negative for tenderness noted Bladder palpated negative for tenderness Uterus palpated no masses, but enlarged, retroflexed and  positive for tenderness    Musculoskeletal: Normal range of motion. She exhibits no edema or tenderness.  Lymphadenopathy:    She has no cervical adenopathy.  Neurological: She is alert and oriented to person, place, and time. She displays normal reflexes. She exhibits normal muscle tone. Coordination normal.  Skin: Skin is warm and dry. No rash noted. No erythema. No pallor.  Psychiatric: She has a normal mood and affect. Her  behavior is normal. Judgment and thought content normal.  Nursing note and vitals reviewed.   ED Course  Procedures (including critical care time) Labs Review Labs Reviewed  WET PREP, GENITAL - Abnormal; Notable for the following:    Clue Cells Wet Prep HPF POC MODERATE (*)    WBC, Wet Prep HPF POC FEW (*)    All other components within normal limits  URINALYSIS, ROUTINE W REFLEX MICROSCOPIC - Abnormal; Notable for the following:    APPearance CLOUDY (*)    All other components within normal limits  HIV ANTIBODY (ROUTINE TESTING)  RPR  HCG, QUANTITATIVE, PREGNANCY  GC/CHLAMYDIA PROBE AMP ([place])    Imaging Review No results found.   EKG Interpretation None      MDM  Final diagnoses:  None    34 year old female with concern for bleeding during early pregnancy.  Patient also with right headache and eye pain with recent trauma, suspect traumatic iritis.  Plan for transvaginal ultrasound for dating of the pregnancy and assessment for intrauterine versus ectopic.  We'll touch base with Dr. Alvino Chapelhoi, who saw the patient recently to establish follow-up for her eye pain.   Hailey Severinlga Donathan Buller, MD 08/21/14 [phone]

## 2014-08-21 NOTE — ED Provider Notes (Signed)
VA 20/200 R, 20/20L, EOM nml, + photophobia. R pupil dilated and non reactive, anterior chamber is cloudy without layering (pt laying supine). In reviewing prior optho note, it seem sonly difference is in presence of cells and photophobia. Suspect iritis/uveitis. I spoke w/ Dr. Dione BoozeGroat who would like to see her in office this morning. We are arranging transportation. TVUS shows 830w3d pregnancy w/o complication.  1. Bacterial vaginosis   2. Vaginal bleeding in pregnancy   3. First trimester pregnancy      Toy CookeyMegan Docherty, MD 08/21/14 [phone]
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
## 2014-08-21 NOTE — Discharge Instructions (Signed)
First Trimester of Pregnancy °The first trimester of pregnancy is from week 1 until the end of week 12 (months 1 through 3). A week after a sperm fertilizes an egg, the egg will implant on the wall of the uterus. This embryo will begin to develop into a baby. Genes from you and your partner are forming the baby. The female genes determine whether the baby is a boy or a girl. At 6-8 weeks, the eyes and face are formed, and the heartbeat can be seen on ultrasound. At the end of 12 weeks, all the baby's organs are formed.  °Now that you are pregnant, you will want to do everything you can to have a healthy baby. Two of the most important things are to get good prenatal care and to follow your health care provider's instructions. Prenatal care is all the medical care you receive before the baby's birth. This care will help prevent, find, and treat any problems during the pregnancy and childbirth. °BODY CHANGES °Your body goes through many changes during pregnancy. The changes vary from woman to woman.  °· You may gain or lose a couple of pounds at first. °· You may feel sick to your stomach (nauseous) and throw up (vomit). If the vomiting is uncontrollable, call your health care provider. °· You may tire easily. °· You may develop headaches that can be relieved by medicines approved by your health care provider. °· You may urinate more often. Painful urination may mean you have a bladder infection. °· You may develop heartburn as a result of your pregnancy. °· You may develop constipation because certain hormones are causing the muscles that push waste through your intestines to slow down. °· You may develop hemorrhoids or swollen, bulging veins (varicose veins). °· Your breasts may begin to grow larger and become tender. Your nipples may stick out more, and the tissue that surrounds them (areola) may become darker. °· Your gums may bleed and may be sensitive to brushing and flossing. °· Dark spots or blotches (chloasma,  mask of pregnancy) may develop on your face. This will likely fade after the baby is born. °· Your menstrual periods will stop. °· You may have a loss of appetite. °· You may develop cravings for certain kinds of food. °· You may have changes in your emotions from day to day, such as being excited to be pregnant or being concerned that something may go wrong with the pregnancy and baby. °· You may have more vivid and strange dreams. °· You may have changes in your hair. These can include thickening of your hair, rapid growth, and changes in texture. Some women also have hair loss during or after pregnancy, or hair that feels dry or thin. Your hair will most likely return to normal after your baby is born. °WHAT TO EXPECT AT YOUR PRENATAL VISITS °During a routine prenatal visit: °· You will be weighed to make sure you and the baby are growing normally. °· Your blood pressure will be taken. °· Your abdomen will be measured to track your baby's growth. °· The fetal heartbeat will be listened to starting around week 10 or 12 of your pregnancy. °· Test results from any previous visits will be discussed. °Your health care provider may ask you: °· How you are feeling. °· If you are feeling the baby move. °· If you have had any abnormal symptoms, such as leaking fluid, bleeding, severe headaches, or abdominal cramping. °· If you have any questions. °Other tests   that may be performed during your first trimester include: °· Blood tests to find your blood type and to check for the presence of any previous infections. They will also be used to check for low iron levels (anemia) and Rh antibodies. Later in the pregnancy, blood tests for diabetes will be done along with other tests if problems develop. °· Urine tests to check for infections, diabetes, or protein in the urine. °· An ultrasound to confirm the proper growth and development of the baby. °· An amniocentesis to check for possible genetic problems. °· Fetal screens for  spina bifida and Down syndrome. °· You may need other tests to make sure you and the baby are doing well. °HOME CARE INSTRUCTIONS  °Medicines °· Follow your health care provider's instructions regarding medicine use. Specific medicines may be either safe or unsafe to take during pregnancy. °· Take your prenatal vitamins as directed. °· If you develop constipation, try taking a stool softener if your health care provider approves. °Diet °· Eat regular, well-balanced meals. Choose a variety of foods, such as meat or vegetable-based protein, fish, milk and low-fat dairy products, vegetables, fruits, and whole grain breads and cereals. Your health care provider will help you determine the amount of weight gain that is right for you. °· Avoid raw meat and uncooked cheese. These carry germs that can cause birth defects in the baby. °· Eating four or five small meals rather than three large meals a day may help relieve nausea and vomiting. If you start to feel nauseous, eating a few soda crackers can be helpful. Drinking liquids between meals instead of during meals also seems to help nausea and vomiting. °· If you develop constipation, eat more high-fiber foods, such as fresh vegetables or fruit and whole grains. Drink enough fluids to keep your urine clear or pale yellow. °Activity and Exercise °· Exercise only as directed by your health care provider. Exercising will help you: °¨ Control your weight. °¨ Stay in shape. °¨ Be prepared for labor and delivery. °· Experiencing pain or cramping in the lower abdomen or low back is a good sign that you should stop exercising. Check with your health care provider before continuing normal exercises. °· Try to avoid standing for long periods of time. Move your legs often if you must stand in one place for a long time. °· Avoid heavy lifting. °· Wear low-heeled shoes, and practice good posture. °· You may continue to have sex unless your health care provider directs you  otherwise. °Relief of Pain or Discomfort °· Wear a good support bra for breast tenderness.   °· Take warm sitz baths to soothe any pain or discomfort caused by hemorrhoids. Use hemorrhoid cream if your health care provider approves.   °· Rest with your legs elevated if you have leg cramps or low back pain. °· If you develop varicose veins in your legs, wear support hose. Elevate your feet for 15 minutes, 3-4 times a day. Limit salt in your diet. °Prenatal Care °· Schedule your prenatal visits by the twelfth week of pregnancy. They are usually scheduled monthly at first, then more often in the last 2 months before delivery. °· Write down your questions. Take them to your prenatal visits. °· Keep all your prenatal visits as directed by your health care provider. °Safety °· Wear your seat belt at all times when driving. °· Make a list of emergency phone numbers, including numbers for family, friends, the hospital, and police and fire departments. °General Tips °·   Ask your health care provider for a referral to a local prenatal education class. Begin classes no later than at the beginning of month 6 of your pregnancy.  Ask for help if you have counseling or nutritional needs during pregnancy. Your health care provider can offer advice or refer you to specialists for help with various needs.  Do not use hot tubs, steam rooms, or saunas.  Do not douche or use tampons or scented sanitary pads.  Do not cross your legs for long periods of time.  Avoid cat litter boxes and soil used by cats. These carry germs that can cause birth defects in the baby and possibly loss of the fetus by miscarriage or stillbirth.  Avoid all smoking, herbs, alcohol, and medicines not prescribed by your health care provider. Chemicals in these affect the formation and growth of the baby.  Schedule a dentist appointment. At home, brush your teeth with a soft toothbrush and be gentle when you floss. SEEK MEDICAL CARE IF:   You have  dizziness.  You have mild pelvic cramps, pelvic pressure, or nagging pain in the abdominal area.  You have persistent nausea, vomiting, or diarrhea.  You have a bad smelling vaginal discharge.  You have pain with urination.  You notice increased swelling in your face, hands, legs, or ankles. SEEK IMMEDIATE MEDICAL CARE IF:   You have a fever.  You are leaking fluid from your vagina.  You have spotting or bleeding from your vagina.  You have severe abdominal cramping or pain.  You have rapid weight gain or loss.  You vomit blood or material that looks like coffee grounds.  You are exposed to [place] measles and have never had them.  You are exposed to fifth disease or chickenpox.  You develop a severe headache.  You have shortness of breath.  You have any kind of trauma, such as from a fall or a car accident. Document Released: 03/19/2001 Document Revised: 08/09/2013 Document Reviewed: 02/02/2013 [facility] Patient Information 2015 [place], [place]. This information is not intended to replace advice given to you by your health care provider. Make sure you discuss any questions you have with your health care provider.   Iritis Iritis is an inflammation of the colored part of the eye (iris). Other parts at the front of the eye may also be inflamed. The iris is part of the middle layer of the eyeball which is called the uvea or the uveal track. Any part of the uveal track can become inflamed. The other portions of the uveal track are the choroid (the thin membrane under the outer layer of the eye), and the ciliary body (joins the choroid and the iris and produces the fluid in the front of the eye).  It is extremely important to treat iritis early, as it may lead to internal eye damage causing scarring or diseases such as glaucoma. Some people have only one attack of iritis (in one or both eyes) in their lifetime, while others may get it many times. CAUSES Iritis can be associated  with many different diseases, but mostly occurs in otherwise healthy people. Examples of diseases that can be associated with iritis include:  Diseases where the body's immune system attacks tissues within your own body (autoimmune diseases).  Infections (tuberculosis, gonorrhea, fungus infections, Lyme disease, infection of the lining of the heart).  Trauma or injury.  Eye diseases (acute glaucoma and others).  Inflammation from other parts of the uveal track.  Severe eye infections.  Other  rare diseases. SYMPTOMS  Eye pain or aching.  Sensitivity to light.  Loss of sight or blurred vision.  Redness of the eye. This is often accompanied by a ring of redness around the outside of the cornea, or clear covering at the front of the eye (ciliary flush).  Excessive tearing of the eye(s).  A small pupil that does not enlarge in the dark and stays smaller than the other eye's pupil.  A whitish area that obscures the lower part of the colored circular iris. Sometimes this is visible when looking at the eye, where the whitish area has a "fluid level" or flat top. This is called a "hypopyon" and is actually pus inside the eye. Since iritis causes the eye to become red, it is often confused with a much less dangerous form of "pink eye" or conjunctivitis. One of the most important symptoms is sensitivity to light. Anytime there is redness, discomfort in the eye(s) and extreme light sensitivity, it is extremely important to see an ophthalmologist as soon as possible. TREATMENT Acute iritis requires prompt medical evaluation by an eye specialist (ophthalmologist.) Treatment depends on the underlying cause but may include:  Corticosteroid eye drops and dilating eye drops. Follow your caregiver's exact instructions on taking and stopping corticosteroid medications (drops or pills).  Occasionally, the iritis will be so severe that it will not respond to commonly used medications. If this happens,  it may be necessary to use steroid injections. The injections are given under the eye's outer surface. Sometimes oral medications are given. The decision on treatment used for iritis is usually made on an individual basis. HOME CARE INSTRUCTIONS Your care giver will give specific instructions regarding the use of eye medications or other medications. Be certain to follow all instructions in both taking and stopping the medications. SEEK IMMEDIATE MEDICAL CARE IF:  You have redness of one or both eye.  You experience a great deal of light sensitivity.  You have pain or aching in either eye. MAKE SURE YOU:   Understand these instructions.  Will watch your condition.  Will get help right away if you are not doing well or get worse. Document Released: 03/25/2005 Document Revised: 06/17/2011 Document Reviewed: 09/12/2006 [facility] Patient Information 2015 [place], [place]. This information is not intended to replace advice given to you by your health care provider. Make sure you discuss any questions you have with your health care provider.

## 2014-08-21 NOTE — ED Notes (Signed)
L eye 20/20 R eye pt cannot see at all. Tim, RN aware. MD aware
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
## 2014-08-22 LAB — GC/CHLAMYDIA PROBE AMP (~~LOC~~) NOT AT ARMC
CHLAMYDIA, DNA PROBE: NEGATIVE
Neisseria Gonorrhea: NEGATIVE

## 2014-09-15 ENCOUNTER — Encounter (HOSPITAL_COMMUNITY): Payer: Self-pay | Admitting: *Deleted

## 2014-09-15 ENCOUNTER — Inpatient Hospital Stay (HOSPITAL_COMMUNITY)
Admission: AD | Admit: 2014-09-15 | Discharge: 2014-09-15 | Disposition: A | Discharge status: Home / Self Care | Payer: Medicaid Other | Source: Ambulatory Visit | Attending: Obstetrics & Gynecology | Admitting: Obstetrics & Gynecology

## 2014-09-15 DIAGNOSIS — R109 Unspecified abdominal pain: Secondary | ICD-10-CM | POA: Diagnosis present

## 2014-09-15 DIAGNOSIS — Z3A10 10 weeks gestation of pregnancy: Secondary | ICD-10-CM | POA: Diagnosis not present

## 2014-09-15 DIAGNOSIS — Z79899 Other long term (current) drug therapy: Secondary | ICD-10-CM | POA: Insufficient documentation

## 2014-09-15 DIAGNOSIS — B9689 Other specified bacterial agents as the cause of diseases classified elsewhere: Secondary | ICD-10-CM | POA: Diagnosis not present

## 2014-09-15 DIAGNOSIS — O99341 Other mental disorders complicating pregnancy, first trimester: Secondary | ICD-10-CM | POA: Insufficient documentation

## 2014-09-15 DIAGNOSIS — D573 Sickle-cell trait: Secondary | ICD-10-CM | POA: Diagnosis not present

## 2014-09-15 DIAGNOSIS — N760 Acute vaginitis: Secondary | ICD-10-CM | POA: Diagnosis not present

## 2014-09-15 DIAGNOSIS — O26899 Other specified pregnancy related conditions, unspecified trimester: Secondary | ICD-10-CM

## 2014-09-15 DIAGNOSIS — A499 Bacterial infection, unspecified: Secondary | ICD-10-CM | POA: Diagnosis not present

## 2014-09-15 DIAGNOSIS — O9989 Other specified diseases and conditions complicating pregnancy, childbirth and the puerperium: Secondary | ICD-10-CM | POA: Diagnosis not present

## 2014-09-15 DIAGNOSIS — O23591 Infection of other part of genital tract in pregnancy, first trimester: Secondary | ICD-10-CM | POA: Diagnosis not present

## 2014-09-15 DIAGNOSIS — F209 Schizophrenia, unspecified: Secondary | ICD-10-CM | POA: Insufficient documentation

## 2014-09-15 DIAGNOSIS — Z87891 Personal history of nicotine dependence: Secondary | ICD-10-CM | POA: Diagnosis not present

## 2014-09-15 LAB — WET PREP, GENITAL
Trich, Wet Prep: NONE SEEN
Yeast Wet Prep HPF POC: NONE SEEN

## 2014-09-15 LAB — URINALYSIS, ROUTINE W REFLEX MICROSCOPIC
BILIRUBIN URINE: NEGATIVE
Glucose, UA: NEGATIVE mg/dL
Hgb urine dipstick: NEGATIVE
Ketones, ur: NEGATIVE mg/dL
LEUKOCYTES UA: NEGATIVE
Nitrite: NEGATIVE
PH: 7.5 (ref 5.0–8.0)
PROTEIN: NEGATIVE mg/dL
Specific Gravity, Urine: 1.01 (ref 1.005–1.030)
Urobilinogen, UA: 1 mg/dL (ref 0.0–1.0)

## 2014-09-15 MED ORDER — ACETAMINOPHEN 500 MG PO TABS
1000.0000 mg | ORAL_TABLET | Freq: Once | ORAL | Status: AC
  Administered 2014-09-15: 1000 mg via ORAL
  Filled 2014-09-15: qty 2

## 2014-09-15 MED ORDER — PROMETHAZINE HCL 25 MG PO TABS: 25.0000 mg | ORAL_TABLET | Freq: Four times a day (QID) | ORAL | Status: DC | PRN

## 2014-09-15 MED ORDER — PROMETHAZINE HCL 25 MG PO TABS
25.0000 mg | ORAL_TABLET | Freq: Once | ORAL | Status: AC
Start: 2014-09-15 — End: 2014-09-15
  Administered 2014-09-15: 25 mg via ORAL
  Filled 2014-09-15: qty 1

## 2014-09-15 MED ORDER — TRAMADOL HCL 50 MG PO TABS
100.0000 mg | ORAL_TABLET | Freq: Once | ORAL | Status: AC
Start: 2014-09-15 — End: 2014-09-15
  Administered 2014-09-15: 100 mg via ORAL
  Filled 2014-09-15: qty 2

## 2014-09-15 MED ORDER — METRONIDAZOLE 500 MG PO TABS: 500.0000 mg | ORAL_TABLET | Freq: Two times a day (BID) | ORAL | Status: DC

## 2014-09-15 NOTE — MAU Provider Note (Signed)
Chief Complaint: Abdominal Pain and Vaginal Discharge  First Provider Initiated Contact with Patient 09/15/14 1158     SUBJECTIVE HPI: [name] is a 34 y.o. [id number] at [redacted]w[redacted]d by LMP who presents to [facility] reporting abd cramping and yellow, malodorous discharge 1 week. Patient states she is in a mutually monogamous relationship.   Live IUP verified by ultrasound 08/21/2014.  Patient asked RN CNM if it was safe to continue her weekly Abilify injections.  Past Medical History  Diagnosis Date  . Hypertension   . Sickle cell trait   . Deliberate self-cutting   . Infection     MRSA in 1995, negative since  . Trichomonas infection   . Bipolar 1 disorder   . Schizophrenia   . Seizures     Not recently  . Anemia   . Chronic abdominal pain   . Chronic nausea    OB History  Gravida Para Term Preterm AB SAB TAB Ectopic Multiple Living  0 6 6 0 0 0 6    # Outcome Date GA Lbr Len/2nd Weight Sex Delivery Anes PTL Lv  13 Current           12 Term 11/28/13 [redacted]w[redacted]d 17:02 / 00:14  M Vag-Spont EPI  Y  11 Term 04/28/11 [redacted]w[redacted]d 07:06 / 00:12 7 lb (3.175 kg) M Vag-Spont EPI  Y     Comments: wnl  10 Term 2011 [redacted]w[redacted]d  7 lb 9 oz (3.43 kg) F Vag-Spont   Y  9 Term 2003     Vag-Spont     8 SAB           7 SAB           6 SAB           5 SAB           4 SAB           3 SAB           2 Term      Vag-Spont     1 Term      Vag-Spont        Past Surgical History  Procedure Laterality Date  . Dilation and curettage of uterus    . Plastic surgery on face    . Hemorroidectomy  2010   History   Social History  . Marital Status: Married    Spouse Name: N/A  . Number of Children: N/A  . Years of Education: N/A   Occupational History  . Not on file.   Social History Main Topics  . Smoking status: Former Smoker    Quit date: 06/10/2013  . Smokeless tobacco: Never Used  . Alcohol Use: No     Comment: hx drug use  . Drug Use: No     Comment: denies now  . Sexual  Activity: Yes    Birth Control/ Protection: None   Other Topics Concern  . Not on file   Social History Narrative   No current facility-administered medications on file prior to encounter.   Current Outpatient Prescriptions on File Prior to Encounter  Medication Sig Dispense Refill  . diphenhydrAMINE (BENADRYL) 25 MG tablet Take 2 tablets (50 mg total) by mouth every 6 (six) hours as needed for itching. (Patient not taking: Reported on 08/16/2014) 20 tablet 0  . HYDROcodone-acetaminophen (NORCO) 5-325 MG per tablet Take 1 tablet by mouth every 6 (six) hours as needed for moderate pain. (Patient not taking:  Reported on 09/15/2014) 20 tablet 0  . ibuprofen (ADVIL,MOTRIN) 600 MG tablet Take 1 tablet (600 mg total) by mouth every 6 (six) hours. (Patient not taking: Reported on 08/21/2014) 30 tablet 0  . metroNIDAZOLE (FLAGYL) 500 MG tablet Take 1 tablet (500 mg total) by mouth 2 (two) times daily. (Patient not taking: Reported on 08/16/2014) 14 tablet 0  . ondansetron (ZOFRAN) 4 MG tablet Take 1 tablet (4 mg total) by mouth every 8 (eight) hours as needed for nausea or vomiting. (Patient not taking: Reported on 08/16/2014) 6 tablet 0  . Prenatal Multivit-Min-Fe-FA (PRENATAL VITAMINS) 0.8 MG tablet Take 1 tablet by mouth daily. 30 tablet 10   Allergies  Allergen Reactions  . Peanuts [Peanut Oil] Shortness Of Breath, Itching, Swelling and Rash  . Amoxicillin Hives  . Latex Hives and Itching    Review of Systems  Constitutional: Negative for fever and chills.  Gastrointestinal: Positive for abdominal pain. Negative for nausea, vomiting, diarrhea and constipation.  Genitourinary: Negative for dysuria, urgency, frequency, hematuria and flank pain.        Positive for vaginal discharge and vaginal odor. Negative for vaginal bleeding.  Neurological: Positive for speech change.    OBJECTIVE Blood pressure 132/67, pulse 75, temperature 98.5 F (36.9 C), temperature source Oral, resp. rate 18, height  5\' 3"  (1.6 m), weight 133 lb (60.328 kg), last menstrual period 06/21/2014, unknown if currently breastfeeding. GENERAL: Well-developed, well-nourished female in no acute distress.  HEART: normal rate RESP: normal effort GI: Abdomen soft, non-tender. Positive bowel sounds 4. MS: Nontender, no edema NEURO: Alert and oriented SPECULUM EXAM: NEFG,  Moderate amount of creamy, white, malodorous discharge, no blood noted, cervix clean BIMANUAL: cervix  closed; uterus  10-week size, no adnexal tenderness or masses. No cervical motion tenderness.  No CVA tenderness.  LAB RESULTS Results for orders placed or performed during the hospital encounter of 09/15/14 (from the past 24 hour(s))  Urinalysis, Routine w reflex microscopic (not at [facility])     Status: Abnormal   Collection Time: 09/15/14 10:30 AM  Result Value Ref Range   Color, Urine YELLOW YELLOW   APPearance CLOUDY (A) CLEAR   Specific Gravity, Urine 1.010 1.005 - 1.030   pH 7.5 5.0 - 8.0   Glucose, UA NEGATIVE NEGATIVE mg/dL   Hgb urine dipstick NEGATIVE NEGATIVE   Bilirubin Urine NEGATIVE NEGATIVE   Ketones, ur NEGATIVE NEGATIVE mg/dL   Protein, ur NEGATIVE NEGATIVE mg/dL   Urobilinogen, UA 1.0 0.0 - 1.0 mg/dL   Nitrite NEGATIVE NEGATIVE   Leukocytes, UA NEGATIVE NEGATIVE  Wet prep, genital     Status: Abnormal   Collection Time: 09/15/14 11:15 AM  Result Value Ref Range   Yeast Wet Prep HPF POC NONE SEEN NONE SEEN   Trich, Wet Prep NONE SEEN NONE SEEN   Clue Cells Wet Prep HPF POC MODERATE (A) NONE SEEN   WBC, Wet Prep HPF POC FEW (A) NONE SEEN    IMAGING US Ob Comp Less 14 Wks  08/21/2014   CLINICAL DATA:  Vaginal discharge and bleeding. Unknown last menstrual period.  EXAM: OBSTETRIC <14 WK [place] AND TRANSVAGINAL OB US  TECHNIQUE: Both transabdominal and transvaginal ultrasound examinations were performed for complete evaluation of the gestation as well as the maternal uterus, adnexal regions, and pelvic cul-de-sac. Transvaginal  technique was performed to assess early pregnancy.  COMPARISON:  None.  FINDINGS: Intrauterine gestational sac: Visualized/normal in shape.  Yolk sac:  Present  Embryo:  Present  Cardiac Activity: Present  Heart Rate: 121  bpm  CRL:  0.62  cm   6 w   3 d                  [place] EDC: April 13, 2015  Maternal uterus/adnexae: The bilateral ovaries are normal. There is no free fluid.  IMPRESSION: Single live intrauterine gestation measuring to 6 weeks and 3 days without complications.   Electronically Signed   By: Sherian Rein M.D.   On: 08/21/2014 07:34   US Ob Transvaginal  08/21/2014   CLINICAL DATA:  Vaginal discharge and bleeding. Unknown last menstrual period.  EXAM: OBSTETRIC <14 WK [place] AND TRANSVAGINAL OB US  TECHNIQUE: Both transabdominal and transvaginal ultrasound examinations were performed for complete evaluation of the gestation as well as the maternal uterus, adnexal regions, and pelvic cul-de-sac. Transvaginal technique was performed to assess early pregnancy.  COMPARISON:  None.  FINDINGS: Intrauterine gestational sac: Visualized/normal in shape.  Yolk sac:  Present  Embryo:  Present  Cardiac Activity: Present  Heart Rate: 121  bpm  CRL:  0.62  cm   6 w   3 d                  [place] EDC: April 13, 2015  Maternal uterus/adnexae: The bilateral ovaries are normal. There is no free fluid.  IMPRESSION: Single live intrauterine gestation measuring to 6 weeks and 3 days without complications.   Electronically Signed   By: Sherian Rein M.D.   On: 08/21/2014 07:34   MAU COURSE Wet prep, GC/chlamydia cultures.  Positive cardiac activity and fetal movement per informal bedside ultrasound.  Discussed patient's history of schizophrenia and medications. She feels that she is stable on Abilify and that it works better than any other meds that she's been around from the past.  Per up-to-date Abilify is category C, but there is limited study with use in pregnancy. Among atypical anti-psychotic Seroquel has been  studied more. Asked patient if she had used Seroquel before. She stated she had , but it didn't work as well as Abilify. Will leave patient on Abilify and have her discussed her new OB visit.  ASSESSMENT 1. BV (bacterial vaginosis)   2. Abdominal pain affecting pregnancy, antepartum   3. Schizophrenia, unspecified type    PLAN Discharge home in stable condition.     Follow-up Information    Follow up with [facility].   Specialty:  Obstetrics and Gynecology   Why:  Start prenatal care   Contact information:   [address] [place] [address] [phone]      Follow up with [facility] [place] MATERN[facility] ADMISS[facility].   Why:  As needed in emergencies   Contact information:   [address] [id number] mc [place] [address] [phone]       Medication List    STOP taking these medications        diphenhydrAMINE 25 MG tablet  Commonly known as:  BENADRYL     HYDROcodone-acetaminophen 5-325 MG per tablet  Commonly known as:  NORCO     ibuprofen 600 MG tablet  Commonly known as:  ADVIL,MOTRIN     ondansetron 4 MG tablet  Commonly known as:  ZOFRAN     Prenatal Vitamins 0.8 MG tablet      TAKE these medications        metroNIDAZOLE 500 MG tablet  Commonly known as:  FLAGYL  Take 1 tablet (500 mg total) by  mouth 2 (two) times daily.     promethazine 25 MG tablet  Commonly known as:  PHENERGAN  Take 1 tablet (25 mg total) by mouth every 6 (six) hours as needed for nausea or vomiting.      Continue Abilify.  [place], CNM 09/15/2014  2:11 PM

## 2014-09-15 NOTE — Discharge Instructions (Signed)
Bacterial Vaginosis Bacterial vaginosis is a vaginal infection that occurs when the normal balance of bacteria in the vagina is disrupted. It results from an overgrowth of certain bacteria. This is the most common vaginal infection in women of childbearing age. Treatment is important to prevent complications, especially in pregnant women, as it can cause a premature delivery. CAUSES  Bacterial vaginosis is caused by an increase in harmful bacteria that are normally present in smaller amounts in the vagina. Several different kinds of bacteria can cause bacterial vaginosis. However, the reason that the condition develops is not fully understood. RISK FACTORS Certain activities or behaviors can put you at an increased risk of developing bacterial vaginosis, including:  Having a new sex partner or multiple sex partners.  Douching.  Using an intrauterine device (IUD) for contraception. Women do not get bacterial vaginosis from toilet seats, bedding, swimming pools, or contact with objects around them. SIGNS AND SYMPTOMS  Some women with bacterial vaginosis have no signs or symptoms. Common symptoms include:  Grey vaginal discharge.  A fishlike odor with discharge, especially after sexual intercourse.  Itching or burning of the vagina and vulva.  Burning or pain with urination. DIAGNOSIS  Your health care provider will take a medical history and examine the vagina for signs of bacterial vaginosis. A sample of vaginal fluid may be taken. Your health care provider will look at this sample under a microscope to check for bacteria and abnormal cells. A vaginal pH test may also be done.  TREATMENT  Bacterial vaginosis may be treated with antibiotic medicines. These may be given in the form of a pill or a vaginal cream. A second round of antibiotics may be prescribed if the condition comes back after treatment.  HOME CARE INSTRUCTIONS   Only take over-the-counter or prescription medicines as  directed by your health care provider.  If antibiotic medicine was prescribed, take it as directed. Make sure you finish it even if you start to feel better.  Do not have sex until treatment is completed.  Tell all sexual partners that you have a vaginal infection. They should see their health care provider and be treated if they have problems, such as a mild rash or itching.  Practice safe sex by using condoms and only having one sex partner. SEEK MEDICAL CARE IF:   Your symptoms are not improving after 3 days of treatment.  You have increased discharge or pain.  You have a fever. MAKE SURE YOU:   Understand these instructions.  Will watch your condition.  Will get help right away if you are not doing well or get worse. FOR MORE INFORMATION  Centers for Disease Control and Prevention, Division of STD Prevention: SolutionApps.co.za American Sexual Health Association (ASHA): www.ashastd.org  Document Released: 03/25/2005 Document Revised: 01/13/2013 Document Reviewed: 11/04/2012 [facility] Patient Information 2015 [place], [place]. This information is not intended to replace advice given to you by your health care provider. Make sure you discuss any questions you have with your health care provider.   Abdominal Pain During Pregnancy Abdominal pain is common in pregnancy. Most of the time, it does not cause harm. There are many causes of abdominal pain. Some causes are more serious than others. Some of the causes of abdominal pain in pregnancy are easily diagnosed. Occasionally, the diagnosis takes time to understand. Other times, the cause is not determined. Abdominal pain can be a sign that something is very wrong with the pregnancy, or the pain may have nothing to do with the  pregnancy at all. For this reason, always tell your health care provider if you have any abdominal discomfort. HOME CARE INSTRUCTIONS  Monitor your abdominal pain for any changes. The following actions may help to  alleviate any discomfort you are experiencing:  Do not have sexual intercourse or put anything in your vagina until your symptoms go away completely.  Get plenty of rest until your pain improves.  Drink clear fluids if you feel nauseous. Avoid solid food as long as you are uncomfortable or nauseous.  Only take over-the-counter or prescription medicine as directed by your health care provider.  Keep all follow-up appointments with your health care provider. SEEK IMMEDIATE MEDICAL CARE IF:  You are bleeding, leaking fluid, or passing tissue from the vagina.  You have increasing pain or cramping.  You have persistent vomiting.  You have painful or bloody urination.  You have a fever.  You notice a decrease in your baby's movements.  You have extreme weakness or feel faint.  You have shortness of breath, with or without abdominal pain.  You develop a severe headache with abdominal pain.  You have abnormal vaginal discharge with abdominal pain.  You have persistent diarrhea.  You have abdominal pain that continues even after rest, or gets worse. MAKE SURE YOU:   Understand these instructions.  Will watch your condition.  Will get help right away if you are not doing well or get worse. Document Released: 03/25/2005 Document Revised: 01/13/2013 Document Reviewed: 10/22/2012 [facility] Patient Information 2015 [place], [place]. This information is not intended to replace advice given to you by your health care provider. Make sure you discuss any questions you have with your health care provider.  Round Ligament Pain During Pregnancy   Round ligament pain is a sharp pain or jabbing feeling often felt in the lower belly or groin area on one or both sides. It is one of the most common complaints during pregnancy and is considered a normal part of pregnancy. It is most often felt during the second trimester.   Here is what you need to know about round ligament pain, including some  tips to help you feel better.   Causes of Round Ligament Pain   Several thick ligaments surround and support your womb (uterus) as it grows during pregnancy. One of them is called the round ligament.   The round ligament connects the front part of the womb to your groin, the area where your legs attach to your pelvis. The round ligament normally tightens and relaxes slowly.   As your baby and womb grow, the round ligament stretches. That makes it more likely to become strained.   Sudden movements can cause the ligament to tighten quickly, like a rubber band snapping. This causes a sudden and quick jabbing feeling.   Symptoms of Round Ligament Pain   Round ligament pain can be concerning and uncomfortable. But it is considered normal as your body changes during pregnancy.   The symptoms of round ligament pain include a sharp, sudden spasm in the belly. It usually affects the right side, but it may happen on both sides. The pain only lasts a few seconds.   Exercise may cause the pain, as will rapid movements such as:  sneezing  coughing  laughing  rolling over in bed  standing up too quickly   Treatment of Round Ligament Pain   Here are some tips that may help reduce your discomfort:   Pain relief. Take over-the-counter acetaminophen for pain, if necessary.  Ask your doctor if this is OK.   Exercise. Get plenty of exercise to keep your stomach (core) muscles strong. Doing stretching exercises or prenatal yoga can be helpful. Ask your doctor which exercises are safe for you and your baby.   A helpful exercise involves putting your hands and knees on the floor, lowering your head, and pushing your backside into the air.   Avoid sudden movements. Change positions slowly (such as standing up or sitting down) to avoid sudden movements that may cause stretching and pain.   Flex your hips. Bend and flex your hips before you cough, sneeze, or laugh to avoid pulling on the ligaments.    Apply warmth. A heating pad or warm bath may be helpful. Ask your doctor if this is OK. Extreme heat can be dangerous to the baby.   You should try to modify your daily activity level and avoid positions that may worsen the condition.   When to Call the Doctor/Midwife   Always tell your doctor or midwife about any type of pain you have during pregnancy. Round ligament pain is quick and doesn't last long.   Call your health care provider immediately if you have:  severe pain  fever  chills  pain on urination  difficulty walking   Belly pain during pregnancy can be due to many different causes. It is important for your doctor to rule out more serious conditions, including pregnancy complications such as placenta abruption or non-pregnancy illnesses such as:  inguinal hernia  appendicitis  stomach, liver, and kidney problems  Preterm labor pains may sometimes be mistaken for round ligament pain.

## 2014-09-15 NOTE — MAU Note (Signed)
Pt c/o abd cramping and yellow smelly discharge for a while. abd pain increase today.

## 2014-09-16 LAB — GC/CHLAMYDIA PROBE AMP (~~LOC~~) NOT AT ARMC
Chlamydia: NEGATIVE
NEISSERIA GONORRHEA: NEGATIVE

## 2014-10-13 ENCOUNTER — Encounter: Payer: Medicaid Other | Admitting: Obstetrics and Gynecology

## 2014-10-31 LAB — OB RESULTS CONSOLE PLATELET COUNT: Platelets: 177 10*3/uL

## 2014-10-31 LAB — OB RESULTS CONSOLE HIV ANTIBODY (ROUTINE TESTING): HIV: NONREACTIVE

## 2014-10-31 LAB — OB RESULTS CONSOLE RUBELLA ANTIBODY, IGM: Rubella: IMMUNE

## 2014-10-31 LAB — OB RESULTS CONSOLE GC/CHLAMYDIA
Chlamydia: NEGATIVE
GC PROBE AMP, GENITAL: NEGATIVE

## 2014-10-31 LAB — OB RESULTS CONSOLE HEPATITIS B SURFACE ANTIGEN: HEP B S AG: NEGATIVE

## 2014-10-31 LAB — GLUCOSE TOLERANCE, 1 HOUR: Glucose 1 Hour: 103

## 2014-10-31 LAB — OB RESULTS CONSOLE ABO/RH: RH Type: POSITIVE

## 2014-10-31 LAB — OB RESULTS CONSOLE HGB/HCT, BLOOD
HCT: 36 %
Hemoglobin: 11.4 g/dL

## 2014-10-31 LAB — CULTURE, OB URINE: Urine Culture, OB: NEGATIVE

## 2014-10-31 LAB — CYTOLOGY - PAP: PAP SMEAR: NEGATIVE

## 2014-10-31 LAB — OB RESULTS CONSOLE ANTIBODY SCREEN: ANTIBODY SCREEN: NEGATIVE

## 2014-10-31 LAB — OB RESULTS CONSOLE VARICELLA ZOSTER ANTIBODY, IGG: VARICELLA IGG: IMMUNE

## 2014-10-31 LAB — CYSTIC FIBROSIS DIAGNOSTIC STUDY: Interpretation-CFDNA:: NEGATIVE

## 2014-11-03 ENCOUNTER — Other Ambulatory Visit (HOSPITAL_COMMUNITY): Payer: Self-pay | Admitting: Physician Assistant

## 2014-11-03 DIAGNOSIS — Z3689 Encounter for other specified antenatal screening: Secondary | ICD-10-CM

## 2014-11-03 DIAGNOSIS — O09522 Supervision of elderly multigravida, second trimester: Secondary | ICD-10-CM

## 2014-11-03 DIAGNOSIS — Z3A19 19 weeks gestation of pregnancy: Secondary | ICD-10-CM

## 2014-11-15 ENCOUNTER — Other Ambulatory Visit (HOSPITAL_COMMUNITY): Payer: Self-pay | Admitting: Physician Assistant

## 2014-11-15 DIAGNOSIS — Z3A19 19 weeks gestation of pregnancy: Secondary | ICD-10-CM

## 2014-11-15 DIAGNOSIS — Z3689 Encounter for other specified antenatal screening: Secondary | ICD-10-CM

## 2014-11-15 DIAGNOSIS — O09522 Supervision of elderly multigravida, second trimester: Secondary | ICD-10-CM

## 2014-11-17 ENCOUNTER — Other Ambulatory Visit (HOSPITAL_COMMUNITY): Payer: Self-pay | Admitting: *Deleted

## 2014-11-17 ENCOUNTER — Encounter: Payer: Medicaid Other | Admitting: Family Medicine

## 2014-11-17 ENCOUNTER — Ambulatory Visit (HOSPITAL_COMMUNITY)
Admission: RE | Admit: 2014-11-17 | Discharge: 2014-11-17 | Disposition: A | Discharge status: Home / Self Care | Payer: Medicaid Other | Source: Ambulatory Visit | Attending: Physician Assistant | Admitting: Physician Assistant

## 2014-11-17 DIAGNOSIS — Z3A19 19 weeks gestation of pregnancy: Secondary | ICD-10-CM | POA: Insufficient documentation

## 2014-11-17 DIAGNOSIS — Z36 Encounter for antenatal screening of mother: Secondary | ICD-10-CM | POA: Diagnosis not present

## 2014-11-17 DIAGNOSIS — Z0489 Encounter for examination and observation for other specified reasons: Secondary | ICD-10-CM

## 2014-11-17 DIAGNOSIS — IMO0002 Reserved for concepts with insufficient information to code with codable children: Secondary | ICD-10-CM

## 2014-11-17 DIAGNOSIS — O09522 Supervision of elderly multigravida, second trimester: Secondary | ICD-10-CM | POA: Insufficient documentation

## 2014-11-17 DIAGNOSIS — Z3689 Encounter for other specified antenatal screening: Secondary | ICD-10-CM

## 2014-11-17 DIAGNOSIS — O99320 Drug use complicating pregnancy, unspecified trimester: Secondary | ICD-10-CM

## 2014-11-17 DIAGNOSIS — F1910 Other psychoactive substance abuse, uncomplicated: Secondary | ICD-10-CM

## 2014-11-17 DIAGNOSIS — F209 Schizophrenia, unspecified: Secondary | ICD-10-CM

## 2014-11-29 ENCOUNTER — Encounter: Payer: Medicaid Other | Admitting: Family Medicine

## 2014-12-15 ENCOUNTER — Other Ambulatory Visit (HOSPITAL_COMMUNITY): Payer: Self-pay | Admitting: Maternal and Fetal Medicine

## 2014-12-15 ENCOUNTER — Ambulatory Visit (HOSPITAL_COMMUNITY)
Admission: RE | Admit: 2014-12-15 | Discharge: 2014-12-15 | Disposition: A | Discharge status: Home / Self Care | Payer: Medicaid Other | Source: Ambulatory Visit | Attending: Physician Assistant | Admitting: Physician Assistant

## 2014-12-15 ENCOUNTER — Encounter (HOSPITAL_COMMUNITY): Payer: Self-pay

## 2014-12-15 DIAGNOSIS — G40909 Epilepsy, unspecified, not intractable, without status epilepticus: Secondary | ICD-10-CM | POA: Diagnosis not present

## 2014-12-15 DIAGNOSIS — Z36 Encounter for antenatal screening of mother: Secondary | ICD-10-CM | POA: Diagnosis not present

## 2014-12-15 DIAGNOSIS — Z0489 Encounter for examination and observation for other specified reasons: Secondary | ICD-10-CM

## 2014-12-15 DIAGNOSIS — IMO0002 Reserved for concepts with insufficient information to code with codable children: Secondary | ICD-10-CM

## 2014-12-15 DIAGNOSIS — O99342 Other mental disorders complicating pregnancy, second trimester: Secondary | ICD-10-CM | POA: Diagnosis not present

## 2014-12-15 DIAGNOSIS — D573 Sickle-cell trait: Secondary | ICD-10-CM

## 2014-12-15 DIAGNOSIS — O09522 Supervision of elderly multigravida, second trimester: Secondary | ICD-10-CM

## 2014-12-15 DIAGNOSIS — Z3A23 23 weeks gestation of pregnancy: Secondary | ICD-10-CM

## 2014-12-15 DIAGNOSIS — O99352 Diseases of the nervous system complicating pregnancy, second trimester: Secondary | ICD-10-CM

## 2014-12-15 DIAGNOSIS — F209 Schizophrenia, unspecified: Secondary | ICD-10-CM

## 2014-12-15 DIAGNOSIS — O99012 Anemia complicating pregnancy, second trimester: Secondary | ICD-10-CM | POA: Insufficient documentation

## 2014-12-15 DIAGNOSIS — F1910 Other psychoactive substance abuse, uncomplicated: Secondary | ICD-10-CM

## 2014-12-15 DIAGNOSIS — O99320 Drug use complicating pregnancy, unspecified trimester: Secondary | ICD-10-CM

## 2014-12-19 ENCOUNTER — Ambulatory Visit (INDEPENDENT_AMBULATORY_CARE_PROVIDER_SITE_OTHER): Payer: Medicaid Other | Admitting: Obstetrics and Gynecology

## 2014-12-19 ENCOUNTER — Encounter: Payer: Self-pay | Admitting: Obstetrics and Gynecology

## 2014-12-19 VITALS — BP 127/70 | HR 87 | Temp 98.5°F | Wt 146.9 lb

## 2014-12-19 DIAGNOSIS — Z87898 Personal history of other specified conditions: Secondary | ICD-10-CM | POA: Diagnosis not present

## 2014-12-19 DIAGNOSIS — F1911 Other psychoactive substance abuse, in remission: Secondary | ICD-10-CM | POA: Insufficient documentation

## 2014-12-19 DIAGNOSIS — D573 Sickle-cell trait: Secondary | ICD-10-CM | POA: Diagnosis not present

## 2014-12-19 DIAGNOSIS — Z8679 Personal history of other diseases of the circulatory system: Secondary | ICD-10-CM | POA: Diagnosis not present

## 2014-12-19 DIAGNOSIS — O10919 Unspecified pre-existing hypertension complicating pregnancy, unspecified trimester: Secondary | ICD-10-CM | POA: Insufficient documentation

## 2014-12-19 DIAGNOSIS — Z23 Encounter for immunization: Secondary | ICD-10-CM

## 2014-12-19 DIAGNOSIS — F319 Bipolar disorder, unspecified: Secondary | ICD-10-CM | POA: Insufficient documentation

## 2014-12-19 DIAGNOSIS — O99322 Drug use complicating pregnancy, second trimester: Secondary | ICD-10-CM

## 2014-12-19 DIAGNOSIS — O0992 Supervision of high risk pregnancy, unspecified, second trimester: Secondary | ICD-10-CM | POA: Diagnosis not present

## 2014-12-19 DIAGNOSIS — F1291 Cannabis use, unspecified, in remission: Secondary | ICD-10-CM

## 2014-12-19 DIAGNOSIS — F3160 Bipolar disorder, current episode mixed, unspecified: Secondary | ICD-10-CM | POA: Diagnosis not present

## 2014-12-19 DIAGNOSIS — Z720 Tobacco use: Secondary | ICD-10-CM

## 2014-12-19 DIAGNOSIS — O0990 Supervision of high risk pregnancy, unspecified, unspecified trimester: Secondary | ICD-10-CM | POA: Insufficient documentation

## 2014-12-19 LAB — COMPREHENSIVE METABOLIC PANEL
ALT: 4 U/L — ABNORMAL LOW (ref 6–29)
AST: 10 U/L (ref 10–30)
Albumin: 3 g/dL — ABNORMAL LOW (ref 3.6–5.1)
Alkaline Phosphatase: 44 U/L (ref 33–115)
BUN: 7 mg/dL (ref 7–25)
CHLORIDE: 107 mmol/L (ref 98–110)
CO2: 22 mmol/L (ref 20–31)
CREATININE: 0.45 mg/dL — AB (ref 0.50–1.10)
Calcium: 8.1 mg/dL — ABNORMAL LOW (ref 8.6–10.2)
GLUCOSE: 75 mg/dL (ref 65–99)
POTASSIUM: 4 mmol/L (ref 3.5–5.3)
SODIUM: 138 mmol/L (ref 135–146)
Total Bilirubin: 0.5 mg/dL (ref 0.2–1.2)
Total Protein: 5.6 g/dL — ABNORMAL LOW (ref 6.1–8.1)

## 2014-12-19 LAB — POCT URINALYSIS DIP (DEVICE)
BILIRUBIN URINE: NEGATIVE
Glucose, UA: NEGATIVE mg/dL
HGB URINE DIPSTICK: NEGATIVE
Ketones, ur: NEGATIVE mg/dL
LEUKOCYTES UA: NEGATIVE
NITRITE: NEGATIVE
PH: 6.5 (ref 5.0–8.0)
Protein, ur: NEGATIVE mg/dL
Specific Gravity, Urine: 1.02 (ref 1.005–1.030)
UROBILINOGEN UA: 1 mg/dL (ref 0.0–1.0)

## 2014-12-19 LAB — CBC
HCT: 30.7 % — ABNORMAL LOW (ref 36.0–46.0)
HEMOGLOBIN: 10.6 g/dL — AB (ref 12.0–15.0)
MCH: 31.1 pg (ref 26.0–34.0)
MCHC: 34.5 g/dL (ref 30.0–36.0)
MCV: 90 fL (ref 78.0–100.0)
MPV: 10.5 fL (ref 8.6–12.4)
PLATELETS: 206 10*3/uL (ref 150–400)
RBC: 3.41 MIL/uL — ABNORMAL LOW (ref 3.87–5.11)
RDW: 14.8 % (ref 11.5–15.5)
WBC: 11.4 10*3/uL — AB (ref 4.0–10.5)

## 2014-12-19 MED ORDER — PRENATAL VITAMINS PLUS 27-1 MG PO TABS: 1.0000 | ORAL_TABLET | Freq: Every day | ORAL | Status: DC

## 2014-12-19 NOTE — Addendum Note (Signed)
Addended by: Kathee Delton on: 12/19/2014 09:22 AM   Modules accepted: Orders

## 2014-12-19 NOTE — Progress Notes (Signed)
Nutrition note: 1st visit consult Pt has gained 26.9# @ [redacted]w[redacted]d, which is > expected. Pt reports eating 3 meals & 3-4 snacks/d. Intake appears high in juice & low in water. Pt is taking a PNV. Pt reports no N&V or heartburn. Pt received verbal & written education about nutrition during pregnancy. Discussed wt gain goals of 25-35# or 1#/wk. Pt agrees to continue taking a PNV. Pt does not have WIC but plans to apply. Pt plans to BF. F/u as needed Blondell Reveal, MS, RD, LDN, [facility]

## 2014-12-19 NOTE — Progress Notes (Signed)
Subjective:  [name] is a 34 y.o. [id number] at [redacted]w[redacted]d being seen today for initial prenatal care. On abilify, says has tried many medications, this has been the best. Patient reports no complaints.  Contractions: Not present.  Vag. Bleeding: None. Movement: Present. Denies leaking of fluid.   The following portions of the patient's history were reviewed and updated as appropriate: allergies, current medications, past family history, past medical history, past social history, past surgical history and problem list.   Objective:   Filed Vitals:   12/19/14 0811  BP: 127/70  Pulse: 87  Temp: 98.5 F (36.9 C)  Weight: 146 lb 14.4 oz (66.633 kg)    Fetal Status: Fetal Heart Rate (bpm): 128 Fundal Height: 27 cm Movement: Present     General:  Alert, oriented and cooperative. Patient is in no acute distress.  Skin: Skin is warm and dry. No rash noted.   Cardiovascular: Normal heart rate noted  Respiratory: Normal respiratory effort, no problems with respiration noted  Abdomen: Soft, gravid, appropriate for gestational age. Pain/Pressure: Absent     Pelvic: Vag. Bleeding: None     Cervical exam deferred        Extremities: Normal range of motion.  Edema: None  Mental Status: Normal mood and affect. Normal behavior. Normal judgment and thought content.   Urinalysis: Urine Protein: Negative Urine Glucose: Negative  Assessment and Plan:  Pregnancy: [id number] at [redacted]w[redacted]d  1. Drug use complicating pregnancy in second trimester - UDS - sw today  # High risk pregnancy - balancing risks and benefits of 2nd generation antipsychotic, agree with continuing given pt has tried multiple other meds and Abilify is the one that works - Flu Vaccine QUAD 36+ mos IM; Standing - Flu Vaccine QUAD 36+ mos IM - pnv prescribed - has growth scan scheduled in 6 wks - 1-hour next week - CBC, hiv, rpr today  # Hx htn - never been on meds, denies hx preeclampsia, no BP elevation today. Etiology of this dx  is uncertain - will check baseline preeclampsia, am holding on starting aspirin for ppx  # Tobacco use - counseled regarding dangers, declines NRT  Preterm labor symptoms and general obstetric precautions including but not limited to vaginal bleeding, contractions, leaking of fluid and fetal movement were reviewed in detail with the patient. Please refer to After Visit Summary for other counseling recommendations.  Return in about 3 weeks (around 01/09/2015).   Kathrynn Running, MD

## 2014-12-20 LAB — DRUG SCREEN, URINE
AMPHETAMINE SCRN UR: NEGATIVE
BARBITURATE QUANT UR: NEGATIVE
Benzodiazepines.: NEGATIVE
Cocaine Metabolites: NEGATIVE
Creatinine,U: 178.84 mg/dL
MARIJUANA METABOLITE: NEGATIVE
METHADONE: NEGATIVE
Opiates: NEGATIVE
Phencyclidine (PCP): NEGATIVE
Propoxyphene: NEGATIVE

## 2014-12-20 LAB — RPR

## 2014-12-20 LAB — PROTEIN / CREATININE RATIO, URINE
Creatinine, Urine: 197.5 mg/dL
Protein Creatinine Ratio: 0.09 (ref <0.15)
Total Protein, Urine: 17 mg/dL (ref 5–24)

## 2014-12-20 LAB — HIV ANTIBODY (ROUTINE TESTING W REFLEX): HIV: NONREACTIVE

## 2014-12-21 ENCOUNTER — Encounter: Payer: Medicaid Other | Admitting: Family Medicine

## 2014-12-22 ENCOUNTER — Encounter: Payer: Medicaid Other | Admitting: Obstetrics & Gynecology

## 2015-01-12 ENCOUNTER — Telehealth: Payer: Self-pay | Admitting: Family

## 2015-01-12 ENCOUNTER — Encounter: Payer: Medicaid Other | Admitting: Family

## 2015-01-12 NOTE — Telephone Encounter (Signed)
Young man answered the phone and stated Ms. Yung has just stepped out. He will have her to call us when she comes back.

## 2015-01-26 ENCOUNTER — Ambulatory Visit (HOSPITAL_COMMUNITY)
Admission: RE | Admit: 2015-01-26 | Discharge: 2015-01-26 | Disposition: A | Discharge status: Home / Self Care | Payer: Medicaid Other | Source: Ambulatory Visit | Attending: Physician Assistant | Admitting: Physician Assistant

## 2015-01-26 ENCOUNTER — Other Ambulatory Visit (HOSPITAL_COMMUNITY): Payer: Self-pay | Admitting: Maternal and Fetal Medicine

## 2015-01-26 ENCOUNTER — Encounter (HOSPITAL_COMMUNITY): Payer: Self-pay

## 2015-01-26 ENCOUNTER — Encounter: Payer: Medicaid Other | Admitting: Family

## 2015-01-26 DIAGNOSIS — O99343 Other mental disorders complicating pregnancy, third trimester: Secondary | ICD-10-CM

## 2015-01-26 DIAGNOSIS — G40909 Epilepsy, unspecified, not intractable, without status epilepticus: Secondary | ICD-10-CM

## 2015-01-26 DIAGNOSIS — O09523 Supervision of elderly multigravida, third trimester: Secondary | ICD-10-CM

## 2015-01-26 DIAGNOSIS — Z3A29 29 weeks gestation of pregnancy: Secondary | ICD-10-CM | POA: Diagnosis not present

## 2015-01-26 DIAGNOSIS — O99352 Diseases of the nervous system complicating pregnancy, second trimester: Principal | ICD-10-CM

## 2015-01-26 DIAGNOSIS — D573 Sickle-cell trait: Secondary | ICD-10-CM

## 2015-01-26 LAB — POCT URINALYSIS DIP (DEVICE)
BILIRUBIN URINE: NEGATIVE
GLUCOSE, UA: NEGATIVE mg/dL
Hgb urine dipstick: NEGATIVE
KETONES UR: NEGATIVE mg/dL
Leukocytes, UA: NEGATIVE
Nitrite: NEGATIVE
PH: 6.5 (ref 5.0–8.0)
Protein, ur: 30 mg/dL — AB
SPECIFIC GRAVITY, URINE: 1.025 (ref 1.005–1.030)
Urobilinogen, UA: 4 mg/dL — ABNORMAL HIGH (ref 0.0–1.0)

## 2015-02-23 ENCOUNTER — Ambulatory Visit (HOSPITAL_COMMUNITY): Payer: Medicaid Other | Attending: Physician Assistant

## 2015-02-23 ENCOUNTER — Ambulatory Visit (HOSPITAL_COMMUNITY): Payer: Medicaid Other

## 2015-03-13 ENCOUNTER — Encounter: Payer: Self-pay | Admitting: Obstetrics and Gynecology

## 2015-03-13 ENCOUNTER — Other Ambulatory Visit (HOSPITAL_COMMUNITY)
Admission: RE | Admit: 2015-03-13 | Discharge: 2015-03-13 | Disposition: A | Discharge status: Home / Self Care | Payer: Medicaid Other | Source: Ambulatory Visit | Attending: Obstetrics and Gynecology | Admitting: Obstetrics and Gynecology

## 2015-03-13 ENCOUNTER — Ambulatory Visit (INDEPENDENT_AMBULATORY_CARE_PROVIDER_SITE_OTHER): Payer: Medicaid Other | Admitting: Obstetrics and Gynecology

## 2015-03-13 VITALS — BP 124/76 | HR 98 | Temp 98.2°F | Wt 154.6 lb

## 2015-03-13 DIAGNOSIS — O0993 Supervision of high risk pregnancy, unspecified, third trimester: Secondary | ICD-10-CM

## 2015-03-13 DIAGNOSIS — Z720 Tobacco use: Secondary | ICD-10-CM

## 2015-03-13 DIAGNOSIS — Z113 Encounter for screening for infections with a predominantly sexual mode of transmission: Secondary | ICD-10-CM | POA: Insufficient documentation

## 2015-03-13 DIAGNOSIS — O99333 Smoking (tobacco) complicating pregnancy, third trimester: Secondary | ICD-10-CM | POA: Diagnosis not present

## 2015-03-13 LAB — POCT URINALYSIS DIP (DEVICE)
Glucose, UA: NEGATIVE mg/dL
HGB URINE DIPSTICK: NEGATIVE
KETONES UR: NEGATIVE mg/dL
Nitrite: NEGATIVE
Protein, ur: 30 mg/dL — AB
UROBILINOGEN UA: 1 mg/dL (ref 0.0–1.0)
pH: 6.5 (ref 5.0–8.0)

## 2015-03-13 LAB — OB RESULTS CONSOLE GBS: STREP GROUP B AG: NEGATIVE

## 2015-03-13 NOTE — Progress Notes (Signed)
Edema- feet    Pain/pressure- lower abd, vaginal pressure Pt requests refill on phenergan

## 2015-03-13 NOTE — Progress Notes (Signed)
Subjective:  [name] is a 34 y.o. [id number] at 2275w4d being seen today for ongoing prenatal care.  She is currently monitored for the following issues for this high-risk pregnancy and has Tobacco abuse; Bipolar disorder (HCC); History of marijuana use; History of hypertension; Sickle cell trait (HCC); and Supervision of high-risk pregnancy on her problem list.  Patient reports backache, occasional contractions and pelvic pressure.  Contractions: Irregular. Vag. Bleeding: None.  Movement: Present. Denies leaking of fluid.   The following portions of the patient's history were reviewed and updated as appropriate: allergies, current medications, past family history, past medical history, past social history, past surgical history and problem list. Problem list updated.  Objective:   Filed Vitals:   03/13/15 0813  BP: 124/76  Pulse: 98  Temp: 98.2 F (36.8 C)  Weight: 154 lb 9.6 oz (70.126 kg)    Fetal Status: Fetal Heart Rate (bpm): 126 Fundal Height: 33 cm Movement: Present     General:  Alert, oriented and cooperative. Patient is in no acute distress.  Skin: Skin is warm and dry. No rash noted.   Cardiovascular: Normal heart rate noted  Respiratory: Normal respiratory effort, no problems with respiration noted  Abdomen: Soft, gravid, appropriate for gestational age. Pain/Pressure: Present     Pelvic: Vag. Bleeding: None Vag D/C Character: White   Cervical exam performed      1/thick/ballotable  Extremities: Normal range of motion.  Edema: Trace  Mental Status: Normal mood and affect. Normal behavior. Normal judgment and thought content.   Urinalysis: Urine Protein: 1+ (Simultaneous filing. User may not have seen previous data.) Urine Glucose: Negative (Simultaneous filing. User may not have seen previous data.)  Assessment and Plan:  Pregnancy: [id number] at 5775w4d  1. Supervision of high-risk pregnancy, third trimester Patient missed appointment because she thought she could  not be seen without Medicaid She declined 1 hour glucola today and is not interested in having it done. She also declined random CBG - Culture, beta strep (group b only) - GC/Chlamydia Probe Amp  2. Tobacco abuse   Preterm labor symptoms and general obstetric precautions including but not limited to vaginal bleeding, contractions, leaking of fluid and fetal movement were reviewed in detail with the patient. Please refer to After Visit Summary for other counseling recommendations.  Return in about 1 week (around 03/20/2015).   Catalina AntiguaPeggy Skarleth Delmonico, MD

## 2015-03-14 LAB — GC/CHLAMYDIA PROBE AMP (~~LOC~~) NOT AT ARMC
Chlamydia: NEGATIVE
Neisseria Gonorrhea: NEGATIVE

## 2015-03-15 LAB — CULTURE, BETA STREP (GROUP B ONLY)

## 2015-03-16 ENCOUNTER — Ambulatory Visit (HOSPITAL_COMMUNITY)
Admission: RE | Admit: 2015-03-16 | Payer: Medicaid Other | Source: Ambulatory Visit | Attending: Obstetrics and Gynecology | Admitting: Obstetrics and Gynecology

## 2015-03-23 ENCOUNTER — Inpatient Hospital Stay (HOSPITAL_COMMUNITY)
Admission: AD | Admit: 2015-03-23 | Discharge: 2015-03-25 | DRG: 774 | Disposition: A | Discharge status: Home / Self Care | Payer: Medicaid Other | Source: Ambulatory Visit | Attending: Family Medicine | Admitting: Family Medicine

## 2015-03-23 ENCOUNTER — Inpatient Hospital Stay (HOSPITAL_COMMUNITY): Payer: Medicaid Other | Admitting: Anesthesiology

## 2015-03-23 ENCOUNTER — Encounter (HOSPITAL_COMMUNITY): Payer: Self-pay | Admitting: Emergency Medicine

## 2015-03-23 ENCOUNTER — Encounter: Payer: Medicaid Other | Admitting: Certified Nurse Midwife

## 2015-03-23 ENCOUNTER — Encounter: Payer: Self-pay | Admitting: Certified Nurse Midwife

## 2015-03-23 DIAGNOSIS — F17200 Nicotine dependence, unspecified, uncomplicated: Secondary | ICD-10-CM

## 2015-03-23 DIAGNOSIS — F209 Schizophrenia, unspecified: Secondary | ICD-10-CM

## 2015-03-23 DIAGNOSIS — Z8249 Family history of ischemic heart disease and other diseases of the circulatory system: Secondary | ICD-10-CM

## 2015-03-23 DIAGNOSIS — O0993 Supervision of high risk pregnancy, unspecified, third trimester: Secondary | ICD-10-CM

## 2015-03-23 DIAGNOSIS — Z3A37 37 weeks gestation of pregnancy: Secondary | ICD-10-CM

## 2015-03-23 DIAGNOSIS — O09523 Supervision of elderly multigravida, third trimester: Secondary | ICD-10-CM

## 2015-03-23 DIAGNOSIS — D573 Sickle-cell trait: Secondary | ICD-10-CM | POA: Diagnosis present

## 2015-03-23 DIAGNOSIS — F1911 Other psychoactive substance abuse, in remission: Secondary | ICD-10-CM

## 2015-03-23 DIAGNOSIS — O99334 Smoking (tobacco) complicating childbirth: Secondary | ICD-10-CM | POA: Diagnosis present

## 2015-03-23 DIAGNOSIS — O1092 Unspecified pre-existing hypertension complicating childbirth: Secondary | ICD-10-CM | POA: Diagnosis present

## 2015-03-23 DIAGNOSIS — O99344 Other mental disorders complicating childbirth: Secondary | ICD-10-CM | POA: Diagnosis present

## 2015-03-23 DIAGNOSIS — O9902 Anemia complicating childbirth: Secondary | ICD-10-CM | POA: Diagnosis present

## 2015-03-23 DIAGNOSIS — O99324 Drug use complicating childbirth: Secondary | ICD-10-CM | POA: Diagnosis not present

## 2015-03-23 DIAGNOSIS — O0990 Supervision of high risk pregnancy, unspecified, unspecified trimester: Secondary | ICD-10-CM

## 2015-03-23 DIAGNOSIS — F17210 Nicotine dependence, cigarettes, uncomplicated: Secondary | ICD-10-CM | POA: Diagnosis present

## 2015-03-23 DIAGNOSIS — Z823 Family history of stroke: Secondary | ICD-10-CM | POA: Diagnosis not present

## 2015-03-23 DIAGNOSIS — F1210 Cannabis abuse, uncomplicated: Secondary | ICD-10-CM | POA: Diagnosis not present

## 2015-03-23 DIAGNOSIS — O9962 Diseases of the digestive system complicating childbirth: Secondary | ICD-10-CM | POA: Diagnosis present

## 2015-03-23 DIAGNOSIS — K219 Gastro-esophageal reflux disease without esophagitis: Secondary | ICD-10-CM | POA: Diagnosis present

## 2015-03-23 DIAGNOSIS — O0933 Supervision of pregnancy with insufficient antenatal care, third trimester: Secondary | ICD-10-CM

## 2015-03-23 DIAGNOSIS — F319 Bipolar disorder, unspecified: Secondary | ICD-10-CM | POA: Diagnosis present

## 2015-03-23 DIAGNOSIS — Z833 Family history of diabetes mellitus: Secondary | ICD-10-CM

## 2015-03-23 DIAGNOSIS — IMO0001 Reserved for inherently not codable concepts without codable children: Secondary | ICD-10-CM

## 2015-03-23 LAB — TYPE AND SCREEN
ABO/RH(D): A POS
ANTIBODY SCREEN: NEGATIVE

## 2015-03-23 LAB — CBC
HCT: 31.7 % — ABNORMAL LOW (ref 36.0–46.0)
Hemoglobin: 10.9 g/dL — ABNORMAL LOW (ref 12.0–15.0)
MCH: 30.9 pg (ref 26.0–34.0)
MCHC: 34.4 g/dL (ref 30.0–36.0)
MCV: 89.8 fL (ref 78.0–100.0)
PLATELETS: 263 10*3/uL (ref 150–400)
RBC: 3.53 MIL/uL — AB (ref 3.87–5.11)
RDW: 15 % (ref 11.5–15.5)
WBC: 15.6 10*3/uL — AB (ref 4.0–10.5)

## 2015-03-23 MED ORDER — ONDANSETRON HCL 4 MG/2ML IJ SOLN: 4.0000 mg | Freq: Four times a day (QID) | INTRAMUSCULAR | Status: DC | PRN

## 2015-03-23 MED ORDER — OXYTOCIN BOLUS FROM INFUSION
500.0000 mL | INTRAVENOUS | Status: DC
  Administered 2015-03-23: 500 mL via INTRAVENOUS

## 2015-03-23 MED ORDER — FENTANYL 2.5 MCG/ML BUPIVACAINE 1/10 % EPIDURAL INFUSION (WH - ANES)
14.0000 mL/h | INTRAMUSCULAR | Status: DC | PRN
  Administered 2015-03-23: 13 mL/h via EPIDURAL
  Filled 2015-03-23: qty 125

## 2015-03-23 MED ORDER — IBUPROFEN 600 MG PO TABS
600.0000 mg | ORAL_TABLET | Freq: Four times a day (QID) | ORAL | Status: DC
  Administered 2015-03-23 – 2015-03-25 (×6): 600 mg via ORAL
  Filled 2015-03-23 (×6): qty 1

## 2015-03-23 MED ORDER — PHENYLEPHRINE 40 MCG/ML (10ML) SYRINGE FOR IV PUSH (FOR BLOOD PRESSURE SUPPORT)
80.0000 ug | PREFILLED_SYRINGE | INTRAVENOUS | Status: DC | PRN
  Filled 2015-03-23: qty 20

## 2015-03-23 MED ORDER — LACTATED RINGERS IV SOLN
INTRAVENOUS | Status: DC
  Administered 2015-03-23 (×2): via INTRAVENOUS

## 2015-03-23 MED ORDER — BUTORPHANOL TARTRATE 1 MG/ML IJ SOLN
INTRAMUSCULAR | Status: AC
  Administered 2015-03-23: 2 mg via INTRAVENOUS
  Filled 2015-03-23: qty 2

## 2015-03-23 MED ORDER — BUTORPHANOL TARTRATE 1 MG/ML IJ SOLN
2.0000 mg | Freq: Once | INTRAMUSCULAR | Status: AC
  Administered 2015-03-23: 2 mg via INTRAVENOUS

## 2015-03-23 MED ORDER — LIDOCAINE HCL (PF) 1 % IJ SOLN
INTRAMUSCULAR | Status: DC | PRN
  Administered 2015-03-23 (×2): 4 mL via EPIDURAL

## 2015-03-23 MED ORDER — OXYTOCIN 40 UNITS IN LACTATED RINGERS INFUSION - SIMPLE MED
62.5000 mL/h | INTRAVENOUS | Status: DC
  Filled 2015-03-23: qty 1000

## 2015-03-23 MED ORDER — OXYCODONE-ACETAMINOPHEN 5-325 MG PO TABS: 2.0000 | ORAL_TABLET | ORAL | Status: DC | PRN

## 2015-03-23 MED ORDER — OXYCODONE-ACETAMINOPHEN 5-325 MG PO TABS: 1.0000 | ORAL_TABLET | ORAL | Status: DC | PRN

## 2015-03-23 MED ORDER — ACETAMINOPHEN 325 MG PO TABS: 650.0000 mg | ORAL_TABLET | ORAL | Status: DC | PRN

## 2015-03-23 MED ORDER — DIPHENHYDRAMINE HCL 50 MG/ML IJ SOLN: 12.5000 mg | INTRAMUSCULAR | Status: DC | PRN

## 2015-03-23 MED ORDER — LIDOCAINE HCL (PF) 1 % IJ SOLN
30.0000 mL | INTRAMUSCULAR | Status: DC | PRN
Start: 2015-03-23 — End: 2015-03-23
  Filled 2015-03-23: qty 30

## 2015-03-23 MED ORDER — LACTATED RINGERS IV SOLN: 500.0000 mL | INTRAVENOUS | Status: DC | PRN

## 2015-03-23 MED ORDER — EPHEDRINE 5 MG/ML INJ: 10.0000 mg | INTRAVENOUS | Status: DC | PRN

## 2015-03-23 MED ORDER — CITRIC ACID-SODIUM CITRATE 334-500 MG/5ML PO SOLN: 30.0000 mL | ORAL | Status: DC | PRN

## 2015-03-23 NOTE — Progress Notes (Signed)
Pt arrived to [facility]U via EMS with SROM and active labor. Pt 7cm dilated. Pt taken to L&D via stretcher with Dr Tomasa BlaseSchultz in attendance.

## 2015-03-23 NOTE — H&P (Signed)
OBSTETRIC ADMISSION HISTORY AND PHYSICAL  [name] is a 34 y.o. female [id number] with IUP at [redacted]w[redacted]d by [place] presenting for SROM and active labor. She reports +FMs, No LOF, no VB, no blurry vision, headaches or peripheral edema, and RUQ pain.  She plans on bottle feeding. She request Depo for birth control.  Dating: By [place] --->  Estimated Date of Delivery: 04/13/15  Sono:  , CWD, normal anatomy, cephalic presentation, 288g, 51% EFW   Prenatal History/Complications: Substance Abuse - tobacco, marijuana Insufficient Prenatal Care BPD and Schizophrenia on Abilify Chronic HTN    Clinic  [facility] Prenatal Labs  Dating  6-wk u/s Blood type: A/Positive/-- (07/25 0000)   Genetic Screen  afp quad negative Antibody:Negative (07/25 0000)  Anatomic US  wnl Rubella: Immune (07/25 0000)  GTT Early:   103            Third trimester:  RPR: NON REAC (09/12 0946)   Flu vaccine  12/19/2014 HBsAg: Negative (07/25 0000)   TDaP vaccine                                               Rhogam: HIV: NONREACTIVE (09/12 0859)   Baby Food  Formula                                             GBS: neg (For PCN allergy, check sensitivities)  Contraception  Depo-Provera Pap: wnl 10/2014  Circumcision  desires as outpatient   Pediatrician  has not found one yet   Support Person  FOB      Past Medical History: Past Medical History  Diagnosis Date  . Hypertension   . Sickle cell trait (HCC)   . Deliberate self-cutting   . Infection     MRSA in 1995, negative since  . Trichomonas infection   . Bipolar 1 disorder (HCC)   . Schizophrenia (HCC)   . Seizures (HCC)     Not recently  . Anemia   . Chronic abdominal pain   . Chronic nausea     Past Surgical History: Past Surgical History  Procedure Laterality Date  . Dilation and curettage of uterus    . Plastic surgery on face    . Hemorroidectomy  2010    Obstetrical History: OB History    Gravida Para Term Preterm AB TAB SAB Ectopic Multiple Living    0 6 0 6 0 0 6      Social History: Social History   Social History  . Marital Status: Married    Spouse Name: N/A  . Number of Children: N/A  . Years of Education: N/A   Social History Main Topics  . Smoking status: Current Every Day Smoker    Types: Cigarettes    Last Attempt to Quit: 06/10/2013  . Smokeless tobacco: Never Used  . Alcohol Use: No     Comment: hx drug use  . Drug Use: No     Comment: denies now  . Sexual Activity: Yes    Birth Control/ Protection: None   Other Topics Concern  . Not on file   Social History Narrative    Family History: Family History  Problem Relation Age of Onset  . Anesthesia  problems Neg Hx   . Cancer Mother   . Heart failure Mother   . Hypertension Mother   . Stroke Mother   . Diabetes Mother   . Hypertension Maternal Grandmother     Allergies: Allergies  Allergen Reactions  . Peanuts [Peanut Oil] Shortness Of Breath, Itching, Swelling and Rash  . Amoxicillin Hives  . Latex Hives and Itching    Prescriptions prior to admission  Medication Sig Dispense Refill Last Dose  . ARIPiprazole (ABILIFY IM) Inject into the muscle every 30 (thirty) days.   Unknown at Unknown time  . Prenatal Vit-Fe Fumarate-FA (PRENATAL VITAMINS PLUS) 27-1 MG TABS Take 1 tablet by mouth daily. (Patient not taking: Reported on 01/26/2015) 90 tablet 3 Unknown at Unknown time     Review of Systems   All systems reviewed and negative except as stated in HPI  Last menstrual period 06/21/2014, unknown if currently breastfeeding. General appearance: mild distress and slowed mentation Lungs: clear to auscultation bilaterally Heart: regular rate and rhythm Abdomen: soft, non-tender; bowel sounds normal Extremities: Homans sign is negative, no sign of DVT Presentation: cephalic Fetal monitoringBaseline: 130 bpm, Variability: Good {> 6 bpm) and Accelerations: Reactive; Early decels Uterine activityFrequency: Every 3 minutes Dilation:  7 Effacement (%): 100 Station: -2 Exam by:: dr Tomasa Blaseschultz Bulging forebag  Prenatal labs: ABO, Rh: A/Positive/-- (07/25 0000) Antibody: Negative (07/25 0000) Rubella: !Error! RPR: NON REAC (09/12 0946)  HBsAg: Negative (07/25 0000)  HIV: NONREACTIVE (09/12 0859)  GBS:   NEGATIVE 1 hr Glucola passed Genetic screening  N/A Anatomy US wnl  Prenatal Transfer Tool  Maternal Diabetes: No Genetic Screening: Declined Maternal Ultrasounds/Referrals: Normal Fetal Ultrasounds or other Referrals:  None Maternal Substance Abuse:  Yes:  Type: Smoker, Marijuana Significant Maternal Medications:  Meds include: Other: Abilify Significant Maternal Lab Results: None  No results found for this or any previous visit (from the past 24 hour(s)).  Patient Active Problem List   Diagnosis Date Noted  . Tobacco abuse 12/19/2014  . Bipolar disorder (HCC) 12/19/2014  . History of marijuana use 12/19/2014  . History of hypertension 12/19/2014  . Sickle cell trait (HCC) 12/19/2014  . Supervision of high-risk pregnancy 12/19/2014    Assessment: [name] is a 34 y.o. [id number] at 3838w0d here for SROM and active labor.   #Labor: Expectant management. AROM of forebag. Anterior lip present, likely OP presentation, so will labor down./position changes #Pain: Epidural #FWB: Excellent, early decels #ID:  GBS NEGATIVE #MOF: Bottle #MOC:Depo #Circ:  Desired  [name] 03/23/2015, 6:41 PM   I have seen and examined this patient and agree the above assessment.    CRESENZO-DISHMAN,Lyndal Alamillo 03/23/2015 8:15 PM

## 2015-03-23 NOTE — Anesthesia Preprocedure Evaluation (Addendum)
Anesthesia Evaluation  Patient identified by MRN, date of birth, ID band Patient awake    Reviewed: Allergy & Precautions, NPO status , Patient's Chart, lab work & pertinent test results  Airway Mallampati: III  TM Distance: >3 FB Neck ROM: Full    Dental  (+) Poor Dentition, Missing   Pulmonary Current Smoker,    Pulmonary exam normal breath sounds clear to auscultation       Cardiovascular hypertension, Normal cardiovascular exam Rhythm:Regular Rate:Normal     Neuro/Psych Seizures -, Well Controlled,  PSYCHIATRIC DISORDERS Anxiety Depression Bipolar Disorder Schizophrenia    GI/Hepatic GERD  Medicated and Controlled,Chronic Abdominal pain   Endo/Other  negative endocrine ROS  Renal/GU   negative genitourinary   Musculoskeletal negative musculoskeletal ROS (+)   Abdominal   Peds  Hematology  (+) Sickle cell trait and anemia ,   Anesthesia Other Findings   Reproductive/Obstetrics (+) Pregnancy 37 weeks                            Anesthesia Physical Anesthesia Plan  ASA: III  Anesthesia Plan: Epidural   Post-op Pain Management:    Induction:   Airway Management Planned: Natural Airway  Additional Equipment:   Intra-op Plan:   Post-operative Plan:   Informed Consent: I have reviewed the patients History and Physical, chart, labs and discussed the procedure including the risks, benefits and alternatives for the proposed anesthesia with the patient or authorized representative who has indicated his/her understanding and acceptance.     Plan Discussed with: Anesthesiologist  Anesthesia Plan Comments:         Anesthesia Quick Evaluation

## 2015-03-23 NOTE — Anesthesia Procedure Notes (Signed)
Epidural Patient location during procedure: OB Start time: 03/23/2015 8:25 PM  Staffing Anesthesiologist: Mal AmabileFOSTER, Addi Pak Performed by: anesthesiologist   Preanesthetic Checklist Completed: patient identified, site marked, surgical consent, pre-op evaluation, timeout performed, IV checked, risks and benefits discussed and monitors and equipment checked  Epidural Patient position: sitting Prep: site prepped and draped and DuraPrep Patient monitoring: continuous pulse ox and blood pressure Approach: midline Location: L3-L4 Injection technique: LOR air  Needle:  Needle type: Tuohy  Needle gauge: 17 G Needle length: 9 cm and 9 Needle insertion depth: 5 cm cm Catheter type: closed end flexible Catheter size: 19 Gauge Catheter at skin depth: 10 cm Test dose: negative and Other  Assessment Events: blood not aspirated, injection not painful, no injection resistance, negative IV test and no paresthesia  Additional Notes Patient identified. Risks and benefits discussed including failed block, incomplete  Pain control, post dural puncture headache, nerve damage, paralysis, blood pressure Changes, nausea, vomiting, reactions to medications-both toxic and allergic and post Partum back pain. All questions were answered. Patient expressed understanding and wished to proceed. Sterile technique was used throughout procedure. Epidural site was Dressed with sterile barrier dressing. No paresthesias, signs of intravascular injection Or signs of intrathecal spread were encountered.  Patient was more comfortable after the epidural was dosed. Please see RN's note for documentation of vital signs and FHR which are stable.

## 2015-03-24 LAB — RPR: RPR Ser Ql: NONREACTIVE

## 2015-03-24 MED ORDER — PRENATAL MULTIVITAMIN CH
1.0000 | ORAL_TABLET | Freq: Every day | ORAL | Status: DC
  Administered 2015-03-24: 1 via ORAL
  Filled 2015-03-24: qty 1

## 2015-03-24 MED ORDER — METHYLERGONOVINE MALEATE 0.2 MG/ML IJ SOLN: 0.2000 mg | INTRAMUSCULAR | Status: DC | PRN

## 2015-03-24 MED ORDER — FERROUS SULFATE 325 (65 FE) MG PO TABS
325.0000 mg | ORAL_TABLET | Freq: Two times a day (BID) | ORAL | Status: DC
  Administered 2015-03-24 – 2015-03-25 (×3): 325 mg via ORAL
  Filled 2015-03-24 (×3): qty 1

## 2015-03-24 MED ORDER — ONDANSETRON HCL 4 MG PO TABS: 4.0000 mg | ORAL_TABLET | ORAL | Status: DC | PRN

## 2015-03-24 MED ORDER — ONDANSETRON HCL 4 MG/2ML IJ SOLN: 4.0000 mg | INTRAMUSCULAR | Status: DC | PRN

## 2015-03-24 MED ORDER — DIBUCAINE 1 % RE OINT: 1.0000 "application " | TOPICAL_OINTMENT | RECTAL | Status: DC | PRN

## 2015-03-24 MED ORDER — FLEET ENEMA 7-19 GM/118ML RE ENEM: 1.0000 | ENEMA | Freq: Every day | RECTAL | Status: DC | PRN

## 2015-03-24 MED ORDER — LANOLIN HYDROUS EX OINT: TOPICAL_OINTMENT | CUTANEOUS | Status: DC | PRN

## 2015-03-24 MED ORDER — WITCH HAZEL-GLYCERIN EX PADS: 1.0000 "application " | MEDICATED_PAD | CUTANEOUS | Status: DC | PRN

## 2015-03-24 MED ORDER — BENZOCAINE-MENTHOL 20-0.5 % EX AERO: 1.0000 "application " | INHALATION_SPRAY | CUTANEOUS | Status: DC | PRN

## 2015-03-24 MED ORDER — METHYLERGONOVINE MALEATE 0.2 MG PO TABS: 0.2000 mg | ORAL_TABLET | ORAL | Status: DC | PRN

## 2015-03-24 MED ORDER — ACETAMINOPHEN 325 MG PO TABS: 650.0000 mg | ORAL_TABLET | ORAL | Status: DC | PRN

## 2015-03-24 MED ORDER — DIPHENHYDRAMINE HCL 25 MG PO CAPS: 25.0000 mg | ORAL_CAPSULE | Freq: Four times a day (QID) | ORAL | Status: DC | PRN

## 2015-03-24 MED ORDER — SENNOSIDES-DOCUSATE SODIUM 8.6-50 MG PO TABS
2.0000 | ORAL_TABLET | ORAL | Status: DC
  Administered 2015-03-24 – 2015-03-25 (×2): 2 via ORAL
  Filled 2015-03-24 (×2): qty 2

## 2015-03-24 MED ORDER — OXYCODONE-ACETAMINOPHEN 5-325 MG PO TABS
2.0000 | ORAL_TABLET | ORAL | Status: DC | PRN
  Administered 2015-03-24: 2 via ORAL
  Filled 2015-03-24: qty 2

## 2015-03-24 MED ORDER — OXYCODONE-ACETAMINOPHEN 5-325 MG PO TABS
1.0000 | ORAL_TABLET | ORAL | Status: DC | PRN
Start: 2015-03-24 — End: 2015-03-25
  Administered 2015-03-24 – 2015-03-25 (×5): 1 via ORAL
  Filled 2015-03-24 (×5): qty 1

## 2015-03-24 MED ORDER — ZOLPIDEM TARTRATE 5 MG PO TABS: 5.0000 mg | ORAL_TABLET | Freq: Every evening | ORAL | Status: DC | PRN

## 2015-03-24 MED ORDER — BISACODYL 10 MG RE SUPP: 10.0000 mg | Freq: Every day | RECTAL | Status: DC | PRN

## 2015-03-24 MED ORDER — MEASLES, MUMPS & RUBELLA VAC ~~LOC~~ INJ
0.5000 mL | INJECTION | Freq: Once | SUBCUTANEOUS | Status: DC
  Filled 2015-03-24: qty 0.5

## 2015-03-24 MED ORDER — SIMETHICONE 80 MG PO CHEW: 80.0000 mg | CHEWABLE_TABLET | ORAL | Status: DC | PRN

## 2015-03-24 MED ORDER — OXYTOCIN 40 UNITS IN LACTATED RINGERS INFUSION - SIMPLE MED: 62.5000 mL/h | INTRAVENOUS | Status: DC | PRN

## 2015-03-24 MED ORDER — TETANUS-DIPHTH-ACELL PERTUSSIS 5-2.5-18.5 LF-MCG/0.5 IM SUSP: 0.5000 mL | Freq: Once | INTRAMUSCULAR | Status: DC

## 2015-03-24 NOTE — Progress Notes (Signed)
Post Partum Day 1 Subjective: no complaints, up ad lib, voiding, tolerating PO and Cramping a lot  Objective: Blood pressure 122/76, pulse 66, temperature 98.3 F (36.8 C), temperature source Oral, resp. rate 18, height 5\' 3"  (1.6 m), weight 71.215 kg (157 lb), last menstrual period 06/21/2014, SpO2 100 %, unknown if currently breastfeeding.  Physical Exam:  General: alert, cooperative and no distress Lochia: appropriate Uterine Fundus: firm Incision: n/a  DVT Evaluation: No evidence of DVT seen on physical exam.   Recent Labs  03/23/15 1850  HGB 10.9*  HCT 31.7*    Assessment/Plan: Plan for discharge tomorrow and Breastfeeding   LOS: 1 day   [facility] CenterWILLIAMS,Aleathea Pugmire 03/24/2015, 9:51 AM
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
## 2015-03-24 NOTE — Progress Notes (Signed)
CSW spoke with A. Guerrero after initial assessment completed with MOB.    CPS requested that a Team Decision Meeting be scheduled for 12/19.  CSW informed CPS the infant must be discharged when medically stable. CPS worker was on phone with her supervisor, Gail Spinks, and CSW again reviewed that infant must be discharged once medically stable.  CPS supervisor requested to speak with infant's pediatrician.   CSW consulted with pediatrician, who spoke with CPS supervisor. CPS faxed a letter to CSW stating that DHHS is requesting that infant remain in the hospital until a TDM can be held on 12/19 since it would be an unsafe discharge plan for infant to be discharged to the care of the MOB.   Copy of letter placed on infant's chart.   A TDM has been schedule for 12/19 at 10:00am in Classroom 4. CSW to attend.  

## 2015-03-24 NOTE — Progress Notes (Signed)
CSW originally received request for consult due to MOB presenting with limited prenatal care, history of THC use, and diagnoses of bipolar and schizophrenia. CSW aware that MOB does not have custody of her 5 other children and are currently in foster care.     CSW informed by RN that MOB is currently not taking Abilify or any other psychotropic medications.   CSW made CPS report in [name] due to these acute concerns.    CSW to follow up with MOB in order to complete full assessment.  

## 2015-03-24 NOTE — Anesthesia Postprocedure Evaluation (Signed)
Anesthesia Post Note  Patient: [name]  Procedure(s) Performed: * No procedures listed *  Patient location during evaluation: Mother Baby Anesthesia Type: Epidural Level of consciousness: awake, awake and alert, oriented and patient cooperative Pain management: pain level controlled Vital Signs Assessment: post-procedure vital signs reviewed and stable Respiratory status: spontaneous breathing, nonlabored ventilation and respiratory function stable Cardiovascular status: stable Postop Assessment: no headache, no backache, patient able to bend at knees and no signs of nausea or vomiting Anesthetic complications: no    Last Vitals:  Filed Vitals:   03/24/15 0100 03/24/15 0506  BP: 125/72 122/76  Pulse: 72 66  Temp: 36.8 C 36.8 C  Resp: 18 18    Last Pain:  Filed Vitals:   03/24/15 0615  PainSc: 8                  Shakir Petrosino L

## 2015-03-25 DIAGNOSIS — IMO0001 Reserved for inherently not codable concepts without codable children: Secondary | ICD-10-CM

## 2015-03-25 MED ORDER — FERROUS SULFATE 325 (65 FE) MG PO TABS: 325.0000 mg | ORAL_TABLET | Freq: Two times a day (BID) | ORAL | Status: DC

## 2015-03-25 MED ORDER — IBUPROFEN 600 MG PO TABS: 600.0000 mg | ORAL_TABLET | Freq: Four times a day (QID) | ORAL | Status: DC

## 2015-03-25 NOTE — Discharge Summary (Signed)
OB Discharge Summary     Patient Name: [name] DOB: [birth year] MRN: [id number]  Date of admission: 03/23/2015 Delivering MD: Jacklyn ShellRESENZO-DISHMON, FRANCES   Date of discharge: 03/25/2015  Admitting diagnosis: 37WKS CONTRACTIONS WATER BROKE Intrauterine pregnancy: 443w0d     Secondary diagnosis:  Active Problems:   Bipolar disorder (HCC)   History of marijuana use   Supervision of high-risk pregnancy   Active labor at term   Status post vaginal delivery  Additional problems: None      Discharge diagnosis: Term Pregnancy Delivered                                                                                                Post partum procedures:None  Augmentation: None   Complications: None  Hospital course:  Onset of Labor With Vaginal Delivery     34 y.o. yo [id number] at 683w0d was admitted in Active Laboron 03/23/2015. Patient had an uncomplicated labor course as follows:  Membrane Rupture Time/Date: 7:20 PM ,03/23/2015   Intrapartum Procedures: Episiotomy: None [1]                                         Lacerations:  None [1]  Patient had a delivery of a Viable infant. 03/23/2015  Information for the patient's newborn:  Rosezetta SchlatterHilliard, Boy Annalyse [[id number]][[id number]]  Delivery Method: Vaginal, Spontaneous Delivery (Filed from Delivery Summary)    Pateint had an uncomplicated postpartum course.  She is ambulating, tolerating a regular diet, passing flatus, and urinating well. Patient is discharged home in stable condition on 03/25/2015    Physical exam  Filed Vitals:   03/24/15 0506 03/24/15 1030 03/24/15 1845 03/25/15 0520  BP: 122/76 128/78 111/70 117/78  Pulse: 66 67 66 60  Temp: 98.3 F (36.8 C) 97.8 F (36.6 C) 97.8 F (36.6 C) 98.1 F (36.7 C)  TempSrc: Oral Oral Oral Oral  Resp: 18 18 18 18   Height:      Weight:      SpO2:  99%     General: alert, cooperative and no distress Lochia: appropriate Uterine Fundus: firm Incision: N/A DVT Evaluation: No  evidence of DVT seen on physical exam. Labs: Lab Results  Component Value Date   WBC 15.6* 03/23/2015   HGB 10.9* 03/23/2015   HCT 31.7* 03/23/2015   MCV 89.8 03/23/2015   PLT 263 03/23/2015   CMP Latest Ref Rng 12/19/2014  Glucose 65 - 99 mg/dL 75  BUN 7 - 25 mg/dL 7  Creatinine 9.620.50 - 9.521.10 mg/dL 8.41(L0.45(L)  Sodium 244135 - 010146 mmol/L 138  Potassium 3.5 - 5.3 mmol/L 4.0  Chloride 98 - 110 mmol/L 107  CO2 20 - 31 mmol/L 22  Calcium 8.6 - 10.2 mg/dL 8.1(L)  Total Protein 6.1 - 8.1 g/dL 2.7(O5.6(L)  Total Bilirubin 0.2 - 1.2 mg/dL 0.5  Alkaline Phos 33 - 115 U/L 44  AST 10 - 30 U/L 10  ALT 6 - 29 U/L 4(L)    Discharge instruction: per After Visit Summary  and "Baby and Me Booklet".  After visit meds:    Medication List    STOP taking these medications        promethazine 25 MG tablet  Commonly known as:  PHENERGAN      TAKE these medications        ferrous sulfate 325 (65 FE) MG tablet  Take 1 tablet (325 mg total) by mouth 2 (two) times daily with a meal.     ibuprofen 600 MG tablet  Commonly known as:  ADVIL,MOTRIN  Take 1 tablet (600 mg total) by mouth every 6 (six) hours.     PRENATAL VITAMINS PLUS 27-1 MG Tabs  Take 1 tablet by mouth daily.        Diet: routine diet  Activity: Advance as tolerated. Pelvic rest for 6 weeks.   Outpatient follow up:6 weeks Follow up Appt:Future Appointments Date Time Provider Department Center  05/01/2015 12:45 PM Adam Phenix, MD [facility]   Postpartum contraception: Depo Provera  Newborn Data: Live born female  Birth Weight: 6 lb 8.1 oz (2950 g) APGAR: 9, 9  Baby Feeding: Bottle Disposition:rooming in   03/25/2015 Hailey Angelene Giovanni, MD  OB fellow attestation I have seen and examined this patient and agree with above documentation in the resident's note.   [name] is a 34 y.o. [id number] s/p NSVD.   Pain is well controlled.  Plan for birth control is Depo-Provera.  Method of Feeding: formula.   PE:  BP  117/78 mmHg  Pulse 60  Temp(Src) 98.1 F (36.7 C) (Oral)  Resp 18  Ht  (1.6 m)  Wt 157 lb (71.215 kg)  BMI 27.82 kg/m2  SpO2 99%  LMP 06/21/2014 (Approximate)  Breastfeeding? Unknown Gen: well appearing Heart: reg rate Lungs: normal WOB Fundus firm Ext: soft, no pain, no edema   Recent Labs  03/23/15 1850  HGB 10.9*  HCT 31.7*   Plan: discharge today - postpartum care discussed - f/u clinic in 6 weeks for postpartum visit - Baby love for mood check    Federico Flake, MD 6:01 PM

## 2015-03-25 NOTE — Discharge Instructions (Signed)

## 2015-05-01 ENCOUNTER — Ambulatory Visit: Payer: Medicaid Other | Admitting: Advanced Practice Midwife

## 2015-05-17 ENCOUNTER — Encounter: Payer: Self-pay | Admitting: Obstetrics and Gynecology

## 2015-05-17 ENCOUNTER — Ambulatory Visit (INDEPENDENT_AMBULATORY_CARE_PROVIDER_SITE_OTHER): Payer: Medicaid Other | Admitting: Obstetrics and Gynecology

## 2015-05-17 DIAGNOSIS — N898 Other specified noninflammatory disorders of vagina: Secondary | ICD-10-CM

## 2015-05-17 NOTE — Progress Notes (Signed)
  Subjective:     [name] is a 35 y.o. female who presents for a postpartum visit. She is 7 weeks postpartum following a spontaneous vaginal delivery. I have fully reviewed the prenatal and intrapartum course. The delivery was at 37 gestational weeks. Outcome: spontaneous vaginal delivery. Anesthesia: epidural. Postpartum course has been uncomplicated. Baby's course has been uncomplicated. Baby is feeding by bottle. Patient is currently in the custody of her godmother. She does not have custody of her other children either. Bleeding none. Bowel function is normal.   Bladder function is normal. Patient is sexually active without contraception as recently as last evening. Contraception method desired is Depo Provera. She has had unprotected sex several times since delivery of baby - most recently last night. Postpartum depression screening: negative.  Pt reports having a discharge x1 week w/odor. She also has abdominal pain x1 week.      Review of Systems Pertinent items are noted in HPI.   Objective:    There were no vitals taken for this visit.  General:  alert, cooperative and no distress   Breasts:  inspection negative, no nipple discharge or bleeding, no masses or nodularity palpable  Lungs: clear to auscultation bilaterally  Heart:  regular rate and rhythm  Abdomen: soft, non-tender; bowel sounds normal; no masses,  no organomegaly   Vulva:  normal  Vagina: normal vagina, no discharge, exudate, lesion, or erythema  Cervix:  multiparous appearance  Corpus: normal size, contour, position, consistency, mobility, non-tender  Adnexa:  normal adnexa and no mass, fullness, tenderness  Rectal Exam: Not performed.        Assessment:     Normal postpartum exam. Pap smear not done at today's visit.   Plan:    1. Contraception: patient to return in 2 week for depo-provera. She was advised to remain abstinent or use condoms 2. Patient is medically cleared to resume all activities of  daily living 3. Follow up in: 2 weeks for depo-provera or as needed.

## 2015-05-18 LAB — WET PREP, GENITAL
TRICH WET PREP: NONE SEEN
WBC, Wet Prep HPF POC: NONE SEEN
YEAST WET PREP: NONE SEEN

## 2015-05-18 MED ORDER — METRONIDAZOLE 500 MG PO TABS: 500.0000 mg | ORAL_TABLET | Freq: Two times a day (BID) | ORAL | Status: DC

## 2015-05-18 NOTE — Addendum Note (Signed)
Addended by: Catalina Antigua on: 05/18/2015 01:54 PM   Modules accepted: Orders

## 2015-05-19 ENCOUNTER — Telehealth: Payer: Self-pay

## 2015-05-19 NOTE — Telephone Encounter (Signed)
Pt has been informed of lab results. 

## 2015-05-31 ENCOUNTER — Ambulatory Visit: Payer: Medicaid Other

## 2015-06-05 ENCOUNTER — Ambulatory Visit: Payer: Medicaid Other

## 2015-09-09 ENCOUNTER — Inpatient Hospital Stay (HOSPITAL_COMMUNITY)
Admission: AD | Admit: 2015-09-09 | Discharge: 2015-09-09 | Disposition: A | Discharge status: Home / Self Care | Payer: Medicaid Other | Source: Ambulatory Visit | Attending: Family Medicine | Admitting: Family Medicine

## 2015-09-09 ENCOUNTER — Inpatient Hospital Stay (HOSPITAL_COMMUNITY): Payer: Medicaid Other

## 2015-09-09 ENCOUNTER — Encounter (HOSPITAL_COMMUNITY): Payer: Self-pay | Admitting: Advanced Practice Midwife

## 2015-09-09 DIAGNOSIS — O23591 Infection of other part of genital tract in pregnancy, first trimester: Secondary | ICD-10-CM | POA: Insufficient documentation

## 2015-09-09 DIAGNOSIS — O99331 Smoking (tobacco) complicating pregnancy, first trimester: Secondary | ICD-10-CM | POA: Diagnosis not present

## 2015-09-09 DIAGNOSIS — O209 Hemorrhage in early pregnancy, unspecified: Secondary | ICD-10-CM | POA: Diagnosis not present

## 2015-09-09 DIAGNOSIS — B9689 Other specified bacterial agents as the cause of diseases classified elsewhere: Secondary | ICD-10-CM

## 2015-09-09 DIAGNOSIS — N939 Abnormal uterine and vaginal bleeding, unspecified: Secondary | ICD-10-CM | POA: Diagnosis present

## 2015-09-09 DIAGNOSIS — Z202 Contact with and (suspected) exposure to infections with a predominantly sexual mode of transmission: Secondary | ICD-10-CM

## 2015-09-09 DIAGNOSIS — O4691 Antepartum hemorrhage, unspecified, first trimester: Secondary | ICD-10-CM

## 2015-09-09 DIAGNOSIS — O4690 Antepartum hemorrhage, unspecified, unspecified trimester: Secondary | ICD-10-CM

## 2015-09-09 DIAGNOSIS — Z3A01 Less than 8 weeks gestation of pregnancy: Secondary | ICD-10-CM | POA: Insufficient documentation

## 2015-09-09 DIAGNOSIS — F17210 Nicotine dependence, cigarettes, uncomplicated: Secondary | ICD-10-CM | POA: Diagnosis not present

## 2015-09-09 DIAGNOSIS — O26899 Other specified pregnancy related conditions, unspecified trimester: Secondary | ICD-10-CM

## 2015-09-09 DIAGNOSIS — Z3491 Encounter for supervision of normal pregnancy, unspecified, first trimester: Secondary | ICD-10-CM

## 2015-09-09 DIAGNOSIS — R109 Unspecified abdominal pain: Secondary | ICD-10-CM | POA: Diagnosis not present

## 2015-09-09 DIAGNOSIS — N760 Acute vaginitis: Secondary | ICD-10-CM

## 2015-09-09 HISTORY — DX: Chlamydial infection, unspecified: A74.9

## 2015-09-09 HISTORY — DX: Herpesviral infection, unspecified: B00.9

## 2015-09-09 LAB — URINALYSIS, ROUTINE W REFLEX MICROSCOPIC
BILIRUBIN URINE: NEGATIVE
GLUCOSE, UA: NEGATIVE mg/dL
Hgb urine dipstick: NEGATIVE
KETONES UR: NEGATIVE mg/dL
Nitrite: NEGATIVE
PH: 7 (ref 5.0–8.0)
PROTEIN: NEGATIVE mg/dL
Specific Gravity, Urine: 1.015 (ref 1.005–1.030)

## 2015-09-09 LAB — CBC
HCT: 35 % — ABNORMAL LOW (ref 36.0–46.0)
Hemoglobin: 12.2 g/dL (ref 12.0–15.0)
MCH: 30.1 pg (ref 26.0–34.0)
MCHC: 34.9 g/dL (ref 30.0–36.0)
MCV: 86.4 fL (ref 78.0–100.0)
PLATELETS: 195 10*3/uL (ref 150–400)
RBC: 4.05 MIL/uL (ref 3.87–5.11)
RDW: 14.8 % (ref 11.5–15.5)
WBC: 10.2 10*3/uL (ref 4.0–10.5)

## 2015-09-09 LAB — URINE MICROSCOPIC-ADD ON
Bacteria, UA: NONE SEEN
RBC / HPF: NONE SEEN RBC/hpf (ref 0–5)

## 2015-09-09 LAB — WET PREP, GENITAL
SPERM: NONE SEEN
Trich, Wet Prep: NONE SEEN
YEAST WET PREP: NONE SEEN

## 2015-09-09 LAB — HCG, QUANTITATIVE, PREGNANCY: hCG, Beta Chain, Quant, S: 99983 m[IU]/mL — ABNORMAL HIGH (ref <5)

## 2015-09-09 LAB — POCT PREGNANCY, URINE: Preg Test, Ur: POSITIVE — AB

## 2015-09-09 MED ORDER — METRONIDAZOLE 500 MG PO TABS: 500.0000 mg | ORAL_TABLET | Freq: Two times a day (BID) | ORAL | Status: DC

## 2015-09-09 MED ORDER — CEFTRIAXONE SODIUM 250 MG IJ SOLR
250.0000 mg | Freq: Once | INTRAMUSCULAR | Status: AC
  Administered 2015-09-09: 250 mg via INTRAMUSCULAR
  Filled 2015-09-09: qty 250

## 2015-09-09 MED ORDER — AZITHROMYCIN 250 MG PO TABS
1000.0000 mg | ORAL_TABLET | Freq: Once | ORAL | Status: AC
  Administered 2015-09-09: 1000 mg via ORAL
  Filled 2015-09-09: qty 4

## 2015-09-09 MED ORDER — CONCEPT OB 130-92.4-1 MG PO CAPS: 1.0000 | ORAL_CAPSULE | Freq: Every day | ORAL | Status: DC

## 2015-09-09 NOTE — MAU Note (Signed)
Pt states yesterday she had one gush of blood. Pt states she has pain in lower abdomen of 8/10. Pt states she is having some vaginal discharge.

## 2015-09-09 NOTE — Discharge Instructions (Signed)
Vaginal Bleeding During Pregnancy, First Trimester A small amount of bleeding (spotting) from the vagina is relatively common in early pregnancy. It usually stops on its own. Various things may cause bleeding or spotting in early pregnancy. Some bleeding may be related to the pregnancy, and some may not. In most cases, the bleeding is normal and is not a problem. However, bleeding can also be a sign of something serious. Be sure to tell your health care provider about any vaginal bleeding right away. Some possible causes of vaginal bleeding during the first trimester include:  Infection or inflammation of the cervix.  Growths (polyps) on the cervix.  Miscarriage or threatened miscarriage.  Pregnancy tissue has developed outside of the uterus and in a fallopian tube (tubal pregnancy).  Tiny cysts have developed in the uterus instead of pregnancy tissue (molar pregnancy). HOME CARE INSTRUCTIONS  Watch your condition for any changes. The following actions may help to lessen any discomfort you are feeling:  Follow your health care provider's instructions for limiting your activity. If your health care provider orders bed rest, you may need to stay in bed and only get up to use the bathroom. However, your health care provider may allow you to continue light activity.  If needed, make plans for someone to help with your regular activities and responsibilities while you are on bed rest.  Keep track of the number of pads you use each day, how often you change pads, and how soaked (saturated) they are. Write this down.  Do not use tampons. Do not douche.  Do not have sexual intercourse or orgasms until approved by your health care provider.  If you pass any tissue from your vagina, save the tissue so you can show it to your health care provider.  Only take over-the-counter or prescription medicines as directed by your health care provider.  Do not take aspirin because it can make you  bleed.  Keep all follow-up appointments as directed by your health care provider. SEEK MEDICAL CARE IF:  You have any vaginal bleeding during any part of your pregnancy.  You have cramps or labor pains.  You have a fever, not controlled by medicine. SEEK IMMEDIATE MEDICAL CARE IF:   You have severe cramps in your back or belly (abdomen).  You pass large clots or tissue from your vagina.  Your bleeding increases.  You feel light-headed or weak, or you have fainting episodes.  You have chills.  You are leaking fluid or have a gush of fluid from your vagina.  You pass out while having a bowel movement. MAKE SURE YOU:  Understand these instructions.  Will watch your condition.  Will get help right away if you are not doing well or get worse.   This information is not intended to replace advice given to you by your health care provider. Make sure you discuss any questions you have with your health care provider.   Document Released: 01/02/2005 Document Revised: 03/30/2013 Document Reviewed: 11/30/2012 Elsevier Interactive Patient Education 2016 [employer].  Pregnancy and Sexually Transmitted Diseases A sexually transmitted disease (STD) is a disease or infection that may be passed (transmitted) from person to person, usually during sexual activity. This may happen by way of saliva, semen, blood, vaginal mucus, or urine. An STD can be caused by bacteria, viruses, or parasites.  During pregnancy, STDs can be dangerous for both you and your unborn baby. It is important to take steps to reduce your chances of getting an STD. Also,  you need to be looked at by your health care provider right away if you think you may have an STD or may have been exposed to an STD. Diagnosis and treatment will depend on the type of STD. If you are already pregnant, you will be screened for HIV (human immunodeficiency virus) early in your pregnancy. If you are at high risk for HIV, this test may be  repeated during your third trimester of pregnancy. WHAT ARE SOME COMMON STDs? There are different types of STDs. Some STDs that cause problems in pregnancy include:  Gonorrhea.   Chlamydia.   Syphilis.   HIV and AIDS.   Genital herpes.   Hepatitis.   Genital warts.   Human papillomavirus (HPV). STDs that do not affect the baby include:   Trichomonas.   Pubic lice.  WHAT ARE THE POSSIBLE EFFECTS OF STDs DURING PREGNANCY? STDs can have various effects during pregnancy. STDs can cause:   Stillbirth.   Miscarriage.   Premature labor.   Premature rupture of the membranes.   Serious birth defects or deformities.   Infection of the amniotic sac.   Infections that occur after birth (postpartum) in you and the baby.   Slowed growth of the baby before birth.   Illnesses in newborns.  WHAT ARE COMMON SYMPTOMS OF STDs? Different STDs have different symptoms. Some women may not have any symptoms. If symptoms are present, they may include:  Painful or bloody urination.   Pain in the pelvis, abdomen, vagina, anus, throat, or eyes.  A skin rash, itching, or irritation.   Growths, ulcerations, blisters, or sores in the genital and anal areas.   Fever.   Abnormal vaginal discharge with or without bad odor.   Pain or bleeding during sexual intercourse.   Yellow skin and eyes (jaundice). This is seen with hepatitis.   Swollen glands in the groin area.  Even if symptoms are not present, an STD can still be passed to another person during sexual contact.  HOW ARE STDs DIAGNOSED? Your health care provider can determine if you have an STD through different tests. These can include blood tests, urine tests, and tests performed during a pelvic exam. You should be screened for sexually transmitted illnesses (STIs), including gonorrhea and chlamydia if:   You are sexually active and are younger than 36 years old.  You are older than 36 years old and  your health care provider tells you that you are at risk for this type of infection.  Your sexual activity has changed since you were last screened, and you are at an increased risk for chlamydia or gonorrhea. Ask your health care provider if you are at risk. HOW CAN I REDUCE MY RISK OF GETTING AN STD?  Take these steps to reduce your risk of getting an STD:  Use a latex condom or female condom during sexual intercourse.   Use dental dams and water-soluble lubricants during sexual activity. Do not use petroleum jelly or oils.  Avoid having multiple sex partners.  Do not have sex with someone who has other sex partners.  Do not have sex with anyone you do not know or who is at high risk for an STD.   Avoid risky sex acts that can break the skin.  Do not have sex if you have open sores on your mouth or skin.  Avoid engaging in oral and anal sex acts.   Get the hepatitis vaccine. It is safe for pregnant women.  WHAT SHOULD I DO  IF I THINK I HAVE AN STD?  See your health care provider.  Tell your sexual partner(s). They should be tested and treated for any STDs.  Do not have sex until your health care provider says it is okay. WHEN SHOULD I GET IMMEDIATE MEDICAL CARE? Contact your health care provider right away if:   You have any symptoms of an STD.  You think you or your sex partner has an STD, even if there are no symptoms.  You think you may have been exposed to an STD.   This information is not intended to replace advice given to you by your health care provider. Make sure you discuss any questions you have with your health care provider.   Document Released: 05/02/2004 Document Revised: 04/15/2014 Document Reviewed: 10/22/2012 Elsevier Interactive Patient Education [employer]2016 [employer]

## 2015-09-09 NOTE — MAU Provider Note (Signed)
Chief Complaint: Vaginal Bleeding   First Provider Initiated Contact with Patient 09/09/2015 at 1600.   SUBJECTIVE HPI: [name] is a 35 y.o. [id number] female who presents to Maternity Admissions reporting suspicion of pregnancy due to missed period last month, abd pain, vaginal bleeding and one episode of heavy bleeding two days ago. LMP 08/21/15, longer then usual (10 days)    Patient is separated from her husband and conceived with a new partner. Is concerned that he could have given her an STD.  Location: Suprapubic Quality: Cramping Severity: 8/10 on pain scale Duration: 2 days Course: Unchanged Context: None Timing: Intermittent Modifying factors: No aggravating or alleviating factors. Hasn't tried anything to treat the pain.  Associated signs and symptoms: Positive for vaginal bleeding, vaginal discharge. Negative for fever, chills, passage of clots or tissue, urinary complaints or GI complaints.  Past Medical History  Diagnosis Date  . Hypertension   . Sickle cell trait (HCC)   . Deliberate self-cutting   . Infection     MRSA in 1995, negative since  . Trichomonas infection   . Bipolar 1 disorder (HCC)   . Schizophrenia (HCC)   . Seizures (HCC)     Not recently  . Anemia   . Chronic abdominal pain   . Chronic nausea   . HSV-2 infection 2015  . Chlamydia    OB History  Gravida Para Term Preterm AB SAB TAB Ectopic Multiple Living  0 6 6 0 0 0 7    # Outcome Date GA Lbr Len/2nd Weight Sex Delivery Anes PTL Lv  14 Current           13 Term 03/23/15 [redacted]w[redacted]d 01:16 / 01:49 6 lb 8.1 oz (2.95 kg) Genella Mech EPI  Y  12 Term 11/28/13 [redacted]w[redacted]d 17:02 / 00:14  M Vag-Spont EPI  Y  11 Term 04/28/11 [redacted]w[redacted]d 07:06 / 00:12 7 lb (3.175 kg) M Vag-Spont EPI  Y     Comments: wnl  10 Term 2011 [redacted]w[redacted]d  7 lb 9 oz (3.43 kg) F Vag-Spont   Y  9 Term 2003   7 lb 13 oz (3.544 kg) F Vag-Spont   Y  8 SAB           7 SAB           6 SAB           5 SAB           4 SAB           3 SAB            2 Term    6 lb 13 oz (3.09 kg) F Vag-Spont   Y  1 Term    7 lb 13 oz (3.544 kg) F Vag-Spont   Y     Past Surgical History  Procedure Laterality Date  . Dilation and curettage of uterus    . Plastic surgery on face    . Hemorroidectomy  2010   Social History   Social History  . Marital Status: Legally Separated    Spouse Name: N/A  . Number of Children: N/A  . Years of Education: N/A   Occupational History  . Not on file.   Social History Main Topics  . Smoking status: Current Every Day Smoker -- 0.50 packs/day for 1.5 years    Types: Cigarettes  . Smokeless tobacco: Never Used  . Alcohol Use: No     Comment: hx drug use  .  Drug Use: No     Comment: past use  . Sexual Activity: Yes    Birth Control/ Protection: None     Comment: desires Depo Provera   Other Topics Concern  . Not on file   Social History Narrative   No current facility-administered medications on file prior to encounter.   Current Outpatient Prescriptions on File Prior to Encounter  Medication Sig Dispense Refill  . ferrous sulfate 325 (65 FE) MG tablet Take 1 tablet (325 mg total) by mouth 2 (two) times daily with a meal. (Patient not taking: Reported on 05/17/2015) 90 tablet 0  . ibuprofen (ADVIL,MOTRIN) 600 MG tablet Take 1 tablet (600 mg total) by mouth every 6 (six) hours. (Patient not taking: Reported on 09/09/2015) 90 tablet 0  . metroNIDAZOLE (FLAGYL) 500 MG tablet Take 1 tablet (500 mg total) by mouth 2 (two) times daily. (Patient not taking: Reported on 09/09/2015) 14 tablet 0  . Prenatal Vit-Fe Fumarate-FA (PRENATAL VITAMINS PLUS) 27-1 MG TABS Take 1 tablet by mouth daily. (Patient not taking: Reported on 05/17/2015) 90 tablet 3   Allergies  Allergen Reactions  . Peanuts [Peanut Oil] Shortness Of Breath, Itching, Swelling and Rash  . Amoxicillin Hives    Has patient had a PCN reaction causing immediate rash, facial/tongue/throat swelling, SOB or lightheadedness with hypotension: No Able  to take Cephalosporins.  Has patient had a PCN reaction causing severe rash involving mucus membranes or skin necrosis: No Has patient had a PCN reaction that required hospitalization Yes Has patient had a PCN reaction occurring within the last 10 years: No If all of the above answers are "NO", then may proceed with Cephalosporin use.  . Latex Hives and Itching    I have reviewed the past Medical Hx, Surgical Hx, Social Hx, Allergies and Medications.   Review of Systems  Constitutional: Negative for fever and chills.  Gastrointestinal: Positive for abdominal pain. Negative for nausea, vomiting, diarrhea and constipation.  Genitourinary: Positive for vaginal bleeding, vaginal discharge and menstrual problem. Negative for dysuria, urgency, frequency, hematuria, flank pain and vaginal pain.  Musculoskeletal: Negative for back pain.  Neurological: Negative for dizziness.    OBJECTIVE Patient Vitals for the past 24 hrs:  BP Temp Pulse Resp  09/09/15 1701 130/69 mmHg - (!) 58 -  09/09/15 1450 126/67 mmHg 98.6 F (37 C) (!) 57 20   Constitutional: Well-developed, well-nourished female in no acute distress.  Cardiovascular: Mild bradycardia. Regular rhythm Respiratory: normal rate and effort.  GI: Abd soft, mild suprapubic tenderness. No guarding or mass. Pos BS x 4 MS: Extremities nontender, no edema, normal ROM Neurologic: Alert and oriented x 4.  GU: Neg CVAT.  SPECULUM EXAM: NEFG, large amount of malodorous, yellowish-pink discharge. Cervix pink and smooth. No polyps. No frank bleeding.  BIMANUAL: cervix closed; uterus top-normal size, no adnexal tenderness or masses. No CMT.  LAB RESULTS Results for orders placed or performed during the hospital encounter of 09/09/15 (from the past 24 hour(s))  Urinalysis, Routine w reflex microscopic (not at [facility])     Status: Abnormal   Collection Time: 09/09/15  2:35 PM  Result Value Ref Range   Color, Urine YELLOW YELLOW   APPearance CLEAR  CLEAR   Specific Gravity, Urine 1.015 1.005 - 1.030   pH 7.0 5.0 - 8.0   Glucose, UA NEGATIVE NEGATIVE mg/dL   Hgb urine dipstick NEGATIVE NEGATIVE   Bilirubin Urine NEGATIVE NEGATIVE   Ketones, ur NEGATIVE NEGATIVE mg/dL   Protein, ur NEGATIVE NEGATIVE  mg/dL   Nitrite NEGATIVE NEGATIVE   Leukocytes, UA TRACE (A) NEGATIVE  Urine microscopic-add on     Status: Abnormal   Collection Time: 09/09/15  2:35 PM  Result Value Ref Range   Squamous Epithelial / LPF 6-30 (A) NONE SEEN   WBC, UA 0-5 0 - 5 WBC/hpf   RBC / HPF NONE SEEN 0 - 5 RBC/hpf   Bacteria, UA NONE SEEN NONE SEEN   Urine-Other MUCOUS PRESENT   Wet prep, genital     Status: Abnormal   Collection Time: 09/09/15  2:45 PM  Result Value Ref Range   Yeast Wet Prep HPF POC NONE SEEN NONE SEEN   Trich, Wet Prep NONE SEEN NONE SEEN   Clue Cells Wet Prep HPF POC PRESENT (A) NONE SEEN   WBC, Wet Prep HPF POC FEW (A) NONE SEEN   Sperm NONE SEEN   Pregnancy, urine POC     Status: Abnormal   Collection Time: 09/09/15  2:45 PM  Result Value Ref Range   Preg Test, Ur POSITIVE (A) NEGATIVE  hCG, quantitative, pregnancy     Status: Abnormal   Collection Time: 09/09/15  3:08 PM  Result Value Ref Range   hCG, Beta Chain, Quant, S [phone] (H) <5 mIU/mL  CBC     Status: Abnormal   Collection Time: 09/09/15  3:08 PM  Result Value Ref Range   WBC 10.2 4.0 - 10.5 K/uL   RBC 4.05 3.87 - 5.11 MIL/uL   Hemoglobin 12.2 12.0 - 15.0 g/dL   HCT 81.135.0 (L) 91.436.0 - 78.246.0 %   MCV 86.4 78.0 - 100.0 fL   MCH 30.1 26.0 - 34.0 pg   MCHC 34.9 30.0 - 36.0 g/dL   RDW 95.614.8 21.311.5 - 08.615.5 %   Platelets 195 150 - 400 K/uL    IMAGING [place] Ob Comp Less 14 Wks  09/09/2015  CLINICAL DATA:  Bleeding. Estimated gestational age. Assess for fetal heart tones. EXAM: OBSTETRIC <14 WK US AND TRANSVAGINAL OB US TECHNIQUE: Both transabdominal and transvaginal ultrasound examinations were performed for complete evaluation of the gestation as well as the maternal uterus, adnexal  regions, and pelvic cul-de-sac. Transvaginal technique was performed to assess early pregnancy. COMPARISON:  None. FINDINGS: Intrauterine gestational sac: Visualized Yolk sac:  Visualized Embryo:  Visualized visualized Cardiac Activity: Heart Rate: 124  bpm MSD:   mm    w   non  d CRL:  9.2  mm   6  w   6 d                  US EDC: 04/28/2016 Subchorionic hemorrhage:  None visualized. Maternal uterus/adnexae: No adnexal masses or free fluid. IMPRESSION: Six week 6 day intrauterine pregnancy. Fetal heart rate 124 beats per minute. No acute maternal findings. Electronically Signed   By: Charlett NoseKevin  Dover M.D.   On: 09/09/2015 15:29   [place] Ob Transvaginal  09/09/2015  CLINICAL DATA:  Bleeding. Estimated gestational age. Assess for fetal heart tones. EXAM: OBSTETRIC <14 WK US AND TRANSVAGINAL OB US TECHNIQUE: Both transabdominal and transvaginal ultrasound examinations were performed for complete evaluation of the gestation as well as the maternal uterus, adnexal regions, and pelvic cul-de-sac. Transvaginal technique was performed to assess early pregnancy. COMPARISON:  None. FINDINGS: Intrauterine gestational sac: Visualized Yolk sac:  Visualized Embryo:  Visualized visualized Cardiac Activity: Heart Rate: 124  bpm MSD:   mm    w   non  d CRL:  9.2  mm  6  w   6 d                  [place] EDC: 04/28/2016 Subchorionic hemorrhage:  None visualized. Maternal uterus/adnexae: No adnexal masses or free fluid. IMPRESSION: Six week 6 day intrauterine pregnancy. Fetal heart rate 124 beats per minute. No acute maternal findings. Electronically Signed   By: Charlett Nose M.D.   On: 09/09/2015 15:29    MAU COURSE CBC, Quant, ABO/Rh, ultrasound, wet prep and GC/chlamydia culture, UA.  Patient requesting treatment for STD exposure. Rocephin and azithromycin given. Patient has amoxicillin allergy, but has taken Rocephin and Ancef in the past without reaction. Observed for 40 minutes after Rocephin. No allergic reaction.  MDM - Pain  and bleeding in early pregnancy with normal intrauterine pregnancy and hemodynamically stable. - Pain likely due to BV. - No clear explanation for gush of blood patient described 2 days ago. Possible subchorionic hemorrhage that has now resolved.  ASSESSMENT 1. Vaginal bleeding during pregnancy, antepartum   2. Abdominal pain affecting pregnancy, antepartum   3. Normal intrauterine pregnancy on prenatal ultrasound, first trimester   4. Possible exposure to STD   5. BV (bacterial vaginosis)     PLAN Discharge home in stable condition. Bleeding precautions. GC/Chlamydia cultures pending. Always use condoms. Pelvic rest 1 week.     Follow-up Information    Follow up with [facility].   Specialty:  Obstetrics and Gynecology   Why:  Start prenatal care   Contact information:   [address] [place] [address] [phone]      Follow up with [facility] [place] MATERN[facility] ADMISS[facility].   Why:  As needed in emergencies   Contact information:   [address] [id number] mc [place] [address] [phone]       Medication List    STOP taking these medications        ferrous sulfate 325 (65 FE) MG tablet     ibuprofen 600 MG tablet  Commonly known as:  ADVIL,MOTRIN     PRENATAL VITAMINS PLUS 27-1 MG Tabs      TAKE these medications        ARIPiprazole 9.75 MG/1.3ML injection  Commonly known as:  ABILIFY  Inject 9.75 mg into the muscle once.     CONCEPT OB 130-92.4-1 MG Caps  Take 1 tablet by mouth daily.     metroNIDAZOLE 500 MG tablet  Commonly known as:  FLAGYL  Take 1 tablet (500 mg total) by mouth 2 (two) times daily.         Garrett, CNM 09/09/2015  4:52 PM  4

## 2015-09-10 LAB — HIV ANTIBODY (ROUTINE TESTING W REFLEX): HIV SCREEN 4TH GENERATION: NONREACTIVE

## 2015-09-11 LAB — GC/CHLAMYDIA PROBE AMP (~~LOC~~) NOT AT ARMC
Chlamydia: NEGATIVE
NEISSERIA GONORRHEA: NEGATIVE

## 2016-02-07 ENCOUNTER — Inpatient Hospital Stay (HOSPITAL_COMMUNITY)
Admission: AD | Admit: 2016-02-07 | Discharge: 2016-02-07 | Disposition: A | Discharge status: Home / Self Care | Payer: Medicaid Other | Source: Ambulatory Visit | Attending: Obstetrics & Gynecology | Admitting: Obstetrics & Gynecology

## 2016-02-07 ENCOUNTER — Encounter (HOSPITAL_COMMUNITY): Payer: Self-pay

## 2016-02-07 ENCOUNTER — Inpatient Hospital Stay (HOSPITAL_COMMUNITY): Payer: Medicaid Other

## 2016-02-07 DIAGNOSIS — O99333 Smoking (tobacco) complicating pregnancy, third trimester: Secondary | ICD-10-CM | POA: Diagnosis not present

## 2016-02-07 DIAGNOSIS — Z880 Allergy status to penicillin: Secondary | ICD-10-CM | POA: Insufficient documentation

## 2016-02-07 DIAGNOSIS — O09523 Supervision of elderly multigravida, third trimester: Secondary | ICD-10-CM

## 2016-02-07 DIAGNOSIS — R1084 Generalized abdominal pain: Secondary | ICD-10-CM

## 2016-02-07 DIAGNOSIS — N760 Acute vaginitis: Secondary | ICD-10-CM

## 2016-02-07 DIAGNOSIS — O368390 Maternal care for abnormalities of the fetal heart rate or rhythm, unspecified trimester, not applicable or unspecified: Secondary | ICD-10-CM

## 2016-02-07 DIAGNOSIS — B9689 Other specified bacterial agents as the cause of diseases classified elsewhere: Secondary | ICD-10-CM

## 2016-02-07 DIAGNOSIS — O4693 Antepartum hemorrhage, unspecified, third trimester: Secondary | ICD-10-CM | POA: Insufficient documentation

## 2016-02-07 DIAGNOSIS — Z3A28 28 weeks gestation of pregnancy: Secondary | ICD-10-CM | POA: Insufficient documentation

## 2016-02-07 DIAGNOSIS — R109 Unspecified abdominal pain: Secondary | ICD-10-CM | POA: Diagnosis present

## 2016-02-07 DIAGNOSIS — F17210 Nicotine dependence, cigarettes, uncomplicated: Secondary | ICD-10-CM | POA: Diagnosis not present

## 2016-02-07 LAB — URINALYSIS, ROUTINE W REFLEX MICROSCOPIC
Bilirubin Urine: NEGATIVE
GLUCOSE, UA: NEGATIVE mg/dL
Glucose, UA: 100 mg/dL — AB
HGB URINE DIPSTICK: NEGATIVE
Hgb urine dipstick: NEGATIVE
KETONES UR: 15 mg/dL — AB
Ketones, ur: NEGATIVE mg/dL
Leukocytes, UA: NEGATIVE
NITRITE: NEGATIVE
Nitrite: NEGATIVE
PROTEIN: NEGATIVE mg/dL
PROTEIN: NEGATIVE mg/dL
pH: 6 (ref 5.0–8.0)
pH: 7 (ref 5.0–8.0)

## 2016-02-07 LAB — DIFFERENTIAL
BAND NEUTROPHILS: 0 %
BASOS PCT: 0 %
BLASTS: 0 %
Basophils Absolute: 0 10*3/uL (ref 0.0–0.1)
EOS PCT: 0 %
Eosinophils Absolute: 0 10*3/uL (ref 0.0–0.7)
LYMPHS ABS: 4.7 10*3/uL — AB (ref 0.7–4.0)
Lymphocytes Relative: 33 %
METAMYELOCYTES PCT: 0 %
MYELOCYTES: 0 %
Monocytes Absolute: 0.8 10*3/uL (ref 0.1–1.0)
Monocytes Relative: 6 %
NEUTROS ABS: 8.6 10*3/uL — AB (ref 1.7–7.7)
NEUTROS PCT: 61 %
NRBC: 0 /100{WBCs}
OTHER: 0 %
PROMYELOCYTES ABS: 0 %

## 2016-02-07 LAB — CBC
HEMATOCRIT: 31.2 % — AB (ref 36.0–46.0)
HEMOGLOBIN: 11 g/dL — AB (ref 12.0–15.0)
MCH: 31.4 pg (ref 26.0–34.0)
MCHC: 35.3 g/dL (ref 30.0–36.0)
MCV: 89.1 fL (ref 78.0–100.0)
Platelets: 195 10*3/uL (ref 150–400)
RBC: 3.5 MIL/uL — AB (ref 3.87–5.11)
RDW: 14.5 % (ref 11.5–15.5)
WBC: 14.1 10*3/uL — ABNORMAL HIGH (ref 4.0–10.5)

## 2016-02-07 LAB — TYPE AND SCREEN
ABO/RH(D): A POS
Antibody Screen: NEGATIVE

## 2016-02-07 LAB — URINE MICROSCOPIC-ADD ON

## 2016-02-07 LAB — RAPID URINE DRUG SCREEN, HOSP PERFORMED
AMPHETAMINES: NOT DETECTED
BARBITURATES: NOT DETECTED
BENZODIAZEPINES: NOT DETECTED
Cocaine: NOT DETECTED
Opiates: NOT DETECTED
TETRAHYDROCANNABINOL: NOT DETECTED

## 2016-02-07 LAB — WET PREP, GENITAL
SPERM: NONE SEEN
Trich, Wet Prep: NONE SEEN
YEAST WET PREP: NONE SEEN

## 2016-02-07 LAB — HEPATITIS B SURFACE ANTIGEN: HEP B S AG: NEGATIVE

## 2016-02-07 MED ORDER — METRONIDAZOLE 500 MG PO TABS: 1000.0000 mg | ORAL_TABLET | Freq: Two times a day (BID) | ORAL | 0 refills | Status: AC

## 2016-02-07 MED ORDER — LACTATED RINGERS IV BOLUS (SEPSIS)
1000.0000 mL | Freq: Once | INTRAVENOUS | Status: AC
  Administered 2016-02-07: 1000 mL via INTRAVENOUS

## 2016-02-07 NOTE — Discharge Instructions (Signed)
Abdominal Pain During Pregnancy Belly (abdominal) pain is common during pregnancy. Most of the time, it is not a serious problem. Other times, it can be a sign that something is wrong with the pregnancy. Always tell your doctor if you have belly pain. HOME CARE Monitor your belly pain for any changes. The following actions may help you feel better:  Do not have sex (intercourse) or put anything in your vagina until you feel better.  Rest until your pain stops.  Drink clear fluids if you feel sick to your stomach (nauseous). Do not eat solid food until you feel better.  Only take medicine as told by your doctor.  Keep all doctor visits as told. GET HELP RIGHT AWAY IF:   You are bleeding, leaking fluid, or pieces of tissue come out of your vagina.  You have more pain or cramping.  You keep throwing up (vomiting).  You have pain when you pee (urinate) or have blood in your pee.  You have a fever.  You do not feel your baby moving as much.  You feel very weak or feel like passing out.  You have trouble breathing, with or without belly pain.  You have a very bad headache and belly pain.  You have fluid leaking from your vagina and belly pain.  You keep having watery poop (diarrhea).  Your belly pain does not go away after resting, or the pain gets worse. MAKE SURE YOU:   Understand these instructions.  Will watch your condition.  Will get help right away if you are not doing well or get worse.   This information is not intended to replace advice given to you by your health care provider. Make sure you discuss any questions you have with your health care provider.   Document Released: 03/13/2009 Document Revised: 11/25/2012 Document Reviewed: 10/22/2012 Elsevier Interactive Patient Education 2016 [employer] Second Trimester of Pregnancy The second trimester is from week 13 through week 28, months 4 through 6. The second trimester is often a time when you feel your  best. Your body has also adjusted to being pregnant, and you begin to feel better physically. Usually, morning sickness has lessened or quit completely, you may have more energy, and you may have an increase in appetite. The second trimester is also a time when the fetus is growing rapidly. At the end of the sixth month, the fetus is about 9 inches long and weighs about 1 pounds. You will likely begin to feel the baby move (quickening) between 18 and 20 weeks of the pregnancy. BODY CHANGES Your body goes through many changes during pregnancy. The changes vary from woman to woman.   Your weight will continue to increase. You will notice your lower abdomen bulging out.  You may begin to get stretch marks on your hips, abdomen, and breasts.  You may develop headaches that can be relieved by medicines approved by your health care provider.  You may urinate more often because the fetus is pressing on your bladder.  You may develop or continue to have heartburn as a result of your pregnancy.  You may develop constipation because certain hormones are causing the muscles that push waste through your intestines to slow down.  You may develop hemorrhoids or swollen, bulging veins (varicose veins).  You may have back pain because of the weight gain and pregnancy hormones relaxing your joints between the bones in your pelvis and as a result of a shift in weight and the muscles  that support your balance.  Your breasts will continue to grow and be tender.  Your gums may bleed and may be sensitive to brushing and flossing.  Dark spots or blotches (chloasma, mask of pregnancy) may develop on your face. This will likely fade after the baby is born.  A dark line from your belly button to the pubic area (linea nigra) may appear. This will likely fade after the baby is born.  You may have changes in your hair. These can include thickening of your hair, rapid growth, and changes in texture. Some women also  have hair loss during or after pregnancy, or hair that feels dry or thin. Your hair will most likely return to normal after your baby is born. WHAT TO EXPECT AT YOUR PRENATAL VISITS During a routine prenatal visit:  You will be weighed to make sure you and the fetus are growing normally.  Your blood pressure will be taken.  Your abdomen will be measured to track your baby's growth.  The fetal heartbeat will be listened to.  Any test results from the previous visit will be discussed. Your health care provider may ask you:  How you are feeling.  If you are feeling the baby move.  If you have had any abnormal symptoms, such as leaking fluid, bleeding, severe headaches, or abdominal cramping.  If you are using any tobacco products, including cigarettes, chewing tobacco, and electronic cigarettes.  If you have any questions. Other tests that may be performed during your second trimester include:  Blood tests that check for:  Low iron levels (anemia).  Gestational diabetes (between 24 and 28 weeks).  Rh antibodies.  Urine tests to check for infections, diabetes, or protein in the urine.  An ultrasound to confirm the proper growth and development of the baby.  An amniocentesis to check for possible genetic problems.  Fetal screens for spina bifida and Down syndrome.  HIV (human immunodeficiency virus) testing. Routine prenatal testing includes screening for HIV, unless you choose not to have this test. HOME CARE INSTRUCTIONS   Avoid all smoking, herbs, alcohol, and unprescribed drugs. These chemicals affect the formation and growth of the baby.  Do not use any tobacco products, including cigarettes, chewing tobacco, and electronic cigarettes. If you need help quitting, ask your health care provider. You may receive counseling support and other resources to help you quit.  Follow your health care provider's instructions regarding medicine use. There are medicines that are  either safe or unsafe to take during pregnancy.  Exercise only as directed by your health care provider. Experiencing uterine cramps is a good sign to stop exercising.  Continue to eat regular, healthy meals.  Wear a good support bra for breast tenderness.  Do not use hot tubs, steam rooms, or saunas.  Wear your seat belt at all times when driving.  Avoid raw meat, uncooked cheese, cat litter boxes, and soil used by cats. These carry germs that can cause birth defects in the baby.  Take your prenatal vitamins.  Take 1500-2000 mg of calcium daily starting at the 20th week of pregnancy until you deliver your baby.  Try taking a stool softener (if your health care provider approves) if you develop constipation. Eat more high-fiber foods, such as fresh vegetables or fruit and whole grains. Drink plenty of fluids to keep your urine clear or pale yellow.  Take warm sitz baths to soothe any pain or discomfort caused by hemorrhoids. Use hemorrhoid cream if your health care provider approves.  If you develop varicose veins, wear support hose. Elevate your feet for 15 minutes, 3-4 times a day. Limit salt in your diet.  Avoid heavy lifting, wear low heel shoes, and practice good posture.  Rest with your legs elevated if you have leg cramps or low back pain.  Visit your dentist if you have not gone yet during your pregnancy. Use a soft toothbrush to brush your teeth and be gentle when you floss.  A sexual relationship may be continued unless your health care provider directs you otherwise.  Continue to go to all your prenatal visits as directed by your health care provider. SEEK MEDICAL CARE IF:   You have dizziness.  You have mild pelvic cramps, pelvic pressure, or nagging pain in the abdominal area.  You have persistent nausea, vomiting, or diarrhea.  You have a bad smelling vaginal discharge.  You have pain with urination. SEEK IMMEDIATE MEDICAL CARE IF:   You have a  fever.  You are leaking fluid from your vagina.  You have spotting or bleeding from your vagina.  You have severe abdominal cramping or pain.  You have rapid weight gain or loss.  You have shortness of breath with chest pain.  You notice sudden or extreme swelling of your face, hands, ankles, feet, or legs.  You have not felt your baby move in over an hour.  You have severe headaches that do not go away with medicine.  You have vision changes.   This information is not intended to replace advice given to you by your health care provider. Make sure you discuss any questions you have with your health care provider.   Document Released: 03/19/2001 Document Revised: 04/15/2014 Document Reviewed: 05/26/2012 Elsevier Interactive Patient Education [employer]

## 2016-02-07 NOTE — MAU Note (Signed)
No prenatal care. Has some bleeding when she wiped, but not since and none on the pad.

## 2016-02-07 NOTE — MAU Provider Note (Signed)
Patient [name] is a 35 year old G14 [id number] here with complaints of abdominal pain and bleeding. The abdominal pain started at about 3 pm and she had some bleeding when she wiped, she said that the toilet bowl was full of blood. She denies painful intercourse or pain with urination. She has regular bowel movements. She denies vaginal discharge. She feels the baby moving.  History     CSN: [id number]  Arrival date and time: 02/07/16 1420   None     Chief Complaint  Patient presents with  . Vaginal Bleeding   Abdominal Pain  This is a new problem. The current episode started today. The onset quality is sudden. The problem occurs intermittently. The problem has been unchanged. The pain is located in the suprapubic region. The pain is severe. The quality of the pain is sharp. The abdominal pain does not radiate.  The patient states that the pain feels like a "pulling sensation". She rates it a 9/10.     Past Medical History:  Diagnosis Date  . Anemia   . Bipolar 1 disorder (HCC)   . Chlamydia   . Chronic abdominal pain   . Chronic nausea   . Deliberate self-cutting   . HSV-2 infection 2015  . Hypertension   . Infection    MRSA in 1995, negative since  . Schizophrenia (HCC)   . Seizures (HCC)    Not recently  . Sickle cell trait (HCC)   . Trichomonas infection     Past Surgical History:  Procedure Laterality Date  . DILATION AND CURETTAGE OF UTERUS    . HEMORROIDECTOMY  2010  . plastic surgery on face      Family History  Problem Relation Age of Onset  . Cancer Mother   . Heart failure Mother   . Hypertension Mother   . Stroke Mother   . Diabetes Mother   . Hypertension Maternal Grandmother   . Anesthesia problems Neg Hx     Social History  Substance Use Topics  . Smoking status: Current Every Day Smoker    Packs/day: 0.50    Years: 1.50    Types: Cigarettes  . Smokeless tobacco: Never Used  . Alcohol use No     Comment: hx drug use    Allergies:   Allergies  Allergen Reactions  . Peanut-Containing Drug Products Anaphylaxis, Itching and Rash  . Amoxicillin Hives and Other (See Comments)    Has patient had a PCN reaction causing immediate rash, facial/tongue/throat swelling, SOB or lightheadedness with hypotension: No Has patient had a PCN reaction causing severe rash involving mucus membranes or skin necrosis: No Has patient had a PCN reaction that required hospitalization No Has patient had a PCN reaction occurring within the last 10 years: No If all of the above answers are "NO", then may proceed with Cephalosporin use.  . Latex Hives and Itching    Prescriptions Prior to Admission  Medication Sig Dispense Refill Last Dose  . Prenat w/o A Vit-FeFum-FePo-FA (CONCEPT OB) 130-92.4-1 MG CAPS Take 1 capsule by mouth daily.   02/07/2016 at Unknown time    Review of Systems  Constitutional: Negative.   HENT: Negative.   Eyes: Negative.   Respiratory: Negative.   Cardiovascular: Negative.   Gastrointestinal: Positive for abdominal pain.  Genitourinary: Negative.   Musculoskeletal: Negative.   Skin: Negative.   Neurological: Negative.   Endo/Heme/Allergies: Negative.   Psychiatric/Behavioral: Negative.    Physical Exam   Blood pressure 121/65, pulse 89, temperature  98.1 F (36.7 C), temperature source Oral, resp. rate 16, height 5\' 3"  (1.6 m), weight 156 lb 6.4 oz (70.9 kg), last menstrual period 08/21/2015, not currently breastfeeding.  Physical Exam  Constitutional: She is oriented to person, place, and time. She appears well-developed.  Neck: Normal range of motion.  Respiratory: Effort normal.  GI: Soft. Bowel sounds are normal.  Genitourinary: Vagina normal.  Genitourinary Comments: Labia and vagina are pink and moist, no lesions noted. Moderate white odorless discharge in posterior fornix. No bleeding. Cervix is nonerythematous with negative CMT. Closed, thick and posterior.   Musculoskeletal: Normal range of motion.   Neurological: She is oriented to person, place, and time.  Skin: Skin is warm and dry.  Psychiatric: She has a normal mood and affect.    MAU Course  Procedures Bedside US MDM After difficulty tracing baby and NRFHR a limited bedside US was ordered stat. Intrauterine resuscitation efforts began and Dr. Debroah LoopArnold made aware of patient's tracing. Patient was long, closed and thick.  -Prenatal labs -UDS -negative -Urine culture for GBS -Wet prep -GC cultures -UA shows ketones and specific gravity of 1.030 and glucose 100.   Assessment and Plan  Patient received two liters of fluid over the course of her MAU visit. MFM US shows posterior placenta, adequate amniotic fluid, FHR of 154 and cervical length of 4.71 cm. After consultation with Dr. Despina HiddenEure, patient is stable for discharge and needs appointment with fetal cardiologist for possible fetal arrthymia. Made appointment for patient tomorrow at 8:40 am in the [facility]. The patient's mother will drive her to the appointment. Both she and her mother understand the importance of keeping this appointment and the necessity of the fetal echocardiogram. Prescription given for flagyl.   Charlesetta GaribaldiKathryn Lorraine Dayveon Halley CNM 02/07/2016, 5:15 PM

## 2016-02-07 NOTE — MAU Note (Signed)
Patient states that her back is in constant pain and that she can not stay still for me to consistently monitor baby. Fetal HR in the 140s.

## 2016-02-08 ENCOUNTER — Encounter: Payer: Self-pay | Admitting: Advanced Practice Midwife

## 2016-02-08 ENCOUNTER — Telehealth: Payer: Self-pay | Admitting: *Deleted

## 2016-02-08 ENCOUNTER — Ambulatory Visit (INDEPENDENT_AMBULATORY_CARE_PROVIDER_SITE_OTHER): Payer: Medicaid Other | Admitting: Advanced Practice Midwife

## 2016-02-08 VITALS — BP 122/76 | HR 89 | Wt 161.6 lb

## 2016-02-08 DIAGNOSIS — Z8679 Personal history of other diseases of the circulatory system: Secondary | ICD-10-CM

## 2016-02-08 DIAGNOSIS — Z23 Encounter for immunization: Secondary | ICD-10-CM

## 2016-02-08 DIAGNOSIS — O0993 Supervision of high risk pregnancy, unspecified, third trimester: Secondary | ICD-10-CM

## 2016-02-08 DIAGNOSIS — Z363 Encounter for antenatal screening for malformations: Secondary | ICD-10-CM | POA: Diagnosis not present

## 2016-02-08 DIAGNOSIS — O368390 Maternal care for abnormalities of the fetal heart rate or rhythm, unspecified trimester, not applicable or unspecified: Secondary | ICD-10-CM | POA: Diagnosis not present

## 2016-02-08 DIAGNOSIS — O0933 Supervision of pregnancy with insufficient antenatal care, third trimester: Secondary | ICD-10-CM

## 2016-02-08 DIAGNOSIS — Z3A29 29 weeks gestation of pregnancy: Secondary | ICD-10-CM

## 2016-02-08 DIAGNOSIS — O0930 Supervision of pregnancy with insufficient antenatal care, unspecified trimester: Secondary | ICD-10-CM | POA: Insufficient documentation

## 2016-02-08 LAB — COMPREHENSIVE METABOLIC PANEL
ALBUMIN: 3.2 g/dL — AB (ref 3.6–5.1)
ALT: 9 U/L (ref 6–29)
AST: 15 U/L (ref 10–30)
Alkaline Phosphatase: 60 U/L (ref 33–115)
BUN: 4 mg/dL — ABNORMAL LOW (ref 7–25)
CALCIUM: 8.6 mg/dL (ref 8.6–10.2)
CO2: 20 mmol/L (ref 20–31)
Chloride: 108 mmol/L (ref 98–110)
Creat: 0.47 mg/dL — ABNORMAL LOW (ref 0.50–1.10)
Glucose, Bld: 75 mg/dL (ref 65–99)
POTASSIUM: 3.4 mmol/L — AB (ref 3.5–5.3)
Sodium: 138 mmol/L (ref 135–146)
TOTAL PROTEIN: 5.6 g/dL — AB (ref 6.1–8.1)
Total Bilirubin: 0.6 mg/dL (ref 0.2–1.2)

## 2016-02-08 LAB — GC/CHLAMYDIA PROBE AMP (~~LOC~~) NOT AT ARMC
Chlamydia: NEGATIVE
Neisseria Gonorrhea: NEGATIVE

## 2016-02-08 LAB — POCT URINALYSIS DIP (DEVICE)
GLUCOSE, UA: NEGATIVE mg/dL
Hgb urine dipstick: NEGATIVE
Ketones, ur: NEGATIVE mg/dL
NITRITE: NEGATIVE
PROTEIN: 30 mg/dL — AB
Specific Gravity, Urine: 1.02 (ref 1.005–1.030)
pH: 7 (ref 5.0–8.0)

## 2016-02-08 LAB — PROTEIN / CREATININE RATIO, URINE
CREATININE, URINE: 217 mg/dL (ref 20–320)
PROTEIN CREATININE RATIO: 106 mg/g{creat} (ref 21–161)
TOTAL PROTEIN, URINE: 23 mg/dL (ref 5–24)

## 2016-02-08 LAB — HEMOGLOBIN A1C: Hgb A1c MFr Bld: <4 % (ref <5.7)

## 2016-02-08 LAB — HIV ANTIBODY (ROUTINE TESTING W REFLEX): HIV Screen 4th Generation wRfx: NONREACTIVE

## 2016-02-08 LAB — RPR: RPR Ser Ql: NONREACTIVE

## 2016-02-08 LAB — RUBELLA SCREEN: Rubella: 1.21 index (ref >0.99)

## 2016-02-08 MED ORDER — GLUCOSE BLOOD VI STRP: ORAL_STRIP | 12 refills | Status: DC

## 2016-02-08 MED ORDER — ASPIRIN EC 81 MG PO TBEC: 81.0000 mg | DELAYED_RELEASE_TABLET | Freq: Every day | ORAL | 2 refills | Status: DC

## 2016-02-08 MED ORDER — ACCU-CHEK AVIVA PLUS W/DEVICE KIT: 1.0000 | PACK | Freq: Four times a day (QID) | 0 refills | Status: DC

## 2016-02-08 NOTE — Progress Notes (Signed)
  Subjective:    [name] is being seen today for her first obstetrical visit.  This is not a planned pregnancy. She is at 39w4dgestation by 6 week [place] Her obstetrical history is significant for advanced maternal age, non-compliance and CHTN, late to care, [place] trait, schizophrenia, fetal arrhythmia. Patient does not intend to breast feed. Pregnancy history fully reviewed.  Seen in MAu yesterdau for bleeding, but no blood on exam. Limited [place] no previa or evidence of abruption. No further reported bleeding.   Patient reports no complaints.  Review of Systems:   Review of Systems  Objective:     BP 122/76   Pulse 89   Wt 161 lb 9.6 oz (73.3 kg)   LMP 08/21/2015   BMI 28.63 kg/m  Physical Exam  Exam    Assessment:    Pregnancy: [id number] Active Problem List   Diagnosis Date Noted  . Limited prenatal care 02/08/2016  . Fetal arrhythmia affecting pregnancy, antepartum 02/08/2016  . Tobacco abuse 12/19/2014  . Bipolar disorder ([place] 12/19/2014  . History of marijuana use 12/19/2014  . History of hypertension 12/19/2014  . Sickle cell trait ([place] 12/19/2014  . Supervision of high-risk pregnancy 12/19/2014     1. Limited prenatal care in third trimester  - [place]FM OB DETAIL +14 WK; Future - Pain Mgmt, Profile 6 Conf w/o mM, U - Culture, OB Urine  2. Supervision of high risk pregnancy in third trimester  - [place]FM OB DETAIL +14 WK; Future - Pain Mgmt, Profile 6 Conf w/o mM, U - Culture, OB Urine - Hemoglobin A1c  3. History of hypertension  - [place]oreaMFM OB DETAIL +14 WK; Future - Pain Mgmt, Profile 6 Conf w/o mM, U - Culture, OB Urine - Comp Met (CMET) - Protein / Creatinine Ratio, Urine - aspirin EC 81 MG tablet; Take 1 tablet (81 mg total) by mouth daily. Take after 12 weeks for prevention of preeclampssia later in pregnancy  Dispense: 300 tablet; Refill: 2  4. Fetal arrhythmia affecting pregnancy, antepartum  - [place]etal Echocardiography;  Future - [place]MFM OB DETAIL +14 WK; Future - Pain Mgmt, Profile 6 Conf w/o mM, U  5. [redacted] weeks gestation of pregnancy  - [place]FM OB DETAIL +14 WK; Future - Pain Mgmt, Profile 6 Conf w/o mM, U  6. Encounter for antenatal screening for malformation using ultrasound  - UKoreaMFM OB DETAIL +14 WK; Future - Pain Mgmt, Profile 6 Conf w/o mM, U    Plan:     Initial labs reviewed. Prenatal vitamins. Problem list reviewed and updated. AFP3 discussed: too late. Role of ultrasound in pregnancy discussed; fetal survey: ordered. Amniocentesis discussed: not indicated. Follow up in 2 weeks. [place]

## 2016-02-08 NOTE — Addendum Note (Signed)
Addended by: Garret ReddishBARNES, Shelvy Heckert M on: 02/08/2016 10:36 AM   Modules accepted: Orders

## 2016-02-08 NOTE — Addendum Note (Signed)
Addended by: Dorathy KinsmanSMITH, Alandis Bluemel on: 02/08/2016 11:23 AM   Modules accepted: Orders

## 2016-02-08 NOTE — Telephone Encounter (Signed)
Scheduled appointment for fetal echo @ [facility], Nov [facility] @ 9am. Called patient and notified her of her appointment, gave her address and phone # and description of the building. Patient voiced understanding.

## 2016-02-08 NOTE — Patient Instructions (Addendum)
Diabetes test 18 Brach's jelly beans or 28 Starburst jelly beans  Preterm Labor Information Preterm labor is when labor starts at less than 37 weeks of pregnancy. The normal length of a pregnancy is 39 to 41 weeks. CAUSES Often, there is no identifiable underlying cause as to why a woman goes into preterm labor. One of the most common known causes of preterm labor is infection. Infections of the uterus, cervix, vagina, amniotic sac, bladder, kidney, or even the lungs (pneumonia) can cause labor to start. Other suspected causes of preterm labor include:   Urogenital infections, such as yeast infections and bacterial vaginosis.   Uterine abnormalities (uterine shape, uterine septum, fibroids, or bleeding from the placenta).   A cervix that has been operated on (it may fail to stay closed).   Malformations in the fetus.   Multiple gestations (twins, triplets, and so on).   Breakage of the amniotic sac.  RISK FACTORS  Having a previous history of preterm labor.   Having premature rupture of membranes (PROM).   Having a placenta that covers the opening of the cervix (placenta previa).   Having a placenta that separates from the uterus (placental abruption).   Having a cervix that is too weak to hold the fetus in the uterus (incompetent cervix).   Having too much fluid in the amniotic sac (polyhydramnios).   Taking illegal drugs or smoking while pregnant.   Not gaining enough weight while pregnant.   Being younger than 3418 and older than 36 years old.   Having a low socioeconomic status.   Being African American. SYMPTOMS Signs and symptoms of preterm labor include:   Menstrual-like cramps, abdominal pain, or back pain.  Uterine contractions that are regular, as frequent as six in an hour, regardless of their intensity (may be mild or painful).  Contractions that start on the top of the uterus and spread down to the lower abdomen and back.   A sense of  increased pelvic pressure.   A watery or bloody mucus discharge that comes from the vagina.  TREATMENT Depending on the length of the pregnancy and other circumstances, your health care provider may suggest bed rest. If necessary, there are medicines that can be given to stop contractions and to mature the fetal lungs. If labor happens before 34 weeks of pregnancy, a prolonged hospital stay may be recommended. Treatment depends on the condition of both you and the fetus.  WHAT SHOULD YOU DO IF YOU THINK YOU ARE IN PRETERM LABOR? Call your health care provider right away. You will need to go to the hospital to get checked immediately. HOW CAN YOU PREVENT PRETERM LABOR IN FUTURE PREGNANCIES? You should:   Stop smoking if you smoke.  Maintain healthy weight gain and avoid chemicals and drugs that are not necessary.  Be watchful for any type of infection.  Inform your health care provider if you have a known history of preterm labor.   This information is not intended to replace advice given to you by your health care provider. Make sure you discuss any questions you have with your health care provider.   Document Released: 06/15/2003 Document Revised: 11/25/2012 Document Reviewed: 04/27/2012 Elsevier Interactive Patient Education [employer]2016 Elsevier Inc.

## 2016-02-08 NOTE — Progress Notes (Signed)
Urine: small leukocytes

## 2016-02-08 NOTE — Progress Notes (Signed)
Tdap and flu today Refuses 2 hr glucose test

## 2016-02-09 LAB — PAIN MGMT, PROFILE 6 CONF W/O MM, U
6 Acetylmorphine: NEGATIVE ng/mL (ref <10)
ALCOHOL METABOLITES: NEGATIVE ng/mL (ref <500)
AMPHETAMINES: NEGATIVE ng/mL (ref <500)
BARBITURATES: NEGATIVE ng/mL (ref <300)
BENZODIAZEPINES: NEGATIVE ng/mL (ref <100)
CREATININE: 190.4 mg/dL (ref >=20.0)
Cocaine Metabolite: NEGATIVE ng/mL (ref <150)
METHADONE METABOLITE: NEGATIVE ng/mL (ref <100)
Marijuana Metabolite: NEGATIVE ng/mL (ref <20)
OPIATES: NEGATIVE ng/mL (ref <100)
OXIDANT: NEGATIVE ug/mL (ref <200)
OXYCODONE: NEGATIVE ng/mL (ref <100)
PH: 7.47 (ref 4.5–9.0)
PHENCYCLIDINE: NEGATIVE ng/mL (ref <25)
Please note:: 0

## 2016-02-09 LAB — CULTURE, OB URINE

## 2016-02-10 ENCOUNTER — Encounter: Payer: Self-pay | Admitting: Advanced Practice Midwife

## 2016-02-14 ENCOUNTER — Ambulatory Visit (HOSPITAL_COMMUNITY)
Admission: RE | Admit: 2016-02-14 | Discharge: 2016-02-14 | Disposition: A | Discharge status: Home / Self Care | Payer: Medicaid Other | Source: Ambulatory Visit | Attending: Advanced Practice Midwife | Admitting: Advanced Practice Midwife

## 2016-02-14 ENCOUNTER — Encounter (HOSPITAL_COMMUNITY): Payer: Self-pay

## 2016-02-14 ENCOUNTER — Other Ambulatory Visit (HOSPITAL_COMMUNITY): Payer: Self-pay | Admitting: *Deleted

## 2016-02-14 DIAGNOSIS — O0933 Supervision of pregnancy with insufficient antenatal care, third trimester: Secondary | ICD-10-CM | POA: Diagnosis not present

## 2016-02-14 DIAGNOSIS — Z363 Encounter for antenatal screening for malformations: Secondary | ICD-10-CM | POA: Insufficient documentation

## 2016-02-14 DIAGNOSIS — O99333 Smoking (tobacco) complicating pregnancy, third trimester: Secondary | ICD-10-CM | POA: Diagnosis not present

## 2016-02-14 DIAGNOSIS — O368390 Maternal care for abnormalities of the fetal heart rate or rhythm, unspecified trimester, not applicable or unspecified: Secondary | ICD-10-CM

## 2016-02-14 DIAGNOSIS — O10013 Pre-existing essential hypertension complicating pregnancy, third trimester: Secondary | ICD-10-CM | POA: Diagnosis not present

## 2016-02-14 DIAGNOSIS — Z862 Personal history of diseases of the blood and blood-forming organs and certain disorders involving the immune mechanism: Secondary | ICD-10-CM | POA: Diagnosis not present

## 2016-02-14 DIAGNOSIS — Z3A29 29 weeks gestation of pregnancy: Secondary | ICD-10-CM | POA: Insufficient documentation

## 2016-02-14 DIAGNOSIS — Z8679 Personal history of other diseases of the circulatory system: Secondary | ICD-10-CM

## 2016-02-14 DIAGNOSIS — O09523 Supervision of elderly multigravida, third trimester: Secondary | ICD-10-CM | POA: Insufficient documentation

## 2016-02-14 DIAGNOSIS — O0993 Supervision of high risk pregnancy, unspecified, third trimester: Secondary | ICD-10-CM

## 2016-02-22 ENCOUNTER — Encounter: Payer: Medicaid Other | Admitting: Obstetrics and Gynecology

## 2016-03-13 ENCOUNTER — Ambulatory Visit (HOSPITAL_COMMUNITY)
Admission: RE | Admit: 2016-03-13 | Discharge: 2016-03-13 | Disposition: A | Discharge status: Home / Self Care | Payer: Medicaid Other | Source: Ambulatory Visit | Attending: Advanced Practice Midwife | Admitting: Advanced Practice Midwife

## 2016-03-13 ENCOUNTER — Encounter (HOSPITAL_COMMUNITY): Payer: Self-pay

## 2016-03-27 ENCOUNTER — Ambulatory Visit (HOSPITAL_COMMUNITY)
Admission: RE | Admit: 2016-03-27 | Discharge: 2016-03-27 | Disposition: A | Discharge status: Home / Self Care | Payer: Medicaid Other | Source: Ambulatory Visit | Attending: Advanced Practice Midwife | Admitting: Advanced Practice Midwife

## 2016-03-27 ENCOUNTER — Encounter (HOSPITAL_COMMUNITY): Payer: Self-pay

## 2016-03-27 ENCOUNTER — Other Ambulatory Visit (HOSPITAL_COMMUNITY): Payer: Self-pay | Admitting: Obstetrics and Gynecology

## 2016-03-27 DIAGNOSIS — O09523 Supervision of elderly multigravida, third trimester: Secondary | ICD-10-CM | POA: Diagnosis not present

## 2016-03-27 DIAGNOSIS — O163 Unspecified maternal hypertension, third trimester: Secondary | ICD-10-CM

## 2016-03-27 DIAGNOSIS — Z3A35 35 weeks gestation of pregnancy: Secondary | ICD-10-CM | POA: Insufficient documentation

## 2016-03-27 DIAGNOSIS — O99333 Smoking (tobacco) complicating pregnancy, third trimester: Secondary | ICD-10-CM | POA: Diagnosis not present

## 2016-03-27 DIAGNOSIS — Z862 Personal history of diseases of the blood and blood-forming organs and certain disorders involving the immune mechanism: Secondary | ICD-10-CM

## 2016-03-27 DIAGNOSIS — O0933 Supervision of pregnancy with insufficient antenatal care, third trimester: Secondary | ICD-10-CM | POA: Insufficient documentation

## 2016-03-27 DIAGNOSIS — O10013 Pre-existing essential hypertension complicating pregnancy, third trimester: Secondary | ICD-10-CM | POA: Diagnosis not present

## 2016-03-27 DIAGNOSIS — Z3689 Encounter for other specified antenatal screening: Secondary | ICD-10-CM

## 2016-04-04 IMAGING — US US OB TRANSVAGINAL
1 series · 14 of 28 positions shown · non-contrast
Comparison: None.

CLINICAL DATA: Vaginal discharge and bleeding. Unknown last
menstrual period.

EXAM:
OBSTETRIC <14 WK US AND TRANSVAGINAL OB US
TECHNIQUE: Both transabdominal and transvaginal ultrasound examinations were
performed for complete evaluation of the gestation as well as the
maternal uterus, adnexal regions, and pelvic cul-de-sac.
Transvaginal technique was performed to assess early pregnancy.

[Series 1: us ob transvaginal · 0.12mm/px · 14 of 34 slices shown]
[im 2/34]
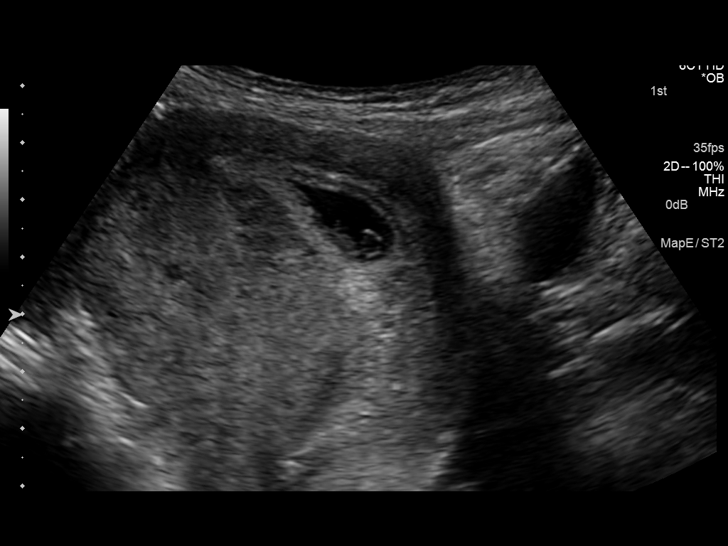
[im 4/34]
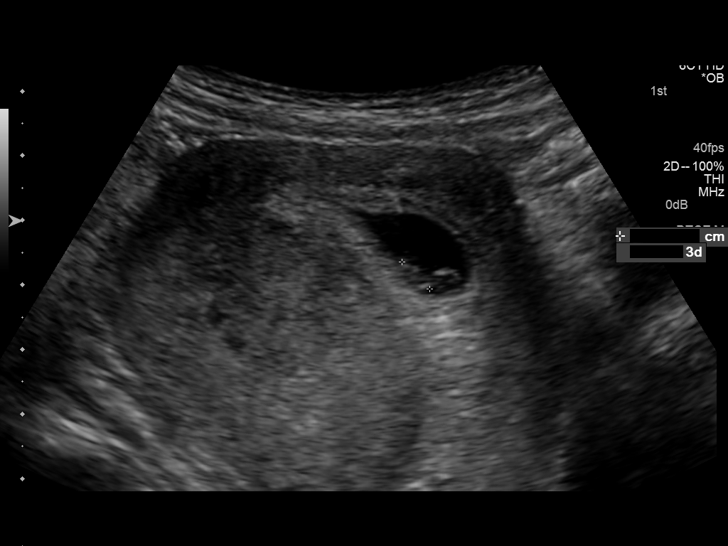
[im 7/34]
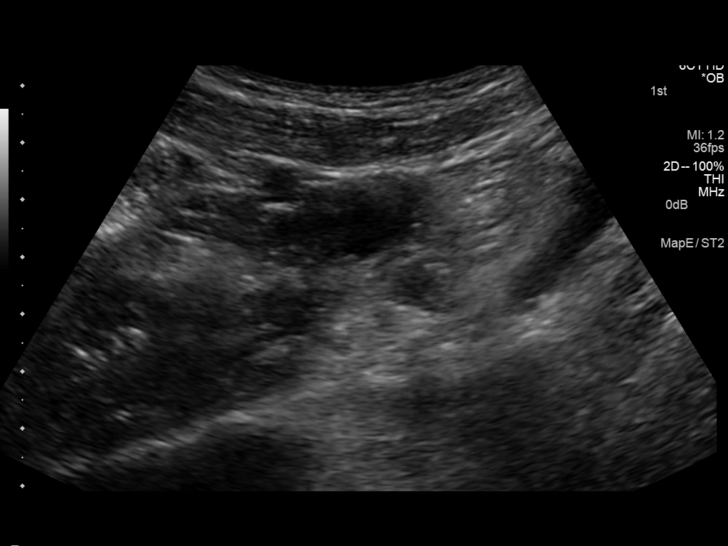
[im 9/34]
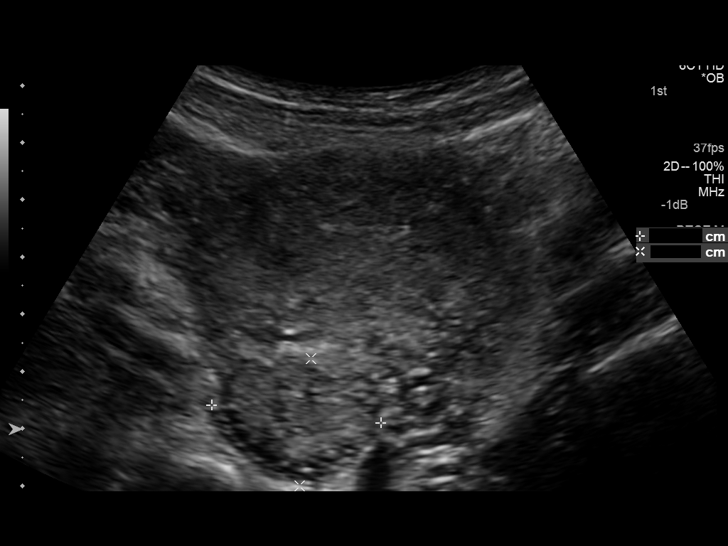
[im 12/34]
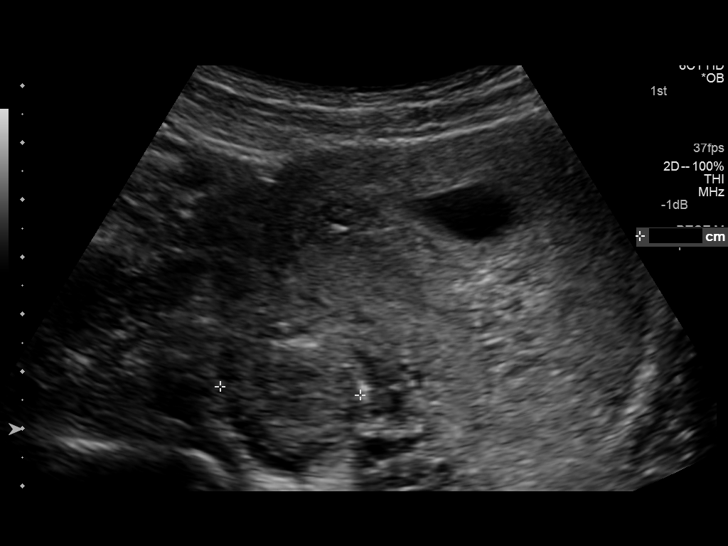
[im 14/34]
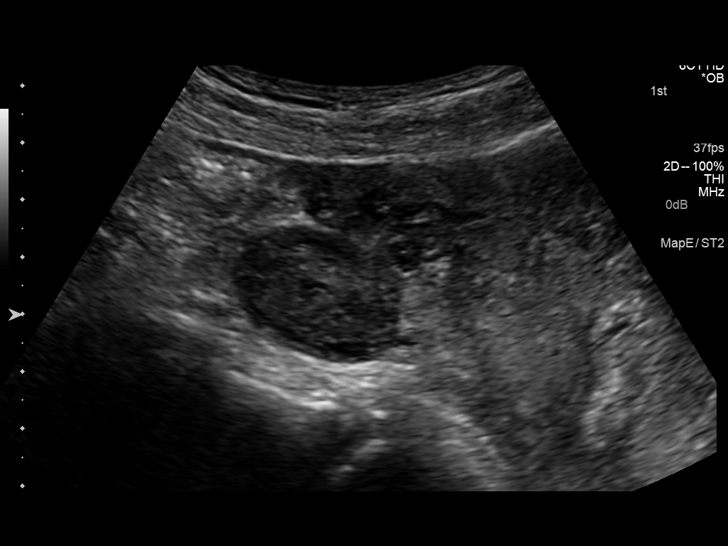
[im 16/34]
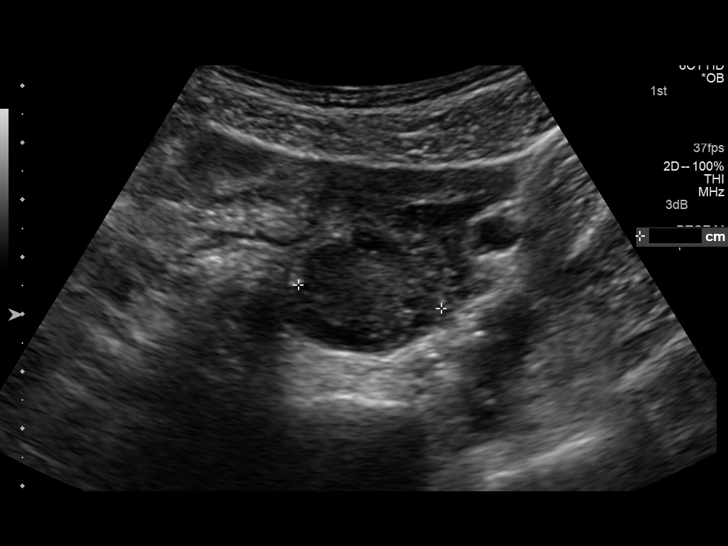
[im 19/34]
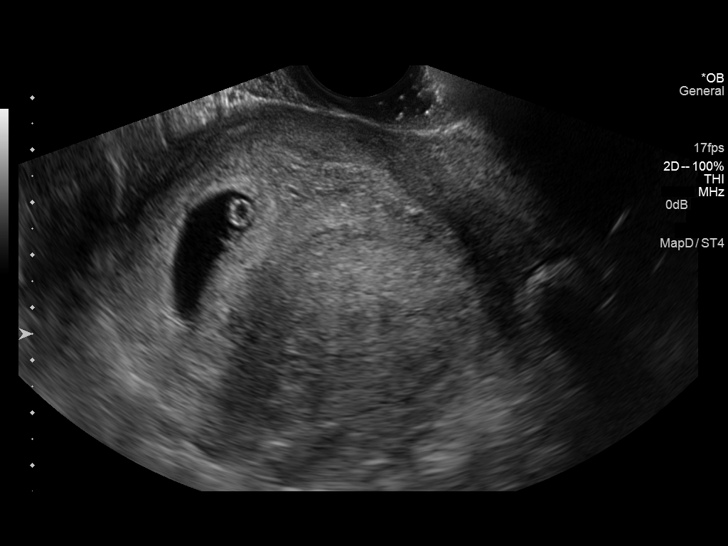
[im 21/34]
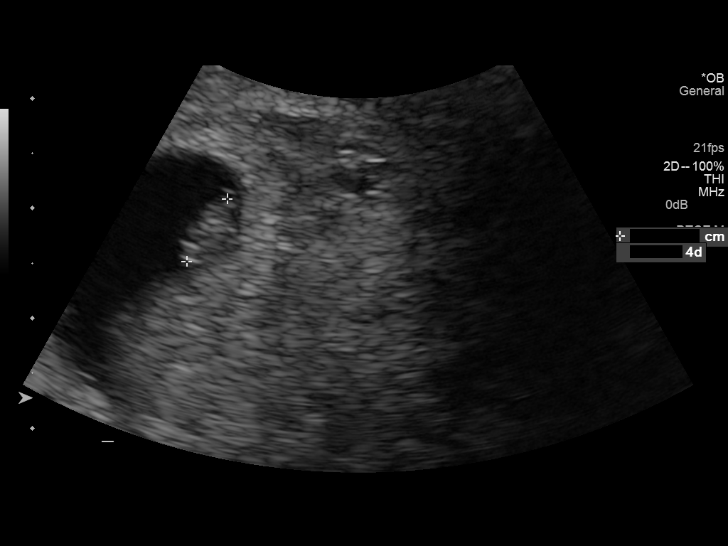
[im 24/34]
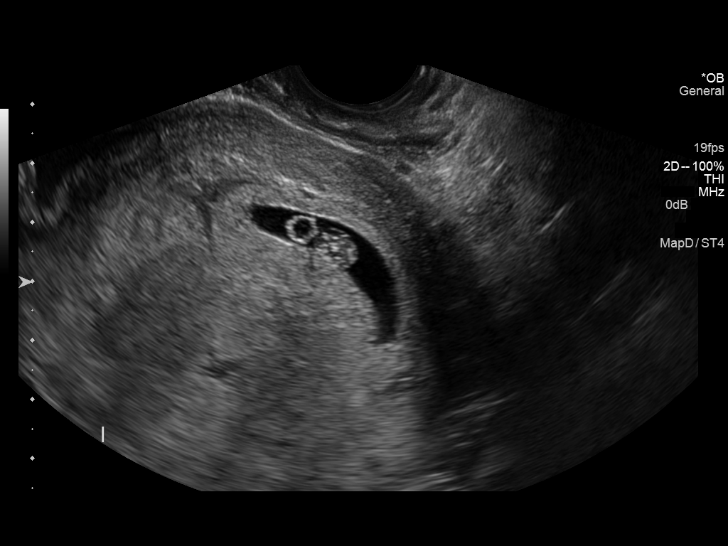
[im 26/34]
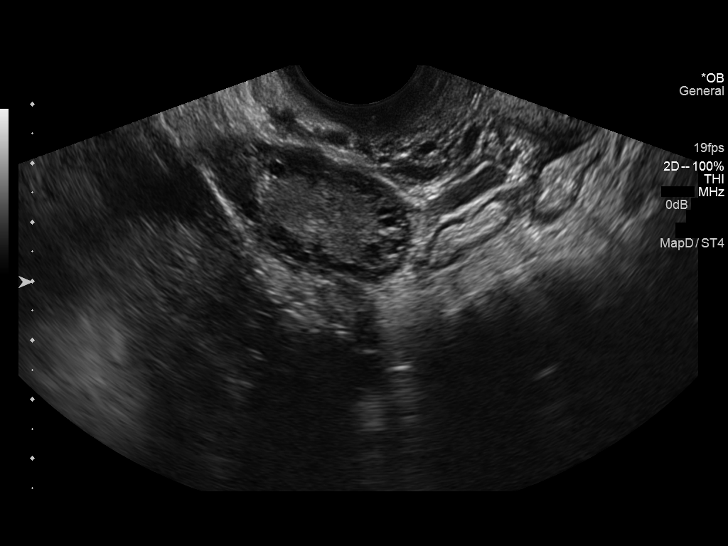
[im 29/34]
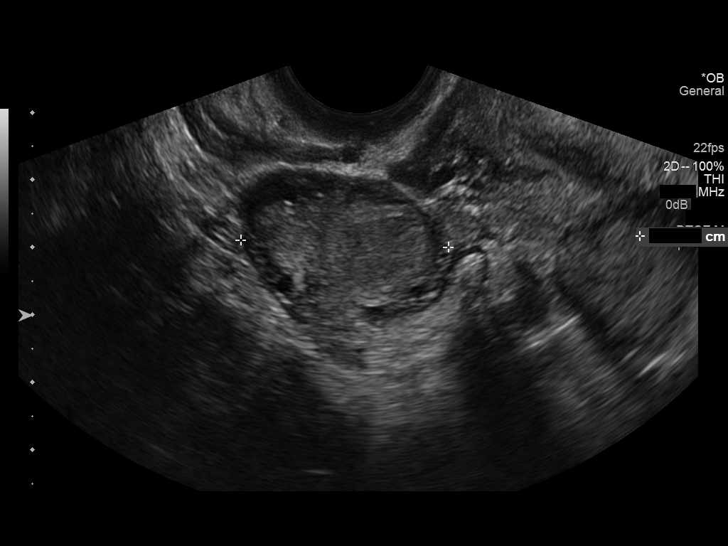
[im 31/34]
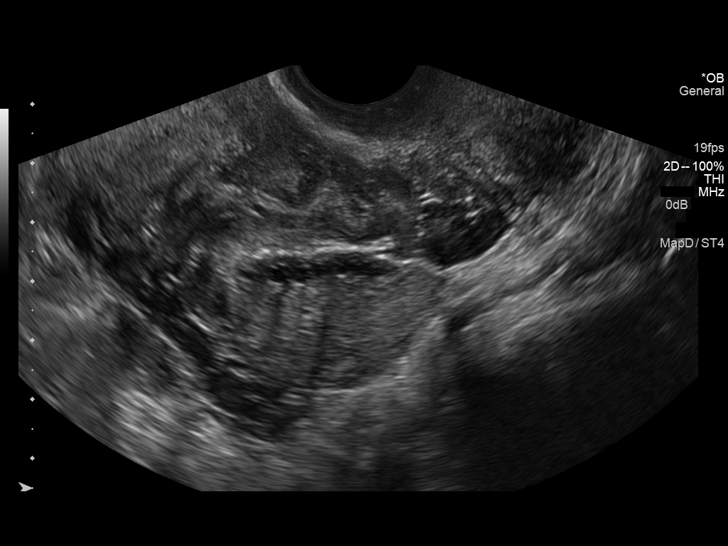
[im 34/34]
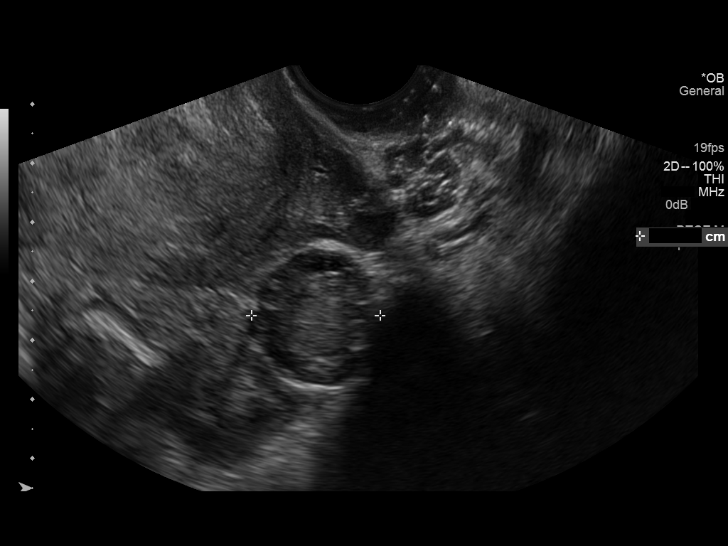

[14 of 28 positions shown; findings below may reference images not displayed]

FINDINGS: Intrauterine gestational sac: Visualized/normal in shape.

Yolk sac:  Present

Embryo:  Present

Cardiac Activity: Present

Heart Rate: 121  bpm

CRL:  0.62  cm   6 w   3 d                  US EDC: April 13, 2015

Maternal uterus/adnexae: The bilateral ovaries are normal. There is
no free fluid.
IMPRESSION: Single live intrauterine gestation measuring to 6 weeks and 3 days
without complications.

## 2016-04-08 NOTE — L&D Delivery Note (Signed)
  Hailey BirchwoodHilliard, [name] [[id number]][[id number]]  Delivery Note Progressed to complete dilation with urge to push.   Pushed only a few times to delivery.   At 5:44 PM a viable and healthy female was delivered via Vaginal, Spontaneous (Presentation: LOA with full arc rotation).  APGAR: 7, 8; weight 5 lb 1.3 oz (2305 g).   Placenta status: Undelivered due to twin delivery. .  Cord: Cord blood obtained and cord clamped with plastic clamp left in place to mark that cord as "A".   with the following complications: .Port wine fluid noted at delivery.   Cord pH: pending  Anesthesia:  epidural Episiotomy: None Lacerations:  none Suture Repair: none Est. Blood Loss (mL):  minimal  Awaiting second twin.  Vertex presentation but above symphysis.  Bulging membranes not ruptured.  Cervix now 8cm/80%.  Per Dr Alysia PennaErvin, recommends waiting for full dilation.  Pitocin started due to hypotonic uterine dysfunction.  Hailey BourgeoisMarie Crane 03/21/2017, 8:12 AM     Hailey HildingHilliard, [name] [[id number]][[id number]]  Delivery Note At  a viable and healthy baby was delivered via  Cesarean by Dr Pascal LuxPickens  [name] 03/21/2017, 8:12 AM

## 2016-04-08 NOTE — L&D Delivery Note (Signed)
Delivery Note TSVD of viable female infant at 4:59 Pm without problems Weight as recorded Apgars 9/9 Cord clamped and cut after 1 minute delay. Infant to mother's abd. 3 vessel cord, intact placenta by gentle traction. No lacerations or tears noted. EBL 300 cc All counts correct Mother and infant recovering in room together  Hermina StaggersMichael L Anaclara Acklin 04/18/2016, 5:18 PM

## 2016-04-18 ENCOUNTER — Inpatient Hospital Stay (HOSPITAL_COMMUNITY): Payer: Medicaid Other | Admitting: Anesthesiology

## 2016-04-18 ENCOUNTER — Inpatient Hospital Stay (HOSPITAL_COMMUNITY)
Admission: AD | Admit: 2016-04-18 | Discharge: 2016-04-20 | DRG: 774 | Disposition: A | Discharge status: Home / Self Care | Payer: Medicaid Other | Source: Ambulatory Visit | Attending: Obstetrics and Gynecology | Admitting: Obstetrics and Gynecology

## 2016-04-18 ENCOUNTER — Encounter (HOSPITAL_COMMUNITY): Payer: Self-pay | Admitting: *Deleted

## 2016-04-18 DIAGNOSIS — O99344 Other mental disorders complicating childbirth: Secondary | ICD-10-CM | POA: Diagnosis present

## 2016-04-18 DIAGNOSIS — O0993 Supervision of high risk pregnancy, unspecified, third trimester: Secondary | ICD-10-CM

## 2016-04-18 DIAGNOSIS — F17210 Nicotine dependence, cigarettes, uncomplicated: Secondary | ICD-10-CM | POA: Diagnosis present

## 2016-04-18 DIAGNOSIS — Z3A38 38 weeks gestation of pregnancy: Secondary | ICD-10-CM

## 2016-04-18 DIAGNOSIS — O368390 Maternal care for abnormalities of the fetal heart rate or rhythm, unspecified trimester, not applicable or unspecified: Secondary | ICD-10-CM

## 2016-04-18 DIAGNOSIS — Z823 Family history of stroke: Secondary | ICD-10-CM | POA: Diagnosis not present

## 2016-04-18 DIAGNOSIS — O1002 Pre-existing essential hypertension complicating childbirth: Secondary | ICD-10-CM | POA: Diagnosis present

## 2016-04-18 DIAGNOSIS — D573 Sickle-cell trait: Secondary | ICD-10-CM | POA: Diagnosis present

## 2016-04-18 DIAGNOSIS — Z833 Family history of diabetes mellitus: Secondary | ICD-10-CM

## 2016-04-18 DIAGNOSIS — Z8249 Family history of ischemic heart disease and other diseases of the circulatory system: Secondary | ICD-10-CM | POA: Diagnosis not present

## 2016-04-18 DIAGNOSIS — F319 Bipolar disorder, unspecified: Secondary | ICD-10-CM | POA: Diagnosis present

## 2016-04-18 DIAGNOSIS — F209 Schizophrenia, unspecified: Secondary | ICD-10-CM | POA: Diagnosis present

## 2016-04-18 DIAGNOSIS — O9902 Anemia complicating childbirth: Secondary | ICD-10-CM | POA: Diagnosis present

## 2016-04-18 DIAGNOSIS — O99334 Smoking (tobacco) complicating childbirth: Secondary | ICD-10-CM | POA: Diagnosis present

## 2016-04-18 DIAGNOSIS — Z3493 Encounter for supervision of normal pregnancy, unspecified, third trimester: Secondary | ICD-10-CM | POA: Diagnosis present

## 2016-04-18 DIAGNOSIS — O10919 Unspecified pre-existing hypertension complicating pregnancy, unspecified trimester: Secondary | ICD-10-CM

## 2016-04-18 DIAGNOSIS — O0933 Supervision of pregnancy with insufficient antenatal care, third trimester: Secondary | ICD-10-CM

## 2016-04-18 LAB — CBC
HEMATOCRIT: 32.4 % — AB (ref 36.0–46.0)
HEMOGLOBIN: 11.3 g/dL — AB (ref 12.0–15.0)
MCH: 31.3 pg (ref 26.0–34.0)
MCHC: 34.9 g/dL (ref 30.0–36.0)
MCV: 89.8 fL (ref 78.0–100.0)
Platelets: 185 10*3/uL (ref 150–400)
RBC: 3.61 MIL/uL — AB (ref 3.87–5.11)
RDW: 15.7 % — ABNORMAL HIGH (ref 11.5–15.5)
WBC: 11.8 10*3/uL — ABNORMAL HIGH (ref 4.0–10.5)

## 2016-04-18 LAB — COMPREHENSIVE METABOLIC PANEL
ALT: 8 U/L — AB (ref 14–54)
ANION GAP: 11 (ref 5–15)
AST: 17 U/L (ref 15–41)
Albumin: 2.8 g/dL — ABNORMAL LOW (ref 3.5–5.0)
Alkaline Phosphatase: 100 U/L (ref 38–126)
BUN: 5 mg/dL — ABNORMAL LOW (ref 6–20)
CHLORIDE: 107 mmol/L (ref 101–111)
CO2: 16 mmol/L — ABNORMAL LOW (ref 22–32)
CREATININE: 0.45 mg/dL (ref 0.44–1.00)
Calcium: 8.7 mg/dL — ABNORMAL LOW (ref 8.9–10.3)
GFR calc non Af Amer: >60 mL/min (ref >60)
Glucose, Bld: 77 mg/dL (ref 65–99)
POTASSIUM: 3.6 mmol/L (ref 3.5–5.1)
Sodium: 134 mmol/L — ABNORMAL LOW (ref 135–145)
Total Bilirubin: 1.2 mg/dL (ref 0.3–1.2)
Total Protein: 6.3 g/dL — ABNORMAL LOW (ref 6.5–8.1)

## 2016-04-18 LAB — TYPE AND SCREEN
ABO/RH(D): A POS
Antibody Screen: NEGATIVE

## 2016-04-18 LAB — RAPID URINE DRUG SCREEN, HOSP PERFORMED
AMPHETAMINES: NOT DETECTED
BENZODIAZEPINES: NOT DETECTED
Barbiturates: NOT DETECTED
COCAINE: NOT DETECTED
OPIATES: NOT DETECTED
TETRAHYDROCANNABINOL: NOT DETECTED

## 2016-04-18 LAB — PROTEIN / CREATININE RATIO, URINE
CREATININE, URINE: 112 mg/dL
PROTEIN CREATININE RATIO: 0.13 mg/mg{creat} (ref 0.00–0.15)
TOTAL PROTEIN, URINE: 15 mg/dL

## 2016-04-18 LAB — GROUP B STREP BY PCR: GROUP B STREP BY PCR: NEGATIVE

## 2016-04-18 LAB — OB RESULTS CONSOLE GBS: GBS: NEGATIVE

## 2016-04-18 MED ORDER — PHENYLEPHRINE 40 MCG/ML (10ML) SYRINGE FOR IV PUSH (FOR BLOOD PRESSURE SUPPORT)
80.0000 ug | PREFILLED_SYRINGE | INTRAVENOUS | Status: DC | PRN
  Administered 2016-04-18: 80 ug via INTRAVENOUS
  Filled 2016-04-18: qty 5

## 2016-04-18 MED ORDER — OXYTOCIN 40 UNITS IN LACTATED RINGERS INFUSION - SIMPLE MED
INTRAVENOUS | Status: AC
  Filled 2016-04-18: qty 1000

## 2016-04-18 MED ORDER — EPHEDRINE 5 MG/ML INJ
10.0000 mg | INTRAVENOUS | Status: DC | PRN
  Filled 2016-04-18: qty 4

## 2016-04-18 MED ORDER — PRENATAL MULTIVITAMIN CH
1.0000 | ORAL_TABLET | Freq: Every day | ORAL | Status: DC
  Administered 2016-04-19: 1 via ORAL
  Filled 2016-04-18 (×2): qty 1

## 2016-04-18 MED ORDER — CONCEPT OB 130-92.4-1 MG PO CAPS: 1.0000 | ORAL_CAPSULE | Freq: Every day | ORAL | Status: DC

## 2016-04-18 MED ORDER — FENTANYL 2.5 MCG/ML BUPIVACAINE 1/10 % EPIDURAL INFUSION (WH - ANES)
14.0000 mL/h | INTRAMUSCULAR | Status: DC | PRN
  Administered 2016-04-18: 14 mL/h via EPIDURAL
  Filled 2016-04-18: qty 100

## 2016-04-18 MED ORDER — FENTANYL CITRATE (PF) 100 MCG/2ML IJ SOLN
100.0000 ug | INTRAMUSCULAR | Status: DC | PRN
  Administered 2016-04-18: 100 ug via INTRAVENOUS
  Filled 2016-04-18: qty 2

## 2016-04-18 MED ORDER — LACTATED RINGERS IV SOLN: INTRAVENOUS | Status: DC

## 2016-04-18 MED ORDER — SENNOSIDES-DOCUSATE SODIUM 8.6-50 MG PO TABS
2.0000 | ORAL_TABLET | ORAL | Status: DC
  Administered 2016-04-19 (×2): 2 via ORAL
  Filled 2016-04-18 (×2): qty 2

## 2016-04-18 MED ORDER — OXYTOCIN BOLUS FROM INFUSION
500.0000 mL | Freq: Once | INTRAVENOUS | Status: AC
  Administered 2016-04-18: 500 mL via INTRAVENOUS

## 2016-04-18 MED ORDER — ACETAMINOPHEN 325 MG PO TABS: 650.0000 mg | ORAL_TABLET | ORAL | Status: DC | PRN

## 2016-04-18 MED ORDER — OXYTOCIN 40 UNITS IN LACTATED RINGERS INFUSION - SIMPLE MED: 2.5000 [IU]/h | INTRAVENOUS | Status: DC

## 2016-04-18 MED ORDER — ONDANSETRON HCL 4 MG/2ML IJ SOLN
4.0000 mg | INTRAMUSCULAR | Status: DC | PRN
  Administered 2016-04-19: 4 mg via INTRAVENOUS
  Filled 2016-04-18: qty 2

## 2016-04-18 MED ORDER — LACTATED RINGERS IV SOLN: 500.0000 mL | Freq: Once | INTRAVENOUS | Status: DC

## 2016-04-18 MED ORDER — DIPHENHYDRAMINE HCL 25 MG PO CAPS
25.0000 mg | ORAL_CAPSULE | Freq: Four times a day (QID) | ORAL | Status: DC | PRN
  Administered 2016-04-19: 25 mg via ORAL
  Filled 2016-04-18: qty 1

## 2016-04-18 MED ORDER — OXYTOCIN 40 UNITS IN LACTATED RINGERS INFUSION - SIMPLE MED
1.0000 m[IU]/min | INTRAVENOUS | Status: DC
  Administered 2016-04-18: 2 m[IU]/min via INTRAVENOUS

## 2016-04-18 MED ORDER — SOD CITRATE-CITRIC ACID 500-334 MG/5ML PO SOLN
30.0000 mL | ORAL | Status: DC | PRN
  Filled 2016-04-18: qty 15

## 2016-04-18 MED ORDER — TETANUS-DIPHTH-ACELL PERTUSSIS 5-2.5-18.5 LF-MCG/0.5 IM SUSP: 0.5000 mL | Freq: Once | INTRAMUSCULAR | Status: DC

## 2016-04-18 MED ORDER — LIDOCAINE HCL (PF) 1 % IJ SOLN
INTRAMUSCULAR | Status: DC | PRN
  Administered 2016-04-18: 4 mL via EPIDURAL

## 2016-04-18 MED ORDER — OXYCODONE-ACETAMINOPHEN 5-325 MG PO TABS
2.0000 | ORAL_TABLET | ORAL | Status: DC | PRN
  Administered 2016-04-18 – 2016-04-20 (×8): 2 via ORAL
  Filled 2016-04-18 (×8): qty 2

## 2016-04-18 MED ORDER — EPHEDRINE 5 MG/ML INJ: 10.0000 mg | INTRAVENOUS | Status: DC | PRN

## 2016-04-18 MED ORDER — ZOLPIDEM TARTRATE 5 MG PO TABS: 5.0000 mg | ORAL_TABLET | Freq: Every evening | ORAL | Status: DC | PRN

## 2016-04-18 MED ORDER — LIDOCAINE HCL (PF) 1 % IJ SOLN
INTRAMUSCULAR | Status: AC
  Filled 2016-04-18: qty 30

## 2016-04-18 MED ORDER — BENZOCAINE-MENTHOL 20-0.5 % EX AERO
1.0000 "application " | INHALATION_SPRAY | CUTANEOUS | Status: DC | PRN
  Filled 2016-04-18: qty 56

## 2016-04-18 MED ORDER — LIDOCAINE HCL (PF) 1 % IJ SOLN
30.0000 mL | INTRAMUSCULAR | Status: DC | PRN
  Filled 2016-04-18: qty 30

## 2016-04-18 MED ORDER — IBUPROFEN 600 MG PO TABS
600.0000 mg | ORAL_TABLET | Freq: Four times a day (QID) | ORAL | Status: DC
  Administered 2016-04-18 – 2016-04-20 (×8): 600 mg via ORAL
  Filled 2016-04-18 (×8): qty 1

## 2016-04-18 MED ORDER — OXYCODONE-ACETAMINOPHEN 5-325 MG PO TABS: 1.0000 | ORAL_TABLET | ORAL | Status: DC | PRN

## 2016-04-18 MED ORDER — DIPHENHYDRAMINE HCL 50 MG/ML IJ SOLN: 12.5000 mg | INTRAMUSCULAR | Status: DC | PRN

## 2016-04-18 MED ORDER — PHENYLEPHRINE 40 MCG/ML (10ML) SYRINGE FOR IV PUSH (FOR BLOOD PRESSURE SUPPORT)
80.0000 ug | PREFILLED_SYRINGE | INTRAVENOUS | Status: DC | PRN
  Administered 2016-04-18: 80 ug via INTRAVENOUS
  Filled 2016-04-18: qty 5
  Filled 2016-04-18: qty 10

## 2016-04-18 MED ORDER — SIMETHICONE 80 MG PO CHEW: 80.0000 mg | CHEWABLE_TABLET | ORAL | Status: DC | PRN

## 2016-04-18 MED ORDER — DIBUCAINE 1 % RE OINT: 1.0000 "application " | TOPICAL_OINTMENT | RECTAL | Status: DC | PRN

## 2016-04-18 MED ORDER — TERBUTALINE SULFATE 1 MG/ML IJ SOLN
0.2500 mg | Freq: Once | INTRAMUSCULAR | Status: DC | PRN
  Filled 2016-04-18: qty 1

## 2016-04-18 MED ORDER — OXYCODONE-ACETAMINOPHEN 5-325 MG PO TABS
1.0000 | ORAL_TABLET | ORAL | Status: DC | PRN
Start: 2016-04-18 — End: 2016-04-18

## 2016-04-18 MED ORDER — LACTATED RINGERS IV SOLN: 500.0000 mL | INTRAVENOUS | Status: DC | PRN

## 2016-04-18 MED ORDER — ACETAMINOPHEN 325 MG PO TABS
650.0000 mg | ORAL_TABLET | ORAL | Status: DC | PRN
Start: 2016-04-18 — End: 2016-04-20

## 2016-04-18 MED ORDER — WITCH HAZEL-GLYCERIN EX PADS
1.0000 | MEDICATED_PAD | CUTANEOUS | Status: DC | PRN
Start: 2016-04-18 — End: 2016-04-20

## 2016-04-18 MED ORDER — ONDANSETRON HCL 4 MG PO TABS: 4.0000 mg | ORAL_TABLET | ORAL | Status: DC | PRN

## 2016-04-18 MED ORDER — OXYCODONE-ACETAMINOPHEN 5-325 MG PO TABS: 2.0000 | ORAL_TABLET | ORAL | Status: DC | PRN

## 2016-04-18 MED ORDER — COCONUT OIL OIL: 1.0000 "application " | TOPICAL_OIL | Status: DC | PRN

## 2016-04-18 MED ORDER — PHENYLEPHRINE 40 MCG/ML (10ML) SYRINGE FOR IV PUSH (FOR BLOOD PRESSURE SUPPORT): 80.0000 ug | PREFILLED_SYRINGE | INTRAVENOUS | Status: DC | PRN

## 2016-04-18 MED ORDER — ONDANSETRON HCL 4 MG/2ML IJ SOLN: 4.0000 mg | Freq: Four times a day (QID) | INTRAMUSCULAR | Status: DC | PRN

## 2016-04-18 NOTE — MAU Note (Signed)
Pt reports having ctx off and on all morning. Arrived EMS. Denies SROM or bleeding.

## 2016-04-18 NOTE — Anesthesia Procedure Notes (Signed)
Epidural Patient location during procedure: OB Start time: 04/18/2016 2:44 PM End time: 04/18/2016 2:50 PM  Staffing Anesthesiologist: Shona SimpsonHOLLIS, Hailey Grantz D Performed: anesthesiologist   Preanesthetic Checklist Completed: patient identified, site marked, surgical consent, pre-op evaluation, timeout performed, IV checked, risks and benefits discussed and monitors and equipment checked  Epidural Patient position: sitting Prep: ChloraPrep Patient monitoring: heart rate, continuous pulse ox and blood pressure Approach: midline Location: L3-L4 Injection technique: LOR saline  Needle:  Needle type: Tuohy  Needle gauge: 17 G Needle length: 9 cm Catheter type: closed end flexible Catheter size: 20 Guage Test dose: negative and 1.5% lidocaine  Assessment Events: blood not aspirated, injection not painful, no injection resistance and no paresthesia  Additional Notes LOR @ 5  Patient identified. Risks/Benefits/Options discussed with patient including but not limited to bleeding, infection, nerve damage, paralysis, failed block, incomplete pain control, headache, blood pressure changes, nausea, vomiting, reactions to medications, itching and postpartum back pain. Confirmed with bedside nurse the patient's most recent platelet count. Confirmed with patient that they are not currently taking any anticoagulation, have any bleeding history or any family history of bleeding disorders. Patient expressed understanding and wished to proceed. All questions were answered. Sterile technique was used throughout the entire procedure. Please see nursing notes for vital signs. Test dose was given through epidural catheter and negative prior to continuing to dose epidural or start infusion. Warning signs of high block given to the patient including shortness of breath, tingling/numbness in hands, complete motor block, or any concerning symptoms with instructions to call for help. Patient was given instructions on fall  risk and not to get out of bed. All questions and concerns addressed with instructions to call with any issues or inadequate analgesia.    Reason for block:procedure for pain

## 2016-04-18 NOTE — Anesthesia Preprocedure Evaluation (Signed)
Anesthesia Evaluation  Patient identified by MRN, date of birth, ID band Patient awake    Reviewed: Allergy & Precautions, NPO status , Patient's Chart, lab work & pertinent test results  Airway Mallampati: II  TM Distance: >3 FB Neck ROM: Full    Dental  (+) Teeth Intact, Dental Advisory Given   Pulmonary Current Smoker,    breath sounds clear to auscultation       Cardiovascular hypertension,  Rhythm:Regular Rate:Normal     Neuro/Psych Seizures -,  PSYCHIATRIC DISORDERS Bipolar Disorder Schizophrenia    GI/Hepatic negative GI ROS, Neg liver ROS,   Endo/Other  negative endocrine ROS  Renal/GU negative Renal ROS  negative genitourinary   Musculoskeletal negative musculoskeletal ROS (+)   Abdominal   Peds negative pediatric ROS (+)  Hematology  (+) Sickle cell trait ,   Anesthesia Other Findings   Reproductive/Obstetrics (+) Pregnancy                             Lab Results  Component Value Date   WBC 11.8 (H) 04/18/2016   HGB 11.3 (L) 04/18/2016   HCT 32.4 (L) 04/18/2016   MCV 89.8 04/18/2016   PLT 185 04/18/2016     Anesthesia Physical Anesthesia Plan  ASA: II  Anesthesia Plan: Epidural   Post-op Pain Management:    Induction:   Airway Management Planned:   Additional Equipment:   Intra-op Plan:   Post-operative Plan:   Informed Consent: I have reviewed the patients History and Physical, chart, labs and discussed the procedure including the risks, benefits and alternatives for the proposed anesthesia with the patient or authorized representative who has indicated his/her understanding and acceptance.     Plan Discussed with:   Anesthesia Plan Comments:         Anesthesia Quick Evaluation

## 2016-04-18 NOTE — H&P (Signed)
[name] is a 36 y.o. female presenting for Labor.   States contractions are every 2-3 minutes for the past couple of hours. Denies leaking or bleeding.   Pregnancy has been followed in clinic and remarkable for:  Patient Active Problem List   Diagnosis Date Noted  . Normal labor 04/18/2016  . Limited prenatal care 02/08/2016  . Fetal arrhythmia affecting pregnancy, antepartum 02/08/2016  . Tobacco abuse 12/19/2014  . Bipolar disorder (HCC) 12/19/2014  . History of marijuana use 12/19/2014  . Chronic hypertension affecting pregnancy 12/19/2014  . Sickle cell trait (HCC) 12/19/2014  . Supervision of high-risk pregnancy 12/19/2014     . OB History    Gravida Para Term Preterm AB Living   14 7 7  0 6 7   SAB TAB Ectopic Multiple Live Births   6 0 0 0 7     Past Medical History:  Diagnosis Date  . Anemia   . Bipolar 1 disorder (HCC)   . Chlamydia   . Chronic abdominal pain   . Chronic nausea   . Deliberate self-cutting   . HSV-2 infection 2015  . Hypertension   . Infection    MRSA in 1995, negative since  . Medical history non-contributory   . Schizophrenia (HCC)   . Seizures (HCC)    Not recently  . Sickle cell trait (HCC)   . Trichomonas infection    Past Surgical History:  Procedure Laterality Date  . DILATION AND CURETTAGE OF UTERUS    . HEMORROIDECTOMY  2010  . plastic surgery on face     Family History: family history includes Cancer in her mother; Diabetes in her mother; Heart failure in her mother; Hypertension in her maternal grandmother and mother; Stroke in her mother. Social History:  reports that she has been smoking Cigarettes.  She has a 0.75 pack-year smoking history. She has never used smokeless tobacco. She reports that she uses drugs, including Marijuana and Cocaine. She reports that she does not drink alcohol.     Maternal Diabetes: No  Refused glucose challenge Genetic Screening: Declined  Too late Maternal Ultrasounds/Referrals:  Normal Fetal Ultrasounds or other Referrals:  None  scheduled Maternal Substance Abuse:  Last cocaine screen was in 2015.  Negative drug screens since then. Significant Maternal Medications:  None Significant Maternal Lab Results:  None Other Comments:  Group B strep not done in office  First and Last office visit was 02/08/16  ROS Maternal Medical History:  Reason for admission: Contractions.   Contractions: Onset was 1-2 hours ago.   Frequency: regular.   Perceived severity is strong.    Fetal activity: Perceived fetal activity is normal.   Last perceived fetal movement was within the past hour.    Prenatal complications: PIH.   No bleeding, oligohydramnios, pre-eclampsia, preterm labor or substance abuse (Had cocaine + in 2015).   Prenatal Complications - Diabetes: none. Refused glucola   Dilation: 5 Effacement (%): 80 Station: -2, -1 Exam by:: M.Amey Hossain,CNM Blood pressure 135/90, temperature 97.9 F (36.6 C), resp. rate 18, last menstrual period 08/21/2015, not currently breastfeeding. Maternal Exam:  Uterine Assessment: Contraction strength is firm.  Contraction frequency is regular.   Abdomen: Patient reports no abdominal tenderness. Fundal height is 36.   Estimated fetal weight is 7.   Fetal presentation: vertex  Introitus: Normal vulva. Normal vagina.  Ferning test: not done.  Nitrazine test: not done. Amniotic fluid character: not assessed.  Pelvis: adequate for delivery.   Cervix: Cervix evaluated by  digital exam.     Fetal Exam Fetal Monitor Review: Mode: ultrasound.   Baseline rate: 135.  Variability: moderate (6-25 bpm).   Pattern: accelerations present and no decelerations.    Fetal State Assessment: Category I - tracings are normal.     Physical Exam  Constitutional: She is oriented to person, place, and time. She appears well-developed and well-nourished. She appears distressed.  HENT:  Head: Normocephalic.  Cardiovascular: Normal rate and  regular rhythm.   Respiratory: Effort normal. No respiratory distress. She has no wheezes. She has no rales. She exhibits no tenderness.  GI: Soft. She exhibits no distension. There is no tenderness. There is no rebound and no guarding.  Genitourinary: Vagina normal.  Genitourinary Comments: Dilation: 5 Effacement (%): 80 Station: -2, -1 Presentation: Vertex Exam by:: M.Loletha Bertini,CNM   Musculoskeletal: Normal range of motion.  Neurological: She is alert and oriented to person, place, and time.  Skin: Skin is warm and dry.  Psychiatric: She has a normal mood and affect.    Prenatal labs: ABO, Rh: --/--/A POS (11/01 1635) Antibody: NEG (11/01 1635) Rubella: 1.21 (11/01 1638) RPR: Non Reactive (11/01 1638)  HBsAg: Negative (11/01 1638)  HIV: Non Reactive (11/01 1638)  GBS:     Assessment/Plan: SIUP at 7642w4d Active Labor Elevated BP Limited prenatal care  [facility] Routine orders Epidural per request GBS and cultures UDS Anticipate SVD soon   Wynelle BourgeoisMarie Lilliam Chamblee 04/18/2016, 1:58 PM

## 2016-04-19 ENCOUNTER — Encounter: Payer: Self-pay | Admitting: Student

## 2016-04-19 LAB — CBC
HEMATOCRIT: 29.7 % — AB (ref 36.0–46.0)
Hemoglobin: 10.5 g/dL — ABNORMAL LOW (ref 12.0–15.0)
MCH: 31.3 pg (ref 26.0–34.0)
MCHC: 35.4 g/dL (ref 30.0–36.0)
MCV: 88.4 fL (ref 78.0–100.0)
Platelets: 170 10*3/uL (ref 150–400)
RBC: 3.36 MIL/uL — AB (ref 3.87–5.11)
RDW: 15.6 % — AB (ref 11.5–15.5)
WBC: 16 10*3/uL — AB (ref 4.0–10.5)

## 2016-04-19 LAB — HIV ANTIBODY (ROUTINE TESTING W REFLEX): HIV Screen 4th Generation wRfx: NONREACTIVE

## 2016-04-19 LAB — RPR: RPR Ser Ql: NONREACTIVE

## 2016-04-19 LAB — GC/CHLAMYDIA PROBE AMP (~~LOC~~) NOT AT ARMC
Chlamydia: NEGATIVE
Neisseria Gonorrhea: NEGATIVE

## 2016-04-19 NOTE — Progress Notes (Signed)
POSTPARTUM PROGRESS NOTE  Post Partum Day 1 Subjective:  [name] is a 36 y.o. [id number] 5137w4d s/p SVD.  No acute events overnight.  Pt denies problems with ambulating, voiding or po intake.  She reports nausea or vomiting.  Pain is well controlled with motrin, although patient reports cramping.  She has had flatus. She has not had bowel movement.  Lochia Moderate. Patient also endorses mild dizziness, denies SOB.    Objective: Blood pressure 122/71, pulse 67, temperature 98.7 F (37.1 C), temperature source Oral, resp. rate 18, height 5\' 3"  (1.6 m), weight 71.7 kg (158 lb), last menstrual period 08/21/2015, unknown if currently breastfeeding.  Physical Exam:  General: alert, cooperative and no distress Lochia:normal flow Abdomen: soft, moderate abdominal tenderness  Uterine Fundus: firm DVT Evaluation: No calf swelling or tenderness Extremities: No edema   Recent Labs  04/18/16 1401 04/19/16 0543  HGB 11.3* 10.5*  HCT 32.4* 29.7*    Assessment/Plan:  ASSESSMENT: [name] is a 36 y.o. [id number] 6937w4d s/p SVD.  Plan for discharge tomorrow  SW consult  Boy (no circumcision), bottle feeding, still undecided about method of contraception   LOS: 1 day   Idelle LeechHolton Dunville, Medical Student PGY-1 [facility]  04/19/2016, 3:04 PM    OB FELLOW MEDICAL STUDENT NOTE ATTESTATION  I have seen and examined this patient. Note this is a Psychologist, occupationalmedical student note and as such does not necessarily reflect the patient's plan of care. Please see progress note for this date of service.    Jen MowElizabeth Lakethia Coppess, DO [place] Fellow 04/19/2016

## 2016-04-19 NOTE — Clinical Social Work Maternal (Signed)
CLINICAL SOCIAL WORK MATERNAL/CHILD NOTE  Patient Details  Name: [name] MRN: [id number] Date of Birth: [birth year]  Date:  04/19/2016  Clinical Social Worker Initiating Note:  Laurey Arrow Date/ Time Initiated:  04/19/16/1000     Child's Name:  [name]   Legal Guardian:  Mother   Need for Interpreter:  None   Date of Referral:  04/19/16     Reason for Referral:  Behavioral Health Issues, including SI , Current Substance Use/Substance Use During Pregnancy    Referral Source:  [facility]   Address:  [place] [address]  Phone number:  [phone]   Household Members:  Self   Natural Supports (not living in the home):  Extended Family, Immediate Family, Friends, Parent   Professional Supports: Case Metallurgist, Transport planner (CPS Social Worker, Audiological scientist)   Employment: Disabled   Type of Work:     Education:      Pensions consultant:  [employer], Information systems manager    Other Resources:  Physicist, medical , [place] Considerations Which May Impact Care:  Per [name] Presenter, broadcasting, MOB is [name].   Strengths:  Ability to meet basic needs , Compliance with medical plan , Home prepared for child , Pediatrician chosen , Understanding of illness   Risk Factors/Current Problems:  DHHS Involvement , Mental Health Concerns , Substance Use    Cognitive State:  Alert , Able to Concentrate , Linear Thinking    Mood/Affect:  Tearful , Irritable , Agitated , Anxious    CSW Assessment: CSW met with MOB to complete an assessment for MH and CPS hx.  When CSW arrived MOB was attaching and bonding with infant as evident by engaging in skin to skin.  MOB introduced her room guest as MOB's mother [name]), and MOB gave CSW permission to meet with MOB while MOB's mother was present.  MOB presented as irritable and hesitant to talk with CSW.  However, MOB's mom was supportive and encouraged MOB to engage with CSW.   MOB answered limited questions and consistently ask for clarification for CSW consult.  MOB refused to share with CSW,  MOB's older children names, DOB, and current custody status.  However, MOB openly shared MOB's MH hx, and MOB's upcoming appointments.  MOB informed CSW of MOB's outpatient therapy appointment with [name], at [facility] on Tuesday, April 23, 2016, and MOB's medication evaluation appointment at [facility], on June 03, 2016. MOB acknowledged a MH hx of bipolar and expressed hat MOB is being re-evaluated for schizophrenia. CSW praised MOB for actively participating in therapy and encouraged MOB to continue to attend MOB's scheduled appointments.  CSW educated MOB about PPD. CSW informed MOB of possible supports and interventions to decrease PPD.  CSW also encouraged MOB to seek medical attention if needed for increased signs and symptoms for PPD. CSW inquired about MOB's supports and MOB and MOB's mother communicated that MOB has a wealth of supports from MOB's immediate and extended family members.  MOB reported FOB [name]) is currently residing in [place], [place], and MOB has no contact with him. MOB refused to give CSW any additional information about FOB.  CSW educated MOB about safe sleep and SIDS and MOB reported that MOB has a pack n play for infant and all other necessary items (car seat, bottle, blankets, etc.).  CSW inquired about MOB's substance use hx, and MOB acknowledged that use of substance in 2015.  MOB reported that MOB had not used any  substance since 2015.  CSW informed MOB of the hospital's policy relating substance use and encouraged MOB to ask questions.  CSW thanked MOB for being open and honest about her substance hx. CSW informed MOB of the two screenings for the infant. CSW informed MOB that the infant had a negative UDS and communicated that CSW would continue to monitor the infant's CDS. MOB was made aware that CSW would contact [name], if the infant's CDS is  positive without an explanation.  CSW also informed MOB that CSW was making a report to [facility] CPS due to MOB's MH hx and CPS hx (MOB does not have custody of MOB's older 7 children; CPS involvement). MOB frustration increased as evident by MOB's increased voice tone and direct eye contact with CSW. MOB was encouraged by MOB's mother and CSW to continue to relax and be understanding of CSW's role.  MOB eventually became calm, but refused to answer any additional questions.    During CSW assessment, an unknown African-American female entered the room, and MOB non-verbally asked him to leave the room.  CSW inquired about the young man (wearing infant's support bracelet), and MOB responded, "he is a friend".  MOB refused to disclose her friend's name.    CSW spoke met with CPS worker, Alinda Money, and updated CPS on CSW findings after clinical assessment.  CPS conduct an interview and informed CSW that infant can be d/c to MOB and MOB's aunt [name]), when medically cleared.  CPS is requiring that MOB's aunt show a valid government issued ID at d/c. CSW updated Teaching Service, and communicated an extended hospital stay (Tuesday 04/23/16), is no longer necessary.   CSW Plan/Description:  Child Copy Report , Patient/Family Education , No Further Intervention Required/No Barriers to Discharge, Information/Referral to [employer]  (Infant can be d/c to MOB, However, MOB's Aunt, [employer], MUST be present and has to have a valid ID)    Cordie Buening D BOYD-GILYARD, LCSW 04/19/2016, 3:12 PM

## 2016-04-19 NOTE — Progress Notes (Signed)
Post Partum Day 1 Subjective: up ad lib, voiding, tolerating PO and Upset that "people can come and tell you what to do with your children"  Upset that her older children were taken away and that SW saw her for this baby  Objective: Blood pressure 122/71, pulse 67, temperature 98.7 F (37.1 C), temperature source Oral, resp. rate 18, height 5\' 3"  (1.6 m), weight 158 lb (71.7 kg), last menstrual period 08/21/2015, unknown if currently breastfeeding.  Physical Exam:  General: alert, cooperative and no distress Lochia: appropriate Uterine Fundus: firm Incision: healing well DVT Evaluation: No evidence of DVT seen on physical exam.   Recent Labs  04/18/16 1401 04/19/16 0543  HGB 11.3* 10.5*  HCT 32.4* 29.7*    Assessment/Plan: Plan for discharge tomorrow  With Aunt present Reassured her that we all just want what is best for the whole family    LOS: 1 day   Hailey BourgeoisMarie Najai Crane 04/19/2016, 6:39 PM

## 2016-04-19 NOTE — Progress Notes (Signed)
UR chart review completed.  

## 2016-04-19 NOTE — Progress Notes (Signed)
CSW made a report to [name] CPS worker, Pam Miller.  CPS will follow-up with CSW prior to MOB's and infant's d/c.  At this time there are barriers to d/c for infant.   Emarie Paul Boyd-Gilyard, MSW, LCSW Clinical Social Work [phone]   

## 2016-04-19 NOTE — Anesthesia Postprocedure Evaluation (Addendum)
Anesthesia Post Note  Patient: [name]  Procedure(s) Performed: * No procedures listed *  Patient location during evaluation: Mother Baby Anesthesia Type: Epidural Level of consciousness: awake and alert Pain management: pain level controlled Vital Signs Assessment: post-procedure vital signs reviewed and stable Respiratory status: spontaneous breathing Cardiovascular status: blood pressure returned to baseline Postop Assessment: no headache, patient able to bend at knees, no backache, no signs of nausea or vomiting, epidural receding and adequate PO intake Anesthetic complications: no        Last Vitals:  Vitals:   04/19/16 0030 04/19/16 0500  BP: (!) 145/90 122/71  Pulse: 72 67  Resp: 18 18  Temp: 36.6 C 37.1 C    Last Pain:  Vitals:   04/19/16 0843  TempSrc:   PainSc: 8    Pain Goal: Patients Stated Pain Goal: 0 (04/18/16 2132)               Jennye MoccasinSterling, Weldon Pickingharlesetta Marie

## 2016-04-19 NOTE — Progress Notes (Addendum)
CSW made MOB aware that MOB can be d/c with infant when medically cleared.  MOB was informed that MOB's aunt, Tanya Stover, MUST be present at d/c and MUST present a valid government issued ID.    A copy of Tanya Stover ID should be placed in infant's chart at d/c.   Houda Brau Boyd-Gilyard, MSW, LCSW Clinical Social Work [phone]  

## 2016-04-20 MED ORDER — IBUPROFEN 600 MG PO TABS: 600.0000 mg | ORAL_TABLET | Freq: Four times a day (QID) | ORAL | 0 refills | Status: DC

## 2016-04-20 NOTE — Discharge Instructions (Signed)

## 2016-04-20 NOTE — Discharge Summary (Signed)
OB Discharge Summary     Patient Name: [name] DOB: 07/13/1980 MRN: [id number]  Date of admission: 04/18/2016 Delivering MD: Hermina StaggersERVIN, MICHAEL L   Date of discharge: 04/21/2016  Admitting diagnosis: LABOR Intrauterine pregnancy: 3569w4d     Secondary diagnosis:  Active Problems:   Normal labor   Vaginal delivery  Additional problems: Limited [facility]       Discharge diagnosis: Term Pregnancy Delivered                                                                                                Post partum procedures:none  Augmentation: none  Complications: None  Hospital course:  Onset of Labor With Vaginal Delivery     36 y.o. yo [id number] at 2869w4d was admitted in Active Labor on 04/18/2016. Patient had an uncomplicated labor course as follows:  Membrane Rupture Time/Date: 3:10 PM ,04/18/2016   Intrapartum Procedures: Episiotomy: None [1]                                         Lacerations:  None [1]  Patient had a delivery of a Viable infant. 04/18/2016  Information for the patient's newborn:  Rosezetta SchlatterHilliard, Boy Dymond [[id number]][[id number]]  Delivery Method: Vaginal, Spontaneous Delivery (Filed from Delivery Summary)    Pateint had an uncomplicated postpartum course.  She is ambulating, tolerating a regular diet, passing flatus, and urinating well. Patient is discharged home in stable condition on 04/21/16.    Physical exam  Vitals:   04/19/16 0030 04/19/16 0500 04/19/16 1838 04/20/16 0549  BP: (!) 145/90 122/71 133/77 125/78  Pulse: 72 67 67 67  Resp: 18 18 18 18   Temp: 97.8 F (36.6 C) 98.7 F (37.1 C) 97.6 F (36.4 C) 97.8 F (36.6 C)  TempSrc: Oral Oral Oral Oral  Weight:      Height:       General: alert, cooperative and no distress Lochia: appropriate Uterine Fundus: firm Incision: Healing well with no significant drainage, No significant erythema, Dressing is clean, dry, and intact DVT Evaluation: No evidence of DVT seen on physical exam. Labs: Lab Results   Component Value Date   WBC 16.0 (H) 04/19/2016   HGB 10.5 (L) 04/19/2016   HCT 29.7 (L) 04/19/2016   MCV 88.4 04/19/2016   PLT 170 04/19/2016   CMP Latest Ref Rng & Units 04/18/2016  Glucose 65 - 99 mg/dL 77  BUN 6 - 20 mg/dL 5(L)  Creatinine 9.620.44 - 1.00 mg/dL 9.520.45  Sodium 841135 - 324145 mmol/L 134(L)  Potassium 3.5 - 5.1 mmol/L 3.6  Chloride 101 - 111 mmol/L 107  CO2 22 - 32 mmol/L 16(L)  Calcium 8.9 - 10.3 mg/dL 4.0(N8.7(L)  Total Protein 6.5 - 8.1 g/dL 6.3(L)  Total Bilirubin 0.3 - 1.2 mg/dL 1.2  Alkaline Phos 38 - 126 U/L 100  AST 15 - 41 U/L 17  ALT 14 - 54 U/L 8(L)    Discharge instruction: per After Visit Summary and "Baby and Me Booklet".  After visit  meds:  Allergies as of 04/20/2016      Reactions   Peanut-containing Drug Products Anaphylaxis, Itching, Rash   Amoxicillin Hives, Other (See Comments)   Has patient had a PCN reaction causing immediate rash, facial/tongue/throat swelling, SOB or lightheadedness with hypotension: No Has patient had a PCN reaction causing severe rash involving mucus membranes or skin necrosis: No Has patient had a PCN reaction that required hospitalization No Has patient had a PCN reaction occurring within the last 10 years: No If all of the above answers are "NO", then may proceed with Cephalosporin use.   Latex Hives, Itching      Medication List    TAKE these medications   CONCEPT OB 130-92.4-1 MG Caps Take 1 capsule by mouth daily.   ibuprofen 600 MG tablet Commonly known as:  ADVIL,MOTRIN Take 1 tablet (600 mg total) by mouth every 6 (six) hours.       Diet: routine diet  Activity: Advance as tolerated. Pelvic rest for 6 weeks.   Outpatient follow up:6 weeks Follow up Appt: Future Appointments Date Time Provider Department Center  05/29/2016 10:00 AM Marylene Land, CNM [facility]   Follow up Visit:No Follow-up on file.  Postpartum contraception: Undecided  Newborn Data: Live born female  Birth Weight: 6 lb  6.8 oz (2915 g) APGAR: 9, 9  Baby Feeding: Bottle Disposition:home with mother only if aunt is present at DC per CSW   04/21/2016 Beaulah Dinning, MD  OB FELLOW DISCHARGE ATTESTATION  I have seen and examined this patient and agree with above documentation in the resident's note.   Ernestina Penna, MD 7:46 AM

## 2016-05-29 ENCOUNTER — Ambulatory Visit: Payer: Medicaid Other | Admitting: Student

## 2016-06-19 ENCOUNTER — Ambulatory Visit: Payer: Medicaid Other | Admitting: Medical

## 2016-07-03 ENCOUNTER — Ambulatory Visit (INDEPENDENT_AMBULATORY_CARE_PROVIDER_SITE_OTHER): Payer: Medicaid Other | Admitting: Advanced Practice Midwife

## 2016-07-03 ENCOUNTER — Ambulatory Visit: Payer: Medicaid Other | Admitting: Advanced Practice Midwife

## 2016-07-03 ENCOUNTER — Encounter: Payer: Self-pay | Admitting: Advanced Practice Midwife

## 2016-07-03 ENCOUNTER — Other Ambulatory Visit (HOSPITAL_COMMUNITY)
Admission: RE | Admit: 2016-07-03 | Discharge: 2016-07-03 | Disposition: A | Discharge status: Home / Self Care | Payer: Medicaid Other | Source: Ambulatory Visit | Attending: Advanced Practice Midwife | Admitting: Advanced Practice Midwife

## 2016-07-03 VITALS — BP 134/98 | HR 70 | Ht 63.0 in | Wt 142.5 lb

## 2016-07-03 DIAGNOSIS — N898 Other specified noninflammatory disorders of vagina: Secondary | ICD-10-CM | POA: Diagnosis not present

## 2016-07-03 DIAGNOSIS — B9689 Other specified bacterial agents as the cause of diseases classified elsewhere: Secondary | ICD-10-CM | POA: Diagnosis not present

## 2016-07-03 DIAGNOSIS — I10 Essential (primary) hypertension: Secondary | ICD-10-CM | POA: Diagnosis not present

## 2016-07-03 NOTE — Patient Instructions (Signed)
Vaginitis Vaginitis is an inflammation of the vagina. It can happen when the normal bacteria and yeast in the vagina grow too much. There are different types. Treatment will depend on the type you have. Follow these instructions at home:  Take all medicines as told by your doctor.  Keep your vagina area clean and dry. Avoid soap. Rinse the area with water.  Avoid washing and cleaning out the vagina (douching).  Do not use tampons or have sex (intercourse) until your treatment is done.  Wipe from front to back after going to the restroom.  Wear cotton underwear.  Avoid wearing underwear while you sleep until your vaginitis is gone.  Avoid tight pants. Avoid underwear or nylons without a cotton panel.  Take off wet clothing (such as a bathing suit) as soon as you can.  Use mild, unscented products. Avoid fabric softeners and scented:  Feminine sprays.  Laundry detergents.  Tampons.  Soaps or bubble baths.  Practice safe sex and use condoms. Get help right away if:  You have belly (abdominal) pain.  You have a fever or lasting symptoms for more than 2-3 days.  You have a fever and your symptoms suddenly get worse. This information is not intended to replace advice given to you by your health care provider. Make sure you discuss any questions you have with your health care provider. Document Released: 06/21/2008 Document Revised: 08/31/2015 Document Reviewed: 09/05/2011 Elsevier Interactive Patient Education  2017 Elsevier Inc. Medroxyprogesterone injection [Contraceptive] What is this medicine? MEDROXYPROGESTERONE (me DROX ee proe JES te rone) contraceptive injections prevent pregnancy. They provide effective birth control for 3 months. Depo-subQ Provera 104 is also used for treating pain related to endometriosis. This medicine may be used for other purposes; ask your health care provider or pharmacist if you have questions. COMMON BRAND NAME(S): Depo-Provera, Depo-subQ  Provera 104 What should I tell my health care provider before I take this medicine? They need to know if you have any of these conditions: -frequently drink alcohol -asthma -blood vessel disease or a history of a blood clot in the lungs or legs -bone disease such as osteoporosis -breast cancer -diabetes -eating disorder (anorexia nervosa or bulimia) -high blood pressure -HIV infection or AIDS -kidney disease -liver disease -mental depression -migraine -seizures (convulsions) -stroke -tobacco smoker -vaginal bleeding -an unusual or allergic reaction to medroxyprogesterone, other hormones, medicines, foods, dyes, or preservatives -pregnant or trying to get pregnant -breast-feeding How should I use this medicine? Depo-Provera Contraceptive injection is given into a muscle. Depo-subQ Provera 104 injection is given under the skin. These injections are given by a health care professional. You must not be pregnant before getting an injection. The injection is usually given during the first 5 days after the start of a menstrual period or 6 weeks after delivery of a baby. Talk to your pediatrician regarding the use of this medicine in children. Special care may be needed. These injections have been used in female children who have started having menstrual periods. Overdosage: If you think you have taken too much of this medicine contact a poison control center or emergency room at once. NOTE: This medicine is only for you. Do not share this medicine with others. What if I miss a dose? Try not to miss a dose. You must get an injection once every 3 months to maintain birth control. If you cannot keep an appointment, call and reschedule it. If you wait longer than 13 weeks between Depo-Provera contraceptive injections or longer than 14  weeks between Depo-subQ Provera 104 injections, you could get pregnant. Use another method for birth control if you miss your appointment. You may also need a  pregnancy test before receiving another injection. What may interact with this medicine? Do not take this medicine with any of the following medications: -bosentan This medicine may also interact with the following medications: -aminoglutethimide -antibiotics or medicines for infections, especially rifampin, rifabutin, rifapentine, and griseofulvin -aprepitant -barbiturate medicines such as phenobarbital or primidone -bexarotene -carbamazepine -medicines for seizures like ethotoin, felbamate, oxcarbazepine, phenytoin, topiramate -modafinil -St. John's wort This list may not describe all possible interactions. Give your health care provider a list of all the medicines, herbs, non-prescription drugs, or dietary supplements you use. Also tell them if you smoke, drink alcohol, or use illegal drugs. Some items may interact with your medicine. What should I watch for while using this medicine? This drug does not protect you against HIV infection (AIDS) or other sexually transmitted diseases. Use of this product may cause you to lose calcium from your bones. Loss of calcium may cause weak bones (osteoporosis). Only use this product for more than 2 years if other forms of birth control are not right for you. The longer you use this product for birth control the more likely you will be at risk for weak bones. Ask your health care professional how you can keep strong bones. You may have a change in bleeding pattern or irregular periods. Many females stop having periods while taking this drug. If you have received your injections on time, your chance of being pregnant is very low. If you think you may be pregnant, see your health care professional as soon as possible. Tell your health care professional if you want to get pregnant within the next year. The effect of this medicine may last a long time after you get your last injection. What side effects may I notice from receiving this medicine? Side  effects that you should report to your doctor or health care professional as soon as possible: -allergic reactions like skin rash, itching or hives, swelling of the face, lips, or tongue -breast tenderness or discharge -breathing problems -changes in vision -depression -feeling faint or lightheaded, falls -fever -pain in the abdomen, chest, groin, or leg -problems with balance, talking, walking -unusually weak or tired -yellowing of the eyes or skin Side effects that usually do not require medical attention (report to your doctor or health care professional if they continue or are bothersome): -acne -fluid retention and swelling -headache -irregular periods, spotting, or absent periods -temporary pain, itching, or skin reaction at site where injected -weight gain This list may not describe all possible side effects. Call your doctor for medical advice about side effects. You may report side effects to FDA at 1-800-FDA-1088. Where should I keep my medicine? This does not apply. The injection will be given to you by a health care professional. NOTE: This sheet is a summary. It may not cover all possible information. If you have questions about this medicine, talk to your doctor, pharmacist, or health care provider.  2018 Elsevier/Gold Standard (2008-04-15 18:37:56)

## 2016-07-03 NOTE — Progress Notes (Signed)
Subjective:     [name] is a 36 y.o. female who presents for a postpartum visit. She is 10 weeks postpartum following a spontaneous vaginal delivery. I have fully reviewed the prenatal and intrapartum course. The delivery was at 38.4 gestational weeks. Outcome: spontaneous vaginal delivery. Anesthesia: epidural. Postpartum course has been unremarkable. Baby's course has been unremarkable. Baby is feeding by bottle - Similac Isomil. Bleeding no bleeding. Bowel function is normal. Bladder function is normal. Patient is sexually active. Contraception method is none. Patient is interested in Depo Provera injections. Depression/Anxiety screening: negative.  The following portions of the patient's history were reviewed and updated as appropriate: allergies, current medications, past family history, past medical history, past social history, past surgical history and problem list.  Review of Systems Genitourinary:positive for vaginal discharge, no odor or itching   Also having some back pain and headaches.  Objective:      Vitals:   07/03/16 1249  BP: (!) 134/98  Pulse: 70     General:  alert, cooperative, no distress and on phone during visit talking to someone   Breasts:  inspection negative, no nipple discharge or bleeding, no masses or nodularity palpable  Lungs: clear to auscultation bilaterally  Heart:  regular rate and rhythm, S1, S2 normal, no murmur, click, rub or gallop  Abdomen: soft, non-tender; bowel sounds normal; no masses,  no organomegaly   Vulva:  normal  Well healed, no lesions or erethema  Vagina: normal vagina, no discharge, exudate, lesion, or erythema  Cervix:  no cervical motion tenderness  Corpus: not examined  Adnexa:  not evaluated  Rectal Exam: Not performed.        Assessment:     Normal postpartum exam. Pap smear not done at today's visit. Last pap in 2016 was normal  Plan:    1. Contraception: Depo-Provera injections 2. Had IC unprotected last  week.  Informed she needs to abstain and return next week for UPT and DepoProvera 3. Follow up in: 1 week or as needed.   4.   Will need to refer for Primary Care to manage hypertension. 5.    Wet prep and Gc/Chlam done today per pt request.  If she has BV, she wants brand name Flagyl

## 2016-07-04 LAB — CERVICOVAGINAL ANCILLARY ONLY
Bacterial vaginitis: POSITIVE — AB
CHLAMYDIA, DNA PROBE: NEGATIVE
Candida vaginitis: NEGATIVE
NEISSERIA GONORRHEA: NEGATIVE
TRICH (WINDOWPATH): NEGATIVE

## 2016-07-07 ENCOUNTER — Other Ambulatory Visit (HOSPITAL_COMMUNITY): Payer: Self-pay | Admitting: Advanced Practice Midwife

## 2016-07-07 MED ORDER — METRONIDAZOLE 500 MG PO TABS: 500.0000 mg | ORAL_TABLET | Freq: Two times a day (BID) | ORAL | 0 refills | Status: DC

## 2016-07-09 ENCOUNTER — Telehealth: Payer: Self-pay | Admitting: *Deleted

## 2016-07-09 NOTE — Telephone Encounter (Addendum)
Called pt and number rang many times without being answered. I was unable to leave a message.  Pt needs to be informed that she has +BV and a prescription has been sent to her pharmacy.   4/4  0900  Per chart review, pt has office appt today @ 1330 and can be informed of test results at that time.   4/4 1430  Pt did not come to scheduled appt today @ 1330. I called her and the phone rang many times again without an answer then disconnected.   4/5  1340  Called pt and did not get answer - unable to leave a message. Certified letter will be sent.

## 2016-07-10 ENCOUNTER — Ambulatory Visit: Payer: Medicaid Other

## 2016-07-11 ENCOUNTER — Encounter: Payer: Self-pay | Admitting: *Deleted

## 2016-08-05 ENCOUNTER — Telehealth: Payer: Self-pay | Admitting: *Deleted

## 2016-08-05 DIAGNOSIS — B9689 Other specified bacterial agents as the cause of diseases classified elsewhere: Secondary | ICD-10-CM

## 2016-08-05 DIAGNOSIS — N760 Acute vaginitis: Principal | ICD-10-CM

## 2016-08-05 MED ORDER — METRONIDAZOLE 500 MG PO TABS: 500.0000 mg | ORAL_TABLET | Freq: Two times a day (BID) | ORAL | 0 refills | Status: AC

## 2016-08-08 NOTE — Telephone Encounter (Signed)
error 

## 2016-08-27 ENCOUNTER — Encounter: Payer: Self-pay | Admitting: Student

## 2016-08-27 ENCOUNTER — Inpatient Hospital Stay (HOSPITAL_COMMUNITY)
Admission: AD | Admit: 2016-08-27 | Discharge: 2016-08-27 | Disposition: A | Discharge status: Home / Self Care | Payer: Medicaid Other | Source: Ambulatory Visit | Attending: Family Medicine | Admitting: Family Medicine

## 2016-08-27 ENCOUNTER — Inpatient Hospital Stay (HOSPITAL_COMMUNITY): Payer: Medicaid Other

## 2016-08-27 DIAGNOSIS — Z91040 Latex allergy status: Secondary | ICD-10-CM | POA: Insufficient documentation

## 2016-08-27 DIAGNOSIS — R109 Unspecified abdominal pain: Secondary | ICD-10-CM | POA: Diagnosis not present

## 2016-08-27 DIAGNOSIS — O09521 Supervision of elderly multigravida, first trimester: Secondary | ICD-10-CM | POA: Insufficient documentation

## 2016-08-27 DIAGNOSIS — N898 Other specified noninflammatory disorders of vagina: Secondary | ICD-10-CM | POA: Diagnosis present

## 2016-08-27 DIAGNOSIS — O26891 Other specified pregnancy related conditions, first trimester: Secondary | ICD-10-CM | POA: Diagnosis not present

## 2016-08-27 DIAGNOSIS — O30041 Twin pregnancy, dichorionic/diamniotic, first trimester: Secondary | ICD-10-CM

## 2016-08-27 DIAGNOSIS — Z3A01 Less than 8 weeks gestation of pregnancy: Secondary | ICD-10-CM | POA: Insufficient documentation

## 2016-08-27 DIAGNOSIS — Z87891 Personal history of nicotine dependence: Secondary | ICD-10-CM | POA: Diagnosis not present

## 2016-08-27 DIAGNOSIS — Z823 Family history of stroke: Secondary | ICD-10-CM | POA: Insufficient documentation

## 2016-08-27 DIAGNOSIS — Z833 Family history of diabetes mellitus: Secondary | ICD-10-CM | POA: Insufficient documentation

## 2016-08-27 DIAGNOSIS — N760 Acute vaginitis: Secondary | ICD-10-CM | POA: Diagnosis not present

## 2016-08-27 DIAGNOSIS — O23591 Infection of other part of genital tract in pregnancy, first trimester: Secondary | ICD-10-CM | POA: Insufficient documentation

## 2016-08-27 DIAGNOSIS — O9989 Other specified diseases and conditions complicating pregnancy, childbirth and the puerperium: Secondary | ICD-10-CM | POA: Diagnosis not present

## 2016-08-27 DIAGNOSIS — Z8249 Family history of ischemic heart disease and other diseases of the circulatory system: Secondary | ICD-10-CM | POA: Diagnosis not present

## 2016-08-27 DIAGNOSIS — R1030 Lower abdominal pain, unspecified: Secondary | ICD-10-CM | POA: Insufficient documentation

## 2016-08-27 DIAGNOSIS — O26899 Other specified pregnancy related conditions, unspecified trimester: Secondary | ICD-10-CM

## 2016-08-27 DIAGNOSIS — D573 Sickle-cell trait: Secondary | ICD-10-CM | POA: Insufficient documentation

## 2016-08-27 DIAGNOSIS — O99011 Anemia complicating pregnancy, first trimester: Secondary | ICD-10-CM | POA: Diagnosis not present

## 2016-08-27 DIAGNOSIS — B9689 Other specified bacterial agents as the cause of diseases classified elsewhere: Secondary | ICD-10-CM | POA: Insufficient documentation

## 2016-08-27 DIAGNOSIS — O219 Vomiting of pregnancy, unspecified: Secondary | ICD-10-CM | POA: Insufficient documentation

## 2016-08-27 LAB — CBC
HEMATOCRIT: 34.8 % — AB (ref 36.0–46.0)
Hemoglobin: 11.9 g/dL — ABNORMAL LOW (ref 12.0–15.0)
MCH: 29.7 pg (ref 26.0–34.0)
MCHC: 34.2 g/dL (ref 30.0–36.0)
MCV: 86.8 fL (ref 78.0–100.0)
PLATELETS: 202 10*3/uL (ref 150–400)
RBC: 4.01 MIL/uL (ref 3.87–5.11)
RDW: 15.3 % (ref 11.5–15.5)
WBC: 9.1 10*3/uL (ref 4.0–10.5)

## 2016-08-27 LAB — URINALYSIS, ROUTINE W REFLEX MICROSCOPIC
Bilirubin Urine: NEGATIVE
Glucose, UA: NEGATIVE mg/dL
Hgb urine dipstick: NEGATIVE
Ketones, ur: NEGATIVE mg/dL
LEUKOCYTES UA: NEGATIVE
NITRITE: NEGATIVE
PH: 7 (ref 5.0–8.0)
Protein, ur: NEGATIVE mg/dL
SPECIFIC GRAVITY, URINE: 1.024 (ref 1.005–1.030)

## 2016-08-27 LAB — WET PREP, GENITAL
SPERM: NONE SEEN
Trich, Wet Prep: NONE SEEN
YEAST WET PREP: NONE SEEN

## 2016-08-27 LAB — POCT PREGNANCY, URINE: Preg Test, Ur: POSITIVE — AB

## 2016-08-27 LAB — HCG, QUANTITATIVE, PREGNANCY: HCG, BETA CHAIN, QUANT, S: 233825 m[IU]/mL — AB (ref <5)

## 2016-08-27 MED ORDER — PROMETHAZINE HCL 25 MG PO TABS: 25.0000 mg | ORAL_TABLET | Freq: Four times a day (QID) | ORAL | 0 refills | Status: DC | PRN

## 2016-08-27 MED ORDER — PREPLUS 27-1 MG PO TABS: 1.0000 | ORAL_TABLET | Freq: Every day | ORAL | 11 refills | Status: DC

## 2016-08-27 MED ORDER — METRONIDAZOLE 0.75 % VA GEL: 1.0000 | Freq: Two times a day (BID) | VAGINAL | 0 refills | Status: AC

## 2016-08-27 NOTE — MAU Note (Signed)
Pt has been vomitting. Has had vaginal itching and fishy odor. Discharge is white and watery.

## 2016-08-27 NOTE — MAU Provider Note (Signed)
History     CSN: [id number]  Arrival date and time: 08/27/16 1220  First Provider Initiated Contact with Patient 08/27/16 1322      Chief Complaint  Patient presents with  . Vaginal Discharge  . Nausea  . Abdominal Pain   HPI [name] is a 36 y.o. [id number] at [redacted]w[redacted]d by LMP who presents with abdominal pain, n/v, & vaginal irritation. Symptoms began yesterday. Reports constant lower abdominal pain that feels sharp. Rates pain 9/10. Has not treated. Touching her abdomen makes pain worse, nothing makes better. Nausea & vomiting; vomited once this morning & twice yesterday.  Also reports thin green malodorous discharge. Discharge associated with vaginal burning. Denies vaginal bleeding, dyspareunia, postcoital bleeding, fever/chills, hematuria, diarrhea, or constipation. Last BM was yesterday.   OB History    Gravida Para Term Preterm AB Living   15 8 8  0 6 8   SAB TAB Ectopic Multiple Live Births   6 0 0 0 8      Past Medical History:  Diagnosis Date  . Anemia   . Bipolar 1 disorder (HCC)   . Chlamydia   . Chronic abdominal pain   . Chronic nausea   . Deliberate self-cutting   . HSV-2 infection 2015  . Hypertension   . Infection    MRSA in 1995, negative since  . Schizophrenia (HCC)   . Seizures (HCC)    Not recently  . Sickle cell trait (HCC)   . Trichomonas infection     Past Surgical History:  Procedure Laterality Date  . DILATION AND CURETTAGE OF UTERUS    . HEMORROIDECTOMY  2010  . plastic surgery on face      Family History  Problem Relation Age of Onset  . Cancer Mother   . Heart failure Mother   . Hypertension Mother   . Stroke Mother   . Diabetes Mother   . Hypertension Maternal Grandmother   . Anesthesia problems Neg Hx     Social History  Substance Use Topics  . Smoking status: Former Smoker    Packs/day: 0.50    Years: 1.50    Types: Cigarettes    Quit date: 04/08/2016  . Smokeless tobacco: Never Used  . Alcohol use No     Comment:  hx drug use    Allergies:  Allergies  Allergen Reactions  . Peanut-Containing Drug Products Anaphylaxis, Itching and Rash  . Amoxicillin Hives and Other (See Comments)    Has patient had a PCN reaction causing immediate rash, facial/tongue/throat swelling, SOB or lightheadedness with hypotension: No Has patient had a PCN reaction causing severe rash involving mucus membranes or skin necrosis: No Has patient had a PCN reaction that required hospitalization No Has patient had a PCN reaction occurring within the last 10 years: No If all of the above answers are "NO", then may proceed with Cephalosporin use.  . Latex Hives and Itching    Prescriptions Prior to Admission  Medication Sig Dispense Refill Last Dose  . ibuprofen (ADVIL,MOTRIN) 600 MG tablet Take 600 mg by mouth every 6 (six) hours.  0     Review of Systems  Constitutional: Negative.   Gastrointestinal: Positive for abdominal pain, nausea and vomiting. Negative for constipation and diarrhea.  Genitourinary: Positive for vaginal discharge. Negative for dyspareunia, dysuria and vaginal bleeding.   Physical Exam   Blood pressure 138/83, pulse 71, temperature 98.9 F (37.2 C), resp. rate 18, last menstrual period 07/08/2016, not currently breastfeeding.  Physical Exam  Nursing  note and vitals reviewed. Constitutional: She is oriented to person, place, and time. She appears well-developed and well-nourished. No distress.  HENT:  Head: Normocephalic and atraumatic.  Eyes: Conjunctivae are normal. Right eye exhibits no discharge. Left eye exhibits no discharge. No scleral icterus.  Neck: Normal range of motion.  Cardiovascular: Normal rate, regular rhythm and normal heart sounds.   No murmur heard. Respiratory: Effort normal and breath sounds normal. No respiratory distress. She has no wheezes.  GI: Soft. Bowel sounds are normal. She exhibits distension. There is tenderness in the right lower quadrant and left lower quadrant.  There is no rigidity, no rebound and no guarding.  Genitourinary: Uterus is enlarged. Cervix exhibits no motion tenderness and no friability. Right adnexum displays tenderness. Left adnexum displays tenderness and fullness. No bleeding in the vagina. Vaginal discharge (small amount of thin yellow discharge) found.  Genitourinary Comments: Cervix closed  Neurological: She is alert and oriented to person, place, and time.  Skin: Skin is warm and dry. She is not diaphoretic.  Psychiatric: She has a normal mood and affect. Her behavior is normal. Judgment and thought content normal.    MAU Course  Procedures Results for orders placed or performed during the hospital encounter of 08/27/16 (from the past 24 hour(s))  Urinalysis, Routine w reflex microscopic     Status: None   Collection Time: 08/27/16 12:38 PM  Result Value Ref Range   Color, Urine YELLOW YELLOW   APPearance CLEAR CLEAR   Specific Gravity, Urine 1.024 1.005 - 1.030   pH 7.0 5.0 - 8.0   Glucose, UA NEGATIVE NEGATIVE mg/dL   Hgb urine dipstick NEGATIVE NEGATIVE   Bilirubin Urine NEGATIVE NEGATIVE   Ketones, ur NEGATIVE NEGATIVE mg/dL   Protein, ur NEGATIVE NEGATIVE mg/dL   Nitrite NEGATIVE NEGATIVE   Leukocytes, UA NEGATIVE NEGATIVE  Pregnancy, urine POC     Status: Abnormal   Collection Time: 08/27/16 12:47 PM  Result Value Ref Range   Preg Test, Ur POSITIVE (A) NEGATIVE  CBC     Status: Abnormal   Collection Time: 08/27/16  1:50 PM  Result Value Ref Range   WBC 9.1 4.0 - 10.5 K/uL   RBC 4.01 3.87 - 5.11 MIL/uL   Hemoglobin 11.9 (L) 12.0 - 15.0 g/dL   HCT 11.9 (L) 14.7 - 82.9 %   MCV 86.8 78.0 - 100.0 fL   MCH 29.7 26.0 - 34.0 pg   MCHC 34.2 30.0 - 36.0 g/dL   RDW 56.2 13.0 - 86.5 %   Platelets 202 150 - 400 K/uL  hCG, quantitative, pregnancy     Status: Abnormal   Collection Time: 08/27/16  1:50 PM  Result Value Ref Range   hCG, Beta Chain, Sharene Butters, S 784,696 (H) <5 mIU/mL  Wet prep, genital     Status: Abnormal    Collection Time: 08/27/16  2:11 PM  Result Value Ref Range   Yeast Wet Prep HPF POC NONE SEEN NONE SEEN   Trich, Wet Prep NONE SEEN NONE SEEN   Clue Cells Wet Prep HPF POC PRESENT (A) NONE SEEN   WBC, Wet Prep HPF POC FEW (A) NONE SEEN   Sperm NONE SEEN    US Ob Comp Addl Gest Less 14 Wks  Result Date: 08/27/2016 CLINICAL DATA:  36 year old pregnant female with abdominal pain. EDC by LMP: 04/14/2017, projecting to an expected gestational age of [redacted] weeks 1 day. EXAM: TWIN OBSTETRIC <14WK [place] AND TRANSVAGINAL OB US COMPARISON:  No prior scans from  this gestation. FINDINGS: Number of IUPs:  2 Chorionicity/Amnionicity:  Dichorionic-diamniotic (thick membrane) TWIN A (maternal right side) Yolk sac:  Visualized. Embryo:  Visualized. Embryonic Cardiac Activity: Regular rate and rhythm. Embryonic Heart Rate: 146 bpm CRL:  8.0  mm   6 w 5 d                  US EDC: 04/17/2017 TWIN B (maternal left side) Yolk sac:  Visualized. Embryo:  Visualized. Embryonic Cardiac Activity: Regular rate and rhythm. Embryonic Heart Rate: 150 bpm CRL:  8.8  mm   6 w 5 d                  US EDC: 04/17/2017 Subchorionic hemorrhage: No convincing evidence of a perigestational bleed. Maternal uterus/adnexae: Anteverted uterus. No uterine fibroids demonstrated. Right ovary measures 4.5 x 2.3 x 3.4 cm and contains a corpus luteum. Left ovary measures 3.4 x 2.0 x 2.0 cm. No abnormal ovarian or adnexal masses. No abnormal free fluid in the pelvis. IMPRESSION: 1. Living dichorionic diamniotic twin intrauterine gestation, with both embryos measuring 6 weeks 5 days by crown-rump length and with both embryos demonstrating normal embryonic cardiac activity. No acute early first-trimester gestational abnormality. No significant date discrepancy with the provided menstrual dating. 2. No ovarian or adnexal abnormality. Electronically Signed   By: Delbert PhenixJason A Poff M.D.   On: 08/27/2016 15:44   [place] Ob Transvaginal  Result Date: 08/27/2016 CLINICAL  DATA:  36 year old pregnant female with abdominal pain. EDC by LMP: 04/14/2017, projecting to an expected gestational age of [redacted] weeks 1 day. EXAM: TWIN OBSTETRIC <14WK US AND TRANSVAGINAL OB US COMPARISON:  No prior scans from this gestation. FINDINGS: Number of IUPs:  2 Chorionicity/Amnionicity:  Dichorionic-diamniotic (thick membrane) TWIN A (maternal right side) Yolk sac:  Visualized. Embryo:  Visualized. Embryonic Cardiac Activity: Regular rate and rhythm. Embryonic Heart Rate: 146 bpm CRL:  8.0  mm   6 w 5 d                  US EDC: 04/17/2017 TWIN B (maternal left side) Yolk sac:  Visualized. Embryo:  Visualized. Embryonic Cardiac Activity: Regular rate and rhythm. Embryonic Heart Rate: 150 bpm CRL:  8.8  mm   6 w 5 d                  US EDC: 04/17/2017 Subchorionic hemorrhage: No convincing evidence of a perigestational bleed. Maternal uterus/adnexae: Anteverted uterus. No uterine fibroids demonstrated. Right ovary measures 4.5 x 2.3 x 3.4 cm and contains a corpus luteum. Left ovary measures 3.4 x 2.0 x 2.0 cm. No abnormal ovarian or adnexal masses. No abnormal free fluid in the pelvis. IMPRESSION: 1. Living dichorionic diamniotic twin intrauterine gestation, with both embryos measuring 6 weeks 5 days by crown-rump length and with both embryos demonstrating normal embryonic cardiac activity. No acute early first-trimester gestational abnormality. No significant date discrepancy with the provided menstrual dating. 2. No ovarian or adnexal abnormality. Electronically Signed   By: Delbert PhenixJason A Poff M.D.   On: 08/27/2016 15:44     MDM +UPT UA, wet prep, GC/chlamydia, CBC, ABO/Rh, quant hCG, HIV, and US today to rule out ectopic pregnancy Ultrasound shows di/di twin IUP with cardiac activity Wet prep + clues, will tx for BV Assessment and Plan  A; 1. Dichorionic diamniotic twin pregnancy in first trimester   2. Abdominal pain affecting pregnancy   3. Nausea and vomiting during pregnancy prior to [redacted] weeks  gestation  4. BV (bacterial vaginosis)    P: Discharge home Rx metrogel, phenergan, PNV Discussed reasons to return to MAU Start prenatal care  GC/CT pending   Judeth Horn 08/27/2016, 1:22 PM

## 2016-08-27 NOTE — Discharge Instructions (Signed)
Multiple Pregnancy Having a multiple pregnancy means that a woman is carrying more than one baby at a time. She may be pregnant with twins, triplets, or more. The majority of multiple pregnancies are twins. Naturally conceiving triplets or more (higher-order multiples) is rare. Multiple pregnancies are riskier than single pregnancies. A woman with a multiple pregnancy is more likely to have certain problems during her pregnancy. Therefore, she will need to have more frequent appointments for prenatal care. How does a multiple pregnancy happen? A multiple pregnancy happens when:  The woman's body releases more than one egg at a time, and then each egg gets fertilized by a different sperm.  This is the most common type of multiple pregnancy.  Twins or other multiples produced this way are fraternal. They are no more alike than non-multiple siblings are.  One sperm fertilizes one egg, which then divides into more than one embryo.  Twins or other multiples produced this way are identical. Identical multiples are always the same gender, and they look very much alike. Who is most likely to have a multiple pregnancy? A multiple pregnancy is more likely to develop in women who:  Have had fertility treatment, especially if the treatment included fertility drugs.  Are older than 37 years of age.  Have already had four or more children.  Have a family history of multiple pregnancy. How is a multiple pregnancy diagnosed? A multiple pregnancy may be diagnosed based on:  Symptoms such as:  Rapid weight gain in the first 3 months of pregnancy (first trimester).  More severe nausea and breast tenderness than what is typical of a single pregnancy.  The uterus measuring larger than what is normal for the stage of the pregnancy.  Blood tests that detect a higher-than-normal level of human chorionic gonadotropin (hCG). This is a hormone that your body produces in early pregnancy.  Ultrasound exam.  This is used to confirm that you are carrying multiples. What risks are associated with multiple pregnancy? A multiple pregnancy puts you at a higher risk for certain problems during or after your pregnancy, including:  Having your babies delivered before you have reached a full-term pregnancy (preterm birth). A full-term pregnancy lasts for at least 37 weeks. Babies born before 37 weeks may have a higher risk of a variety of health problems, such as breathing problems, feeding difficulties, cerebral palsy, and learning disabilities.  Diabetes.  Preeclampsia. This is a serious condition that causes high blood pressure along with other symptoms, such as swelling and headaches, during pregnancy.  Excessive blood loss after childbirth (postpartum hemorrhage).  Postpartum depression.  Low birth weight of the babies. How will having a multiple pregnancy affect my care? Your health care provider will want to monitor you more closely during your pregnancy to make sure that your babies are growing normally and that you are healthy. Follow these instructions at home: Because your pregnancy is considered to be high risk, you will need to work closely with your health care team. You may also need to make some lifestyle changes. These may include the following: Eating and drinking   Increase your nutrition.  Follow your health care provider's recommendations for weight gain. You may need to gain a little extra weight when you are pregnant with multiples.  Eat healthy snacks often throughout the day. This can add calories and reduce nausea.  Drink enough fluid to keep your urine clear or pale yellow.  Take prenatal vitamins. Activity  By 20-24 weeks, you may need to   limit your activities.  Avoid activities and work that take a lot of effort (are strenuous).  Ask your health care provider when you should stop having sexual intercourse.  Rest often. General instructions   Do not use any  products that contain nicotine or tobacco, such as cigarettes and e-cigarettes. If you need help quitting, ask your health care provider.  Do not drink alcohol or use illegal drugs.  Take over-the-counter and prescription medicines only as told by your health care provider.  Arrange for extra help around the house.  Keep all follow-up visits and all prenatal visits as told by your health care provider. This is important. Contact a health care provider if:  You have dizziness.  You have persistent nausea, vomiting, or diarrhea.  You are having trouble gaining weight.  You have feelings of depression or other emotions that are interfering with your normal activities. Get help right away if:  You have a fever.  You have pain with urination.  You have fluid leaking from your vagina.  You have a bad-smelling vaginal discharge.  You notice increased swelling in your face, hands, legs, or ankles.  You have spotting or bleeding from your vagina.  You have pelvic cramps, pelvic pressure, or nagging pain in your abdomen or lower back.  You are having regular contractions.  You develop a severe headache, with or without visual changes.  You have shortness of breath or chest pain.  You notice less fetal movement, or no fetal movement. This information is not intended to replace advice given to you by your health care provider. Make sure you discuss any questions you have with your health care provider. Document Released: 01/02/2008 Document Revised: 11/24/2015 Document Reviewed: 11/24/2015 Elsevier Interactive Patient Education  2017 [employer]  

## 2016-08-28 ENCOUNTER — Other Ambulatory Visit (HOSPITAL_COMMUNITY): Payer: Self-pay | Admitting: Student

## 2016-08-28 ENCOUNTER — Encounter: Payer: Self-pay | Admitting: Obstetrics and Gynecology

## 2016-08-28 DIAGNOSIS — O26899 Other specified pregnancy related conditions, unspecified trimester: Secondary | ICD-10-CM

## 2016-08-28 DIAGNOSIS — R109 Unspecified abdominal pain: Principal | ICD-10-CM

## 2016-08-28 LAB — GC/CHLAMYDIA PROBE AMP (~~LOC~~) NOT AT ARMC
CHLAMYDIA, DNA PROBE: NEGATIVE
NEISSERIA GONORRHEA: NEGATIVE

## 2016-08-28 LAB — HIV ANTIBODY (ROUTINE TESTING W REFLEX): HIV Screen 4th Generation wRfx: NONREACTIVE

## 2016-09-06 NOTE — Addendum Note (Signed)
Addendum  created 09/06/16 0843 by Oluwademilade Mckiver D, MD   Sign clinical note    

## 2016-09-09 ENCOUNTER — Encounter: Payer: Self-pay | Admitting: Obstetrics & Gynecology

## 2016-09-19 ENCOUNTER — Ambulatory Visit (INDEPENDENT_AMBULATORY_CARE_PROVIDER_SITE_OTHER): Payer: Medicaid Other | Admitting: Obstetrics and Gynecology

## 2016-09-19 ENCOUNTER — Encounter (HOSPITAL_COMMUNITY): Payer: Self-pay

## 2016-09-19 ENCOUNTER — Other Ambulatory Visit: Payer: Self-pay | Admitting: Obstetrics and Gynecology

## 2016-09-19 ENCOUNTER — Ambulatory Visit (HOSPITAL_COMMUNITY)
Admission: RE | Admit: 2016-09-19 | Discharge: 2016-09-19 | Disposition: A | Discharge status: Home / Self Care | Payer: Medicaid Other | Source: Ambulatory Visit | Attending: Obstetrics and Gynecology | Admitting: Obstetrics and Gynecology

## 2016-09-19 ENCOUNTER — Other Ambulatory Visit (HOSPITAL_COMMUNITY)
Admission: RE | Admit: 2016-09-19 | Discharge: 2016-09-19 | Disposition: A | Discharge status: Home / Self Care | Payer: Medicaid Other | Source: Ambulatory Visit | Attending: Obstetrics and Gynecology | Admitting: Obstetrics and Gynecology

## 2016-09-19 ENCOUNTER — Inpatient Hospital Stay (HOSPITAL_COMMUNITY)
Admission: AD | Admit: 2016-09-19 | Discharge: 2016-09-19 | Disposition: A | Discharge status: Home / Self Care | Payer: Medicaid Other | Source: Ambulatory Visit | Attending: Obstetrics & Gynecology | Admitting: Obstetrics & Gynecology

## 2016-09-19 ENCOUNTER — Encounter (HOSPITAL_COMMUNITY): Payer: Self-pay | Admitting: *Deleted

## 2016-09-19 ENCOUNTER — Encounter: Payer: Self-pay | Admitting: Obstetrics and Gynecology

## 2016-09-19 VITALS — BP 143/77 | HR 74 | Wt 151.0 lb

## 2016-09-19 DIAGNOSIS — Z8619 Personal history of other infectious and parasitic diseases: Secondary | ICD-10-CM

## 2016-09-19 DIAGNOSIS — F209 Schizophrenia, unspecified: Secondary | ICD-10-CM | POA: Insufficient documentation

## 2016-09-19 DIAGNOSIS — O99011 Anemia complicating pregnancy, first trimester: Secondary | ICD-10-CM

## 2016-09-19 DIAGNOSIS — L731 Pseudofolliculitis barbae: Secondary | ICD-10-CM

## 2016-09-19 DIAGNOSIS — F319 Bipolar disorder, unspecified: Secondary | ICD-10-CM | POA: Insufficient documentation

## 2016-09-19 DIAGNOSIS — O9989 Other specified diseases and conditions complicating pregnancy, childbirth and the puerperium: Secondary | ICD-10-CM | POA: Diagnosis not present

## 2016-09-19 DIAGNOSIS — O200 Threatened abortion: Secondary | ICD-10-CM

## 2016-09-19 DIAGNOSIS — O0991 Supervision of high risk pregnancy, unspecified, first trimester: Secondary | ICD-10-CM

## 2016-09-19 DIAGNOSIS — O30041 Twin pregnancy, dichorionic/diamniotic, first trimester: Secondary | ICD-10-CM

## 2016-09-19 DIAGNOSIS — O161 Unspecified maternal hypertension, first trimester: Secondary | ICD-10-CM | POA: Diagnosis not present

## 2016-09-19 DIAGNOSIS — D573 Sickle-cell trait: Secondary | ICD-10-CM

## 2016-09-19 DIAGNOSIS — Z720 Tobacco use: Secondary | ICD-10-CM

## 2016-09-19 DIAGNOSIS — N764 Abscess of vulva: Secondary | ICD-10-CM

## 2016-09-19 DIAGNOSIS — Z3A10 10 weeks gestation of pregnancy: Secondary | ICD-10-CM | POA: Insufficient documentation

## 2016-09-19 DIAGNOSIS — Z87891 Personal history of nicotine dependence: Secondary | ICD-10-CM | POA: Diagnosis not present

## 2016-09-19 DIAGNOSIS — O30049 Twin pregnancy, dichorionic/diamniotic, unspecified trimester: Secondary | ICD-10-CM | POA: Insufficient documentation

## 2016-09-19 DIAGNOSIS — O209 Hemorrhage in early pregnancy, unspecified: Secondary | ICD-10-CM

## 2016-09-19 DIAGNOSIS — O0990 Supervision of high risk pregnancy, unspecified, unspecified trimester: Secondary | ICD-10-CM

## 2016-09-19 DIAGNOSIS — O208 Other hemorrhage in early pregnancy: Principal | ICD-10-CM

## 2016-09-19 DIAGNOSIS — Z113 Encounter for screening for infections with a predominantly sexual mode of transmission: Secondary | ICD-10-CM | POA: Diagnosis not present

## 2016-09-19 DIAGNOSIS — I10 Essential (primary) hypertension: Secondary | ICD-10-CM

## 2016-09-19 DIAGNOSIS — O23591 Infection of other part of genital tract in pregnancy, first trimester: Secondary | ICD-10-CM | POA: Diagnosis not present

## 2016-09-19 DIAGNOSIS — O99341 Other mental disorders complicating pregnancy, first trimester: Secondary | ICD-10-CM | POA: Diagnosis not present

## 2016-09-19 DIAGNOSIS — O09521 Supervision of elderly multigravida, first trimester: Secondary | ICD-10-CM | POA: Diagnosis not present

## 2016-09-19 DIAGNOSIS — G8929 Other chronic pain: Secondary | ICD-10-CM | POA: Diagnosis not present

## 2016-09-19 DIAGNOSIS — O09891 Supervision of other high risk pregnancies, first trimester: Secondary | ICD-10-CM | POA: Insufficient documentation

## 2016-09-19 DIAGNOSIS — R109 Unspecified abdominal pain: Secondary | ICD-10-CM | POA: Diagnosis not present

## 2016-09-19 DIAGNOSIS — O09522 Supervision of elderly multigravida, second trimester: Secondary | ICD-10-CM | POA: Insufficient documentation

## 2016-09-19 DIAGNOSIS — Z641 Problems related to multiparity: Secondary | ICD-10-CM | POA: Insufficient documentation

## 2016-09-19 DIAGNOSIS — N854 Malposition of uterus: Secondary | ICD-10-CM | POA: Diagnosis not present

## 2016-09-19 LAB — POCT URINALYSIS DIP (DEVICE)
Bilirubin Urine: NEGATIVE
Glucose, UA: NEGATIVE mg/dL
Ketones, ur: NEGATIVE mg/dL
Leukocytes, UA: NEGATIVE
Nitrite: NEGATIVE
PH: 6 (ref 5.0–8.0)
Protein, ur: 30 mg/dL — AB
Specific Gravity, Urine: 1.025 (ref 1.005–1.030)
Urobilinogen, UA: 1 mg/dL (ref 0.0–1.0)

## 2016-09-19 LAB — URINALYSIS, ROUTINE W REFLEX MICROSCOPIC
Bilirubin Urine: NEGATIVE
GLUCOSE, UA: NEGATIVE mg/dL
Ketones, ur: NEGATIVE mg/dL
Leukocytes, UA: NEGATIVE
Nitrite: NEGATIVE
Protein, ur: NEGATIVE mg/dL
Specific Gravity, Urine: >1.03 — ABNORMAL HIGH (ref 1.005–1.030)
pH: 6 (ref 5.0–8.0)

## 2016-09-19 LAB — URINALYSIS, MICROSCOPIC (REFLEX)

## 2016-09-19 MED ORDER — PROMETHAZINE HCL 25 MG PO TABS: 25.0000 mg | ORAL_TABLET | Freq: Four times a day (QID) | ORAL | 0 refills | Status: DC | PRN

## 2016-09-19 NOTE — MAU Note (Addendum)
[facility] and has has US appt this am at 1030.  Patient presents to mau with c/o  "boil" on vaginal labia +lower abdominal pain, sharp and pulling and constant Has not taken anything for pain today +vaginal bleeding; dark red in color Patient states"i feel like I was dismissed earlier and not being heard about my concerns".

## 2016-09-19 NOTE — MAU Provider Note (Signed)
Chief Complaint: Abdominal Pain and boil   First Provider Initiated Contact with Patient 09/19/16 0857        SUBJECTIVE HPI: [name] is a 36 y.o. [id number] at [redacted]w[redacted]d by LMP who presents to maternity admissions reporting vulvar lesion which is painful.  Also worried she is having a miscarriage due to bleeding.Marland Kitchen  Has an [place] appointment in about an hour from now. She denies vaginal bleeding, vaginal itching/burning, urinary symptoms, h/a, dizziness, n/v, or fever/chills.     Abdominal Pain  This is a recurrent problem. The current episode started in the past 7 days. The onset quality is gradual. The problem occurs intermittently. The problem has been unchanged. The pain is located in the suprapubic region. The pain is mild. The quality of the pain is cramping. Pertinent negatives include no constipation, diarrhea, fever, headaches, myalgias, nausea or vomiting. Associated symptoms comments: Vaginal bleeding Pain on vulvar lesion . Nothing aggravates the pain. The pain is relieved by nothing. She has tried nothing for the symptoms.   RN Note: Seen in clinic at 8am for initial [facility] appt and has has [place] appt this am at 1030.  Patient presents to mau with c/o  "boil" on vaginal labia +lower abdominal pain, sharp and pulling and constant Has not taken anything for pain today +vaginal bleeding; dark red in color Patient states"i feel like I was dismissed earlier and not being heard about my concerns".    Electronically signed by Randa Evens, Valentina Shaggy, RN at 09/19/2016 8:57 AM Electronically signed by Randa Evens, Valentina Shaggy, RN at 09/19/2016 9:05 AM      Past Medical History:  Diagnosis Date  . Anemia   . Bipolar 1 disorder (HCC)   . Chlamydia   . Chronic abdominal pain   . Chronic nausea   . Deliberate self-cutting   . HSV-2 infection 2015  . Hypertension   . Infection    MRSA in 1995, negative since  . Schizophrenia (HCC)   . Seizures (HCC)    Not recently  .  Sickle cell trait (HCC)   . Trichomonas infection    Past Surgical History:  Procedure Laterality Date  . DILATION AND CURETTAGE OF UTERUS    . HEMORROIDECTOMY  2010  . plastic surgery on face     Social History   Social History  . Marital status: Legally Separated    Spouse name: N/A  . Number of children: N/A  . Years of education: N/A   Occupational History  . Not on file.   Social History Main Topics  . Smoking status: Former Smoker    Packs/day: 0.50    Years: 1.50    Types: Cigarettes    Quit date: 04/08/2016  . Smokeless tobacco: Never Used  . Alcohol use No     Comment: hx drug use  . Drug use: Yes    Types: Marijuana, Cocaine     Comment: past use  . Sexual activity: Not Currently    Birth control/ protection: None     Comment: desires Depo Provera   Other Topics Concern  . Not on file   Social History Narrative  . No narrative on file   No current facility-administered medications on file prior to encounter.    Current Outpatient Prescriptions on File Prior to Encounter  Medication Sig Dispense Refill  . Prenatal Vit-Fe Fumarate-FA (PREPLUS) 27-1 MG TABS Take 1 tablet by mouth daily. 30 tablet 11  . promethazine (PHENERGAN) 25 MG tablet Take 1  tablet (25 mg total) by mouth every 6 (six) hours as needed for nausea or vomiting. 30 tablet 0   Allergies  Allergen Reactions  . Peanut-Containing Drug Products Anaphylaxis, Itching and Rash  . Amoxicillin Hives, Swelling and Other (See Comments)    Has patient had a PCN reaction causing immediate rash, facial/tongue/throat swelling, SOB or lightheadedness with hypotension: No Has patient had a PCN reaction causing severe rash involving mucus membranes or skin necrosis: No Has patient had a PCN reaction that required hospitalization No Has patient had a PCN reaction occurring within the last 10 years: Yes If all of the above answers are "NO", then may proceed with Cephalosporin use.  . Latex Hives and Itching     I have reviewed patient's Past Medical Hx, Surgical Hx, Family Hx, Social Hx, medications and allergies.   ROS:  Review of Systems  Constitutional: Negative for fever.  Gastrointestinal: Positive for abdominal pain. Negative for constipation, diarrhea, nausea and vomiting.  Genitourinary:       Painful vulvar lesion   Musculoskeletal: Negative for myalgias.  Neurological: Negative for headaches.   Review of Systems  Other systems negative   Physical Exam  Physical Exam Patient Vitals for the past 24 hrs:  BP Temp Temp src Pulse Resp SpO2  09/19/16 0850 (!) 149/72 98.2 F (36.8 C) Oral 84 16 100 %   Constitutional: Well-developed, well-nourished female in no acute distress.  Cardiovascular: normal rate Respiratory: normal effort GI: Abd soft, non-tender. Pos BS x 4 MS: Extremities nontender, no edema, normal ROM Neurologic: Alert and oriented x 4.  GU: Neg CVAT.  PELVIC EXAM:  There is a pea sized furuncle on left vulva There is slight pointing Area is indurated but not fluctuant, not amenable for incision  LAB RESULTS Results for orders placed or performed during the hospital encounter of 09/19/16 (from the past 24 hour(s))  Urinalysis, Routine w reflex microscopic     Status: Abnormal   Collection Time: 09/19/16  8:40 AM  Result Value Ref Range   Color, Urine YELLOW YELLOW   APPearance CLEAR CLEAR   Specific Gravity, Urine >1.030 (H) 1.005 - 1.030   pH 6.0 5.0 - 8.0   Glucose, UA NEGATIVE NEGATIVE mg/dL   Hgb urine dipstick TRACE (A) NEGATIVE   Bilirubin Urine NEGATIVE NEGATIVE   Ketones, ur NEGATIVE NEGATIVE mg/dL   Protein, ur NEGATIVE NEGATIVE mg/dL   Nitrite NEGATIVE NEGATIVE   Leukocytes, UA NEGATIVE NEGATIVE  Urinalysis, Microscopic (reflex)     Status: Abnormal   Collection Time: 09/19/16  8:40 AM  Result Value Ref Range   RBC / HPF 0-5 0 - 5 RBC/hpf   WBC, UA 0-5 0 - 5 WBC/hpf   Bacteria, UA FEW (A) NONE SEEN   Squamous Epithelial / LPF 0-5 (A)  NONE SEEN   Urine-Other MUCOUS PRESENT     --/--/A POS (01/11 1401)  IMAGING [place] Ob Comp Less 14 Wks  Result Date: 08/28/2016 CLINICAL DATA:  36 year old pregnant female with abdominal pain. EDC by LMP: 04/14/2017, projecting to an expected gestational age of [redacted] weeks 1 day. EXAM: TWIN OBSTETRIC <14WK US AND TRANSVAGINAL OB US COMPARISON:  No prior scans from this gestation. FINDINGS: Number of IUPs:  2 Chorionicity/Amnionicity:  Dichorionic-diamniotic (thick membrane) TWIN A (maternal right side) Yolk sac:  Visualized. Embryo:  Visualized. Embryonic Cardiac Activity: Regular rate and rhythm. Embryonic Heart Rate: 146 bpm CRL:  8.0  mm   6 w 5 d  [place] EDC: 04/17/2017 TWIN B (maternal left side) Yolk sac:  Visualized. Embryo:  Visualized. Embryonic Cardiac Activity: Regular rate and rhythm. Embryonic Heart Rate: 150 bpm CRL:  8.8  mm   6 w 5 d                  [place] EDC: 04/17/2017 Subchorionic hemorrhage: No convincing evidence of a perigestational bleed. Maternal uterus/adnexae: Anteverted uterus. No uterine fibroids demonstrated. Right ovary measures 4.5 x 2.3 x 3.4 cm and contains a corpus luteum. Left ovary measures 3.4 x 2.0 x 2.0 cm. No abnormal ovarian or adnexal masses. No abnormal free fluid in the pelvis. IMPRESSION: 1. Living dichorionic diamniotic twin intrauterine gestation, with both embryos measuring 6 weeks 5 days by crown-rump length and with both embryos demonstrating normal embryonic cardiac activity. No acute early first-trimester gestational abnormality. No significant date discrepancy with the provided menstrual dating. 2. No ovarian or adnexal abnormality. Electronically Signed   By: Delbert Phenix M.D.   On: 08/27/2016 15:44   US Ob Comp Addl Gest Less 14 Wks  Result Date: 08/27/2016 CLINICAL DATA:  36 year old pregnant female with abdominal pain. EDC by LMP: 04/14/2017, projecting to an expected gestational age of [redacted] weeks 1 day. EXAM: TWIN OBSTETRIC <14WK [place] AND  TRANSVAGINAL OB US COMPARISON:  No prior scans from this gestation. FINDINGS: Number of IUPs:  2 Chorionicity/Amnionicity:  Dichorionic-diamniotic (thick membrane) TWIN A (maternal right side) Yolk sac:  Visualized. Embryo:  Visualized. Embryonic Cardiac Activity: Regular rate and rhythm. Embryonic Heart Rate: 146 bpm CRL:  8.0  mm   6 w 5 d                  [place] EDC: 04/17/2017 TWIN B (maternal left side) Yolk sac:  Visualized. Embryo:  Visualized. Embryonic Cardiac Activity: Regular rate and rhythm. Embryonic Heart Rate: 150 bpm CRL:  8.8  mm   6 w 5 d                  [place] EDC: 04/17/2017 Subchorionic hemorrhage: No convincing evidence of a perigestational bleed. Maternal uterus/adnexae: Anteverted uterus. No uterine fibroids demonstrated. Right ovary measures 4.5 x 2.3 x 3.4 cm and contains a corpus luteum. Left ovary measures 3.4 x 2.0 x 2.0 cm. No abnormal ovarian or adnexal masses. No abnormal free fluid in the pelvis. IMPRESSION: 1. Living dichorionic diamniotic twin intrauterine gestation, with both embryos measuring 6 weeks 5 days by crown-rump length and with both embryos demonstrating normal embryonic cardiac activity. No acute early first-trimester gestational abnormality. No significant date discrepancy with the provided menstrual dating. 2. No ovarian or adnexal abnormality. Electronically Signed   By: Delbert Phenix M.D.   On: 08/27/2016 15:44   US Ob Transvaginal  Result Date: 08/27/2016 CLINICAL DATA:  36 year old pregnant female with abdominal pain. EDC by LMP: 04/14/2017, projecting to an expected gestational age of [redacted] weeks 1 day. EXAM: TWIN OBSTETRIC <14WK [place] AND TRANSVAGINAL OB US COMPARISON:  No prior scans from this gestation. FINDINGS: Number of IUPs:  2 Chorionicity/Amnionicity:  Dichorionic-diamniotic (thick membrane) TWIN A (maternal right side) Yolk sac:  Visualized. Embryo:  Visualized. Embryonic Cardiac Activity: Regular rate and rhythm. Embryonic Heart Rate: 146 bpm CRL:  8.0  mm   6 w  5 d                  [place] EDC: 04/17/2017 TWIN B (maternal left side) Yolk sac:  Visualized. Embryo:  Visualized. Embryonic Cardiac Activity: Regular  rate and rhythm. Embryonic Heart Rate: 150 bpm CRL:  8.8  mm   6 w 5 d                  [place] EDC: 04/17/2017 Subchorionic hemorrhage: No convincing evidence of a perigestational bleed. Maternal uterus/adnexae: Anteverted uterus. No uterine fibroids demonstrated. Right ovary measures 4.5 x 2.3 x 3.4 cm and contains a corpus luteum. Left ovary measures 3.4 x 2.0 x 2.0 cm. No abnormal ovarian or adnexal masses. No abnormal free fluid in the pelvis. IMPRESSION: 1. Living dichorionic diamniotic twin intrauterine gestation, with both embryos measuring 6 weeks 5 days by crown-rump length and with both embryos demonstrating normal embryonic cardiac activity. No acute early first-trimester gestational abnormality. No significant date discrepancy with the provided menstrual dating. 2. No ovarian or adnexal abnormality. Electronically Signed   By: Delbert Phenix M.D.   On: 08/27/2016 15:44    MAU Management/MDM: I discussed with her the natural course for furuncles and abscesses Discussed I agree with previous provider and also recommend warm compresses Bedside US done briefly to reassure her babies are alive Two fetuses visualized with dividing membrane, both active with FHR about 160s   ASSESSMENT Twin IUP at [redacted]w[redacted]d Vulvar furuncle  PLAN Discharge home Warm compresses to lesion Keep appt in [place] today  Pt stable at time of discharge. Encouraged to return here or to other Urgent Care/ED if she develops worsening of symptoms, increase in pain, fever, or other concerning symptoms.    [name] CNM, MSN Certified Nurse-Midwife 09/19/2016  9:15 AM

## 2016-09-19 NOTE — Progress Notes (Signed)
New OB Note  09/19/2016   [facility]  Chief Complaint: NOB  Transfer of Care Patient: no  History of Present Illness: [name] is a 36 y.o. [id number] @ 10/3 weeks (EDC 1/10, based on Patient's last menstrual period was 07/08/2016.=6wk u/s).  Preg complicated by has Tobacco abuse; Bipolar disorder (HCC); History of substance abuse; Sickle cell trait (HCC); Chronic hypertension; Supervision of high risk pregnancy, antepartum; History of herpes genitalis; History of trichomoniasis; AMA (advanced maternal age) multigravida 35+, first trimester; Grand multiparity; Short interval between pregnancies affecting pregnancy in first trimester, antepartum; and Dichorionic diamniotic twin gestation on her problem list.   Felt tired yesterday so called EMS and she had a BS in the 60s and BPs in the 200s. They recommended she go to the hospital but she refused.   Started having some VB this AM and passing some clots and some cramping  On no meds when she found out she was pregnant [redacted]wks ago  ROS: A 12-point review of systems was performed and negative, except as stated in the above HPI.  OBGYN History: As per HPI. OB History  Gravida Para Term Preterm AB Living  15 8 8  0 6 8  SAB TAB Ectopic Multiple Live Births  6 0 0 0 8    # Outcome Date GA Lbr Len/2nd Weight Sex Delivery Anes PTL Lv  15 Current           14 Term 04/18/16 7343w4d 05:54 / 00:05 6 lb 6.8 oz (2.915 kg) M Vag-Spont EPI  LIV  13 Term 03/23/15 7322w0d 01:16 / 01:49 6 lb 8.1 oz (2.95 kg) M Vag-Spont EPI  LIV     Birth Comments: Neonatal jaundice, required phototherapy  12 Term 11/28/13 5462w1d 17:02 / 00:14  M Vag-Spont EPI  LIV  11 Term 04/28/11 3632w3d 07:06 / 00:12 7 lb (3.175 kg) M Vag-Spont EPI  LIV     Birth Comments: wnl  10 Term 2011 6869w0d  7 lb 9 oz (3.43 kg) F Vag-Spont   LIV  9 Term 2003   7 lb 13 oz (3.544 kg) F Vag-Spont   LIV  8 SAB           7 SAB           6 SAB           5 SAB            4 SAB           3 SAB           2 Term    6 lb 13 oz (3.09 kg) F Vag-Spont   LIV  1 Term    7 lb 13 oz (3.544 kg) F Vag-Spont   LIV     [facility]: [facility]   Past Medical History: Past Medical History:  Diagnosis Date  . Anemia   . Bipolar 1 disorder (HCC)   . Chlamydia   . Chronic abdominal pain   . Chronic nausea   . Deliberate self-cutting   . HSV-2 infection 2015  . Hypertension   . Infection    MRSA in 1995, negative since  . Schizophrenia (HCC)   . Seizures (HCC)    Not recently  . Sickle cell trait (HCC)   . Trichomonas infection     Past Surgical History: Past Surgical History:  Procedure Laterality Date  . DILATION AND CURETTAGE OF UTERUS    . HEMORROIDECTOMY  2010  . plastic surgery on face      Family History:  Family History  Problem Relation Age of Onset  . Cancer Mother   . Heart failure Mother   . Hypertension Mother   . Stroke Mother   . Diabetes Mother   . Hypertension Maternal Grandmother   . Anesthesia problems Neg Hx    Social History:  Social History   Social History  . Marital status: Legally Separated    Spouse name: N/A  . Number of children: N/A  . Years of education: N/A   Occupational History  . Not on file.   Social History Main Topics  . Smoking status: Former Smoker    Packs/day: 0.50    Years: 1.50    Types: Cigarettes    Quit date: 04/08/2016  . Smokeless tobacco: Never Used  . Alcohol use No     Comment: hx drug use  . Drug use: Yes    Types: Marijuana, Cocaine     Comment: past use  . Sexual activity: Not Currently    Birth control/ protection: None     Comment: desires Depo Provera   Other Topics Concern  . Not on file   Social History Narrative  . No narrative on file     Allergy: Allergies  Allergen Reactions  . Peanut-Containing Drug Products Anaphylaxis, Itching and Rash  . Amoxicillin Hives, Swelling and Other (See Comments)    Has patient had a PCN reaction causing immediate  rash, facial/tongue/throat swelling, SOB or lightheadedness with hypotension: No Has patient had a PCN reaction causing severe rash involving mucus membranes or skin necrosis: No Has patient had a PCN reaction that required hospitalization No Has patient had a PCN reaction occurring within the last 10 years: Yes If all of the above answers are "NO", then may proceed with Cephalosporin use.  . Latex Hives and Itching    Health Maintenance:  Mammogram Up to Date: not applicable  Current Outpatient Medications: PNV  Physical Exam:  BP (!) 143/77   Pulse 74   Wt 151 lb (68.5 kg)   LMP 07/08/2016   BMI 26.75 kg/m  Body mass index is 26.75 kg/m.    Marland Kitchen  General appearance: Well nourished, well developed female in no acute distress.  Neck:  Supple, normal appearance, and no thyromegaly  Cardiovascular: S1, S2 normal, no murmur, rub or gallop, regular rate and rhythm Respiratory:  Clear to auscultation bilateral. Normal respiratory effort Abdomen: positive bowel sounds and no masses, hernias; diffusely non tender to palpation, non distended Breasts: normal exam 06/2016 Neuro/Psych:  Normal mood and affect.  Skin:  Warm and dry.  Lymphatic:  No inguinal lymphadenopathy.   Pelvic exam: is not limited by body habitus EGBUS: normal except for 1x1cm blocked hair follicle (not infected and minimally ttp) at the 4 o'clock position on the right labia Vagina: minimal old blood in the vault Cervix: normal, no active bleeding  Laboratory: None A pos  Imaging:  No new imaging.   Assessment: Pt stable  Plan: 1. Supervision of high risk pregnancy in first trimester Routine care - ToxASSURE Select 13 (MW), Urine - Comprehensive metabolic panel - Protein / Creatinine Ratio, Urine - Hemoglobin A1c - TSH - Cervicovaginal ancillary only - [place] MFM OB Transvaginal; Future - Hepatitis C Antibody - Urine Culture - Obstetric Panel, Including HIV  2. Threatened abortion in early  pregnancy U/s ordered for today. A POS - [place] MFM OB Transvaginal; Future  3. Supervision  of high risk pregnancy, antepartum See above  4. Chronic hypertension On no meds. F/u next week and consider starting meds. Doesn't look like she was on any last pregnancy. Baseline labs today. Start baby ASA at 12-14wks if not having anymore bleeding.   5. Tobacco abuse Counseled about risks and with substance abuse  6. Ingrown hair Continue to follow  7. History of herpes genitalis ppx in third trimester.   8. AMA D/w pt re: genetics nv.   9. Short interval pregnancy, grand multip High risk for PPH  10. [place] trait qtrimester ucx, ask about FOB status nv  11. Psych (h/o bipolar and schizophrenia, h/o self harm) No current s/s. Continue to follow. Ask Asher Muir to see at nv.   12. History of substance abuse UDS today. Pt counseled.   Problem list reviewed and updated.  Follow up in 1 weeks.  >50% of 25 min visit spent on counseling and coordination of care.     Cornelia Copa MD Attending Center for [facility])

## 2016-09-19 NOTE — MAU Provider Note (Signed)
Chief Complaint:  No chief complaint on file.  [name] is  36 y.o. [id number].  Patient's last menstrual period was 07/08/2016. Her pregnancy status is positive.  She presents with a painful boil on her labia for 2 days. She has been applying warm compresses, and notes it has been increasing in size. She also complains of sharp, diffuse lower abdominal pain that began yesterday and has worsened this morning. She noticed vaginal bleeding this AM when she used the bathroom. She is concerned about a miscarriage, given her history. Patient denies current nausea/vomiting, constipation, or diarrhea. She endorses a mild headache and occasional dizziness. Patient was seen in clinic this morning, and came to MAU for a second opinion on her presentation.   Obstetrical/Gynecological History: [id number] Miscarriages in 1st and 2nd trimesters ("early" and "3-4 months") Hx HSV2   Past Medical History: Past Medical History:  Diagnosis Date  . Anemia   . Bipolar 1 disorder (HCC)   . Chlamydia   . Chronic abdominal pain   . Chronic nausea   . Deliberate self-cutting   . HSV-2 infection 2015  . Hypertension   . Infection    MRSA in 1995, negative since  . Schizophrenia (HCC)   . Seizures (HCC)    Not recently  . Sickle cell trait (HCC)   . Trichomonas infection    Past Surgical History: Past Surgical History:  Procedure Laterality Date  . DILATION AND CURETTAGE OF UTERUS    . HEMORROIDECTOMY  2010  . plastic surgery on face     Family History: Family History  Problem Relation Age of Onset  . Cancer Mother   . Heart failure Mother   . Hypertension Mother   . Stroke Mother   . Diabetes Mother   . Hypertension Maternal Grandmother   . Anesthesia problems Neg Hx    Social History: Social History  Substance Use Topics  . Smoking status: Former Smoker    Packs/day: 0.50    Years: 1.50    Types: Cigarettes    Quit date: 04/08/2016  . Smokeless tobacco: Never Used  . Alcohol use No     Comment: hx drug use   Allergies:  Allergies  Allergen Reactions  . Peanut-Containing Drug Products Anaphylaxis, Itching and Rash  . Amoxicillin Hives, Swelling and Other (See Comments)    Has patient had a PCN reaction causing immediate rash, facial/tongue/throat swelling, SOB or lightheadedness with hypotension: No Has patient had a PCN reaction causing severe rash involving mucus membranes or skin necrosis: No Has patient had a PCN reaction that required hospitalization No Has patient had a PCN reaction occurring within the last 10 years: Yes If all of the above answers are "NO", then may proceed with Cephalosporin use.  . Latex Hives and Itching    Prescriptions Prior to Admission  Medication Sig Dispense Refill Last Dose  . Prenatal Vit-Fe Fumarate-FA (PREPLUS) 27-1 MG TABS Take 1 tablet by mouth daily. 30 tablet 11   . promethazine (PHENERGAN) 25 MG tablet Take 1 tablet (25 mg total) by mouth every 6 (six) hours as needed for nausea or vomiting. 30 tablet 0     Review of Systems (+) abdominal pain, headache, occasional dizziness, vaginal bleeding (-) constipation, diarrhea, nausea/vomiting   Physical Exam   LMP 07/08/2016   General: General appearance - alert, well appearing, and in no distress Chest - clear to auscultation Heart -RRR; normal S1, S2; no m/r/g  Abdomen - mildly tender to palpation, without guarding  Bedside US showed twin IUP   Focused Gynecological Exam: Normal external genitalia; small (< 1 cm) non-fluctuant blocked hair follicle noted on right labia  Labs: Results for orders placed or performed in visit on 09/19/16 (from the past 24 hour(s))  POCT urinalysis dip (device)   Collection Time: 09/19/16  8:27 AM  Result Value Ref Range   Glucose, UA NEGATIVE NEGATIVE mg/dL   Bilirubin Urine NEGATIVE NEGATIVE   Ketones, ur NEGATIVE NEGATIVE mg/dL   Specific Gravity, Urine 1.025 1.005 - 1.030   Hgb urine dipstick SMALL (A) NEGATIVE   pH 6.0 5.0 -  8.0   Protein, ur 30 (A) NEGATIVE mg/dL   Urobilinogen, UA 1.0 0.0 - 1.0 mg/dL   Nitrite NEGATIVE NEGATIVE   Leukocytes, UA NEGATIVE NEGATIVE   Imaging Studies:  US Ob Comp Less 14 Wks  Result Date: 08/28/2016 CLINICAL DATA:  36 year old pregnant female with abdominal pain. EDC by LMP: 04/14/2017, projecting to an expected gestational age of [redacted] weeks 1 day. EXAM: TWIN OBSTETRIC <14WK [place] AND TRANSVAGINAL OB US COMPARISON:  No prior scans from this gestation. FINDINGS: Number of IUPs:  2 Chorionicity/Amnionicity:  Dichorionic-diamniotic (thick membrane) TWIN A (maternal right side) Yolk sac:  Visualized. Embryo:  Visualized. Embryonic Cardiac Activity: Regular rate and rhythm. Embryonic Heart Rate: 146 bpm CRL:  8.0  mm   6 w 5 d                  [place] EDC: 04/17/2017 TWIN B (maternal left side) Yolk sac:  Visualized. Embryo:  Visualized. Embryonic Cardiac Activity: Regular rate and rhythm. Embryonic Heart Rate: 150 bpm CRL:  8.8  mm   6 w 5 d                  [place] EDC: 04/17/2017 Subchorionic hemorrhage: No convincing evidence of a perigestational bleed. Maternal uterus/adnexae: Anteverted uterus. No uterine fibroids demonstrated. Right ovary measures 4.5 x 2.3 x 3.4 cm and contains a corpus luteum. Left ovary measures 3.4 x 2.0 x 2.0 cm. No abnormal ovarian or adnexal masses. No abnormal free fluid in the pelvis. IMPRESSION: 1. Living dichorionic diamniotic twin intrauterine gestation, with both embryos measuring 6 weeks 5 days by crown-rump length and with both embryos demonstrating normal embryonic cardiac activity. No acute early first-trimester gestational abnormality. No significant date discrepancy with the provided menstrual dating. 2. No ovarian or adnexal abnormality. Electronically Signed   By: Delbert Phenix M.D.   On: 08/27/2016 15:44   US Ob Comp Addl Gest Less 14 Wks  Result Date: 08/27/2016 CLINICAL DATA:  36 year old pregnant female with abdominal pain. EDC by LMP: 04/14/2017, projecting to an  expected gestational age of [redacted] weeks 1 day. EXAM: TWIN OBSTETRIC <14WK [place] AND TRANSVAGINAL OB US COMPARISON:  No prior scans from this gestation. FINDINGS: Number of IUPs:  2 Chorionicity/Amnionicity:  Dichorionic-diamniotic (thick membrane) TWIN A (maternal right side) Yolk sac:  Visualized. Embryo:  Visualized. Embryonic Cardiac Activity: Regular rate and rhythm. Embryonic Heart Rate: 146 bpm CRL:  8.0  mm   6 w 5 d                  [place] EDC: 04/17/2017 TWIN B (maternal left side) Yolk sac:  Visualized. Embryo:  Visualized. Embryonic Cardiac Activity: Regular rate and rhythm. Embryonic Heart Rate: 150 bpm CRL:  8.8  mm   6 w 5 d  US EDC: 04/17/2017 Subchorionic hemorrhage: No convincing evidence of a perigestational bleed. Maternal uterus/adnexae: Anteverted uterus. No uterine fibroids demonstrated. Right ovary measures 4.5 x 2.3 x 3.4 cm and contains a corpus luteum. Left ovary measures 3.4 x 2.0 x 2.0 cm. No abnormal ovarian or adnexal masses. No abnormal free fluid in the pelvis. IMPRESSION: 1. Living dichorionic diamniotic twin intrauterine gestation, with both embryos measuring 6 weeks 5 days by crown-rump length and with both embryos demonstrating normal embryonic cardiac activity. No acute early first-trimester gestational abnormality. No significant date discrepancy with the provided menstrual dating. 2. No ovarian or adnexal abnormality. Electronically Signed   By: Delbert PhenixJason A Poff M.D.   On: 08/27/2016 15:44   [place] Ob Transvaginal  Result Date: 08/27/2016 CLINICAL DATA:  36 year old pregnant female with abdominal pain. EDC by LMP: 04/14/2017, projecting to an expected gestational age of [redacted] weeks 1 day. EXAM: TWIN OBSTETRIC <14WK US AND TRANSVAGINAL OB US COMPARISON:  No prior scans from this gestation. FINDINGS: Number of IUPs:  2 Chorionicity/Amnionicity:  Dichorionic-diamniotic (thick membrane) TWIN A (maternal right side) Yolk sac:  Visualized. Embryo:  Visualized. Embryonic Cardiac  Activity: Regular rate and rhythm. Embryonic Heart Rate: 146 bpm CRL:  8.0  mm   6 w 5 d                  US EDC: 04/17/2017 TWIN B (maternal left side) Yolk sac:  Visualized. Embryo:  Visualized. Embryonic Cardiac Activity: Regular rate and rhythm. Embryonic Heart Rate: 150 bpm CRL:  8.8  mm   6 w 5 d                  US EDC: 04/17/2017 Subchorionic hemorrhage: No convincing evidence of a perigestational bleed. Maternal uterus/adnexae: Anteverted uterus. No uterine fibroids demonstrated. Right ovary measures 4.5 x 2.3 x 3.4 cm and contains a corpus luteum. Left ovary measures 3.4 x 2.0 x 2.0 cm. No abnormal ovarian or adnexal masses. No abnormal free fluid in the pelvis. IMPRESSION: 1. Living dichorionic diamniotic twin intrauterine gestation, with both embryos measuring 6 weeks 5 days by crown-rump length and with both embryos demonstrating normal embryonic cardiac activity. No acute early first-trimester gestational abnormality. No significant date discrepancy with the provided menstrual dating. 2. No ovarian or adnexal abnormality. Electronically Signed   By: Delbert PhenixJason A Poff M.D.   On: 08/27/2016 15:44   Assessment: - Furuncle, labia majora  Plan: - Discharge patient in stable condition with return precautions for emergencies, worsening of condition  - Patient to follow up with MFM appointment/ ultrasound scheduled for later today   Thurnell LoseAlessandra G Finley Chevez, MS3

## 2016-09-19 NOTE — Discharge Instructions (Signed)
Skin Abscess A skin abscess is an infected area on or under your skin that contains pus and other material. An abscess can happen almost anywhere on your body. Some abscesses break open (rupture) on their own. Most continue to get worse unless they are treated. The infection can spread deeper into the body and into your blood, which can make you feel sick. Treatment usually involves draining the abscess. Follow these instructions at home: Abscess Care  If you have an abscess that has not drained, place a warm, clean, wet washcloth over the abscess several times a day. Do this as told by your doctor.  Follow instructions from your doctor about how to take care of your abscess. Make sure you: ? Cover the abscess with a bandage (dressing). ? Change your bandage or gauze as told by your doctor. ? Wash your hands with soap and water before you change the bandage or gauze. If you cannot use soap and water, use hand sanitizer.  Check your abscess every day for signs that the infection is getting worse. Check for: ? More redness, swelling, or pain. ? More fluid or blood. ? Warmth. ? More pus or a bad smell. Medicines   Take over-the-counter and prescription medicines only as told by your doctor.  If you were prescribed an antibiotic medicine, take it as told by your doctor. Do not stop taking the antibiotic even if you start to feel better. General instructions  To avoid spreading the infection: ? Do not share personal care items, towels, or hot tubs with others. ? Avoid making skin-to-skin contact with other people.  Keep all follow-up visits as told by your doctor. This is important. Contact a doctor if:  You have more redness, swelling, or pain around your abscess.  You have more fluid or blood coming from your abscess.  Your abscess feels warm when you touch it.  You have more pus or a bad smell coming from your abscess.  You have a fever.  Your muscles ache.  You have  chills.  You feel sick. Get help right away if:  You have very bad (severe) pain.  You see red streaks on your skin spreading away from the abscess. This information is not intended to replace advice given to you by your health care provider. Make sure you discuss any questions you have with your health care provider. Document Released: 09/11/2007 Document Revised: 11/19/2015 Document Reviewed: 02/01/2015 Elsevier Interactive Patient Education  2018 [employer] Inc[employer]  

## 2016-09-20 LAB — CERVICOVAGINAL ANCILLARY ONLY
Chlamydia: NEGATIVE
NEISSERIA GONORRHEA: NEGATIVE
TRICH (WINDOWPATH): NEGATIVE

## 2016-09-21 LAB — URINE CULTURE: Organism ID, Bacteria: NO GROWTH

## 2016-09-24 LAB — HGB A1C W/O EAG

## 2016-09-24 LAB — SPECIMEN STATUS REPORT

## 2016-09-25 ENCOUNTER — Encounter: Payer: Self-pay | Admitting: Obstetrics and Gynecology

## 2016-09-25 DIAGNOSIS — F1190 Opioid use, unspecified, uncomplicated: Secondary | ICD-10-CM | POA: Insufficient documentation

## 2016-09-25 LAB — OBSTETRIC PANEL, INCLUDING HIV
Antibody Screen: NEGATIVE
BASOS ABS: 0 10*3/uL (ref 0.0–0.2)
Basos: 0 %
EOS (ABSOLUTE): 0.1 10*3/uL (ref 0.0–0.4)
Eos: 1 %
HEP B S AG: NEGATIVE
HIV SCREEN 4TH GENERATION: NONREACTIVE
Hematocrit: 32.9 % — ABNORMAL LOW (ref 34.0–46.6)
Hemoglobin: 10.7 g/dL — ABNORMAL LOW (ref 11.1–15.9)
IMMATURE GRANULOCYTES: 0 %
Immature Grans (Abs): 0 10*3/uL (ref 0.0–0.1)
LYMPHS ABS: 2 10*3/uL (ref 0.7–3.1)
Lymphs: 23 %
MCH: 29.6 pg (ref 26.6–33.0)
MCHC: 32.5 g/dL (ref 31.5–35.7)
MCV: 91 fL (ref 79–97)
MONOS ABS: 0.5 10*3/uL (ref 0.1–0.9)
Monocytes: 6 %
NEUTROS ABS: 6.1 10*3/uL (ref 1.4–7.0)
NEUTROS PCT: 70 %
PLATELETS: 243 10*3/uL (ref 150–379)
RBC: 3.61 x10E6/uL — AB (ref 3.77–5.28)
RDW: 16 % — AB (ref 12.3–15.4)
RPR: NONREACTIVE
Rh Factor: POSITIVE
Rubella Antibodies, IGG: 1.43 index (ref >0.99)
WBC: 8.7 10*3/uL (ref 3.4–10.8)

## 2016-09-25 LAB — COMPREHENSIVE METABOLIC PANEL
A/G RATIO: 1.6 (ref 1.2–2.2)
ALK PHOS: 61 IU/L (ref 39–117)
ALT: 16 IU/L (ref 0–32)
AST: 16 IU/L (ref 0–40)
Albumin: 3.9 g/dL (ref 3.5–5.5)
BUN/Creatinine Ratio: 13 (ref 9–23)
BUN: 7 mg/dL (ref 6–20)
Bilirubin Total: 0.6 mg/dL (ref 0.0–1.2)
CALCIUM: 9.3 mg/dL (ref 8.7–10.2)
CO2: 18 mmol/L — AB (ref 20–29)
CREATININE: 0.56 mg/dL — AB (ref 0.57–1.00)
Chloride: 102 mmol/L (ref 96–106)
GFR calc Af Amer: 139 mL/min/{1.73_m2} (ref >59)
GFR calc non Af Amer: 120 mL/min/{1.73_m2} (ref >59)
Globulin, Total: 2.5 g/dL (ref 1.5–4.5)
Glucose: 64 mg/dL — ABNORMAL LOW (ref 65–99)
POTASSIUM: 4 mmol/L (ref 3.5–5.2)
SODIUM: 138 mmol/L (ref 134–144)
Total Protein: 6.4 g/dL (ref 6.0–8.5)

## 2016-09-25 LAB — PROTEIN / CREATININE RATIO, URINE
CREATININE, UR: 202 mg/dL
PROTEIN UR: 32.4 mg/dL
PROTEIN/CREAT RATIO: 160 mg/g{creat} (ref 0–200)

## 2016-09-25 LAB — TSH: TSH: 1.39 u[IU]/mL (ref 0.450–4.500)

## 2016-09-25 LAB — HEPATITIS C ANTIBODY: Hep C Virus Ab: <0.1 s/co ratio (ref 0.0–0.9)

## 2016-09-25 LAB — TOXASSURE SELECT 13 (MW), URINE

## 2016-09-27 ENCOUNTER — Encounter: Payer: Medicaid Other | Admitting: Obstetrics & Gynecology

## 2016-10-02 ENCOUNTER — Encounter: Payer: Self-pay | Admitting: Family Medicine

## 2016-10-02 DIAGNOSIS — Z880 Allergy status to penicillin: Secondary | ICD-10-CM | POA: Insufficient documentation

## 2016-10-07 ENCOUNTER — Ambulatory Visit (INDEPENDENT_AMBULATORY_CARE_PROVIDER_SITE_OTHER): Payer: Medicaid Other | Admitting: Family Medicine

## 2016-10-07 VITALS — BP 126/77 | HR 76 | Wt 157.0 lb

## 2016-10-07 DIAGNOSIS — O09521 Supervision of elderly multigravida, first trimester: Secondary | ICD-10-CM

## 2016-10-07 DIAGNOSIS — D508 Other iron deficiency anemias: Secondary | ICD-10-CM

## 2016-10-07 DIAGNOSIS — O99011 Anemia complicating pregnancy, first trimester: Secondary | ICD-10-CM

## 2016-10-07 DIAGNOSIS — O30042 Twin pregnancy, dichorionic/diamniotic, second trimester: Secondary | ICD-10-CM

## 2016-10-07 DIAGNOSIS — O0991 Supervision of high risk pregnancy, unspecified, first trimester: Secondary | ICD-10-CM

## 2016-10-07 DIAGNOSIS — I10 Essential (primary) hypertension: Secondary | ICD-10-CM

## 2016-10-07 DIAGNOSIS — O30041 Twin pregnancy, dichorionic/diamniotic, first trimester: Secondary | ICD-10-CM

## 2016-10-07 DIAGNOSIS — O0990 Supervision of high risk pregnancy, unspecified, unspecified trimester: Secondary | ICD-10-CM

## 2016-10-07 MED ORDER — PROMETHAZINE HCL 25 MG PO TABS: 25.0000 mg | ORAL_TABLET | Freq: Four times a day (QID) | ORAL | 0 refills | Status: DC | PRN

## 2016-10-07 MED ORDER — FERROUS SULFATE 325 (65 FE) MG PO TABS: 325.0000 mg | ORAL_TABLET | Freq: Two times a day (BID) | ORAL | 1 refills | Status: DC

## 2016-10-07 NOTE — Progress Notes (Signed)
   PRENATAL VISIT NOTE  Subjective:  [name] is a 36 y.o. [id number] at 901w0d being seen today for ongoing prenatal care.  She is currently monitored for the following issues for this high-risk pregnancy and has Tobacco abuse; Bipolar disorder (HCC); History of substance abuse; Sickle cell trait (HCC); Chronic hypertension; Supervision of high risk pregnancy, antepartum; History of herpes genitalis; History of trichomoniasis; AMA (advanced maternal age) multigravida 35+, first trimester; Grand multiparity; Short interval between pregnancies affecting pregnancy in first trimester, antepartum; Dichorionic diamniotic twin gestation; Opiate use; and Penicillin allergy on her problem list.  Patient reports vomiting.  Contractions: Not present. Vag. Bleeding: None.  Movement: Absent. Denies leaking of fluid.   The following portions of the patient's history were reviewed and updated as appropriate: allergies, current medications, past family history, past medical history, past social history, past surgical history and problem list. Problem list updated.  Objective:   Vitals:   10/07/16 1246  BP: 126/77  Pulse: 76  Weight: 157 lb (71.2 kg)    Fetal Status: Fetal Heart Rate (bpm): 147/154   Movement: Absent     General:  Alert, oriented and cooperative. Patient is in no acute distress.  Skin: Skin is warm and dry. No rash noted.   Cardiovascular: Normal heart rate noted  Respiratory: Normal respiratory effort, no problems with respiration noted  Abdomen: Soft, gravid, appropriate for gestational age. Pain/Pressure: Present     Pelvic:  Cervical exam deferred        Extremities: Normal range of motion.  Edema: None  Mental Status: Normal mood and affect. Normal behavior. Normal judgment and thought content.   Assessment and Plan:  Pregnancy: [id number] at 641w0d  1. Supervision of high risk pregnancy, antepartum FHT normal. FH appropriate for twins.  - US MFM OB DETAIL +14 WK; Future -  US MFM OB DETAIL ADDL GEST +14 WK; Future  2. AMA (advanced maternal age) multigravida 35+, first trimester No additional testing needed.  3. Chronic hypertension BP normal - US MFM OB DETAIL +14 WK; Future - US MFM OB DETAIL ADDL GEST +14 WK; Future  4. Dichorionic diamniotic twin pregnancy in second trimester - US MFM OB DETAIL +14 WK; Future - US MFM OB DETAIL ADDL GEST +14 WK; Future  5. Supervision of high risk pregnancy in first trimester - promethazine (PHENERGAN) 25 MG tablet; Take 1 tablet (25 mg total) by mouth every 6 (six) hours as needed for nausea or vomiting.  Dispense: 30 tablet; Refill: 0  6. Other iron deficiency anemia Iron prescribed  Preterm labor symptoms and general obstetric precautions including but not limited to vaginal bleeding, contractions, leaking of fluid and fetal movement were reviewed in detail with the patient. Please refer to After Visit Summary for other counseling recommendations.  Return in about 4 weeks (around 11/04/2016) for HR OB f/u.   Levie HeritageJacob J Caidence Kaseman, DO

## 2016-10-07 NOTE — Progress Notes (Deleted)
Elevated score on PHQ-9, requested to see behavior clinician.

## 2016-10-14 ENCOUNTER — Encounter (HOSPITAL_COMMUNITY): Payer: Self-pay | Admitting: *Deleted

## 2016-10-14 ENCOUNTER — Inpatient Hospital Stay (HOSPITAL_COMMUNITY): Payer: Medicaid Other

## 2016-10-14 ENCOUNTER — Inpatient Hospital Stay (HOSPITAL_COMMUNITY)
Admission: AD | Admit: 2016-10-14 | Discharge: 2016-10-14 | Disposition: A | Discharge status: Home / Self Care | Payer: Medicaid Other | Source: Ambulatory Visit | Attending: Family Medicine | Admitting: Family Medicine

## 2016-10-14 DIAGNOSIS — Z3A14 14 weeks gestation of pregnancy: Secondary | ICD-10-CM | POA: Diagnosis not present

## 2016-10-14 DIAGNOSIS — K429 Umbilical hernia without obstruction or gangrene: Secondary | ICD-10-CM | POA: Insufficient documentation

## 2016-10-14 DIAGNOSIS — O9989 Other specified diseases and conditions complicating pregnancy, childbirth and the puerperium: Secondary | ICD-10-CM

## 2016-10-14 DIAGNOSIS — O99612 Diseases of the digestive system complicating pregnancy, second trimester: Secondary | ICD-10-CM | POA: Diagnosis not present

## 2016-10-14 DIAGNOSIS — Z880 Allergy status to penicillin: Secondary | ICD-10-CM | POA: Insufficient documentation

## 2016-10-14 DIAGNOSIS — O26892 Other specified pregnancy related conditions, second trimester: Secondary | ICD-10-CM | POA: Diagnosis present

## 2016-10-14 DIAGNOSIS — Z87891 Personal history of nicotine dependence: Secondary | ICD-10-CM | POA: Insufficient documentation

## 2016-10-14 DIAGNOSIS — O30002 Twin pregnancy, unspecified number of placenta and unspecified number of amniotic sacs, second trimester: Secondary | ICD-10-CM | POA: Diagnosis not present

## 2016-10-14 DIAGNOSIS — R109 Unspecified abdominal pain: Secondary | ICD-10-CM | POA: Diagnosis present

## 2016-10-14 DIAGNOSIS — Z91040 Latex allergy status: Secondary | ICD-10-CM | POA: Diagnosis not present

## 2016-10-14 DIAGNOSIS — Z91010 Allergy to peanuts: Secondary | ICD-10-CM | POA: Insufficient documentation

## 2016-10-14 LAB — URINALYSIS, ROUTINE W REFLEX MICROSCOPIC
Bilirubin Urine: NEGATIVE
GLUCOSE, UA: NEGATIVE mg/dL
Hgb urine dipstick: NEGATIVE
KETONES UR: 20 mg/dL — AB
LEUKOCYTES UA: NEGATIVE
Nitrite: NEGATIVE
PH: 5 (ref 5.0–8.0)
Protein, ur: NEGATIVE mg/dL
Specific Gravity, Urine: 1.028 (ref 1.005–1.030)

## 2016-10-14 MED ORDER — HYDROMORPHONE HCL 1 MG/ML IJ SOLN
0.5000 mg | Freq: Once | INTRAMUSCULAR | Status: AC
  Administered 2016-10-14: 0.5 mg via INTRAMUSCULAR
  Filled 2016-10-14: qty 1

## 2016-10-14 NOTE — MAU Provider Note (Signed)
Chief Complaint: Abdominal Pain   First Provider Initiated Contact with Patient 10/14/16 2114        SUBJECTIVE HPI: [name] is a 36 y.o. [id number] at [redacted]w[redacted]d by LMP who presents to maternity admissions reporting pain with umbilical hernia. States was diagnosed with last pregnancy but never got it repaired.  States it usually "goes back in" but tonight it did not go back in and became more tender.. She denies vaginal bleeding, vaginal itching/burning, urinary symptoms, h/a, dizziness, n/v, or fever/chills.    Abdominal Pain  This is a new problem. The current episode started today. The problem occurs constantly. The problem has been unchanged. The pain is located in the periumbilical region. The pain is moderate. The quality of the pain is sharp. The abdominal pain does not radiate. Pertinent negatives include no anorexia, constipation, diarrhea, dysuria, fever, frequency, myalgias, nausea or vomiting. The pain is aggravated by palpation and movement. The pain is relieved by nothing. She has tried nothing for the symptoms.    RN note: PT  SAYS SHE HAS AN UMBILICAL HERNIA     WITH TWINS.    STARTED HURTING  AT 830PM-   CALLED  - TOLD  TO COME IN  . [facility]- CLINIC .      Past Medical History:  Diagnosis Date  . Anemia   . Bipolar 1 disorder (HCC)   . Chlamydia   . Chronic abdominal pain   . Chronic nausea   . Deliberate self-cutting   . HSV-2 infection 2015  . Hypertension   . Infection    MRSA in 1995, negative since  . Schizophrenia (HCC)   . Seizures (HCC)    Not recently  . Sickle cell trait (HCC)   . Trichomonas infection    Past Surgical History:  Procedure Laterality Date  . DILATION AND CURETTAGE OF UTERUS    . HEMORROIDECTOMY  2010  . plastic surgery on face     Social History   Social History  . Marital status: Legally Separated    Spouse name: N/A  . Number of children: N/A  . Years of education: N/A   Occupational History  . Not on file.   Social  History Main Topics  . Smoking status: Former Smoker    Packs/day: 0.50    Years: 1.50    Types: Cigarettes    Quit date: 04/08/2016  . Smokeless tobacco: Never Used  . Alcohol use No     Comment: hx drug use  . Drug use: Yes    Types: Marijuana, Cocaine     Comment: past use  . Sexual activity: Not Currently    Birth control/ protection: None     Comment: desires Depo Provera   Other Topics Concern  . Not on file   Social History Narrative  . No narrative on file   No current facility-administered medications on file prior to encounter.    Current Outpatient Prescriptions on File Prior to Encounter  Medication Sig Dispense Refill  . Prenatal Vit-Fe Fumarate-FA (PREPLUS) 27-1 MG TABS Take 1 tablet by mouth daily. 30 tablet 11  . promethazine (PHENERGAN) 25 MG tablet Take 1 tablet (25 mg total) by mouth every 6 (six) hours as needed for nausea or vomiting. 30 tablet 0  . ferrous sulfate (FERROUSUL) 325 (65 FE) MG tablet Take 1 tablet (325 mg total) by mouth 2 (two) times daily. (Patient not taking: Reported on 10/14/2016) 60 tablet 1   Allergies  Allergen Reactions  .  Peanut-Containing Drug Products Anaphylaxis, Itching and Rash  . Amoxicillin Hives, Swelling and Other (See Comments)    Has patient had a PCN reaction causing immediate rash, facial/tongue/throat swelling, SOB or lightheadedness with hypotension: No Has patient had a PCN reaction causing severe rash involving mucus membranes or skin necrosis: No Has patient had a PCN reaction that required hospitalization No Has patient had a PCN reaction occurring within the last 10 years: Yes If all of the above answers are "NO", then may proceed with Cephalosporin use.  . Latex Hives and Itching    I have reviewed patient's Past Medical Hx, Surgical Hx, Family Hx, Social Hx, medications and allergies.   ROS:  Review of Systems  Constitutional: Negative for fever.  Gastrointestinal: Positive for abdominal pain. Negative for  anorexia, constipation, diarrhea, nausea and vomiting.  Genitourinary: Negative for dysuria and frequency.  Musculoskeletal: Negative for myalgias.   Review of Systems  Other systems negative   Physical Exam  Physical Exam Patient Vitals for the past 24 hrs:  BP Temp Temp src Pulse Resp Height Weight  10/14/16 2104 (!) 112/58 98.9 F (37.2 C) Oral 86 18 5\' 3"  (1.6 m) 155 lb (70.3 kg)   Constitutional: Well-developed, well-nourished female in no acute distress.  Cardiovascular: normal rate Respiratory: normal effort GI: Abd soft, tender over umbilicus.   There is a golf-ball sized umbilical hernia with some induration, suspect incarceration. Pos BS x 4 MS: Extremities nontender, no edema, normal ROM Neurologic: Alert and oriented x 4.  GU: Neg CVAT.  PELVIC EXAM: deferred  FHT 160s x 2 by bedside US  LAB RESULTS No results found for this or any previous visit (from the past 24 hour(s)).  A/Positive/-- (06/14 0835)  IMAGING US Ob Comp Less 14 Wks  Result Date: 09/19/2016 CLINICAL DATA:  Heavy vaginal bleeding EXAM: TWIN OBSTETRIC <14WK [place] AND TRANSVAGINAL OB US COMPARISON:  None. FINDINGS: Number of IUPs:  2 Chorionicity/Amnionicity:  Dichorionic-diamniotic (thick membrane) TWIN 1 Yolk sac:  None Embryo:  Yes Cardiac Activity: Yes Heart Rate: 164 bpm CRL:  34.2  mm   10 w 2 d                  [place] EDC: 04/15/2017 TWIN 2 Yolk sac:  Yes Embryo:  Yes Cardiac Activity: Yes Heart Rate: 159 bpm CRL:  34.1  mm   10 w 2 d                  [place] EDC: 1/8/ 2019 Subchorionic hemorrhage:  None visualized. Maternal uterus/adnexae: Right ovary: Normal Left ovary: Normal Other :None Free fluid:  None IMPRESSION: 1. Living twin gestations. Estimated gestational age is 10 weeks and 2 days. 2. No explanation for patient's vaginal bleeding. No subchorionic hemorrhage noted. Electronically Signed   By: Signa Kell M.D.   On: 09/19/2016 11:51   US Ob Comp Addl Gest Less 14 Wks  Result Date:  09/19/2016 CLINICAL DATA:  Heavy vaginal bleeding EXAM: TWIN OBSTETRIC <14WK [place] AND TRANSVAGINAL OB US COMPARISON:  None. FINDINGS: Number of IUPs:  2 Chorionicity/Amnionicity:  Dichorionic-diamniotic (thick membrane) TWIN 1 Yolk sac:  None Embryo:  Yes Cardiac Activity: Yes Heart Rate: 164 bpm CRL:  34.2  mm   10 w 2 d                  [place] EDC: 04/15/2017 TWIN 2 Yolk sac:  Yes Embryo:  Yes Cardiac Activity: Yes Heart Rate: 159 bpm CRL:  34.1  mm   10 w 2 d                  US EDC: 1/8/ 2019 Subchorionic hemorrhage:  None visualized. Maternal uterus/adnexae: Right ovary: Normal Left ovary: Normal Other :None Free fluid:  None IMPRESSION: 1. Living twin gestations. Estimated gestational age is 10 weeks and 2 days. 2. No explanation for patient's vaginal bleeding. No subchorionic hemorrhage noted. Electronically Signed   By: Signa Kellaylor  Stroud M.D.   On: 09/19/2016 11:51   [place] Misc Soft Tissue  Result Date: 10/14/2016 CLINICAL DATA:  Pain with umbilical hernia. Second trimester pregnancy. EXAM: SOFT TISSUE ULTRASOUND - MISCELLANEOUS TECHNIQUE: Ultrasound examination of the anterior abdomen soft tissues was performed in the area of clinical concern. COMPARISON:  None. FINDINGS: 3.8 x 1.5 cm fat containing umbilical hernia with trace marginal anechoic probable fluid, internal vascularity. No peristalsing bowel within the hernia sac. IMPRESSION: 3.8 cm fat containing umbilical hernia corresponding to palpable abnormality. Electronically Signed   By: Awilda Metroourtnay  Bloomer M.D.   On: 10/14/2016 22:21     MAU Management/MDM: Consult Dr Shawnie PonsPratt.  She recommends US evaluation Ordered soft tissue ultrasound.   If positive will consult surgery. US showed fat containing hernia.  No bowel in hernia. Consulted Dr Michaell CowingGross who recommends we give her pain med and try to massage/reduce the hernia back in Dr Shawnie PonsPratt did massage it and on the third try, the hernia reduced. We gave her an abdominal binder for temporary relief. I sent a  message to clinic to get her an ambulatory surgery consult    ASSESSMENT Twin IUP at ,3761w1d Umbilical hernia, fat containing Hernia reduced  PLAN Discharge home Abdominal binder Surgery consultation per office Pt stable at time of discharge. Encouraged to return here or to other Urgent Care/ED if she develops worsening of symptoms, increase in pain, fever, or other concerning symptoms.    Wynelle BourgeoisMarie Lafonda Patron CNM, MSN Certified Nurse-Midwife 10/14/2016  9:15 PM

## 2016-10-14 NOTE — Discharge Instructions (Signed)
Umbilical Hernia, Adult A hernia is a bulge of tissue that pushes through an opening between muscles. An umbilical hernia happens in the abdomen, near the belly button (umbilicus). The hernia may contain tissues from the small intestine, large intestine, or fatty tissue covering the intestines (omentum). Umbilical hernias in adults tend to get worse over time, and they require surgical treatment. There are several types of umbilical hernias. You may have:  A hernia located just above or below the umbilicus (indirect hernia). This is the most common type of umbilical hernia in adults.  A hernia that forms through an opening formed by the umbilicus (direct hernia).  A hernia that comes and goes (reducible hernia). A reducible hernia may be visible only when you strain, lift something heavy, or cough. This type of hernia can be pushed back into the abdomen (reduced).  A hernia that traps abdominal tissue inside the hernia (incarcerated hernia). This type of hernia cannot be reduced.  A hernia that cuts off blood flow to the tissues inside the hernia (strangulated hernia). The tissues can start to die if this happens. This type of hernia requires emergency treatment.  What are the causes? An umbilical hernia happens when tissue inside the abdomen presses on a weak area of the abdominal muscles. What increases the risk? You may have a greater risk of this condition if you:  Are obese.  Have had several pregnancies.  Have a buildup of fluid inside your abdomen (ascites).  Have had surgery that weakens the abdominal muscles.  What are the signs or symptoms? The main symptom of this condition is a painless bulge at or near the belly button. A reducible hernia may be visible only when you strain, lift something heavy, or cough. Other symptoms may include:  Dull pain.  A feeling of pressure.  Symptoms of a strangulated hernia may include:  Pain that gets increasingly worse.  Nausea and  vomiting.  Pain when pressing on the hernia.  Skin over the hernia becoming red or purple.  Constipation.  Blood in the stool.  How is this diagnosed? This condition may be diagnosed based on:  A physical exam. You may be asked to cough or strain while standing. These actions increase the pressure inside your abdomen and force the hernia through the opening in your muscles. Your health care provider may try to reduce the hernia by pressing on it.  Your symptoms and medical history.  How is this treated? Surgery is the only treatment for an umbilical hernia. Surgery for a strangulated hernia is done as soon as possible. If you have a small hernia that is not incarcerated, you may need to lose weight before having surgery. Follow these instructions at home:  Lose weight, if told by your health care provider.  Do not try to push the hernia back in.  Watch your hernia for any changes in color or size. Tell your health care provider if any changes occur.  You may need to avoid activities that increase pressure on your hernia.  Do not lift anything that is heavier than 10 lb (4.5 kg) until your health care provider says that this is safe.  Take over-the-counter and prescription medicines only as told by your health care provider.  Keep all follow-up visits as told by your health care provider. This is important. Contact a health care provider if:  Your hernia gets larger.  Your hernia becomes painful. Get help right away if:  You develop sudden, severe pain near the   area of your hernia.  You have pain as well as nausea or vomiting.  You have pain and the skin over your hernia changes color.  You develop a fever. This information is not intended to replace advice given to you by your health care provider. Make sure you discuss any questions you have with your health care provider. Document Released: 08/25/2015 Document Revised: 11/26/2015 Document Reviewed:  08/25/2015 Elsevier Interactive Patient Education  2018 [employer] Inc.  

## 2016-10-14 NOTE — MAU Note (Signed)
PT  SAYS SHE HAS AN UMBILICAL HERNIA     WITH TWINS.    STARTED HURTING  AT 830PM-   CALLED  - T[facility]  [facility]  [facility]

## 2016-10-14 NOTE — MAU Note (Signed)
Pt reports about 30 minutes ago she started having pain around hernia.

## 2016-10-21 ENCOUNTER — Telehealth: Payer: Self-pay | Admitting: General Practice

## 2016-10-21 DIAGNOSIS — K429 Umbilical hernia without obstruction or gangrene: Secondary | ICD-10-CM

## 2016-10-21 NOTE — Telephone Encounter (Signed)
-----   Message from Aviva SignsMarie L Williams, CNM sent at 10/14/2016 11:28 PM EDT ----- Regarding: surgery referral Has an umbilical hernia which got incarcerated in MAU  Dr Shawnie PonsPratt reduced it but she needs a referral to Gen surgery for eval And planning of surgery after delivery, or before if it gets incarcerated again.  Sometime in the next few weeks  Thanks  marie

## 2016-10-21 NOTE — Telephone Encounter (Signed)
[facility] states notes need to be faxed then they will call patient with an appt. Fax form completed. Front office will send records

## 2016-11-04 ENCOUNTER — Encounter: Payer: Self-pay | Admitting: Obstetrics and Gynecology

## 2016-11-04 ENCOUNTER — Ambulatory Visit (INDEPENDENT_AMBULATORY_CARE_PROVIDER_SITE_OTHER): Payer: Medicaid Other | Admitting: Obstetrics and Gynecology

## 2016-11-04 VITALS — BP 133/70 | HR 81

## 2016-11-04 DIAGNOSIS — O09521 Supervision of elderly multigravida, first trimester: Secondary | ICD-10-CM

## 2016-11-04 DIAGNOSIS — I10 Essential (primary) hypertension: Secondary | ICD-10-CM

## 2016-11-04 DIAGNOSIS — O0990 Supervision of high risk pregnancy, unspecified, unspecified trimester: Secondary | ICD-10-CM

## 2016-11-04 DIAGNOSIS — O30042 Twin pregnancy, dichorionic/diamniotic, second trimester: Secondary | ICD-10-CM

## 2016-11-04 DIAGNOSIS — D573 Sickle-cell trait: Secondary | ICD-10-CM

## 2016-11-04 LAB — POCT URINALYSIS DIP (DEVICE)
GLUCOSE, UA: NEGATIVE mg/dL
HGB URINE DIPSTICK: NEGATIVE
Ketones, ur: NEGATIVE mg/dL
LEUKOCYTES UA: NEGATIVE
NITRITE: NEGATIVE
Protein, ur: 30 mg/dL — AB
UROBILINOGEN UA: 1 mg/dL (ref 0.0–1.0)
pH: 6.5 (ref 5.0–8.0)

## 2016-11-04 MED ORDER — FERROUS SULFATE 325 (65 FE) MG PO TABS: 325.0000 mg | ORAL_TABLET | Freq: Every day | ORAL | 3 refills | Status: DC

## 2016-11-04 MED ORDER — PREPLUS 27-1 MG PO TABS: 1.0000 | ORAL_TABLET | Freq: Every day | ORAL | 13 refills | Status: DC

## 2016-11-04 MED ORDER — ASPIRIN EC 81 MG PO TBEC: 81.0000 mg | DELAYED_RELEASE_TABLET | Freq: Every day | ORAL | 2 refills | Status: DC

## 2016-11-04 NOTE — Progress Notes (Signed)
Subjective:  [name] is a 36 y.o. [id number] at 494w0d being seen today for ongoing prenatal care.  She is currently monitored for the following issues for this high-risk pregnancy and has Tobacco abuse; Bipolar disorder (HCC); History of substance abuse; Sickle cell trait (HCC); Chronic hypertension; Supervision of high risk pregnancy, antepartum; History of herpes genitalis; History of trichomoniasis; AMA (advanced maternal age) multigravida 35+, first trimester; Grand multiparity; Short interval between pregnancies affecting pregnancy in first trimester, antepartum; Dichorionic diamniotic twin gestation; Opiate use; and Penicillin allergy on her problem list.  Patient reports no complaints.  Contractions: Not present. Vag. Bleeding: None.  Movement: Present. Denies leaking of fluid.   The following portions of the patient's history were reviewed and updated as appropriate: allergies, current medications, past family history, past medical history, past social history, past surgical history and problem list. Problem list updated.  Objective:   Vitals:   11/04/16 1242 11/04/16 1243  BP: (!) 145/70 133/70  Pulse: 82 81    Fetal Status: Fetal Heart Rate (bpm): 155/161   Movement: Present     General:  Alert, oriented and cooperative. Patient is in no acute distress.  Skin: Skin is warm and dry. No rash noted.   Cardiovascular: Normal heart rate noted  Respiratory: Normal respiratory effort, no problems with respiration noted  Abdomen: Soft, gravid, appropriate for gestational age. Pain/Pressure: Present     Pelvic:  Cervical exam deferred        Extremities: Normal range of motion.  Edema: None  Mental Status: Normal mood and affect. Normal behavior. Normal judgment and thought content.   Urinalysis:      Assessment and Plan:  Pregnancy: [id number] at 254w0d  1. Supervision of high risk pregnancy, antepartum  - Prenatal Vit-Fe Fumarate-FA (PREPLUS) 27-1 MG TABS; Take 1 tablet by  mouth daily.  Dispense: 30 tablet; Refill: 13 - ferrous sulfate 325 (65 FE) MG tablet; Take 1 tablet (325 mg total) by mouth daily.  Dispense: 30 tablet; Refill: 3  2. Dichorionic diamniotic twin pregnancy in second trimester Stable U/S next month  3. AMA (advanced maternal age) multigravida 35+, first trimester   4. Sickle cell trait (HCC)   5. Chronic hypertension BP, stable without meds - aspirin EC 81 MG tablet; Take 1 tablet (81 mg total) by mouth daily. Take after 12 weeks for prevention of preeclampsia later in pregnancy  Dispense: 300 tablet; Refill: 2  Preterm labor symptoms and general obstetric precautions including but not limited to vaginal bleeding, contractions, leaking of fluid and fetal movement were reviewed in detail with the patient. Please refer to After Visit Summary for other counseling recommendations.  Return in about 4 weeks (around 12/02/2016) for OB visit.   Hermina StaggersErvin, Jasman Pfeifle L, MD

## 2016-11-04 NOTE — Progress Notes (Signed)
Patient complains of Prenatal Vitamins makes her sick, Also that her Iron pills are not covered under her medicaid. Patient has not been taking any vitamins or iron pills.

## 2016-11-21 ENCOUNTER — Inpatient Hospital Stay (HOSPITAL_COMMUNITY)
Admission: AD | Admit: 2016-11-21 | Discharge: 2016-11-21 | Disposition: A | Discharge status: Home / Self Care | Payer: Medicaid Other | Source: Ambulatory Visit | Attending: Family Medicine | Admitting: Family Medicine

## 2016-11-21 ENCOUNTER — Encounter (HOSPITAL_COMMUNITY): Payer: Self-pay

## 2016-11-21 DIAGNOSIS — Z7982 Long term (current) use of aspirin: Secondary | ICD-10-CM | POA: Diagnosis not present

## 2016-11-21 DIAGNOSIS — Z3A19 19 weeks gestation of pregnancy: Secondary | ICD-10-CM | POA: Insufficient documentation

## 2016-11-21 DIAGNOSIS — O26892 Other specified pregnancy related conditions, second trimester: Secondary | ICD-10-CM | POA: Diagnosis not present

## 2016-11-21 DIAGNOSIS — K429 Umbilical hernia without obstruction or gangrene: Secondary | ICD-10-CM

## 2016-11-21 DIAGNOSIS — N6452 Nipple discharge: Secondary | ICD-10-CM | POA: Diagnosis not present

## 2016-11-21 DIAGNOSIS — Z880 Allergy status to penicillin: Secondary | ICD-10-CM | POA: Insufficient documentation

## 2016-11-21 DIAGNOSIS — Z87891 Personal history of nicotine dependence: Secondary | ICD-10-CM | POA: Insufficient documentation

## 2016-11-21 DIAGNOSIS — O99612 Diseases of the digestive system complicating pregnancy, second trimester: Secondary | ICD-10-CM | POA: Diagnosis not present

## 2016-11-21 LAB — URINALYSIS, ROUTINE W REFLEX MICROSCOPIC
Bilirubin Urine: NEGATIVE
GLUCOSE, UA: NEGATIVE mg/dL
Hgb urine dipstick: NEGATIVE
KETONES UR: NEGATIVE mg/dL
LEUKOCYTES UA: NEGATIVE
NITRITE: NEGATIVE
Protein, ur: NEGATIVE mg/dL
Specific Gravity, Urine: 1.021 (ref 1.005–1.030)
pH: 5 (ref 5.0–8.0)

## 2016-11-21 NOTE — Discharge Instructions (Signed)

## 2016-11-21 NOTE — MAU Provider Note (Signed)
History     CSN: [id number]  Arrival date and time: 11/21/16 1753  Chief Complaint  Patient presents with  . bleeding from nipples  . Breast Pain  . painful hernia    HPI: [name] is a 36 y.o. [id number] with twin IUP at 6986w3d who presents to maternity admissions reporting sore nipples with bloody discharge. First noticed bloody discharge yesterday and some today.  Also c/o increase pain and size of umbilical hernia. She was seen for umbilical hernia in MAU about a month ago, which was manually reduced, and patient given abdominal binder, but she reports she is not using this d/t discomfort.   Denies contractions, leakage of fluid or vaginal bleeding. Also denies any abnormal vaginal discharge, fevers, chills, sweats, dysuria, nausea, vomiting, other GI or GU symptoms or other general symptoms.  [facility]  Past obstetric history: OB History  Gravida Para Term Preterm AB Living  15 8 8  0 6 8  SAB TAB Ectopic Multiple Live Births  6 0 0 0 8    # Outcome Date GA Lbr Len/2nd Weight Sex Delivery Anes PTL Lv  15 Current           14 Term 04/18/16 5650w4d 05:54 / 00:05 6 lb 6.8 oz (2.915 kg) M Vag-Spont EPI  LIV  13 Term 03/23/15 2762w0d 01:16 / 01:49 6 lb 8.1 oz (2.95 kg) M Vag-Spont EPI  LIV     Birth Comments: Neonatal jaundice, required phototherapy  12 Term 11/28/13 8163w1d 17:02 / 00:14  M Vag-Spont EPI  LIV  11 Term 04/28/11 5564w3d 07:06 / 00:12 7 lb (3.175 kg) M Vag-Spont EPI  LIV     Birth Comments: wnl  10 Term 2011 3437w0d  7 lb 9 oz (3.43 kg) F Vag-Spont   LIV  9 Term 2003   7 lb 13 oz (3.544 kg) F Vag-Spont   LIV  8 SAB           7 SAB           6 SAB           5 SAB           4 SAB           3 SAB           2 Term    6 lb 13 oz (3.09 kg) F Vag-Spont   LIV  1 Term    7 lb 13 oz (3.544 kg) F Vag-Spont   LIV      Past Medical History:  Diagnosis Date  . Anemia   . Bipolar 1 disorder (HCC)   . Chlamydia   . Chronic abdominal pain   . Chronic  nausea   . Deliberate self-cutting   . HSV-2 infection 2015  . Hypertension   . Infection    MRSA in 1995, negative since  . Schizophrenia (HCC)   . Seizures (HCC)    Not recently  . Sickle cell trait (HCC)   . Trichomonas infection     Past Surgical History:  Procedure Laterality Date  . DILATION AND CURETTAGE OF UTERUS    . HEMORROIDECTOMY  2010  . plastic surgery on face      Family History  Problem Relation Age of Onset  . Cancer Mother   . Heart failure Mother   . Hypertension Mother   . Stroke Mother   . Diabetes Mother   . Hypertension Maternal Grandmother   .  Anesthesia problems Neg Hx     Social History  Substance Use Topics  . Smoking status: Former Smoker    Packs/day: 0.50    Years: 1.50    Types: Cigarettes    Quit date: 04/09/2015  . Smokeless tobacco: Never Used  . Alcohol use No     Comment: hx drug use    Allergies:  Allergies  Allergen Reactions  . Peanut-Containing Drug Products Anaphylaxis, Itching and Rash  . Amoxicillin Hives, Swelling and Other (See Comments)    Has patient had a PCN reaction causing immediate rash, facial/tongue/throat swelling, SOB or lightheadedness with hypotension: No Has patient had a PCN reaction causing severe rash involving mucus membranes or skin necrosis: No Has patient had a PCN reaction that required hospitalization No Has patient had a PCN reaction occurring within the last 10 years: Yes If all of the above answers are "NO", then may proceed with Cephalosporin use.  . Latex Hives and Itching    Prescriptions Prior to Admission  Medication Sig Dispense Refill Last Dose  . aspirin EC 81 MG tablet Take 1 tablet (81 mg total) by mouth daily. Take after 12 weeks for prevention of preeclampsia later in pregnancy 300 tablet 2   . ferrous sulfate 325 (65 FE) MG tablet Take 1 tablet (325 mg total) by mouth daily. 30 tablet 3   . Prenatal Vit-Fe Fumarate-FA (PREPLUS) 27-1 MG TABS Take 1 tablet by mouth daily. 30  tablet 13   . promethazine (PHENERGAN) 25 MG tablet Take 1 tablet (25 mg total) by mouth every 6 (six) hours as needed for nausea or vomiting. (Patient not taking: Reported on 11/04/2016) 30 tablet 0 Not Taking    Review of Systems - Negative except for what is mentioned in HPI.  Physical Exam   Blood pressure 119/68, pulse 82, temperature 98.8 F (37.1 C), temperature source Oral, resp. rate 18, weight 160 lb 12 oz (72.9 kg), last menstrual period 07/08/2016, SpO2 100 %, not currently breastfeeding.  Constitutional: Well-developed, well-nourished female in no acute distress.  Cardiovascular: normal rate, regular rythm Respiratory: normal effort, lungs CTAB.  Breast: Bilateral breast with normal contours and no palpable nodules or masses. Nipples normal appearing with small amount of serosanguinous discharge from both nipple expressed. GI: Abd soft, gravid appropriate for gestational age. Palpable umbilical hernia containing fat, easily reducible on exam. ~2-3 defect palpable.  MSK: Extremities nontender, no edema, normal ROM Neurologic: Alert and oriented x 4. Psych: Normal mood and affect    MAU Course  Procedures  MDM Umbilical hernia reduced on exam. Nipple discharge serosanguinous is bilateral and bilateral breast exam otherwise normal, ressuring.   Assessment and Plan  Assessment: 1. Nipple discharge, bloody   2. Umbilical hernia without obstruction and without gangrene     Plan: --Discussed nature of bilateral bloody nipple discharge, which may occur in about 20% of women during 2nd/3rd trimester pregnancy or lactating. Discussed precautions. --Umbilical hernia reduced. Encouraged using binder. Discussed some management options. Patient also given information for [facility] for consultation.  --Discharge home in stable condition.     Asalee Barrette, Kandra Nicolas, MD 11/21/2016 8:28 PM

## 2016-11-21 NOTE — MAU Note (Signed)
Has blood leaking from both of her nipples and they are sore. Has an umbilical hernia, is getting bigger and more uncomfortable.  Was given a belly band, not wearing it, "too uncomfortable".

## 2016-11-25 ENCOUNTER — Ambulatory Visit (HOSPITAL_COMMUNITY): Payer: Medicaid Other

## 2016-11-26 ENCOUNTER — Ambulatory Visit (HOSPITAL_COMMUNITY)
Admission: RE | Admit: 2016-11-26 | Discharge: 2016-11-26 | Disposition: A | Discharge status: Home / Self Care | Payer: Medicaid Other | Source: Ambulatory Visit | Attending: Family Medicine | Admitting: Family Medicine

## 2016-11-26 ENCOUNTER — Telehealth: Payer: Self-pay | Admitting: Obstetrics and Gynecology

## 2016-11-26 ENCOUNTER — Encounter (HOSPITAL_COMMUNITY): Payer: Self-pay

## 2016-11-26 ENCOUNTER — Other Ambulatory Visit (HOSPITAL_COMMUNITY): Payer: Self-pay | Admitting: *Deleted

## 2016-11-26 ENCOUNTER — Other Ambulatory Visit: Payer: Medicaid Other

## 2016-11-26 ENCOUNTER — Telehealth: Payer: Self-pay

## 2016-11-26 DIAGNOSIS — O99322 Drug use complicating pregnancy, second trimester: Secondary | ICD-10-CM | POA: Insufficient documentation

## 2016-11-26 DIAGNOSIS — D573 Sickle-cell trait: Secondary | ICD-10-CM | POA: Insufficient documentation

## 2016-11-26 DIAGNOSIS — O30042 Twin pregnancy, dichorionic/diamniotic, second trimester: Secondary | ICD-10-CM | POA: Insufficient documentation

## 2016-11-26 DIAGNOSIS — O09892 Supervision of other high risk pregnancies, second trimester: Secondary | ICD-10-CM | POA: Diagnosis not present

## 2016-11-26 DIAGNOSIS — O99332 Smoking (tobacco) complicating pregnancy, second trimester: Secondary | ICD-10-CM | POA: Insufficient documentation

## 2016-11-26 DIAGNOSIS — O09522 Supervision of elderly multigravida, second trimester: Secondary | ICD-10-CM | POA: Insufficient documentation

## 2016-11-26 DIAGNOSIS — O10012 Pre-existing essential hypertension complicating pregnancy, second trimester: Secondary | ICD-10-CM | POA: Diagnosis not present

## 2016-11-26 DIAGNOSIS — O99012 Anemia complicating pregnancy, second trimester: Secondary | ICD-10-CM | POA: Insufficient documentation

## 2016-11-26 DIAGNOSIS — Z809 Family history of malignant neoplasm, unspecified: Secondary | ICD-10-CM | POA: Insufficient documentation

## 2016-11-26 DIAGNOSIS — G40909 Epilepsy, unspecified, not intractable, without status epilepticus: Secondary | ICD-10-CM | POA: Diagnosis not present

## 2016-11-26 DIAGNOSIS — O99352 Diseases of the nervous system complicating pregnancy, second trimester: Secondary | ICD-10-CM | POA: Insufficient documentation

## 2016-11-26 DIAGNOSIS — Z3A20 20 weeks gestation of pregnancy: Secondary | ICD-10-CM | POA: Insufficient documentation

## 2016-11-26 DIAGNOSIS — F1910 Other psychoactive substance abuse, uncomplicated: Secondary | ICD-10-CM | POA: Insufficient documentation

## 2016-11-26 DIAGNOSIS — I10 Essential (primary) hypertension: Secondary | ICD-10-CM

## 2016-11-26 DIAGNOSIS — O0990 Supervision of high risk pregnancy, unspecified, unspecified trimester: Secondary | ICD-10-CM

## 2016-11-26 DIAGNOSIS — O0991 Supervision of high risk pregnancy, unspecified, first trimester: Secondary | ICD-10-CM

## 2016-11-26 DIAGNOSIS — O352XX1 Maternal care for (suspected) hereditary disease in fetus, fetus 1: Secondary | ICD-10-CM

## 2016-11-26 NOTE — Telephone Encounter (Signed)
Tried to call patient about her missed lab appointment for today, but her phone was not working.

## 2016-11-26 NOTE — Telephone Encounter (Signed)
MFM called and is requesting patient have a hemoglobin electrophoresis drawn. According to patient chart she has not had one is a couple years. They will need this drawn for generic counseling. Orders placed in epic.

## 2016-11-27 ENCOUNTER — Telehealth: Payer: Self-pay | Admitting: General Practice

## 2016-11-27 DIAGNOSIS — O352XX1 Maternal care for (suspected) hereditary disease in fetus, fetus 1: Secondary | ICD-10-CM | POA: Insufficient documentation

## 2016-11-27 DIAGNOSIS — N6452 Nipple discharge: Secondary | ICD-10-CM

## 2016-11-27 DIAGNOSIS — Z3A20 20 weeks gestation of pregnancy: Secondary | ICD-10-CM | POA: Insufficient documentation

## 2016-11-27 NOTE — Progress Notes (Signed)
Genetic Counseling  High-Risk Gestation Note  Appointment Date:  11/27/2016 Referred By: Levie Heritage, DO Date of Birth:  [birth year]  Pregnancy History: [id number] Estimated Date of Delivery: 04/14/17 Estimated Gestational Age: [redacted]w[redacted]d Attending: Particia Nearing, MD  [name] was seen for genetic counseling because of a maternal age of 38.      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   We reviewed chromosomes, nondisjunction, and the associated 1 in 100 risk for fetal aneuploidy related to a maternal age of 79 at [redacted]w[redacted]d gestation. She was counseled that the risk for aneuploidy decreases as gestational age increases, accounting for those pregnancies which spontaneously abort.  We specifically discussed Down syndrome (trisomy 74), trisomies 59 and 38, and sex chromosome aneuploidies (47,XXX and 47,XXY) including the common features and prognoses of each.   We reviewed available screening options including Quad screen, noninvasive prenatal screening (NIPS)/cell free DNA (cfDNA) screening, and detailed  ultrasound. She was counseled that screening tests are used to modify a patient's a priori risk for aneuploidy, typically based on age. This estimate provides a pregnancy specific risk assessment. We reviewed the benefits and limitations of each option. Specifically, we discussed the conditions for which each test screens, the detection rates, and false positive rates of each. She was also counseled regarding diagnostic testing via amniocentesis. We reviewed the approximate 1 in 500 risk for complications from amniocentesis, including spontaneous pregnancy loss. We discussed the possible results that the tests might provide including: positive, negative, unanticipated, and no result. Finally, she was counseled regarding the cost of each option and potential out of pocket expenses.  After consideration of all the options, she elected to have a detailed anatomy ultrasound, which was performed today.  The ultrasound report will be documented separately. There were no visualized fetal anomalies or markers suggestive of aneuploidy. The pregnancy is dichorionic-diamniotic twins.  Diagnostic testing was declined today. Quad screening and NIPS were declined today.  She understands that screening tests cannot rule out all birth defects or genetic syndromes. The patient was advised of this limitation and states she still does not want additional testing at this time.   [name] was provided with written information regarding cystic fibrosis (CF), spinal muscular atrophy (SMA) and hemoglobinopathies including the carrier frequency, availability of carrier screening and prenatal diagnosis if indicated.  In addition, we discussed that CF and hemoglobinopathies are routinely screened for as part of the [place] newborn screening panel.  After further discussion, she declined screening for CF and SMA.  [name] reported that she has sickle cell trait (SCT). It is also noted in her chart notes/problem list that she has SCT. 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 I discussed both SCT and HPFH trait with the patient, including features, inheritance, and the availability of testing to clarify her carrier status. [name] elected to have hemoglobin electrophoresis today. We also discussed the option of testing her partner for any hemoglobin variant and prenatal diagnosis, if warranted. [name] stated that she is not in contact with the FOB and that she does not wish to pursue prenatal testing for hemoglobinopathies. She wishes to have newborn screening/postnatal testing for hemoglobinopathies.   Both family histories were reviewed and found to be contributory for a strong family history of cancer. Though most cancers are thought to be sporadic or due to environmental factors, some families appear to have a strong predisposition to cancers.  When considering a family history of cancer, we look for common types of cancer in multiple family members occurring at younger than typical ages. we discussed the option of meeting with a cancer genetic counselor to discuss any possible screening or testing options available. We discussed that if [name] is concerned about the family history of cancer and would like to learn more about the family's chance for an inherited cancer syndrome, his doctor may refer him or his relatives to [facility] [phone]) or [facility] at [phone].   In addition, [name] reported that her maternal grandmother had Alzheimer disease (AD). Her age of diagnosis is not known. In addition, she reported that her mom is forgetful (60's). We discussed that AD is typically regarded as a multifactorial condition.  First degree relatives of individuals with simplex AD are estimated to  have a 15-30% risk of developing AD.  In some families, AD can be autosomal dominant where if one parent has the condition each child has a 50% (1 in 2) risk to inherit the causative gene mutation and thus inherit the condition. Age of onset of symptoms is commonly earlier in these families.   Molecular genetic testing for mutations several genes that can cause AD is available on a clinical basis, although this testing would not be informative for an asymptomatic individual unless the specific mutation was identified in an affected family member first.  All people have an approximate 10-12% lifetime risk for dementia. If the patient or her relatives are interested in discussing these options further, her physician may refer her to an adult genetic counseling clinic.   No information is known about the FOB or his relatives. Without further information regarding the provided family history, an accurate genetic risk cannot be calculated. Further genetic counseling is warranted if more information is obtained.  Ms. Parish denied exposure to environmental toxins or chemical agents. She denied the use of alcohol, tobacco or street drugs. She denied significant viral illnesses during the course of her pregnancy.    I counseled this patient regarding the above risks and available options.  The approximate face-to-face time with the genetic counselor was 50 minutes.  Donald Prose, MS Certified Genetic Counselor

## 2016-11-27 NOTE — Telephone Encounter (Signed)
Referral placed & [facility] notified. Called and discussed with patient that someone from [facility] will be contacting her with an appt. Patient verbalized understanding & had no questions

## 2016-11-27 NOTE — Telephone Encounter (Signed)
-----   Message from Frederik Pear, MD sent at 11/23/2016  4:42 PM EDT ----- Regarding: Surgical referral Can we please place referral to surgery for patient for bilateral bloody nipple discharge?  Thank you  Kandra Nicolas. [place], MD OB Fellow

## 2016-12-02 ENCOUNTER — Other Ambulatory Visit (HOSPITAL_COMMUNITY)
Admission: RE | Admit: 2016-12-02 | Discharge: 2016-12-02 | Disposition: A | Discharge status: Home / Self Care | Payer: Medicaid Other | Source: Ambulatory Visit | Attending: Obstetrics and Gynecology | Admitting: Obstetrics and Gynecology

## 2016-12-02 ENCOUNTER — Ambulatory Visit (INDEPENDENT_AMBULATORY_CARE_PROVIDER_SITE_OTHER): Payer: Medicaid Other | Admitting: Obstetrics and Gynecology

## 2016-12-02 VITALS — BP 123/67 | HR 101 | Wt 163.4 lb

## 2016-12-02 DIAGNOSIS — N898 Other specified noninflammatory disorders of vagina: Secondary | ICD-10-CM | POA: Diagnosis not present

## 2016-12-02 DIAGNOSIS — O30042 Twin pregnancy, dichorionic/diamniotic, second trimester: Secondary | ICD-10-CM

## 2016-12-02 DIAGNOSIS — I10 Essential (primary) hypertension: Secondary | ICD-10-CM

## 2016-12-02 DIAGNOSIS — O09522 Supervision of elderly multigravida, second trimester: Secondary | ICD-10-CM

## 2016-12-02 DIAGNOSIS — O0990 Supervision of high risk pregnancy, unspecified, unspecified trimester: Secondary | ICD-10-CM

## 2016-12-02 NOTE — Progress Notes (Signed)
   PRENATAL VISIT NOTE  Subjective:  [name] is a 36 y.o. [id number] at [redacted]w[redacted]d being seen today for ongoing prenatal care.  She is currently monitored for the following issues for this high-risk pregnancy and has Tobacco abuse; Bipolar disorder (HCC); History of substance abuse; Sickle cell trait (HCC); Chronic hypertension; Supervision of high risk pregnancy, antepartum; History of herpes genitalis; History of trichomoniasis; Advanced maternal age in multigravida, second trimester; Grand multiparity; Short interval between pregnancies affecting pregnancy in first trimester, antepartum; Dichorionic diamniotic twin gestation; Opiate use; Penicillin allergy; [redacted] weeks gestation of pregnancy; and Hereditary disease in family possibly affecting fetus, fetus 1 on her problem list.  Patient reports no complaints.  Contractions: Not present. Vag. Bleeding: None.  Movement: Present. Denies leaking of fluid.   The following portions of the patient's history were reviewed and updated as appropriate: allergies, current medications, past family history, past medical history, past social history, past surgical history and problem list. Problem list updated.  Objective:   Vitals:   12/02/16 1247  BP: 123/67  Pulse: (!) 101  Weight: 163 lb 6.4 oz (74.1 kg)    Fetal Status: Fetal Heart Rate (bpm): 140/149   Movement: Present     General:  Alert, oriented and cooperative. Patient is in no acute distress.  Skin: Skin is warm and dry. No rash noted.   Cardiovascular: Normal heart rate noted  Respiratory: Normal respiratory effort, no problems with respiration noted  Abdomen: Soft, gravid, appropriate for gestational age.  Pain/Pressure: Present     Pelvic: Cervical exam deferred        Extremities: Normal range of motion.  Edema: None  Mental Status:  Normal mood and affect. Normal behavior. Normal judgment and thought content.   Assessment and Plan:  Pregnancy: [id number] at [redacted]w[redacted]d  1. Supervision  of high risk pregnancy, antepartum Patient is doing well without complaints. She desires to be screened for STDs. She denies any recent exposure. She denies any abnormal discharge or pelvic pain  2. Advanced maternal age in multigravida, second trimester Patient seen by genetic counselor. She declined antenatal testing  3. Dichorionic diamniotic twin pregnancy in second trimester Normal anatomy  Follow up growth in September already scheduled  4. Chronic hypertension Normotensive without meds Continue ASA  Preterm labor symptoms and general obstetric precautions including but not limited to vaginal bleeding, contractions, leaking of fluid and fetal movement were reviewed in detail with the patient. Please refer to After Visit Summary for other counseling recommendations.  Return in about 4 weeks (around 12/30/2016) for ROB.   Catalina Antigua, MD

## 2016-12-03 LAB — HIV ANTIBODY (ROUTINE TESTING W REFLEX): HIV SCREEN 4TH GENERATION: NONREACTIVE

## 2016-12-03 LAB — RPR: RPR Ser Ql: NONREACTIVE

## 2016-12-04 LAB — CERVICOVAGINAL ANCILLARY ONLY
BACTERIAL VAGINITIS: NEGATIVE
CANDIDA VAGINITIS: NEGATIVE
Chlamydia: NEGATIVE
NEISSERIA GONORRHEA: NEGATIVE
TRICH (WINDOWPATH): NEGATIVE

## 2016-12-06 ENCOUNTER — Telehealth: Payer: Self-pay | Admitting: General Practice

## 2016-12-06 NOTE — Telephone Encounter (Signed)
Patient called and left message requesting test results. Called patient and informed her of negative results. Patient verbalized understanding and had no questions

## 2016-12-24 ENCOUNTER — Other Ambulatory Visit (HOSPITAL_COMMUNITY): Payer: Self-pay | Admitting: Maternal and Fetal Medicine

## 2016-12-24 ENCOUNTER — Ambulatory Visit (HOSPITAL_COMMUNITY)
Admission: RE | Admit: 2016-12-24 | Discharge: 2016-12-24 | Disposition: A | Discharge status: Home / Self Care | Payer: Medicaid Other | Source: Ambulatory Visit | Attending: Obstetrics and Gynecology | Admitting: Obstetrics and Gynecology

## 2016-12-24 DIAGNOSIS — O365922 Maternal care for other known or suspected poor fetal growth, second trimester, fetus 2: Secondary | ICD-10-CM | POA: Diagnosis not present

## 2016-12-24 DIAGNOSIS — O09892 Supervision of other high risk pregnancies, second trimester: Secondary | ICD-10-CM | POA: Diagnosis not present

## 2016-12-24 DIAGNOSIS — O30042 Twin pregnancy, dichorionic/diamniotic, second trimester: Secondary | ICD-10-CM | POA: Insufficient documentation

## 2016-12-24 DIAGNOSIS — Z362 Encounter for other antenatal screening follow-up: Secondary | ICD-10-CM

## 2016-12-24 DIAGNOSIS — F1910 Other psychoactive substance abuse, uncomplicated: Secondary | ICD-10-CM | POA: Insufficient documentation

## 2016-12-24 DIAGNOSIS — O99332 Smoking (tobacco) complicating pregnancy, second trimester: Secondary | ICD-10-CM | POA: Diagnosis not present

## 2016-12-24 DIAGNOSIS — Z862 Personal history of diseases of the blood and blood-forming organs and certain disorders involving the immune mechanism: Secondary | ICD-10-CM

## 2016-12-24 DIAGNOSIS — O10012 Pre-existing essential hypertension complicating pregnancy, second trimester: Secondary | ICD-10-CM | POA: Insufficient documentation

## 2016-12-24 DIAGNOSIS — O99322 Drug use complicating pregnancy, second trimester: Secondary | ICD-10-CM | POA: Diagnosis not present

## 2016-12-24 DIAGNOSIS — O09522 Supervision of elderly multigravida, second trimester: Secondary | ICD-10-CM | POA: Diagnosis not present

## 2016-12-24 DIAGNOSIS — O0942 Supervision of pregnancy with grand multiparity, second trimester: Secondary | ICD-10-CM | POA: Insufficient documentation

## 2016-12-24 DIAGNOSIS — Z3A24 24 weeks gestation of pregnancy: Secondary | ICD-10-CM

## 2016-12-24 DIAGNOSIS — O99352 Diseases of the nervous system complicating pregnancy, second trimester: Secondary | ICD-10-CM | POA: Diagnosis not present

## 2016-12-24 DIAGNOSIS — G40909 Epilepsy, unspecified, not intractable, without status epilepticus: Secondary | ICD-10-CM | POA: Diagnosis not present

## 2016-12-25 ENCOUNTER — Other Ambulatory Visit (HOSPITAL_COMMUNITY): Payer: Self-pay | Admitting: *Deleted

## 2016-12-25 DIAGNOSIS — O365920 Maternal care for other known or suspected poor fetal growth, second trimester, not applicable or unspecified: Principal | ICD-10-CM

## 2016-12-25 DIAGNOSIS — O30002 Twin pregnancy, unspecified number of placenta and unspecified number of amniotic sacs, second trimester: Secondary | ICD-10-CM

## 2016-12-30 ENCOUNTER — Ambulatory Visit (INDEPENDENT_AMBULATORY_CARE_PROVIDER_SITE_OTHER): Payer: Medicaid Other | Admitting: Obstetrics & Gynecology

## 2016-12-30 ENCOUNTER — Encounter: Payer: Self-pay | Admitting: Obstetrics & Gynecology

## 2016-12-30 VITALS — BP 126/76 | HR 93 | Wt 164.9 lb

## 2016-12-30 DIAGNOSIS — O0992 Supervision of high risk pregnancy, unspecified, second trimester: Secondary | ICD-10-CM

## 2016-12-30 DIAGNOSIS — O0990 Supervision of high risk pregnancy, unspecified, unspecified trimester: Secondary | ICD-10-CM

## 2016-12-30 DIAGNOSIS — O30042 Twin pregnancy, dichorionic/diamniotic, second trimester: Secondary | ICD-10-CM

## 2016-12-30 DIAGNOSIS — O0991 Supervision of high risk pregnancy, unspecified, first trimester: Secondary | ICD-10-CM

## 2016-12-30 DIAGNOSIS — I10 Essential (primary) hypertension: Secondary | ICD-10-CM

## 2016-12-30 LAB — POCT URINALYSIS DIP (DEVICE)
Glucose, UA: NEGATIVE mg/dL
Hgb urine dipstick: NEGATIVE
Leukocytes, UA: NEGATIVE
Nitrite: NEGATIVE
PH: 6.5 (ref 5.0–8.0)
PROTEIN: 30 mg/dL — AB
SPECIFIC GRAVITY, URINE: 1.025 (ref 1.005–1.030)
Urobilinogen, UA: 1 mg/dL (ref 0.0–1.0)

## 2016-12-30 NOTE — Patient Instructions (Signed)

## 2016-12-30 NOTE — Progress Notes (Signed)
Having dental issues, has seen dentist- states he plans to remove more teeth after pregnancy. Is taking tylenol #3 as needed as prescribed from dentist.  Declines flu shot at this time. Will notify us is she would like it.

## 2016-12-30 NOTE — Progress Notes (Signed)
   PRENATAL VISIT NOTE  Subjective:  [name] is a 36 y.o. [id number] at [redacted]w[redacted]d being seen today for ongoing prenatal care.  She is currently monitored for the following issues for this high-risk pregnancy and has Tobacco abuse; Bipolar disorder (HCC); History of substance abuse; Sickle cell trait (HCC); Chronic hypertension; Supervision of high risk pregnancy, antepartum; History of herpes genitalis; History of trichomoniasis; Advanced maternal age in multigravida, second trimester; Grand multiparity; Short interval between pregnancies affecting pregnancy in first trimester, antepartum; Dichorionic diamniotic twin gestation; Opiate use; Penicillin allergy; [redacted] weeks gestation of pregnancy; and Hereditary disease in family possibly affecting fetus, fetus 1 on her problem list.  Patient reports dental pain.  Contractions: Irregular. Vag. Bleeding: None.  Movement: Present. Denies leaking of fluid.   The following portions of the patient's history were reviewed and updated as appropriate: allergies, current medications, past family history, past medical history, past social history, past surgical history and problem list. Problem list updated.  Objective:   Vitals:   12/30/16 1107  BP: 126/76  Pulse: 93  Weight: 74.8 kg (164 lb 14.4 oz)    Fetal Status: Fetal Heart Rate (bpm): 150/145   Movement: Present     General:  Alert, oriented and cooperative. Patient is in no acute distress.  Skin: Skin is warm and dry. No rash noted.   Cardiovascular: Normal heart rate noted  Respiratory: Normal respiratory effort, no problems with respiration noted  Abdomen: Soft, gravid, appropriate for gestational age.  Pain/Pressure: Present     Pelvic: Cervical exam deferred        Extremities: Normal range of motion.  Edema: None  Mental Status:  Normal mood and affect. Normal behavior. Normal judgment and thought content.   Assessment and Plan:  Pregnancy: [id number] at [redacted]w[redacted]d  1. Chronic  hypertension Nl BP   2. Supervision of high risk pregnancy, antepartum Dental pain will f/u with dentist  3. Dichorionic diamniotic twin pregnancy in second trimester F/u growth [place]  4. Supervision of high risk pregnancy in second trimester  - promethazine (PHENERGAN) 25 MG tablet; Take 1 tablet (25 mg total) by mouth every 6 (six) hours as needed for nausea or vomiting.  Dispense: 30 tablet; Refill: 0  Preterm labor symptoms and general obstetric precautions including but not limited to vaginal bleeding, contractions, leaking of fluid and fetal movement were reviewed in detail with the patient. Please refer to After Visit Summary for other counseling recommendations.  Return in about 2 weeks (around 01/13/2017) for 2 hr GTT.   Scheryl Darter, MD

## 2017-01-03 MED ORDER — PROMETHAZINE HCL 25 MG PO TABS: 25.0000 mg | ORAL_TABLET | Freq: Four times a day (QID) | ORAL | 0 refills | Status: DC | PRN

## 2017-01-13 ENCOUNTER — Encounter: Payer: Medicaid Other | Admitting: Obstetrics & Gynecology

## 2017-01-13 ENCOUNTER — Ambulatory Visit (INDEPENDENT_AMBULATORY_CARE_PROVIDER_SITE_OTHER): Payer: Medicaid Other | Admitting: Obstetrics and Gynecology

## 2017-01-13 VITALS — BP 119/71 | HR 102 | Wt 166.1 lb

## 2017-01-13 DIAGNOSIS — Z8619 Personal history of other infectious and parasitic diseases: Secondary | ICD-10-CM

## 2017-01-13 DIAGNOSIS — O30049 Twin pregnancy, dichorionic/diamniotic, unspecified trimester: Secondary | ICD-10-CM

## 2017-01-13 DIAGNOSIS — O09522 Supervision of elderly multigravida, second trimester: Secondary | ICD-10-CM

## 2017-01-13 DIAGNOSIS — O0992 Supervision of high risk pregnancy, unspecified, second trimester: Secondary | ICD-10-CM

## 2017-01-13 DIAGNOSIS — Z23 Encounter for immunization: Secondary | ICD-10-CM

## 2017-01-13 DIAGNOSIS — Z880 Allergy status to penicillin: Secondary | ICD-10-CM

## 2017-01-13 DIAGNOSIS — O0990 Supervision of high risk pregnancy, unspecified, unspecified trimester: Secondary | ICD-10-CM

## 2017-01-13 DIAGNOSIS — D573 Sickle-cell trait: Secondary | ICD-10-CM

## 2017-01-13 DIAGNOSIS — K429 Umbilical hernia without obstruction or gangrene: Secondary | ICD-10-CM

## 2017-01-13 DIAGNOSIS — F1190 Opioid use, unspecified, uncomplicated: Secondary | ICD-10-CM

## 2017-01-13 DIAGNOSIS — F1911 Other psychoactive substance abuse, in remission: Secondary | ICD-10-CM

## 2017-01-13 MED ORDER — TETANUS-DIPHTH-ACELL PERTUSSIS 5-2.5-18.5 LF-MCG/0.5 IM SUSP
0.5000 mL | Freq: Once | INTRAMUSCULAR | Status: AC
  Administered 2017-01-13: 0.5 mL via INTRAMUSCULAR

## 2017-01-13 NOTE — Addendum Note (Signed)
Addended by: Faythe Casa on: 01/13/2017 08:34 PM   Modules accepted: Orders

## 2017-01-13 NOTE — Progress Notes (Signed)
Tdap vaccine; declined flu vaccine

## 2017-01-13 NOTE — Patient Instructions (Signed)

## 2017-01-13 NOTE — Progress Notes (Signed)
   PRENATAL VISIT NOTE  Subjective:  [name] is a 36 y.o. [id number] at [redacted]w[redacted]d being seen today for ongoing prenatal care.  She is currently monitored for the following issues for this high-risk pregnancy and has Tobacco abuse; Bipolar disorder (HCC); History of substance abuse; Sickle cell trait (HCC); Chronic hypertension; Supervision of high risk pregnancy, antepartum; History of herpes genitalis; History of trichomoniasis; Advanced maternal age in multigravida, second trimester; Grand multiparity; Short interval between pregnancies affecting pregnancy in first trimester, antepartum; Dichorionic diamniotic twin gestation; Opiate use; Penicillin allergy; [redacted] weeks gestation of pregnancy; Hereditary disease in family possibly affecting fetus, fetus 1; and Umbilical hernia on her problem list.  Patient reports occasional contractions.  Contractions: Irregular. Vag. Bleeding: None.  Movement: Present. Denies leaking of fluid. No complaints today, overall feeling well.   The following portions of the patient's history were reviewed and updated as appropriate: allergies, current medications, past family history, past medical history, past social history, past surgical history and problem list. Problem list updated.  Objective:   Vitals:   01/13/17 1504  BP: 119/71  Pulse: (!) 102  Weight: 166 lb 1.6 oz (75.3 kg)    Fetal Status: Fetal Heart Rate (bpm): 140/145   Movement: Present     General:  Alert, oriented and cooperative. Patient is in no acute distress.  Skin: Skin is warm and dry. No rash noted.   Cardiovascular: Normal heart rate noted  Respiratory: Normal respiratory effort, no problems with respiration noted  Abdomen: Soft, gravid, appropriate for gestational age.  Pain/Pressure: Present     Pelvic: Cervical exam deferred        Extremities: Normal range of motion.  Edema: None  Mental Status:  Normal mood and affect. Normal behavior. Normal judgment and thought content.    Assessment and Plan:  Pregnancy: [id number] at [redacted]w[redacted]d  1. Supervision of high risk pregnancy, antepartum Depo PP  2. Advanced maternal age in multigravida, second trimester  3. History of herpes genitalis Needs ppx 36 weeks  4. History of substance abuse Negative UDS 09/2016  6. Dichorionic diamniotic twin pregnancy, antepartum Q 2 weeks [place]  8. Dental pain - improved, will have work done PP  9. Umbilical hernia without obstruction and without gangrene Has seen gen surg, they will operate post delivery Reviewed precautions  Preterm labor symptoms and general obstetric precautions including but not limited to vaginal bleeding, contractions, leaking of fluid and fetal movement were reviewed in detail with the patient. Please refer to After Visit Summary for other counseling recommendations.  Return in about 2 weeks (around 01/27/2017) for OB visit (MD).   Conan Bowens, MD

## 2017-01-14 ENCOUNTER — Other Ambulatory Visit (HOSPITAL_COMMUNITY): Payer: Self-pay | Admitting: Obstetrics and Gynecology

## 2017-01-14 ENCOUNTER — Ambulatory Visit (HOSPITAL_COMMUNITY)
Admission: RE | Admit: 2017-01-14 | Discharge: 2017-01-14 | Disposition: A | Discharge status: Home / Self Care | Payer: Medicaid Other | Source: Ambulatory Visit | Attending: Obstetrics and Gynecology | Admitting: Obstetrics and Gynecology

## 2017-01-14 ENCOUNTER — Encounter (HOSPITAL_COMMUNITY): Payer: Self-pay

## 2017-01-14 ENCOUNTER — Other Ambulatory Visit (HOSPITAL_COMMUNITY): Payer: Self-pay | Admitting: *Deleted

## 2017-01-14 DIAGNOSIS — O365921 Maternal care for other known or suspected poor fetal growth, second trimester, fetus 1: Secondary | ICD-10-CM

## 2017-01-14 DIAGNOSIS — O99353 Diseases of the nervous system complicating pregnancy, third trimester: Secondary | ICD-10-CM

## 2017-01-14 DIAGNOSIS — O09523 Supervision of elderly multigravida, third trimester: Secondary | ICD-10-CM

## 2017-01-14 DIAGNOSIS — O99332 Smoking (tobacco) complicating pregnancy, second trimester: Secondary | ICD-10-CM | POA: Insufficient documentation

## 2017-01-14 DIAGNOSIS — O09892 Supervision of other high risk pregnancies, second trimester: Secondary | ICD-10-CM

## 2017-01-14 DIAGNOSIS — O0942 Supervision of pregnancy with grand multiparity, second trimester: Secondary | ICD-10-CM | POA: Insufficient documentation

## 2017-01-14 DIAGNOSIS — Z362 Encounter for other antenatal screening follow-up: Secondary | ICD-10-CM

## 2017-01-14 DIAGNOSIS — O30042 Twin pregnancy, dichorionic/diamniotic, second trimester: Secondary | ICD-10-CM | POA: Insufficient documentation

## 2017-01-14 DIAGNOSIS — Z862 Personal history of diseases of the blood and blood-forming organs and certain disorders involving the immune mechanism: Secondary | ICD-10-CM | POA: Diagnosis not present

## 2017-01-14 DIAGNOSIS — O10012 Pre-existing essential hypertension complicating pregnancy, second trimester: Secondary | ICD-10-CM | POA: Diagnosis not present

## 2017-01-14 DIAGNOSIS — O09522 Supervision of elderly multigravida, second trimester: Secondary | ICD-10-CM

## 2017-01-14 DIAGNOSIS — Z3A27 27 weeks gestation of pregnancy: Secondary | ICD-10-CM | POA: Insufficient documentation

## 2017-01-14 DIAGNOSIS — G40909 Epilepsy, unspecified, not intractable, without status epilepticus: Secondary | ICD-10-CM

## 2017-01-14 DIAGNOSIS — O10013 Pre-existing essential hypertension complicating pregnancy, third trimester: Secondary | ICD-10-CM

## 2017-01-14 DIAGNOSIS — O30002 Twin pregnancy, unspecified number of placenta and unspecified number of amniotic sacs, second trimester: Secondary | ICD-10-CM

## 2017-01-14 DIAGNOSIS — O365920 Maternal care for other known or suspected poor fetal growth, second trimester, not applicable or unspecified: Secondary | ICD-10-CM | POA: Insufficient documentation

## 2017-01-14 DIAGNOSIS — O99322 Drug use complicating pregnancy, second trimester: Secondary | ICD-10-CM | POA: Insufficient documentation

## 2017-01-14 DIAGNOSIS — F1910 Other psychoactive substance abuse, uncomplicated: Secondary | ICD-10-CM | POA: Insufficient documentation

## 2017-01-14 DIAGNOSIS — Z3A35 35 weeks gestation of pregnancy: Secondary | ICD-10-CM

## 2017-01-14 DIAGNOSIS — K429 Umbilical hernia without obstruction or gangrene: Secondary | ICD-10-CM

## 2017-01-14 DIAGNOSIS — O30049 Twin pregnancy, dichorionic/diamniotic, unspecified trimester: Secondary | ICD-10-CM

## 2017-01-27 ENCOUNTER — Encounter: Payer: Medicaid Other | Admitting: Obstetrics & Gynecology

## 2017-02-07 ENCOUNTER — Encounter: Payer: Self-pay | Admitting: Family Medicine

## 2017-02-07 ENCOUNTER — Encounter: Payer: Self-pay | Admitting: *Deleted

## 2017-02-07 ENCOUNTER — Encounter (HOSPITAL_COMMUNITY): Payer: Self-pay

## 2017-02-07 ENCOUNTER — Ambulatory Visit (INDEPENDENT_AMBULATORY_CARE_PROVIDER_SITE_OTHER): Payer: Medicaid Other | Admitting: Family Medicine

## 2017-02-07 VITALS — BP 136/79 | HR 92 | Wt 167.5 lb

## 2017-02-07 DIAGNOSIS — O30043 Twin pregnancy, dichorionic/diamniotic, third trimester: Secondary | ICD-10-CM

## 2017-02-07 DIAGNOSIS — Z8619 Personal history of other infectious and parasitic diseases: Secondary | ICD-10-CM

## 2017-02-07 DIAGNOSIS — O0990 Supervision of high risk pregnancy, unspecified, unspecified trimester: Secondary | ICD-10-CM

## 2017-02-07 DIAGNOSIS — O0993 Supervision of high risk pregnancy, unspecified, third trimester: Secondary | ICD-10-CM

## 2017-02-07 DIAGNOSIS — O09522 Supervision of elderly multigravida, second trimester: Secondary | ICD-10-CM

## 2017-02-07 DIAGNOSIS — O09523 Supervision of elderly multigravida, third trimester: Secondary | ICD-10-CM

## 2017-02-07 LAB — POCT URINALYSIS DIP (DEVICE)
BILIRUBIN URINE: NEGATIVE
Glucose, UA: NEGATIVE mg/dL
HGB URINE DIPSTICK: NEGATIVE
Ketones, ur: NEGATIVE mg/dL
Leukocytes, UA: NEGATIVE
NITRITE: NEGATIVE
PH: 6.5 (ref 5.0–8.0)
PROTEIN: 30 mg/dL — AB
Specific Gravity, Urine: 1.025 (ref 1.005–1.030)
UROBILINOGEN UA: 1 mg/dL (ref 0.0–1.0)

## 2017-02-07 NOTE — Progress Notes (Signed)
    PRENATAL VISIT NOTE  Subjective:  [name] is a 36 y.o. [id number] at 3631w4d being seen today for ongoing prenatal care.  She is currently monitored for the following issues for this high-risk pregnancy and has Tobacco abuse; Bipolar disorder (HCC); History of substance abuse; Sickle cell trait (HCC); Chronic hypertension; Supervision of high risk pregnancy, antepartum; History of herpes genitalis; History of trichomoniasis; Advanced maternal age in multigravida, second trimester; Grand multiparity; Short interval between pregnancies affecting pregnancy in first trimester, antepartum; Dichorionic diamniotic twin gestation; Opiate use; Penicillin allergy; Hereditary disease in family possibly affecting fetus, fetus 1; and Umbilical hernia on her problem list.  Patient reports no complaints.  Contractions: Irregular. Vag. Bleeding: None.  Movement: Present. Denies leaking of fluid.   The following portions of the patient's history were reviewed and updated as appropriate: allergies, current medications, past family history, past medical history, past social history, past surgical history and problem list. Problem list updated.  Objective:   Vitals:   02/07/17 0927  BP: 136/79  Pulse: 92  Weight: 167 lb 8 oz (76 kg)    Fetal Status: Fetal Heart Rate (bpm): 136/147 Fundal Height: 38 cm Movement: Present     General:  Alert, oriented and cooperative. Patient is in no acute distress.  Skin: Skin is warm and dry. No rash noted.   Cardiovascular: Normal heart rate noted  Respiratory: Normal respiratory effort, no problems with respiration noted  Abdomen: Soft, gravid, appropriate for gestational age.  Pain/Pressure: Present     Pelvic: Cervical exam deferred        Extremities: Normal range of motion.  Edema: None  Mental Status:  Normal mood and affect. Normal behavior. Normal judgment and thought content.   Assessment and Plan:  Pregnancy: [id number] at 7631w4d  1. Advanced maternal  age in multigravida, second trimester 28 wk labs declined 1 or 2 hour test--check CMP and Hgb A1C-->she is approximately 2 hour pp - RPR - CBC - HIV antibody (with reflex)  2. Dichorionic diamniotic twin pregnancy in third trimester Concordant growth Has f/u scheduled 02/11/17  3. Supervision of high risk pregnancy, antepartum - Comprehensive metabolic panel - Hemoglobin A1c  4. History of herpes genitalis Will need suppression at 34 wks  Preterm labor symptoms and general obstetric precautions including but not limited to vaginal bleeding, contractions, leaking of fluid and fetal movement were reviewed in detail with the patient. Please refer to After Visit Summary for other counseling recommendations.  Return in 2 weeks (on 02/21/2017).   Reva Boresanya S Chelsae Zanella, MD

## 2017-02-07 NOTE — Patient Instructions (Signed)
Breastfeeding Deciding to breastfeed is one of the best choices you can make for you and your baby. A change in hormones during pregnancy causes your breast tissue to grow and increases the number and size of your milk ducts. These hormones also allow proteins, sugars, and fats from your blood supply to make breast milk in your milk-producing glands. Hormones prevent breast milk from being released before your baby is born as well as prompt milk flow after birth. Once breastfeeding has begun, thoughts of your baby, as well as his or her sucking or crying, can stimulate the release of milk from your milk-producing glands. Benefits of breastfeeding For Your Baby  Your first milk (colostrum) helps your baby's digestive system function better.  There are antibodies in your milk that help your baby fight off infections.  Your baby has a lower incidence of asthma, allergies, and sudden infant death syndrome.  The nutrients in breast milk are better for your baby than infant formulas and are designed uniquely for your baby's needs.  Breast milk improves your baby's brain development.  Your baby is less likely to develop other conditions, such as childhood obesity, asthma, or type 2 diabetes mellitus.  For You  Breastfeeding helps to create a very special bond between you and your baby.  Breastfeeding is convenient. Breast milk is always available at the correct temperature and costs nothing.  Breastfeeding helps to burn calories and helps you lose the weight gained during pregnancy.  Breastfeeding makes your uterus contract to its prepregnancy size faster and slows bleeding (lochia) after you give birth.  Breastfeeding helps to lower your risk of developing type 2 diabetes mellitus, osteoporosis, and breast or ovarian cancer later in life.  Signs that your baby is hungry Early Signs of Hunger  Increased alertness or activity.  Stretching.  Movement of the head from side to  side.  Movement of the head and opening of the mouth when the corner of the mouth or cheek is stroked (rooting).  Increased sucking sounds, smacking lips, cooing, sighing, or squeaking.  Hand-to-mouth movements.  Increased sucking of fingers or hands.  Late Signs of Hunger  Fussing.  Intermittent crying.  Extreme Signs of Hunger Signs of extreme hunger will require calming and consoling before your baby will be able to breastfeed successfully. Do not wait for the following signs of extreme hunger to occur before you initiate breastfeeding:  Restlessness.  A loud, strong cry.  Screaming.  Breastfeeding basics Breastfeeding Initiation  Find a comfortable place to sit or lie down, with your neck and back well supported.  Place a pillow or rolled up blanket under your baby to bring him or her to the level of your breast (if you are seated). Nursing pillows are specially designed to help support your arms and your baby while you breastfeed.  Make sure that your baby's abdomen is facing your abdomen.  Gently massage your breast. With your fingertips, massage from your chest wall toward your nipple in a circular motion. This encourages milk flow. You may need to continue this action during the feeding if your milk flows slowly.  Support your breast with 4 fingers underneath and your thumb above your nipple. Make sure your fingers are well away from your nipple and your baby's mouth.  Stroke your baby's lips gently with your finger or nipple.  When your baby's mouth is open wide enough, quickly bring your baby to your breast, placing your entire nipple and as much of the colored area   around your nipple (areola) as possible into your baby's mouth. ? More areola should be visible above your baby's upper lip than below the lower lip. ? Your baby's tongue should be between his or her lower gum and your breast.  Ensure that your baby's mouth is correctly positioned around your nipple  (latched). Your baby's lips should create a seal on your breast and be turned out (everted).  It is common for your baby to suck about 2-3 minutes in order to start the flow of breast milk.  Latching Teaching your baby how to latch on to your breast properly is very important. An improper latch can cause nipple pain and decreased milk supply for you and poor weight gain in your baby. Also, if your baby is not latched onto your nipple properly, he or she may swallow some air during feeding. This can make your baby fussy. Burping your baby when you switch breasts during the feeding can help to get rid of the air. However, teaching your baby to latch on properly is still the best way to prevent fussiness from swallowing air while breastfeeding. Signs that your baby has successfully latched on to your nipple:  Silent tugging or silent sucking, without causing you pain.  Swallowing heard between every 3-4 sucks.  Muscle movement above and in front of his or her ears while sucking.  Signs that your baby has not successfully latched on to nipple:  Sucking sounds or smacking sounds from your baby while breastfeeding.  Nipple pain.  If you think your baby has not latched on correctly, slip your finger into the corner of your baby's mouth to break the suction and place it between your baby's gums. Attempt breastfeeding initiation again. Signs of Successful Breastfeeding Signs from your baby:  A gradual decrease in the number of sucks or complete cessation of sucking.  Falling asleep.  Relaxation of his or her body.  Retention of a small amount of milk in his or her mouth.  Letting go of your breast by himself or herself.  Signs from you:  Breasts that have increased in firmness, weight, and size 1-3 hours after feeding.  Breasts that are softer immediately after breastfeeding.  Increased milk volume, as well as a change in milk consistency and color by the fifth day of  breastfeeding.  Nipples that are not sore, cracked, or bleeding.  Signs That Your Baby is Getting Enough Milk  Wetting at least 1-2 diapers during the first 24 hours after birth.  Wetting at least 5-6 diapers every 24 hours for the first week after birth. The urine should be clear or pale yellow by 5 days after birth.  Wetting 6-8 diapers every 24 hours as your baby continues to grow and develop.  At least 3 stools in a 24-hour period by age 5 days. The stool should be soft and yellow.  At least 3 stools in a 24-hour period by age 7 days. The stool should be seedy and yellow.  No loss of weight greater than 10% of birth weight during the first 3 days of age.  Average weight gain of 4-7 ounces (113-198 g) per week after age 4 days.  Consistent daily weight gain by age 5 days, without weight loss after the age of 2 weeks.  After a feeding, your baby may spit up a small amount. This is common. Breastfeeding frequency and duration Frequent feeding will help you make more milk and can prevent sore nipples and breast engorgement. Breastfeed when   you feel the need to reduce the fullness of your breasts or when your baby shows signs of hunger. This is called "breastfeeding on demand." Avoid introducing a pacifier to your baby while you are working to establish breastfeeding (the first 4-6 weeks after your baby is born). After this time you may choose to use a pacifier. Research has shown that pacifier use during the first year of a baby's life decreases the risk of sudden infant death syndrome (SIDS). Allow your baby to feed on each breast as long as he or she wants. Breastfeed until your baby is finished feeding. When your baby unlatches or falls asleep while feeding from the first breast, offer the second breast. Because newborns are often sleepy in the first few weeks of life, you may need to awaken your baby to get him or her to feed. Breastfeeding times will vary from baby to baby. However,  the following rules can serve as a guide to help you ensure that your baby is properly fed:  Newborns (babies 4 weeks of age or younger) may breastfeed every 1-3 hours.  Newborns should not go longer than 3 hours during the day or 5 hours during the night without breastfeeding.  You should breastfeed your baby a minimum of 8 times in a 24-hour period until you begin to introduce solid foods to your baby at around 6 months of age.  Breast milk pumping Pumping and storing breast milk allows you to ensure that your baby is exclusively fed your breast milk, even at times when you are unable to breastfeed. This is especially important if you are going back to work while you are still breastfeeding or when you are not able to be present during feedings. Your lactation consultant can give you guidelines on how long it is safe to store breast milk. A breast pump is a machine that allows you to pump milk from your breast into a sterile bottle. The pumped breast milk can then be stored in a refrigerator or freezer. Some breast pumps are operated by hand, while others use electricity. Ask your lactation consultant which type will work best for you. Breast pumps can be purchased, but some hospitals and breastfeeding support groups lease breast pumps on a monthly basis. A lactation consultant can teach you how to hand express breast milk, if you prefer not to use a pump. Caring for your breasts while you breastfeed Nipples can become dry, cracked, and sore while breastfeeding. The following recommendations can help keep your breasts moisturized and healthy:  Avoid using soap on your nipples.  Wear a supportive bra. Although not required, special nursing bras and tank tops are designed to allow access to your breasts for breastfeeding without taking off your entire bra or top. Avoid wearing underwire-style bras or extremely tight bras.  Air dry your nipples for 3-4minutes after each feeding.  Use only cotton  bra pads to absorb leaked breast milk. Leaking of breast milk between feedings is normal.  Use lanolin on your nipples after breastfeeding. Lanolin helps to maintain your skin's normal moisture barrier. If you use pure lanolin, you do not need to wash it off before feeding your baby again. Pure lanolin is not toxic to your baby. You may also hand express a few drops of breast milk and gently massage that milk into your nipples and allow the milk to air dry.  In the first few weeks after giving birth, some women experience extremely full breasts (engorgement). Engorgement can make your   breasts feel heavy, warm, and tender to the touch. Engorgement peaks within 3-5 days after you give birth. The following recommendations can help ease engorgement:  Completely empty your breasts while breastfeeding or pumping. You may want to start by applying warm, moist heat (in the shower or with warm water-soaked hand towels) just before feeding or pumping. This increases circulation and helps the milk flow. If your baby does not completely empty your breasts while breastfeeding, pump any extra milk after he or she is finished.  Wear a snug bra (nursing or regular) or tank top for 1-2 days to signal your body to slightly decrease milk production.  Apply ice packs to your breasts, unless this is too uncomfortable for you.  Make sure that your baby is latched on and positioned properly while breastfeeding.  If engorgement persists after 48 hours of following these recommendations, contact your health care provider or a lactation consultant. Overall health care recommendations while breastfeeding  Eat healthy foods. Alternate between meals and snacks, eating 3 of each per day. Because what you eat affects your breast milk, some of the foods may make your baby more irritable than usual. Avoid eating these foods if you are sure that they are negatively affecting your baby.  Drink milk, fruit juice, and water to  satisfy your thirst (about 10 glasses a day).  Rest often, relax, and continue to take your prenatal vitamins to prevent fatigue, stress, and anemia.  Continue breast self-awareness checks.  Avoid chewing and smoking tobacco. Chemicals from cigarettes that pass into breast milk and exposure to secondhand smoke may harm your baby.  Avoid alcohol and drug use, including marijuana. Some medicines that may be harmful to your baby can pass through breast milk. It is important to ask your health care provider before taking any medicine, including all over-the-counter and prescription medicine as well as vitamin and herbal supplements. It is possible to become pregnant while breastfeeding. If birth control is desired, ask your health care provider about options that will be safe for your baby. Contact a health care provider if:  You feel like you want to stop breastfeeding or have become frustrated with breastfeeding.  You have painful breasts or nipples.  Your nipples are cracked or bleeding.  Your breasts are red, tender, or warm.  You have a swollen area on either breast.  You have a fever or chills.  You have nausea or vomiting.  You have drainage other than breast milk from your nipples.  Your breasts do not become full before feedings by the fifth day after you give birth.  You feel sad and depressed.  Your baby is too sleepy to eat well.  Your baby is having trouble sleeping.  Your baby is wetting less than 3 diapers in a 24-hour period.  Your baby has less than 3 stools in a 24-hour period.  Your baby's skin or the white part of his or her eyes becomes yellow.  Your baby is not gaining weight by 5 days of age. Get help right away if:  Your baby is overly tired (lethargic) and does not want to wake up and feed.  Your baby develops an unexplained fever. This information is not intended to replace advice given to you by your health care provider. Make sure you discuss  any questions you have with your health care provider. Document Released: 03/25/2005 Document Revised: 09/06/2015 Document Reviewed: 09/16/2012 Elsevier Interactive Patient Education  2017 [employer]  

## 2017-02-07 NOTE — Progress Notes (Signed)
Declined doing glucose tolerance test

## 2017-02-09 LAB — COMPREHENSIVE METABOLIC PANEL
A/G RATIO: 1.6 (ref 1.2–2.2)
ALT: 8 IU/L (ref 0–32)
AST: 14 IU/L (ref 0–40)
Albumin: 3.6 g/dL (ref 3.5–5.5)
Alkaline Phosphatase: 106 IU/L (ref 39–117)
BUN/Creatinine Ratio: 13 (ref 9–23)
BUN: 7 mg/dL (ref 6–20)
Bilirubin Total: 0.7 mg/dL (ref 0.0–1.2)
CALCIUM: 8.6 mg/dL — AB (ref 8.7–10.2)
CO2: 18 mmol/L — AB (ref 20–29)
CREATININE: 0.52 mg/dL — AB (ref 0.57–1.00)
Chloride: 108 mmol/L — ABNORMAL HIGH (ref 96–106)
GFR, EST AFRICAN AMERICAN: 141 mL/min/{1.73_m2} (ref >59)
GFR, EST NON AFRICAN AMERICAN: 122 mL/min/{1.73_m2} (ref >59)
Globulin, Total: 2.2 g/dL (ref 1.5–4.5)
Glucose: 71 mg/dL (ref 65–99)
POTASSIUM: 4.3 mmol/L (ref 3.5–5.2)
Sodium: 141 mmol/L (ref 134–144)
TOTAL PROTEIN: 5.8 g/dL — AB (ref 6.0–8.5)

## 2017-02-09 LAB — CBC
HEMOGLOBIN: 10 g/dL — AB (ref 11.1–15.9)
Hematocrit: 30.4 % — ABNORMAL LOW (ref 34.0–46.6)
MCH: 28.3 pg (ref 26.6–33.0)
MCHC: 32.9 g/dL (ref 31.5–35.7)
MCV: 86 fL (ref 79–97)
Platelets: 211 10*3/uL (ref 150–379)
RBC: 3.53 x10E6/uL — AB (ref 3.77–5.28)
RDW: 17.3 % — ABNORMAL HIGH (ref 12.3–15.4)
WBC: 10.6 10*3/uL (ref 3.4–10.8)

## 2017-02-09 LAB — HIV ANTIBODY (ROUTINE TESTING W REFLEX): HIV SCREEN 4TH GENERATION: NONREACTIVE

## 2017-02-09 LAB — HEMOGLOBIN A1C: Hgb A1c MFr Bld: <4.2 % — ABNORMAL LOW (ref 4.8–5.6)

## 2017-02-09 LAB — RPR: RPR: NONREACTIVE

## 2017-02-11 ENCOUNTER — Ambulatory Visit (HOSPITAL_COMMUNITY)
Admission: RE | Admit: 2017-02-11 | Discharge: 2017-02-11 | Disposition: A | Discharge status: Home / Self Care | Payer: Medicaid Other | Source: Ambulatory Visit | Attending: Obstetrics and Gynecology | Admitting: Obstetrics and Gynecology

## 2017-02-11 ENCOUNTER — Encounter (HOSPITAL_COMMUNITY): Payer: Self-pay

## 2017-02-11 ENCOUNTER — Other Ambulatory Visit (HOSPITAL_COMMUNITY): Payer: Self-pay | Admitting: Maternal and Fetal Medicine

## 2017-02-11 DIAGNOSIS — Z862 Personal history of diseases of the blood and blood-forming organs and certain disorders involving the immune mechanism: Secondary | ICD-10-CM | POA: Diagnosis not present

## 2017-02-11 DIAGNOSIS — O10013 Pre-existing essential hypertension complicating pregnancy, third trimester: Secondary | ICD-10-CM | POA: Insufficient documentation

## 2017-02-11 DIAGNOSIS — Z3A31 31 weeks gestation of pregnancy: Secondary | ICD-10-CM | POA: Diagnosis not present

## 2017-02-11 DIAGNOSIS — O99320 Drug use complicating pregnancy, unspecified trimester: Secondary | ICD-10-CM

## 2017-02-11 DIAGNOSIS — O30043 Twin pregnancy, dichorionic/diamniotic, third trimester: Secondary | ICD-10-CM | POA: Insufficient documentation

## 2017-02-11 DIAGNOSIS — O99323 Drug use complicating pregnancy, third trimester: Secondary | ICD-10-CM | POA: Diagnosis not present

## 2017-02-11 DIAGNOSIS — G40909 Epilepsy, unspecified, not intractable, without status epilepticus: Secondary | ICD-10-CM | POA: Diagnosis not present

## 2017-02-11 DIAGNOSIS — F1910 Other psychoactive substance abuse, uncomplicated: Secondary | ICD-10-CM | POA: Insufficient documentation

## 2017-02-11 DIAGNOSIS — O30049 Twin pregnancy, dichorionic/diamniotic, unspecified trimester: Secondary | ICD-10-CM

## 2017-02-11 DIAGNOSIS — O0943 Supervision of pregnancy with grand multiparity, third trimester: Secondary | ICD-10-CM | POA: Diagnosis not present

## 2017-02-11 DIAGNOSIS — K429 Umbilical hernia without obstruction or gangrene: Secondary | ICD-10-CM

## 2017-02-11 DIAGNOSIS — O09523 Supervision of elderly multigravida, third trimester: Secondary | ICD-10-CM | POA: Diagnosis not present

## 2017-02-11 DIAGNOSIS — O99353 Diseases of the nervous system complicating pregnancy, third trimester: Secondary | ICD-10-CM | POA: Insufficient documentation

## 2017-02-11 DIAGNOSIS — O99333 Smoking (tobacco) complicating pregnancy, third trimester: Secondary | ICD-10-CM

## 2017-02-11 HISTORY — DX: Other psychoactive substance abuse, in remission: F19.11

## 2017-02-21 ENCOUNTER — Ambulatory Visit (INDEPENDENT_AMBULATORY_CARE_PROVIDER_SITE_OTHER): Payer: Medicaid Other | Admitting: Family Medicine

## 2017-02-21 VITALS — BP 127/81 | HR 89 | Wt 171.4 lb

## 2017-02-21 DIAGNOSIS — O10919 Unspecified pre-existing hypertension complicating pregnancy, unspecified trimester: Secondary | ICD-10-CM | POA: Insufficient documentation

## 2017-02-21 DIAGNOSIS — O09522 Supervision of elderly multigravida, second trimester: Secondary | ICD-10-CM

## 2017-02-21 DIAGNOSIS — O0990 Supervision of high risk pregnancy, unspecified, unspecified trimester: Secondary | ICD-10-CM

## 2017-02-21 DIAGNOSIS — O09523 Supervision of elderly multigravida, third trimester: Secondary | ICD-10-CM

## 2017-02-21 DIAGNOSIS — O0993 Supervision of high risk pregnancy, unspecified, third trimester: Secondary | ICD-10-CM

## 2017-02-21 DIAGNOSIS — K219 Gastro-esophageal reflux disease without esophagitis: Secondary | ICD-10-CM

## 2017-02-21 DIAGNOSIS — O10913 Unspecified pre-existing hypertension complicating pregnancy, third trimester: Secondary | ICD-10-CM

## 2017-02-21 DIAGNOSIS — Z8619 Personal history of other infectious and parasitic diseases: Secondary | ICD-10-CM

## 2017-02-21 DIAGNOSIS — O0991 Supervision of high risk pregnancy, unspecified, first trimester: Secondary | ICD-10-CM

## 2017-02-21 DIAGNOSIS — O30043 Twin pregnancy, dichorionic/diamniotic, third trimester: Secondary | ICD-10-CM

## 2017-02-21 MED ORDER — PROMETHAZINE HCL 25 MG PO TABS: 25.0000 mg | ORAL_TABLET | Freq: Four times a day (QID) | ORAL | 2 refills | Status: DC | PRN

## 2017-02-21 MED ORDER — OMEPRAZOLE MAGNESIUM 20 MG PO TBEC: 20.0000 mg | DELAYED_RELEASE_TABLET | Freq: Every day | ORAL | 3 refills | Status: DC

## 2017-02-21 MED ORDER — VALACYCLOVIR HCL 1 G PO TABS: 500.0000 mg | ORAL_TABLET | Freq: Two times a day (BID) | ORAL | 2 refills | Status: DC

## 2017-02-21 NOTE — Progress Notes (Signed)
   PRENATAL VISIT NOTE  Subjective:  [name] is a 36 y.o. [id number] at 6842w4d being seen today for ongoing prenatal care.  She is currently monitored for the following issues for this high-risk pregnancy and has Tobacco abuse; Bipolar disorder (HCC); History of substance abuse; Sickle cell trait (HCC); Chronic hypertension; Supervision of high risk pregnancy, antepartum; History of herpes genitalis; History of trichomoniasis; Advanced maternal age in multigravida, second trimester; Grand multiparity; Short interval between pregnancies affecting pregnancy in first trimester, antepartum; Dichorionic diamniotic twin gestation; Opiate use; Penicillin allergy; Hereditary disease in family possibly affecting fetus, fetus 1; Umbilical hernia; Chronic hypertension in pregnancy; and GERD (gastroesophageal reflux disease) on their problem list.  Patient reports contractions since last few days and heartburn.  Contractions: Irregular. Vag. Bleeding: None.  Movement: Present. Denies leaking of fluid.   The following portions of the patient's history were reviewed and updated as appropriate: allergies, current medications, past family history, past medical history, past social history, past surgical history and problem list. Problem list updated.  Objective:   Vitals:   02/21/17 1104  BP: 127/81  Pulse: 89  Weight: 171 lb 6.4 oz (77.7 kg)    Fetal Status: Fetal Heart Rate (bpm): 142/132 Fundal Height: 42 cm Movement: Present  Presentation: Vertex  General:  Alert, oriented and cooperative. Patient is in no acute distress.  Skin: Skin is warm and dry. No rash noted.   Cardiovascular: Normal heart rate noted  Respiratory: Normal respiratory effort, no problems with respiration noted  Abdomen: Soft, gravid, appropriate for gestational age.  Pain/Pressure: Present     Pelvic: Cervical exam performed Dilation: 2 Effacement (%): 50 Station: -2  Extremities: Normal range of motion.  Edema: Trace    Mental Status:  Normal mood and affect. Normal behavior. Normal judgment and thought content.   Assessment and Plan:  Pregnancy: [id number] at 942w4d  1. Advanced maternal age in multigravida, second trimester Declined genetic testing  2. Dichorionic diamniotic twin pregnancy in third trimester Concordant growth on last scan  3. Supervision of high risk pregnancy, antepartum Continue  prenatal care. - promethazine (PHENERGAN) 25 MG tablet; Take 1 tablet (25 mg total) every 6 (six) hours as needed by mouth for nausea or vomiting.  Dispense: 30 tablet; Refill: 2 4. Chronic hypertension in pregnancy Needs to start testing now 32 wks--scheduled - US MFM FETAL BPP WO NON STRESS; Future - US MFM FETAL BPP WO NST ADDL GESTATION; Future  5. Gastroesophageal reflux disease without esophagitis - omeprazole (PRILOSEC OTC) 20 MG tablet; Take 1 tablet (20 mg total) daily by mouth.  Dispense: 30 tablet; Refill: 3  6. History of herpes genitalis Begin Suppression - valACYclovir (VALTREX) 1000 MG tablet; Take 0.5 tablets (500 mg total) 2 (two) times daily by mouth.  Dispense: 60 tablet; Refill: 2  7. Threatened PTL Cervix is very posterior but dilated--consider BMZ if progression  Preterm labor symptoms and general obstetric precautions including but not limited to vaginal bleeding, contractions, leaking of fluid and fetal movement were reviewed in detail with the patient. Please refer to After Visit Summary for other counseling recommendations.  Return in 2 weeks (on 03/07/2017) for OB visit and BPP.   Reva Boresanya S Roza Creamer, MD

## 2017-02-21 NOTE — Patient Instructions (Signed)
 Third Trimester of Pregnancy The third trimester is from week 28 through week 40 (months 7 through 9). The third trimester is a time when the unborn baby (fetus) is growing rapidly. At the end of the ninth month, the fetus is about 20 inches in length and weighs 6-10 pounds. Body changes during your third trimester Your body will continue to go through many changes during pregnancy. The changes vary from woman to woman. During the third trimester:  Your weight will continue to increase. You can expect to gain 25-35 pounds (11-16 kg) by the end of the pregnancy.  You may begin to get stretch marks on your hips, abdomen, and breasts.  You may urinate more often because the fetus is moving lower into your pelvis and pressing on your bladder.  You may develop or continue to have heartburn. This is caused by increased hormones that slow down muscles in the digestive tract.  You may develop or continue to have constipation because increased hormones slow digestion and cause the muscles that push waste through your intestines to relax.  You may develop hemorrhoids. These are swollen veins (varicose veins) in the rectum that can itch or be painful.  You may develop swollen, bulging veins (varicose veins) in your legs.  You may have increased body aches in the pelvis, back, or thighs. This is due to weight gain and increased hormones that are relaxing your joints.  You may have changes in your hair. These can include thickening of your hair, rapid growth, and changes in texture. Some women also have hair loss during or after pregnancy, or hair that feels dry or thin. Your hair will most likely return to normal after your baby is born.  Your breasts will continue to grow and they will continue to become tender. A yellow fluid (colostrum) may leak from your breasts. This is the first milk you are producing for your baby.  Your belly button may stick out.  You may notice more swelling in your  hands, face, or ankles.  You may have increased tingling or numbness in your hands, arms, and legs. The skin on your belly may also feel numb.  You may feel short of breath because of your expanding uterus.  You may have more problems sleeping. This can be caused by the size of your belly, increased need to urinate, and an increase in your body's metabolism.  You may notice the fetus "dropping," or moving lower in your abdomen (lightening).  You may have increased vaginal discharge.  You may notice your joints feel loose and you may have pain around your pelvic bone.  What to expect at prenatal visits You will have prenatal exams every 2 weeks until week 36. Then you will have weekly prenatal exams. During a routine prenatal visit:  You will be weighed to make sure you and the baby are growing normally.  Your blood pressure will be taken.  Your abdomen will be measured to track your baby's growth.  The fetal heartbeat will be listened to.  Any test results from the previous visit will be discussed.  You may have a cervical check near your due date to see if your cervix has softened or thinned (effaced).  You will be tested for Group B streptococcus. This happens between 35 and 37 weeks.  Your health care provider may ask you:  What your birth plan is.  How you are feeling.  If you are feeling the baby move.  If you have   had any abnormal symptoms, such as leaking fluid, bleeding, severe headaches, or abdominal cramping.  If you are using any tobacco products, including cigarettes, chewing tobacco, and electronic cigarettes.  If you have any questions.  Other tests or screenings that may be performed during your third trimester include:  Blood tests that check for low iron levels (anemia).  Fetal testing to check the health, activity level, and growth of the fetus. Testing is done if you have certain medical conditions or if there are problems during the  pregnancy.  Nonstress test (NST). This test checks the health of your baby to make sure there are no signs of problems, such as the baby not getting enough oxygen. During this test, a belt is placed around your belly. The baby is made to move, and its heart rate is monitored during movement.  What is false labor? False labor is a condition in which you feel small, irregular tightenings of the muscles in the womb (contractions) that usually go away with rest, changing position, or drinking water. These are called Braxton Hicks contractions. Contractions may last for hours, days, or even weeks before true labor sets in. If contractions come at regular intervals, become more frequent, increase in intensity, or become painful, you should see your health care provider. What are the signs of labor?  Abdominal cramps.  Regular contractions that start at 10 minutes apart and become stronger and more frequent with time.  Contractions that start on the top of the uterus and spread down to the lower abdomen and back.  Increased pelvic pressure and dull back pain.  A watery or bloody mucus discharge that comes from the vagina.  Leaking of amniotic fluid. This is also known as your "water breaking." It could be a slow trickle or a gush. Let your health care provider know if it has a color or strange odor. If you have any of these signs, call your health care provider right away, even if it is before your due date. Follow these instructions at home: Medicines  Follow your health care provider's instructions regarding medicine use. Specific medicines may be either safe or unsafe to take during pregnancy.  Take a prenatal vitamin that contains at least 600 micrograms (mcg) of folic acid.  If you develop constipation, try taking a stool softener if your health care provider approves. Eating and drinking  Eat a balanced diet that includes fresh fruits and vegetables, whole grains, good sources of protein  such as meat, eggs, or tofu, and low-fat dairy. Your health care provider will help you determine the amount of weight gain that is right for you.  Avoid raw meat and uncooked cheese. These carry germs that can cause birth defects in the baby.  If you have low calcium intake from food, talk to your health care provider about whether you should take a daily calcium supplement.  Eat four or five small meals rather than three large meals a day.  Limit foods that are high in fat and processed sugars, such as fried and sweet foods.  To prevent constipation: ? Drink enough fluid to keep your urine clear or pale yellow. ? Eat foods that are high in fiber, such as fresh fruits and vegetables, whole grains, and beans. Activity  Exercise only as directed by your health care provider. Most women can continue their usual exercise routine during pregnancy. Try to exercise for 30 minutes at least 5 days a week. Stop exercising if you experience uterine contractions.  Avoid   heavy lifting.  Do not exercise in extreme heat or humidity, or at high altitudes.  Wear low-heel, comfortable shoes.  Practice good posture.  You may continue to have sex unless your health care provider tells you otherwise. Relieving pain and discomfort  Take frequent breaks and rest with your legs elevated if you have leg cramps or low back pain.  Take warm sitz baths to soothe any pain or discomfort caused by hemorrhoids. Use hemorrhoid cream if your health care provider approves.  Wear a good support bra to prevent discomfort from breast tenderness.  If you develop varicose veins: ? Wear support pantyhose or compression stockings as told by your healthcare provider. ? Elevate your feet for 15 minutes, 3-4 times a day. Prenatal care  Write down your questions. Take them to your prenatal visits.  Keep all your prenatal visits as told by your health care provider. This is important. Safety  Wear your seat belt at  all times when driving.  Make a list of emergency phone numbers, including numbers for family, friends, the hospital, and police and fire departments. General instructions  Avoid cat litter boxes and soil used by cats. These carry germs that can cause birth defects in the baby. If you have a cat, ask someone to clean the litter box for you.  Do not travel far distances unless it is absolutely necessary and only with the approval of your health care provider.  Do not use hot tubs, steam rooms, or saunas.  Do not drink alcohol.  Do not use any products that contain nicotine or tobacco, such as cigarettes and e-cigarettes. If you need help quitting, ask your health care provider.  Do not use any medicinal herbs or unprescribed drugs. These chemicals affect the formation and growth of the baby.  Do not douche or use tampons or scented sanitary pads.  Do not cross your legs for long periods of time.  To prepare for the arrival of your baby: ? Take prenatal classes to understand, practice, and ask questions about labor and delivery. ? Make a trial run to the hospital. ? Visit the hospital and tour the maternity area. ? Arrange for maternity or paternity leave through employers. ? Arrange for family and friends to take care of pets while you are in the hospital. ? Purchase a rear-facing car seat and make sure you know how to install it in your car. ? Pack your hospital bag. ? Prepare the baby's nursery. Make sure to remove all pillows and stuffed animals from the baby's crib to prevent suffocation.  Visit your dentist if you have not gone during your pregnancy. Use a soft toothbrush to brush your teeth and be gentle when you floss. Contact a health care provider if:  You are unsure if you are in labor or if your water has broken.  You become dizzy.  You have mild pelvic cramps, pelvic pressure, or nagging pain in your abdominal area.  You have lower back pain.  You have persistent  nausea, vomiting, or diarrhea.  You have an unusual or bad smelling vaginal discharge.  You have pain when you urinate. Get help right away if:  Your water breaks before 37 weeks.  You have regular contractions less than 5 minutes apart before 37 weeks.  You have a fever.  You are leaking fluid from your vagina.  You have spotting or bleeding from your vagina.  You have severe abdominal pain or cramping.  You have rapid weight loss or weight   gain.  You have shortness of breath with chest pain.  You notice sudden or extreme swelling of your face, hands, ankles, feet, or legs.  Your baby makes fewer than 10 movements in 2 hours.  You have severe headaches that do not go away when you take medicine.  You have vision changes. Summary  The third trimester is from week 28 through week 40, months 7 through 9. The third trimester is a time when the unborn baby (fetus) is growing rapidly.  During the third trimester, your discomfort may increase as you and your baby continue to gain weight. You may have abdominal, leg, and back pain, sleeping problems, and an increased need to urinate.  During the third trimester your breasts will keep growing and they will continue to become tender. A yellow fluid (colostrum) may leak from your breasts. This is the first milk you are producing for your baby.  False labor is a condition in which you feel small, irregular tightenings of the muscles in the womb (contractions) that eventually go away. These are called Braxton Hicks contractions. Contractions may last for hours, days, or even weeks before true labor sets in.  Signs of labor can include: abdominal cramps; regular contractions that start at 10 minutes apart and become stronger and more frequent with time; watery or bloody mucus discharge that comes from the vagina; increased pelvic pressure and dull back pain; and leaking of amniotic fluid. This information is not intended to replace advice  given to you by your health care provider. Make sure you discuss any questions you have with your health care provider. Document Released: 03/19/2001 Document Revised: 08/31/2015 Document Reviewed: 05/26/2012 [employer] Interactive Patient Education  2017 [employer]   Breastfeeding Deciding to breastfeed is one of the best choices you can make for you and your baby. A change in hormones during pregnancy causes your breast tissue to grow and increases the number and size of your milk ducts. These hormones also allow proteins, sugars, and fats from your blood supply to make breast milk in your milk-producing glands. Hormones prevent breast milk from being released before your baby is born as well as prompt milk flow after birth. Once breastfeeding has begun, thoughts of your baby, as well as his or her sucking or crying, can stimulate the release of milk from your milk-producing glands. Benefits of breastfeeding For Your Baby  Your first milk (colostrum) helps your baby's digestive system function better.  There are antibodies in your milk that help your baby fight off infections.  Your baby has a lower incidence of asthma, allergies, and sudden infant death syndrome.  The nutrients in breast milk are better for your baby than infant formulas and are designed uniquely for your baby's needs.  Breast milk improves your baby's brain development.  Your baby is less likely to develop other conditions, such as childhood obesity, asthma, or type 2 diabetes mellitus.  For You  Breastfeeding helps to create a very special bond between you and your baby.  Breastfeeding is convenient. Breast milk is always available at the correct temperature and costs nothing.  Breastfeeding helps to burn calories and helps you lose the weight gained during pregnancy.  Breastfeeding makes your uterus contract to its prepregnancy size faster and slows bleeding (lochia) after you give birth.  Breastfeeding helps  to lower your risk of developing type 2 diabetes mellitus, osteoporosis, and breast or ovarian cancer later in life.  Signs that your baby is hungry Early Signs of Hunger    Increased alertness or activity.  Stretching.  Movement of the head from side to side.  Movement of the head and opening of the mouth when the corner of the mouth or cheek is stroked (rooting).  Increased sucking sounds, smacking lips, cooing, sighing, or squeaking.  Hand-to-mouth movements.  Increased sucking of fingers or hands.  Late Signs of Hunger  Fussing.  Intermittent crying.  Extreme Signs of Hunger Signs of extreme hunger will require calming and consoling before your baby will be able to breastfeed successfully. Do not wait for the following signs of extreme hunger to occur before you initiate breastfeeding:  Restlessness.  A loud, strong cry.  Screaming.  Breastfeeding basics Breastfeeding Initiation  Find a comfortable place to sit or lie down, with your neck and back well supported.  Place a pillow or rolled up blanket under your baby to bring him or her to the level of your breast (if you are seated). Nursing pillows are specially designed to help support your arms and your baby while you breastfeed.  Make sure that your baby's abdomen is facing your abdomen.  Gently massage your breast. With your fingertips, massage from your chest wall toward your nipple in a circular motion. This encourages milk flow. You may need to continue this action during the feeding if your milk flows slowly.  Support your breast with 4 fingers underneath and your thumb above your nipple. Make sure your fingers are well away from your nipple and your baby's mouth.  Stroke your baby's lips gently with your finger or nipple.  When your baby's mouth is open wide enough, quickly bring your baby to your breast, placing your entire nipple and as much of the colored area around your nipple (areola) as possible into  your baby's mouth. ? More areola should be visible above your baby's upper lip than below the lower lip. ? Your baby's tongue should be between his or her lower gum and your breast.  Ensure that your baby's mouth is correctly positioned around your nipple (latched). Your baby's lips should create a seal on your breast and be turned out (everted).  It is common for your baby to suck about 2-3 minutes in order to start the flow of breast milk.  Latching Teaching your baby how to latch on to your breast properly is very important. An improper latch can cause nipple pain and decreased milk supply for you and poor weight gain in your baby. Also, if your baby is not latched onto your nipple properly, he or she may swallow some air during feeding. This can make your baby fussy. Burping your baby when you switch breasts during the feeding can help to get rid of the air. However, teaching your baby to latch on properly is still the best way to prevent fussiness from swallowing air while breastfeeding. Signs that your baby has successfully latched on to your nipple:  Silent tugging or silent sucking, without causing you pain.  Swallowing heard between every 3-4 sucks.  Muscle movement above and in front of his or her ears while sucking.  Signs that your baby has not successfully latched on to nipple:  Sucking sounds or smacking sounds from your baby while breastfeeding.  Nipple pain.  If you think your baby has not latched on correctly, slip your finger into the corner of your baby's mouth to break the suction and place it between your baby's gums. Attempt breastfeeding initiation again. Signs of Successful Breastfeeding Signs from your baby:  A   gradual decrease in the number of sucks or complete cessation of sucking.  Falling asleep.  Relaxation of his or her body.  Retention of a small amount of milk in his or her mouth.  Letting go of your breast by himself or herself.  Signs from  you:  Breasts that have increased in firmness, weight, and size 1-3 hours after feeding.  Breasts that are softer immediately after breastfeeding.  Increased milk volume, as well as a change in milk consistency and color by the fifth day of breastfeeding.  Nipples that are not sore, cracked, or bleeding.  Signs That Your Baby is Getting Enough Milk  Wetting at least 1-2 diapers during the first 24 hours after birth.  Wetting at least 5-6 diapers every 24 hours for the first week after birth. The urine should be clear or pale yellow by 5 days after birth.  Wetting 6-8 diapers every 24 hours as your baby continues to grow and develop.  At least 3 stools in a 24-hour period by age 5 days. The stool should be soft and yellow.  At least 3 stools in a 24-hour period by age 7 days. The stool should be seedy and yellow.  No loss of weight greater than 10% of birth weight during the first 3 days of age.  Average weight gain of 4-7 ounces (113-198 g) per week after age 4 days.  Consistent daily weight gain by age 5 days, without weight loss after the age of 2 weeks.  After a feeding, your baby may spit up a small amount. This is common. Breastfeeding frequency and duration Frequent feeding will help you make more milk and can prevent sore nipples and breast engorgement. Breastfeed when you feel the need to reduce the fullness of your breasts or when your baby shows signs of hunger. This is called "breastfeeding on demand." Avoid introducing a pacifier to your baby while you are working to establish breastfeeding (the first 4-6 weeks after your baby is born). After this time you may choose to use a pacifier. Research has shown that pacifier use during the first year of a baby's life decreases the risk of sudden infant death syndrome (SIDS). Allow your baby to feed on each breast as long as he or she wants. Breastfeed until your baby is finished feeding. When your baby unlatches or falls asleep  while feeding from the first breast, offer the second breast. Because newborns are often sleepy in the first few weeks of life, you may need to awaken your baby to get him or her to feed. Breastfeeding times will vary from baby to baby. However, the following rules can serve as a guide to help you ensure that your baby is properly fed:  Newborns (babies 4 weeks of age or younger) may breastfeed every 1-3 hours.  Newborns should not go longer than 3 hours during the day or 5 hours during the night without breastfeeding.  You should breastfeed your baby a minimum of 8 times in a 24-hour period until you begin to introduce solid foods to your baby at around 6 months of age.  Breast milk pumping Pumping and storing breast milk allows you to ensure that your baby is exclusively fed your breast milk, even at times when you are unable to breastfeed. This is especially important if you are going back to work while you are still breastfeeding or when you are not able to be present during feedings. Your lactation consultant can give you guidelines on how   long it is safe to store breast milk. A breast pump is a machine that allows you to pump milk from your breast into a sterile bottle. The pumped breast milk can then be stored in a refrigerator or freezer. Some breast pumps are operated by hand, while others use electricity. Ask your lactation consultant which type will work best for you. Breast pumps can be purchased, but some hospitals and breastfeeding support groups lease breast pumps on a monthly basis. A lactation consultant can teach you how to hand express breast milk, if you prefer not to use a pump. Caring for your breasts while you breastfeed Nipples can become dry, cracked, and sore while breastfeeding. The following recommendations can help keep your breasts moisturized and healthy:  Avoid using soap on your nipples.  Wear a supportive bra. Although not required, special nursing bras and tank  tops are designed to allow access to your breasts for breastfeeding without taking off your entire bra or top. Avoid wearing underwire-style bras or extremely tight bras.  Air dry your nipples for 3-4minutes after each feeding.  Use only cotton bra pads to absorb leaked breast milk. Leaking of breast milk between feedings is normal.  Use lanolin on your nipples after breastfeeding. Lanolin helps to maintain your skin's normal moisture barrier. If you use pure lanolin, you do not need to wash it off before feeding your baby again. Pure lanolin is not toxic to your baby. You may also hand express a few drops of breast milk and gently massage that milk into your nipples and allow the milk to air dry.  In the first few weeks after giving birth, some women experience extremely full breasts (engorgement). Engorgement can make your breasts feel heavy, warm, and tender to the touch. Engorgement peaks within 3-5 days after you give birth. The following recommendations can help ease engorgement:  Completely empty your breasts while breastfeeding or pumping. You may want to start by applying warm, moist heat (in the shower or with warm water-soaked hand towels) just before feeding or pumping. This increases circulation and helps the milk flow. If your baby does not completely empty your breasts while breastfeeding, pump any extra milk after he or she is finished.  Wear a snug bra (nursing or regular) or tank top for 1-2 days to signal your body to slightly decrease milk production.  Apply ice packs to your breasts, unless this is too uncomfortable for you.  Make sure that your baby is latched on and positioned properly while breastfeeding.  If engorgement persists after 48 hours of following these recommendations, contact your health care provider or a lactation consultant. Overall health care recommendations while breastfeeding  Eat healthy foods. Alternate between meals and snacks, eating 3 of each per  day. Because what you eat affects your breast milk, some of the foods may make your baby more irritable than usual. Avoid eating these foods if you are sure that they are negatively affecting your baby.  Drink milk, fruit juice, and water to satisfy your thirst (about 10 glasses a day).  Rest often, relax, and continue to take your prenatal vitamins to prevent fatigue, stress, and anemia.  Continue breast self-awareness checks.  Avoid chewing and smoking tobacco. Chemicals from cigarettes that pass into breast milk and exposure to secondhand smoke may harm your baby.  Avoid alcohol and drug use, including marijuana. Some medicines that may be harmful to your baby can pass through breast milk. It is important to ask your health care   provider before taking any medicine, including all over-the-counter and prescription medicine as well as vitamin and herbal supplements. It is possible to become pregnant while breastfeeding. If birth control is desired, ask your health care provider about options that will be safe for your baby. Contact a health care provider if:  You feel like you want to stop breastfeeding or have become frustrated with breastfeeding.  You have painful breasts or nipples.  Your nipples are cracked or bleeding.  Your breasts are red, tender, or warm.  You have a swollen area on either breast.  You have a fever or chills.  You have nausea or vomiting.  You have drainage other than breast milk from your nipples.  Your breasts do not become full before feedings by the fifth day after you give birth.  You feel sad and depressed.  Your baby is too sleepy to eat well.  Your baby is having trouble sleeping.  Your baby is wetting less than 3 diapers in a 24-hour period.  Your baby has less than 3 stools in a 24-hour period.  Your baby's skin or the white part of his or her eyes becomes yellow.  Your baby is not gaining weight by 5 days of age. Get help right away  if:  Your baby is overly tired (lethargic) and does not want to wake up and feed.  Your baby develops an unexplained fever. This information is not intended to replace advice given to you by your health care provider. Make sure you discuss any questions you have with your health care provider. Document Released: 03/25/2005 Document Revised: 09/06/2015 Document Reviewed: 09/16/2012 Elsevier Interactive Patient Education  2017 Elsevier Inc.  

## 2017-02-24 ENCOUNTER — Ambulatory Visit (HOSPITAL_COMMUNITY)
Admission: RE | Admit: 2017-02-24 | Discharge: 2017-02-24 | Disposition: A | Discharge status: Home / Self Care | Payer: Medicaid Other | Source: Ambulatory Visit | Attending: Family Medicine | Admitting: Family Medicine

## 2017-02-24 ENCOUNTER — Encounter (HOSPITAL_COMMUNITY): Payer: Self-pay

## 2017-02-24 DIAGNOSIS — O0943 Supervision of pregnancy with grand multiparity, third trimester: Secondary | ICD-10-CM | POA: Insufficient documentation

## 2017-02-24 DIAGNOSIS — O09893 Supervision of other high risk pregnancies, third trimester: Secondary | ICD-10-CM | POA: Diagnosis not present

## 2017-02-24 DIAGNOSIS — O99013 Anemia complicating pregnancy, third trimester: Secondary | ICD-10-CM | POA: Insufficient documentation

## 2017-02-24 DIAGNOSIS — O99353 Diseases of the nervous system complicating pregnancy, third trimester: Secondary | ICD-10-CM | POA: Diagnosis not present

## 2017-02-24 DIAGNOSIS — O09522 Supervision of elderly multigravida, second trimester: Secondary | ICD-10-CM

## 2017-02-24 DIAGNOSIS — O09523 Supervision of elderly multigravida, third trimester: Secondary | ICD-10-CM | POA: Diagnosis present

## 2017-02-24 DIAGNOSIS — Z3A33 33 weeks gestation of pregnancy: Secondary | ICD-10-CM | POA: Insufficient documentation

## 2017-02-24 DIAGNOSIS — G40909 Epilepsy, unspecified, not intractable, without status epilepticus: Secondary | ICD-10-CM | POA: Insufficient documentation

## 2017-02-24 DIAGNOSIS — D573 Sickle-cell trait: Secondary | ICD-10-CM | POA: Insufficient documentation

## 2017-02-24 DIAGNOSIS — O30043 Twin pregnancy, dichorionic/diamniotic, third trimester: Secondary | ICD-10-CM | POA: Diagnosis present

## 2017-02-24 DIAGNOSIS — O10919 Unspecified pre-existing hypertension complicating pregnancy, unspecified trimester: Secondary | ICD-10-CM

## 2017-02-24 DIAGNOSIS — O10013 Pre-existing essential hypertension complicating pregnancy, third trimester: Secondary | ICD-10-CM | POA: Insufficient documentation

## 2017-03-03 ENCOUNTER — Inpatient Hospital Stay (HOSPITAL_COMMUNITY): Payer: Medicaid Other | Admitting: Anesthesiology

## 2017-03-03 ENCOUNTER — Other Ambulatory Visit: Payer: Self-pay

## 2017-03-03 ENCOUNTER — Encounter (HOSPITAL_COMMUNITY): Payer: Self-pay | Admitting: *Deleted

## 2017-03-03 ENCOUNTER — Inpatient Hospital Stay (HOSPITAL_COMMUNITY)
Admission: AD | Admit: 2017-03-03 | Discharge: 2017-03-16 | DRG: 832 | Disposition: A | Discharge status: Home / Self Care | Payer: Medicaid Other | Source: Ambulatory Visit | Attending: Family Medicine | Admitting: Family Medicine

## 2017-03-03 ENCOUNTER — Telehealth: Payer: Self-pay | Admitting: General Practice

## 2017-03-03 DIAGNOSIS — K219 Gastro-esophageal reflux disease without esophagitis: Secondary | ICD-10-CM | POA: Diagnosis present

## 2017-03-03 DIAGNOSIS — O30043 Twin pregnancy, dichorionic/diamniotic, third trimester: Secondary | ICD-10-CM | POA: Diagnosis present

## 2017-03-03 DIAGNOSIS — Z87891 Personal history of nicotine dependence: Secondary | ICD-10-CM

## 2017-03-03 DIAGNOSIS — O99613 Diseases of the digestive system complicating pregnancy, third trimester: Secondary | ICD-10-CM | POA: Diagnosis present

## 2017-03-03 DIAGNOSIS — Z3A34 34 weeks gestation of pregnancy: Secondary | ICD-10-CM

## 2017-03-03 DIAGNOSIS — Z641 Problems related to multiparity: Secondary | ICD-10-CM

## 2017-03-03 DIAGNOSIS — Z880 Allergy status to penicillin: Secondary | ICD-10-CM | POA: Diagnosis not present

## 2017-03-03 DIAGNOSIS — D573 Sickle-cell trait: Secondary | ICD-10-CM | POA: Diagnosis present

## 2017-03-03 DIAGNOSIS — Z8619 Personal history of other infectious and parasitic diseases: Secondary | ICD-10-CM | POA: Diagnosis present

## 2017-03-03 DIAGNOSIS — Z3A35 35 weeks gestation of pregnancy: Secondary | ICD-10-CM | POA: Diagnosis not present

## 2017-03-03 DIAGNOSIS — O99013 Anemia complicating pregnancy, third trimester: Secondary | ICD-10-CM | POA: Diagnosis present

## 2017-03-03 DIAGNOSIS — A6000 Herpesviral infection of urogenital system, unspecified: Secondary | ICD-10-CM | POA: Diagnosis present

## 2017-03-03 DIAGNOSIS — O98313 Other infections with a predominantly sexual mode of transmission complicating pregnancy, third trimester: Secondary | ICD-10-CM | POA: Diagnosis present

## 2017-03-03 DIAGNOSIS — Z7982 Long term (current) use of aspirin: Secondary | ICD-10-CM | POA: Diagnosis not present

## 2017-03-03 DIAGNOSIS — O10013 Pre-existing essential hypertension complicating pregnancy, third trimester: Secondary | ICD-10-CM | POA: Diagnosis present

## 2017-03-03 DIAGNOSIS — O10919 Unspecified pre-existing hypertension complicating pregnancy, unspecified trimester: Secondary | ICD-10-CM | POA: Diagnosis present

## 2017-03-03 DIAGNOSIS — O0943 Supervision of pregnancy with grand multiparity, third trimester: Secondary | ICD-10-CM

## 2017-03-03 DIAGNOSIS — O30049 Twin pregnancy, dichorionic/diamniotic, unspecified trimester: Secondary | ICD-10-CM | POA: Diagnosis present

## 2017-03-03 LAB — CBC
HCT: 30.2 % — ABNORMAL LOW (ref 36.0–46.0)
Hemoglobin: 9.8 g/dL — ABNORMAL LOW (ref 12.0–15.0)
MCH: 27.5 pg (ref 26.0–34.0)
MCHC: 32.5 g/dL (ref 30.0–36.0)
MCV: 84.6 fL (ref 78.0–100.0)
PLATELETS: 206 10*3/uL (ref 150–400)
RBC: 3.57 MIL/uL — AB (ref 3.87–5.11)
RDW: 17.7 % — ABNORMAL HIGH (ref 11.5–15.5)
WBC: 12.3 10*3/uL — ABNORMAL HIGH (ref 4.0–10.5)

## 2017-03-03 LAB — MAGNESIUM: MAGNESIUM: 4.4 mg/dL — AB (ref 1.7–2.4)

## 2017-03-03 LAB — TYPE AND SCREEN
ABO/RH(D): A POS
Antibody Screen: NEGATIVE

## 2017-03-03 LAB — OB RESULTS CONSOLE GBS: GBS: POSITIVE

## 2017-03-03 LAB — RAPID URINE DRUG SCREEN, HOSP PERFORMED
AMPHETAMINES: NOT DETECTED
Barbiturates: NOT DETECTED
Benzodiazepines: NOT DETECTED
Cocaine: NOT DETECTED
OPIATES: NOT DETECTED
Tetrahydrocannabinol: NOT DETECTED

## 2017-03-03 MED ORDER — LACTATED RINGERS IV SOLN
500.0000 mL | INTRAVENOUS | Status: DC | PRN
  Administered 2017-03-03: 500 mL via INTRAVENOUS
  Administered 2017-03-03: 1000 mL via INTRAVENOUS

## 2017-03-03 MED ORDER — LACTATED RINGERS IV SOLN
INTRAVENOUS | Status: DC
  Administered 2017-03-03: 13:00:00 via INTRAVENOUS

## 2017-03-03 MED ORDER — PHENYLEPHRINE 40 MCG/ML (10ML) SYRINGE FOR IV PUSH (FOR BLOOD PRESSURE SUPPORT)
80.0000 ug | PREFILLED_SYRINGE | INTRAVENOUS | Status: AC | PRN
  Administered 2017-03-03 (×3): 80 ug via INTRAVENOUS

## 2017-03-03 MED ORDER — OXYCODONE-ACETAMINOPHEN 5-325 MG PO TABS: 2.0000 | ORAL_TABLET | ORAL | Status: DC | PRN

## 2017-03-03 MED ORDER — PROMETHAZINE HCL 25 MG/ML IJ SOLN
25.0000 mg | Freq: Four times a day (QID) | INTRAMUSCULAR | Status: DC | PRN
  Administered 2017-03-03 (×2): 25 mg via INTRAVENOUS
  Filled 2017-03-03 (×2): qty 1

## 2017-03-03 MED ORDER — LIDOCAINE HCL (PF) 1 % IJ SOLN
INTRAMUSCULAR | Status: DC | PRN
  Administered 2017-03-03 – 2017-03-04 (×3): 5 mL
  Administered 2017-03-04: 6 mL via EPIDURAL
  Administered 2017-03-04: 4 mL via EPIDURAL

## 2017-03-03 MED ORDER — FLEET ENEMA 7-19 GM/118ML RE ENEM: 1.0000 | ENEMA | RECTAL | Status: DC | PRN

## 2017-03-03 MED ORDER — BETAMETHASONE SOD PHOS & ACET 6 (3-3) MG/ML IJ SUSP
12.0000 mg | INTRAMUSCULAR | Status: DC
  Administered 2017-03-03: 12 mg via INTRAMUSCULAR
  Filled 2017-03-03 (×2): qty 2

## 2017-03-03 MED ORDER — CALCIUM CARBONATE ANTACID 500 MG PO CHEW: 2.0000 | CHEWABLE_TABLET | ORAL | Status: DC | PRN

## 2017-03-03 MED ORDER — FENTANYL CITRATE (PF) 100 MCG/2ML IJ SOLN
100.0000 ug | INTRAMUSCULAR | Status: DC | PRN
  Administered 2017-03-03: 100 ug via INTRAVENOUS

## 2017-03-03 MED ORDER — EPHEDRINE 5 MG/ML INJ
10.0000 mg | INTRAVENOUS | Status: DC | PRN
  Administered 2017-03-03: 10 mg via INTRAVENOUS
  Filled 2017-03-03: qty 4
  Filled 2017-03-03: qty 2
  Filled 2017-03-03: qty 4

## 2017-03-03 MED ORDER — MAGNESIUM SULFATE 40 G IN LACTATED RINGERS - SIMPLE
2.0000 g/h | INTRAVENOUS | Status: DC
  Administered 2017-03-03: 2 g/h via INTRAVENOUS
  Filled 2017-03-03: qty 40

## 2017-03-03 MED ORDER — EPHEDRINE 5 MG/ML INJ
10.0000 mg | INTRAVENOUS | Status: AC | PRN
  Administered 2017-03-03 (×2): 10 mg via INTRAVENOUS

## 2017-03-03 MED ORDER — FENTANYL CITRATE (PF) 100 MCG/2ML IJ SOLN
INTRAMUSCULAR | Status: AC
  Filled 2017-03-03: qty 2

## 2017-03-03 MED ORDER — FENTANYL 2.5 MCG/ML BUPIVACAINE 1/10 % EPIDURAL INFUSION (WH - ANES)
14.0000 mL/h | INTRAMUSCULAR | Status: DC | PRN
  Administered 2017-03-03 – 2017-03-04 (×2): 14 mL/h via EPIDURAL
  Filled 2017-03-03 (×2): qty 100

## 2017-03-03 MED ORDER — OXYTOCIN BOLUS FROM INFUSION: 500.0000 mL | Freq: Once | INTRAVENOUS | Status: DC

## 2017-03-03 MED ORDER — OXYCODONE-ACETAMINOPHEN 5-325 MG PO TABS
1.0000 | ORAL_TABLET | ORAL | Status: DC | PRN
Start: 2017-03-03 — End: 2017-03-04

## 2017-03-03 MED ORDER — BUPIVACAINE HCL (PF) 0.5 % IJ SOLN
INTRAMUSCULAR | Status: AC
  Filled 2017-03-03: qty 30

## 2017-03-03 MED ORDER — PRENATAL MULTIVITAMIN CH: 1.0000 | ORAL_TABLET | Freq: Every day | ORAL | Status: DC

## 2017-03-03 MED ORDER — ACETAMINOPHEN 325 MG PO TABS: 650.0000 mg | ORAL_TABLET | ORAL | Status: DC | PRN

## 2017-03-03 MED ORDER — SOD CITRATE-CITRIC ACID 500-334 MG/5ML PO SOLN
30.0000 mL | ORAL | Status: DC | PRN
  Administered 2017-03-03: 30 mL via ORAL
  Filled 2017-03-03 (×2): qty 15

## 2017-03-03 MED ORDER — ZOLPIDEM TARTRATE 5 MG PO TABS
5.0000 mg | ORAL_TABLET | Freq: Every evening | ORAL | Status: DC | PRN
  Administered 2017-03-03: 5 mg via ORAL
  Filled 2017-03-03: qty 1

## 2017-03-03 MED ORDER — MAGNESIUM SULFATE BOLUS VIA INFUSION
4.0000 g | Freq: Once | INTRAVENOUS | Status: AC
  Administered 2017-03-03: 4 g via INTRAVENOUS
  Filled 2017-03-03: qty 500

## 2017-03-03 MED ORDER — PHENYLEPHRINE 40 MCG/ML (10ML) SYRINGE FOR IV PUSH (FOR BLOOD PRESSURE SUPPORT)
80.0000 ug | PREFILLED_SYRINGE | Freq: Once | INTRAVENOUS | Status: AC | PRN
  Administered 2017-03-03 – 2017-03-04 (×10): 80 ug via INTRAVENOUS
  Filled 2017-03-03 (×2): qty 10

## 2017-03-03 MED ORDER — LACTATED RINGERS IV SOLN
INTRAVENOUS | Status: DC
  Administered 2017-03-03 – 2017-03-04 (×3): via INTRAVENOUS

## 2017-03-03 MED ORDER — HYDROXYZINE HCL 50 MG/ML IM SOLN
50.0000 mg | Freq: Four times a day (QID) | INTRAMUSCULAR | Status: DC | PRN
  Administered 2017-03-03 (×2): 50 mg via INTRAMUSCULAR
  Filled 2017-03-03 (×3): qty 1

## 2017-03-03 MED ORDER — ONDANSETRON HCL 4 MG/2ML IJ SOLN: 4.0000 mg | Freq: Four times a day (QID) | INTRAMUSCULAR | Status: DC | PRN

## 2017-03-03 MED ORDER — PHENYLEPHRINE 40 MCG/ML (10ML) SYRINGE FOR IV PUSH (FOR BLOOD PRESSURE SUPPORT)
80.0000 ug | PREFILLED_SYRINGE | INTRAVENOUS | Status: AC | PRN
  Administered 2017-03-03 (×3): 80 ug via INTRAVENOUS
  Filled 2017-03-03 (×3): qty 10

## 2017-03-03 MED ORDER — OXYTOCIN 40 UNITS IN LACTATED RINGERS INFUSION - SIMPLE MED: 2.5000 [IU]/h | INTRAVENOUS | Status: DC

## 2017-03-03 MED ORDER — DIPHENHYDRAMINE HCL 50 MG/ML IJ SOLN
12.5000 mg | INTRAMUSCULAR | Status: AC | PRN
  Administered 2017-03-04 (×3): 12.5 mg via INTRAVENOUS
  Filled 2017-03-03: qty 1

## 2017-03-03 MED ORDER — LACTATED RINGERS IV SOLN
500.0000 mL | Freq: Once | INTRAVENOUS | Status: AC
Start: 2017-03-03 — End: 2017-03-03
  Administered 2017-03-03: 500 mL via INTRAVENOUS

## 2017-03-03 MED ORDER — LIDOCAINE HCL (PF) 1 % IJ SOLN
30.0000 mL | INTRAMUSCULAR | Status: DC | PRN
Start: 2017-03-03 — End: 2017-03-04
  Filled 2017-03-03: qty 30

## 2017-03-03 MED ORDER — DOCUSATE SODIUM 100 MG PO CAPS: 100.0000 mg | ORAL_CAPSULE | Freq: Every day | ORAL | Status: DC

## 2017-03-03 NOTE — Consult Note (Signed)
[facility] by [facility] to provide prenatal consultation for grand multip (G 15 P 8) who presented in preterm labor today at 34.[redacted] wks EGA with discordant dichorionic(boy/girl) twins.  BOW intact, no fever. Has been given betamethasone x 1 and MgSO4 bolus; 2nd dose of BMZ planned for tomorrow. GBS negative.  Discussed with patient and her brother usual expectations for preterm infants at 5034 weeks gestation, including possible needs for DR resuscitation, respiratory support, and IV access. Projected possible length of stay in NICU until 37 - [redacted] wks EGA.  Discussed advantages of feeding with mother's milk.  She plans to pump postnatally.  Patient was attentive, had appropriate questions, and was appreciative of my input. She states FOB will not be involved - wants her brother and mother to be designated as support.  Thank you for consulting Neonatology.  Total time 25 minutes.  JWimmer, MD

## 2017-03-03 NOTE — Progress Notes (Signed)
LABOR PROGRESS NOTE  [name] is a 36 y.o. [id number] at 2049w0d admitted for premature labor.  Subjective: Patient is still hypotensive but appears more comfortable.  Objective: BP 94/68   Pulse (!) 156   Temp 98.5 F (36.9 C)   Resp 20   Ht 5\' 3"  (1.6 m)   Wt 172 lb (78 kg)   LMP 07/08/2016   SpO2 98%   BMI 30.47 kg/m  or  Vitals:   03/03/17 2130 03/03/17 2131 03/03/17 2140 03/03/17 2141  BP:  (!) 91/54  94/68  Pulse:  (!) 122  (!) 156  Resp:  18  20  Temp:      TempSrc:      SpO2: 96%  98%   Weight:      Height:        SVE: 1605 Dilation: 6 Effacement (%): 70 Cervical Position: Posterior Station: -3 Presentation: Vertex(cord palpable through bag) Exam by:: anyanwu  FHTA: HR 140, Moderate variability, +accels, no decels FHTB: HR120, moderate variability, + accels, no decels  Labs: Lab Results  Component Value Date   WBC 12.3 (H) 03/03/2017   HGB 9.8 (L) 03/03/2017   HCT 30.2 (L) 03/03/2017   MCV 84.6 03/03/2017   PLT 206 03/03/2017    Patient Active Problem List   Diagnosis Date Noted  . Preterm labor in third trimester 03/03/2017  . Preterm labor 03/03/2017  . Chronic hypertension in pregnancy 02/21/2017  . GERD (gastroesophageal reflux disease) 02/21/2017  . Umbilical hernia 01/13/2017  . Hereditary disease in family possibly affecting fetus, fetus 1   . Penicillin allergy 10/02/2016  . Opiate use 09/25/2016  . Supervision of high risk pregnancy, antepartum 09/19/2016  . History of herpes genitalis 09/19/2016  . History of trichomoniasis 09/19/2016  . Advanced maternal age in multigravida, second trimester 09/19/2016  . Grand multiparity 09/19/2016  . Short interval between pregnancies affecting pregnancy in first trimester, antepartum 09/19/2016  . Dichorionic diamniotic twin gestation 09/19/2016  . Chronic hypertension 07/03/2016  . Tobacco abuse 12/19/2014  . Bipolar disorder (HCC) 12/19/2014  . History of substance abuse 12/19/2014   . Sickle cell trait (HCC) 12/19/2014    Assessment / Plan: 36 y.o. [id number] at 6349w0d here for preterm labor.   Labor: Mag off, continue expectant management given cord palpated during cervical exam Fetal Wellbeing:  Cat 1 Pain Control:  Epidural placed Anticipated MOD:  SVD   Lovena NeighboursAbdoulaye Shreya Lacasse, MD 03/03/2017, 10:30 PM

## 2017-03-03 NOTE — Telephone Encounter (Signed)
Patient called to see when her next OB appointment will be.  Patient did not check out from her last appointment.  Explained to patient that she will be a NST/BPP & to see provider.  She stated that she do not want to do that test because she is hurting and that she just don't won't to do it.  Patient is scheduled on 03/06/17 at 2:40pm.

## 2017-03-03 NOTE — H&P (Signed)
[name] is a 36 y.o. female G15 P8 at 3878w0d presenting for Preterm contractions since this morning. Denies leaking or bleeding. Did pass mucous.  Babies moving well.  Patient Active Problem List   Diagnosis Date Noted  . Preterm labor in third trimester 03/03/2017  . Chronic hypertension in pregnancy 02/21/2017  . GERD (gastroesophageal reflux disease) 02/21/2017  . Umbilical hernia 01/13/2017  . Hereditary disease in family possibly affecting fetus, fetus 1   . Penicillin allergy 10/02/2016  . Opiate use 09/25/2016  . Supervision of high risk pregnancy, antepartum 09/19/2016  . History of herpes genitalis 09/19/2016  . History of trichomoniasis 09/19/2016  . Advanced maternal age in multigravida, second trimester 09/19/2016  . Grand multiparity 09/19/2016  . Short interval between pregnancies affecting pregnancy in first trimester, antepartum 09/19/2016  . Dichorionic diamniotic twin gestation 09/19/2016  . Chronic hypertension 07/03/2016  . Tobacco abuse 12/19/2014  . Bipolar disorder (HCC) 12/19/2014  . History of substance abuse 12/19/2014  . Sickle cell trait (HCC) 12/19/2014     . OB History    Gravida Para Term Preterm AB Living   15 8 8  0 6 8   SAB TAB Ectopic Multiple Live Births   6 0 0 0 8     Past Medical History:  Diagnosis Date  . Anemia   . Bipolar 1 disorder (HCC)   . Chlamydia   . Chronic abdominal pain   . Chronic nausea   . Deliberate self-cutting   . History of substance abuse   . HSV-2 infection 2015  . Hypertension   . Infection    MRSA in 1995, negative since  . Schizophrenia (HCC)   . Seizures (HCC)    Not recently  . Sickle cell trait (HCC)   . Trichomonas infection    Past Surgical History:  Procedure Laterality Date  . DILATION AND CURETTAGE OF UTERUS    . HEMORROIDECTOMY  2010  . plastic surgery on face     Family History: family history includes Cancer in her mother; Diabetes in her mother; Heart failure in her mother;  Hypertension in her maternal grandmother and mother; Stroke in her mother. Social History:  reports that she quit smoking about 22 months ago. Her smoking use included cigarettes. She has a 0.75 pack-year smoking history. she has never used smokeless tobacco. She reports that she does not drink alcohol or use drugs.     Maternal Diabetes: No Genetic Screening: Declined Maternal Ultrasounds/Referrals: Normal Fetal Ultrasounds or other Referrals:  None Maternal Substance Abuse:  Yes:  Type: Other:  Past opiod use, last several Tox screens negative Significant Maternal Medications:  Meds include: Other: Valtrex Significant Maternal Lab Results:  None Other Comments:  Twin IUP, well grown, no discrepancy  Review of Systems  Constitutional: Negative for chills and fever.  Respiratory: Negative for shortness of breath.   Cardiovascular: Negative for leg swelling.  Gastrointestinal: Positive for abdominal pain. Negative for nausea and vomiting.  Musculoskeletal: Positive for back pain.   Maternal Medical History:  Reason for admission: Contractions.  Nausea.  Contractions: Onset was 3-5 hours ago.   Frequency: irregular.   Perceived severity is moderate.    Fetal activity: Perceived fetal activity is normal.   Last perceived fetal movement was within the past hour.    Prenatal complications: PIH, preterm labor and substance abuse (last several tox screens negative).   No bleeding or pre-eclampsia.   Prenatal Complications - Diabetes: none.    Dilation: 3.5 Effacement (%):  70 Station: [place], -3 Exam by:: Wynelle BourgeoisMarie Toniann Dickerson, CNM Blood pressure 119/78, pulse (!) 101, temperature 98 F (36.7 C), temperature source Oral, resp. rate 18, last menstrual period 07/08/2016, SpO2 99 %, not currently breastfeeding. Maternal Exam:  Uterine Assessment: Contraction strength is firm.  Contraction frequency is irregular.   Abdomen: Patient reports no abdominal tenderness. Fetal presentation:  vertex  Introitus: Normal vulva. Normal vagina.  Ferning test: not done.  Nitrazine test: not done.  Pelvis: adequate for delivery.   Cervix: Cervix evaluated by digital exam.     Fetal Exam Fetal Monitor Review: Mode: ultrasound.   Baseline rate: 145, 150.  Variability: moderate (6-25 bpm).   Pattern: accelerations present and no decelerations.    Fetal State Assessment: Category I - tracings are normal.     Physical Exam  Constitutional: She is oriented to person, place, and time. She appears well-developed and well-nourished. No distress (but uncomfortable).  HENT:  Head: Normocephalic.  Cardiovascular: Normal rate and regular rhythm.  Respiratory: Effort normal. No respiratory distress.  GI: Soft. She exhibits no distension. There is no tenderness. There is no rebound and no guarding.  Genitourinary:  Genitourinary Comments: Dilation: 3.5 Effacement (%): 70 Station: [place], -3 Presentation: Vertex Exam by:: Wynelle BourgeoisMarie Georgi Navarrete, CNM   Musculoskeletal: Normal range of motion. She exhibits no edema.  Neurological: She is alert and oriented to person, place, and time.  Skin: Skin is warm and dry.  Psychiatric: She has a normal mood and affect.    Prenatal labs: ABO, Rh: A/Positive/-- (06/14 0835) Antibody: Negative (06/14 0835) Rubella: 1.43 (06/14 0835) RPR: Non Reactive (11/02 1010)  HBsAg: Negative (06/14 0835)  HIV:    GBS: Negative (01/11 0000)   Assessment/Plan: Twin IUP at 6819w0d Preterm labor Chronic hypertension  [facility] Dr Debroah LoopArnold Routine orders Magnesium Sulfate infusion to facilitate Steroids Betamethasone GBS culture Observe  Plans DepoProvera.   Discussed this pregnancy happened when she had early IC postpartum, came for PP exam and had to wait a week for DepoProvera, then never came back.  Got pregnant right afterward.  Discussed abstinence and early Depo  Wynelle BourgeoisMarie Charels Stambaugh 03/03/2017, 11:30 AM

## 2017-03-03 NOTE — Progress Notes (Signed)
Pt becoming agitated again. Moving in bed.  Attempting to obtain accurate BP

## 2017-03-03 NOTE — Progress Notes (Signed)
Continuing to search for FHR. Antony Odeaaroline Neil, CNM at bedside. Discussed importance of FHR tracing with pt. Pt continues acting anxious and cannot stay still to find FHR.

## 2017-03-03 NOTE — MAU Note (Signed)
CTX started around 0700, pt called EMS around 1030. CTX Q3-5 min.  No LOF no vaginal bleeding. Twins

## 2017-03-03 NOTE — Anesthesia Preprocedure Evaluation (Signed)
Anesthesia Evaluation  Patient identified by MRN, date of birth, ID band Patient awake    Reviewed: Allergy & Precautions, H&P , NPO status , Patient's Chart, lab work & pertinent test results  History of Anesthesia Complications Negative for: history of anesthetic complications  Airway Mallampati: II  TM Distance: >3 FB Neck ROM: full    Dental no notable dental hx. (+) Teeth Intact   Pulmonary former smoker,    Pulmonary exam normal breath sounds clear to auscultation       Cardiovascular hypertension, Normal cardiovascular exam Rhythm:regular Rate:Normal     Neuro/Psych Seizures -,  Bipolar Disorder Schizophrenia    GI/Hepatic negative GI ROS, (+)     substance abuse  ,   Endo/Other  negative endocrine ROS  Renal/GU negative Renal ROS  negative genitourinary   Musculoskeletal   Abdominal   Peds  Hematology negative hematology ROS (+)   Anesthesia Other Findings   Reproductive/Obstetrics (+) Pregnancy                             Anesthesia Physical Anesthesia Plan  ASA: II  Anesthesia Plan: Epidural   Post-op Pain Management:    Induction:   PONV Risk Score and Plan:   Airway Management Planned:   Additional Equipment:   Intra-op Plan:   Post-operative Plan:   Informed Consent: I have reviewed the patients History and Physical, chart, labs and discussed the procedure including the risks, benefits and alternatives for the proposed anesthesia with the patient or authorized representative who has indicated his/her understanding and acceptance.     Plan Discussed with:   Anesthesia Plan Comments:         Anesthesia Quick Evaluation

## 2017-03-03 NOTE — Progress Notes (Signed)
Unable to trace FHR due to maternal agitation and itching.  Pt unable to remain still.  Discussed with patient need to monitor babies.  Continued effort to obtain tracing

## 2017-03-03 NOTE — Progress Notes (Signed)
Patient experienced low BP after getting epidural, and the twins' FHR were difficult to obtain. I performed bedside ultrasound and obtained, reassuring FHT x 2 with both reactive and baseline in 140s.  Both twins noted to have cephalic presentations.  Cervix 6/70/-3 /BBOW with cord palpated in bag  Will continue expectant management for now. NICU aware. On betamethasone and magnesium sulfate for now. Will plan for vaginal delivery in the event of progressing PTL; OR also ready in case of emergency.    Jaynie CollinsUGONNA  ANYANWU, MD, FACOG  Attending Obstetrician & Gynecologist, [facility], [facility]

## 2017-03-03 NOTE — Progress Notes (Signed)
Unable to adequately obtain accurate FHT due to materal agitation and nausea.  2RNs at bedside in conjunction with Dr Acey Lavarignan and Dr. Macon LargeAnyanwu.  Bedside ultrasound confirms 2 fetal heart rates in the 130's.  Patient counseled on the need to remain calm.

## 2017-03-03 NOTE — Anesthesia Procedure Notes (Signed)
Epidural Patient location during procedure: OB  Staffing Anesthesiologist: Mckenlee Mangham, MD Performed: anesthesiologist   Preanesthetic Checklist Completed: patient identified, site marked, surgical consent, pre-op evaluation, timeout performed, IV checked, risks and benefits discussed and monitors and equipment checked  Epidural Patient position: sitting Prep: DuraPrep Patient monitoring: heart rate, continuous pulse ox and blood pressure Approach: right paramedian Location: L4-L5 Injection technique: LOR saline  Needle:  Needle type: Tuohy  Needle gauge: 17 G Needle length: 9 cm and 9 Needle insertion depth: 6 cm Catheter type: closed end flexible Catheter size: 20 Guage Catheter at skin depth: 10 cm Test dose: negative  Assessment Events: blood not aspirated, injection not painful, no injection resistance, negative IV test and no paresthesia  Additional Notes Patient identified. Risks/Benefits/Options discussed with patient including but not limited to bleeding, infection, nerve damage, paralysis, failed block, incomplete pain control, headache, blood pressure changes, nausea, vomiting, reactions to medication both or allergic, itching and postpartum back pain. Confirmed with bedside nurse the patient's most recent platelet count. Confirmed with patient that they are not currently taking any anticoagulation, have any bleeding history or any family history of bleeding disorders. Patient expressed understanding and wished to proceed. All questions were answered. Sterile technique was used throughout the entire procedure. Please see nursing notes for vital signs. Test dose was given through epidural needle and negative prior to continuing to dose epidural or start infusion. Warning signs of high block given to the patient including shortness of breath, tingling/numbness in hands, complete motor block, or any concerning symptoms with instructions to call for help. Patient was given  instructions on fall risk and not to get out of bed. All questions and concerns addressed with instructions to call with any issues.     

## 2017-03-03 NOTE — Progress Notes (Signed)
Patient with increased pelvic pressure.  Normotensive currently. Patient desires augmentation of labor. Also desires something to help her relax and sleep.   Reassuring FHT x 2 with both reactive and baseline in 150s.   Cervix 6/70/-3 with very ballotable fetal head/BBOWwith cord palpated in bag; no change from earlier exam  Will continue expectant management for now. No indication for augmentation at 511w0d.  Second betamethasone dose due tomorrow around 12 noon. Will plan for vaginal delivery in the event of progressing PTL; OR also ready in case of emergency such as cord prolapse. Ambien ordered for sleep.   Jaynie CollinsUGONNA  Cali Cuartas, MD, FACOG  Attending Obstetrician & Gynecologist, [facility], [facility]

## 2017-03-03 NOTE — Progress Notes (Signed)
Labor Progress Note [name] is a 36 y.o. [id number] at 6816w0d presented for preterm labor  S:  Patient experiencing persistently low blood pressures with epidural.  Anesthesiologist at bedside. Complaining of shortness of breath.   O:  BP (!) 88/44   Pulse (!) 121   Temp 98 F (36.7 C) (Oral)   Resp 20   Ht 5\' 3"  (1.6 m)   Wt 172 lb (78 kg)   LMP 07/08/2016   SpO2 98%   BMI 30.47 kg/m   Fetal Tracing:  Baby A  Baseline: 130 Variability: moderate Accels: 15x15 Decels: none  Baby B  Baseline: 135 Variability: moderate Accels: 15x15 Decels: none  Toco: 5-8   CVE: Dilation: 6 Effacement (%): 70 Cervical Position: Posterior Station: -3 Presentation: Vertex(cord palpable through bag) Exam by:: anyanwu  Vitals:   03/03/17 1935 03/03/17 1936 03/03/17 1940 03/03/17 1941  BP:  107/80  (!) 103/49  Pulse:    (!) 129  Resp:  20  20  Temp:      TempSrc:      SpO2: 98%  99%   Weight:      Height:       Lung sounds clear and diminished in bases bilaterally. O2 applied  A&P: 36 y.o. [id number] 4116w0d preterm labor, twins #Labor: Contractions spacing out. Mag discontinued and Mag level ordered. Anesthesia at bedside managing epidural and may replace if hypotension persists #Pain: epidural #FWB: Cat 1 #GBS pending  Rolm Bookbinderaroline M Aleister Lady, CNM 7:40 PM

## 2017-03-04 ENCOUNTER — Encounter (HOSPITAL_COMMUNITY): Payer: Self-pay | Admitting: Anesthesiology

## 2017-03-04 LAB — RPR: RPR: NONREACTIVE

## 2017-03-04 LAB — COMPREHENSIVE METABOLIC PANEL
ALBUMIN: 2.6 g/dL — AB (ref 3.5–5.0)
ALK PHOS: 129 U/L — AB (ref 38–126)
ALT: 8 U/L — ABNORMAL LOW (ref 14–54)
AST: 20 U/L (ref 15–41)
Anion gap: 8 (ref 5–15)
BILIRUBIN TOTAL: 1.2 mg/dL (ref 0.3–1.2)
BUN: 6 mg/dL (ref 6–20)
CHLORIDE: 109 mmol/L (ref 101–111)
CO2: 18 mmol/L — AB (ref 22–32)
Calcium: 8.2 mg/dL — ABNORMAL LOW (ref 8.9–10.3)
Creatinine, Ser: 0.5 mg/dL (ref 0.44–1.00)
GFR calc non Af Amer: >60 mL/min (ref >60)
Glucose, Bld: 99 mg/dL (ref 65–99)
POTASSIUM: 4.2 mmol/L (ref 3.5–5.1)
Sodium: 135 mmol/L (ref 135–145)
Total Protein: 6 g/dL — ABNORMAL LOW (ref 6.5–8.1)

## 2017-03-04 LAB — CBC
HEMATOCRIT: 25.5 % — AB (ref 36.0–46.0)
Hemoglobin: 8.3 g/dL — ABNORMAL LOW (ref 12.0–15.0)
MCH: 27.6 pg (ref 26.0–34.0)
MCHC: 32.5 g/dL (ref 30.0–36.0)
MCV: 84.7 fL (ref 78.0–100.0)
Platelets: 181 10*3/uL (ref 150–400)
RBC: 3.01 MIL/uL — AB (ref 3.87–5.11)
RDW: 18 % — AB (ref 11.5–15.5)
WBC: 12 10*3/uL — AB (ref 4.0–10.5)

## 2017-03-04 MED ORDER — OMEPRAZOLE MAGNESIUM 20 MG PO TBEC: 20.0000 mg | DELAYED_RELEASE_TABLET | Freq: Every day | ORAL | Status: DC

## 2017-03-04 MED ORDER — PROMETHAZINE HCL 25 MG PO TABS
25.0000 mg | ORAL_TABLET | Freq: Four times a day (QID) | ORAL | Status: DC | PRN
Start: 2017-03-04 — End: 2017-03-16
  Administered 2017-03-04 – 2017-03-15 (×16): 25 mg via ORAL
  Filled 2017-03-04 (×19): qty 1

## 2017-03-04 MED ORDER — PENICILLIN G POT IN DEXTROSE 60000 UNIT/ML IV SOLN
3.0000 10*6.[IU] | Freq: Once | INTRAVENOUS | Status: AC
Start: 2017-03-04 — End: 2017-03-04
  Administered 2017-03-04: 3 10*6.[IU] via INTRAVENOUS
  Filled 2017-03-04: qty 50

## 2017-03-04 MED ORDER — HYDROXYZINE HCL 50 MG/ML IM SOLN
25.0000 mg | INTRAMUSCULAR | Status: DC | PRN
  Administered 2017-03-04: 25 mg via INTRAMUSCULAR
  Filled 2017-03-04 (×2): qty 0.5

## 2017-03-04 MED ORDER — ZOLPIDEM TARTRATE 5 MG PO TABS
5.0000 mg | ORAL_TABLET | Freq: Every evening | ORAL | Status: DC | PRN
  Filled 2017-03-04: qty 1

## 2017-03-04 MED ORDER — DOCUSATE SODIUM 100 MG PO CAPS
100.0000 mg | ORAL_CAPSULE | Freq: Every day | ORAL | Status: DC
  Administered 2017-03-04 – 2017-03-16 (×13): 100 mg via ORAL
  Filled 2017-03-04 (×14): qty 1

## 2017-03-04 MED ORDER — DIPHENHYDRAMINE HCL 50 MG/ML IJ SOLN
25.0000 mg | Freq: Four times a day (QID) | INTRAMUSCULAR | Status: DC | PRN
  Administered 2017-03-04: 25 mg via INTRAVENOUS
  Filled 2017-03-04: qty 1

## 2017-03-04 MED ORDER — PANTOPRAZOLE SODIUM 40 MG PO TBEC
40.0000 mg | DELAYED_RELEASE_TABLET | Freq: Every day | ORAL | Status: DC
  Administered 2017-03-04 – 2017-03-16 (×13): 40 mg via ORAL
  Filled 2017-03-04 (×14): qty 1

## 2017-03-04 MED ORDER — VALACYCLOVIR HCL 500 MG PO TABS
500.0000 mg | ORAL_TABLET | Freq: Two times a day (BID) | ORAL | Status: DC
  Administered 2017-03-04 – 2017-03-16 (×24): 500 mg via ORAL
  Filled 2017-03-04 (×27): qty 1

## 2017-03-04 MED ORDER — HYDROXYZINE HCL 50 MG PO TABS: 25.0000 mg | ORAL_TABLET | ORAL | Status: DC | PRN

## 2017-03-04 MED ORDER — FERROUS SULFATE 325 (65 FE) MG PO TABS
325.0000 mg | ORAL_TABLET | Freq: Every day | ORAL | Status: DC
  Administered 2017-03-05 – 2017-03-16 (×12): 325 mg via ORAL
  Filled 2017-03-04 (×12): qty 1

## 2017-03-04 MED ORDER — BETAMETHASONE SOD PHOS & ACET 6 (3-3) MG/ML IJ SUSP
12.0000 mg | Freq: Once | INTRAMUSCULAR | Status: AC
  Administered 2017-03-04: 12 mg via INTRAMUSCULAR
  Filled 2017-03-04: qty 2

## 2017-03-04 MED ORDER — ACETAMINOPHEN 325 MG PO TABS
650.0000 mg | ORAL_TABLET | ORAL | Status: DC | PRN
  Administered 2017-03-04 – 2017-03-06 (×4): 650 mg via ORAL
  Filled 2017-03-04 (×4): qty 2

## 2017-03-04 MED ORDER — DIPHENHYDRAMINE HCL 25 MG PO CAPS
25.0000 mg | ORAL_CAPSULE | Freq: Four times a day (QID) | ORAL | Status: DC | PRN
  Administered 2017-03-05 – 2017-03-14 (×13): 25 mg via ORAL
  Filled 2017-03-04 (×15): qty 1

## 2017-03-04 MED ORDER — CALCIUM CARBONATE ANTACID 500 MG PO CHEW
2.0000 | CHEWABLE_TABLET | ORAL | Status: DC | PRN
  Administered 2017-03-04 (×2): 400 mg via ORAL
  Filled 2017-03-04 (×2): qty 2

## 2017-03-04 MED ORDER — PROMETHAZINE HCL 25 MG/ML IJ SOLN
12.5000 mg | Freq: Four times a day (QID) | INTRAMUSCULAR | Status: DC | PRN
  Administered 2017-03-04: 25 mg via INTRAVENOUS
  Filled 2017-03-04: qty 1

## 2017-03-04 MED ORDER — PROMETHAZINE HCL 25 MG/ML IJ SOLN
25.0000 mg | Freq: Once | INTRAMUSCULAR | Status: AC
  Administered 2017-03-04: 25 mg via INTRAVENOUS
  Filled 2017-03-04 (×2): qty 1

## 2017-03-04 MED ORDER — PRENATAL MULTIVITAMIN CH
1.0000 | ORAL_TABLET | Freq: Every day | ORAL | Status: DC
  Administered 2017-03-05 – 2017-03-16 (×12): 1 via ORAL
  Filled 2017-03-04 (×12): qty 1

## 2017-03-04 MED ORDER — ASPIRIN EC 81 MG PO TBEC
81.0000 mg | DELAYED_RELEASE_TABLET | Freq: Every day | ORAL | Status: DC
  Administered 2017-03-04 – 2017-03-16 (×13): 81 mg via ORAL
  Filled 2017-03-04 (×15): qty 1

## 2017-03-04 MED ORDER — FAMOTIDINE 20 MG PO TABS
20.0000 mg | ORAL_TABLET | Freq: Once | ORAL | Status: AC
  Administered 2017-03-04: 20 mg via ORAL
  Filled 2017-03-04: qty 1

## 2017-03-04 MED ORDER — FAMOTIDINE 20 MG PO TABS
20.0000 mg | ORAL_TABLET | Freq: Every day | ORAL | Status: DC
  Administered 2017-03-04: 20 mg via ORAL
  Filled 2017-03-04: qty 1

## 2017-03-04 MED ORDER — PENICILLIN G POTASSIUM 5000000 UNITS IJ SOLR
5.0000 10*6.[IU] | Freq: Once | INTRAVENOUS | Status: AC
  Administered 2017-03-04: 5 10*6.[IU] via INTRAVENOUS
  Filled 2017-03-04: qty 5

## 2017-03-04 NOTE — Progress Notes (Signed)
[name] is a 36 y.o. [id number] at 4054w1d.  Subjective: Patient states allergy on record was angioedema to ampicillin.  States no reaction to penicillin from dentist a few months ago.  Objective: BP 114/73   Pulse (!) 117   Temp 98.6 F (37 C) (Oral)   Resp 20   Ht 5\' 3"  (1.6 m)   Wt 172 lb (78 kg)   LMP 07/08/2016   SpO2 98%   BMI 30.47 kg/m    FHT:  FHR: 140 bpm, variability: appropriate,  Accelerations: 10x10  ,  decelerations:  None FHR: 130 bpm, variability: appropriate,  Accelerations: 15x15  ,  decelerations:  None UC:   Q occasional lDilation: 6 Effacement (%): 70 Cervical Position: Posterior Station: -3 Presentation: Vertex(cord palpable through bag) Exam by:: Anyanwu  Labs: Results for orders placed or performed during the hospital encounter of 03/03/17 (from the past 24 hour(s))  Type and screen [facility]     Status: None   Collection Time: 03/03/17 12:22 PM  Result Value Ref Range   ABO/RH(D) A POS    Antibody Screen NEG    Sample Expiration 03/06/2017   CBC     Status: Abnormal   Collection Time: 03/03/17 12:22 PM  Result Value Ref Range   WBC 12.3 (H) 4.0 - 10.5 K/uL   RBC 3.57 (L) 3.87 - 5.11 MIL/uL   Hemoglobin 9.8 (L) 12.0 - 15.0 g/dL   HCT 91.430.2 (L) 78.236.0 - 95.646.0 %   MCV 84.6 78.0 - 100.0 fL   MCH 27.5 26.0 - 34.0 pg   MCHC 32.5 30.0 - 36.0 g/dL   RDW 21.317.7 (H) 08.611.5 - 57.815.5 %   Platelets 206 150 - 400 K/uL  RPR     Status: None   Collection Time: 03/03/17 12:22 PM  Result Value Ref Range   RPR Ser Ql Non Reactive Non Reactive  Urine rapid drug screen (hosp performed)[facility]     Status: None   Collection Time: 03/03/17 12:45 PM  Result Value Ref Range   Opiates NONE DETECTED NONE DETECTED   Cocaine NONE DETECTED NONE DETECTED   Benzodiazepines NONE DETECTED NONE DETECTED   Amphetamines NONE DETECTED NONE DETECTED   Tetrahydrocannabinol NONE DETECTED NONE DETECTED   Barbiturates NONE DETECTED NONE DETECTED   Magnesium     Status: Abnormal   Collection Time: 03/03/17  6:58 PM  Result Value Ref Range   Magnesium 4.4 (H) 1.7 - 2.4 mg/dL  Comprehensive metabolic panel     Status: Abnormal   Collection Time: 03/04/17  8:06 AM  Result Value Ref Range   Sodium 135 135 - 145 mmol/L   Potassium 4.2 3.5 - 5.1 mmol/L   Chloride 109 101 - 111 mmol/L   CO2 18 (L) 22 - 32 mmol/L   Glucose, Bld 99 65 - 99 mg/dL   BUN 6 6 - 20 mg/dL   Creatinine, Ser 4.690.50 0.44 - 1.00 mg/dL   Calcium 8.2 (L) 8.9 - 10.3 mg/dL   Total Protein 6.0 (L) 6.5 - 8.1 g/dL   Albumin 2.6 (L) 3.5 - 5.0 g/dL   AST 20 15 - 41 U/L   ALT 8 (L) 14 - 54 U/L   Alkaline Phosphatase 129 (H) 38 - 126 U/L   Total Bilirubin 1.2 0.3 - 1.2 mg/dL   GFR calc non Af Amer >60 >60 mL/min   GFR calc Af Amer >60 >60 mL/min   Anion gap 8 5 - 15  CBC     Status: Abnormal   Collection Time: 03/04/17  8:06 AM  Result Value Ref Range   WBC 12.0 (H) 4.0 - 10.5 K/uL   RBC 3.01 (L) 3.87 - 5.11 MIL/uL   Hemoglobin 8.3 (L) 12.0 - 15.0 g/dL   HCT 16.125.5 (L) 09.636.0 - 04.546.0 %   MCV 84.7 78.0 - 100.0 fL   MCH 27.6 26.0 - 34.0 pg   MCHC 32.5 30.0 - 36.0 g/dL   RDW 40.918.0 (H) 81.111.5 - 91.415.5 %   Platelets 181 150 - 400 K/uL    Assessment / Plan: 631w1d week IUP Labor: expectant management..DO NOT AROM Fetal Wellbeing:  Category 1 Pain Control:  Epidural, reevaluating per anesthesia due to hypotentsion Anticipated MOD:  TOLAC, concern for cord palpable at bag without head(s) engaging)  Marthenia RollingBland, Hailey Fede, DO 03/04/2017 9:25 AM

## 2017-03-04 NOTE — Progress Notes (Addendum)
Patient ID: [name], female   DOB: 16-Jan-1981, 36 y.o.   MRN: [id number]  Patient seen and examined. Had increased pelvic pain and had epidural replaced. Occasional contractions seen on monitor, although patient feeling contractions occasionally.   Dilation: 6.5 Effacement (%): 70 Cervical Position: Posterior Station: -3 Presentation: Undeterminable Exam by:: Randy Castrejon Cord palpated  Cat 1 tracing x2.  Discussed with MFM - no indication to deliver in absence of cervical change or decelerations.   Will reevaluate cervix in a couple of hours to evaluate for change.  Levie HeritageJacob J Ariellah Faust, DO 03/04/2017 1:15 PM

## 2017-03-04 NOTE — Progress Notes (Signed)
In patients room to adjust fetal monitor, monitor removed by patient. Refusing any further monitoring at this time, states the straps make her itch and she hasn't had any good rest since she had been here. Reviewed strip and encouraged mom to allow us to monitor for a little longer. Patient agreed to monitoring in the AM after she has gotten some rest. Request to be premedicated for itching prior to monitoring

## 2017-03-04 NOTE — Anesthesia Pain Management Evaluation Note (Signed)
  CRNA Pain Management Visit Note  Patient: [name], 36 y.o., female  "Hello I am a [facility] of the anesthesia [facility]. We have an anesthesia team available at all times to provide care throughout the hospital, including epidural management and anesthesia for C-section. I don't know your plan for the delivery whether it a natural birth, water birth, IV sedation, nitrous supplementation, doula or epidural, but we want to meet your pain goals."   1.Was your pain managed to your expectations on prior hospitalizations?   Yes   2.What is your expectation for pain management during this hospitalization?     Epidural  3.How can we help you reach that goal? epidural  Record the patient's initial score and the patient's pain goal.   Pain: 0  Pain Goal: 4 [facility] wants you to be able to say your pain was always managed very well.  Olen Eaves 03/04/2017

## 2017-03-04 NOTE — Progress Notes (Signed)
Presented to room due to patient complaint of not being able to sleep and itching from monitoring. Patient has had these complaints all shift. Complaining of no sleep. Have tried multiple anti-puritic and sleep medications without improvement.  Twins have been category 1 all shift thus far. Discussed with Dr. Macon LargeAnyanwu and she stated patient is basically an antenatal patient so can have intermittent monitoring. Next episode at shift change.   Patient agreeable to plan and will try and get some sleep. No change in cervical exam qshift.   Caryl AdaJazma Ashe Gago, DO OB Fellow

## 2017-03-04 NOTE — Anesthesia Postprocedure Evaluation (Signed)
Anesthesia Post Note  Patient: [name]  Procedure(s) Performed: AN AD HOC LABOR EPIDURAL     Patient location during evaluation: [facility] Anesthesia Type: Epidural Level of consciousness: awake and alert Pain management: pain level controlled Vital Signs Assessment: post-procedure vital signs reviewed and stable Respiratory status: spontaneous breathing, nonlabored ventilation and respiratory function stable Cardiovascular status: stable Postop Assessment: no headache, no backache and epidural receding Anesthetic complications: no    Last Vitals:  Vitals:   03/04/17 1607 03/04/17 1939  BP: (!) 127/58 129/68  Pulse: 96 (!) 117  Resp: 20 20  Temp: 37.1 C 36.7 C  SpO2:  99%    Last Pain:  Vitals:   03/04/17 1939  TempSrc: Oral  PainSc:    Pain Goal: Patients Stated Pain Goal: 10 (03/03/17 1110)               Marrion CoyMERRITT,Laurencia Roma

## 2017-03-04 NOTE — Progress Notes (Signed)
Patient ID: [name], female   DOB: [birth year], 36 y.o.   MRN: [id number]  Patient's epidural catheter broke. Patient having some pelvic pressure. Cervical exam unchanged: 5-6/70/-3. No cord presently palpated, but head not well applied. Difficult to determine whether contractions due to patient's frequent moving. Will transfer to antenatal. Will need to recheck, possibly with US to see if funic presentation reoccurs.   Levie HeritageJacob J Stinson, DO 03/04/2017 3:30 PM

## 2017-03-04 NOTE — Progress Notes (Signed)
CSW acknowledged consult and will meet with patient after L&D.  Antwione Picotte Boyd-Gilyard, MSW, LCSW Clinical Social Work [phone]  

## 2017-03-04 NOTE — Anesthesia Procedure Notes (Signed)
Epidural Patient location during procedure: OB Start time: 03/04/2017 10:49 AM End time: 03/04/2017 10:55 AM  Staffing Anesthesiologist: Beryle LatheBrock, Oaklie Durrett E, MD Performed: anesthesiologist   Preanesthetic Checklist Completed: patient identified, pre-op evaluation, timeout performed, IV checked, risks and benefits discussed and monitors and equipment checked  Epidural Patient position: sitting Prep: DuraPrep Patient monitoring: continuous pulse ox and blood pressure Approach: midline Location: L2-L3 Injection technique: LOR air  Needle:  Needle type: Tuohy  Needle gauge: 17 G Needle length: 9 cm Needle insertion depth: 7 cm Catheter size: 19 Gauge Catheter at skin depth: 13 cm Test dose: negative and Other (1% lidocaine)  Additional Notes Patient identified. Risks including, but not limited to, bleeding, infection, nerve damage, paralysis, inadequate analgesia, blood pressure changes, nausea, vomiting, allergic reaction, postpartum back pain, itching, and headache were discussed. Patient expressed understanding and wished to proceed. Previous epidural catheter removed prior to start of procedure. Tip intact. Sterile prep and drape, including hand hygiene, mask, and sterile gloves were used. The patient was positioned and the spine was prepped. The skin was anesthetized with lidocaine. No paraesthesia or other complication noted. The patient did not experience any signs of intravascular injection such as tinnitus or metallic taste in mouth, nor signs of intrathecal spread such as rapid motor block. Please see nursing notes for vital signs. The patient tolerated the procedure well.   Leslye [place] Dohmen, MDReason for block:procedure for pain

## 2017-03-05 DIAGNOSIS — Z3A34 34 weeks gestation of pregnancy: Secondary | ICD-10-CM

## 2017-03-05 LAB — BILE ACIDS, TOTAL: BILE ACIDS TOTAL: 8.5 umol/L (ref 4.7–24.5)

## 2017-03-05 LAB — CULTURE, BETA STREP (GROUP B ONLY)

## 2017-03-05 MED ORDER — CYCLOBENZAPRINE HCL 5 MG PO TABS
5.0000 mg | ORAL_TABLET | Freq: Three times a day (TID) | ORAL | Status: DC | PRN
  Administered 2017-03-05: 5 mg via ORAL
  Filled 2017-03-05 (×2): qty 1

## 2017-03-05 NOTE — Progress Notes (Signed)
Patient ID: [name], female   DOB: [birth year], 36 y.o.   MRN: [id number] FACULTY PRACTICE ANTEPARTUM(COMPREHENSIVE) NOTE  [name] is a 36 y.o. [id number] at 3715w2d who is admitted for Preterm labor.   Fetal presentation is cephalic. Length of Stay:  2  Days  Subjective: Backache and headache Patient reports the fetal movement as active. Patient reports uterine contraction  activity as none. Patient reports  vaginal bleeding as none. Patient describes fluid per vagina as None.  Vitals:  Blood pressure 119/67, pulse 90, temperature 98.4 F (36.9 C), temperature source Oral, resp. rate (!) 90, height 5\' 3"  (1.6 m), weight 78 kg (172 lb), last menstrual period 07/08/2016, SpO2 100 %, not currently breastfeeding. Physical Examination:  General appearance - alert, well appearing, and in no distress Heart - normal rate and regular rhythm Abdomen - soft, nontender, nondistended Fundal Height:  consistent with twins Cervical Exam: Not evaluated. Extremities: extremities normal, atraumatic, no cyanosis or edema and Homans sign is negative, no sign of DVT with DTRs 2+ bilaterally Membranes:intact  Fetal Monitoring:     Fetal Heart Rate A  Mode External filed at 03/05/2017 [id number]  Baseline Rate (A) 125 bpm filed at 03/05/2017 [id number]  Variability 6-25 BPM filed at 03/05/2017 [id number]  Accelerations 10 x 10 filed at 03/05/2017 [id number]  Decelerations Variable filed at 03/05/2017 0620  Multiple birth? --  [intermittent with excessive maternal movement] filed at 03/03/2017 1706  Fetal Heart Rate Fetus B  Mode External filed at 03/05/2017 0620  Baseline Rate (B) 125 BPM filed at 03/05/2017 [id number]  Variability 6-25 BPM filed at 03/05/2017 0620  Accelerations 10 x 10 filed at 03/05/2017 [id number]  Decelerations None filed at 03/05/2017 0620     Labs:  No results found for this or any previous visit (from the past 24 hour(s)).  Medications:  Scheduled . aspirin EC  81 mg Oral Daily  . docusate  sodium  100 mg Oral Daily  . ferrous sulfate  325 mg Oral Q breakfast  . pantoprazole  40 mg Oral Daily  . prenatal multivitamin  1 tablet Oral Q1200  . valACYclovir  500 mg Oral BID   I have reviewed the patient's current medications.  ASSESSMENT: Patient Active Problem List   Diagnosis Date Noted  . Preterm labor in third trimester 03/03/2017  . Preterm labor 03/03/2017  . Chronic hypertension in pregnancy 02/21/2017  . GERD (gastroesophageal reflux disease) 02/21/2017  . Umbilical hernia 01/13/2017  . Hereditary disease in family possibly affecting fetus, fetus 1   . Penicillin allergy 10/02/2016  . Opiate use 09/25/2016  . Supervision of high risk pregnancy, antepartum 09/19/2016  . History of herpes genitalis 09/19/2016  . History of trichomoniasis 09/19/2016  . Advanced maternal age in multigravida, second trimester 09/19/2016  . Grand multiparity 09/19/2016  . Short interval between pregnancies affecting pregnancy in first trimester, antepartum 09/19/2016  . Dichorionic diamniotic twin gestation 09/19/2016  . Chronic hypertension 07/03/2016  . Tobacco abuse 12/19/2014  . Bipolar disorder (HCC) 12/19/2014  . History of substance abuse 12/19/2014  . Sickle cell trait (HCC) 12/19/2014    PLAN: Monitor for s/sx of PTL, ROM  [name] 03/05/2017,10:51 AM

## 2017-03-06 ENCOUNTER — Encounter: Payer: Medicaid Other | Admitting: Family Medicine

## 2017-03-06 DIAGNOSIS — O30043 Twin pregnancy, dichorionic/diamniotic, third trimester: Secondary | ICD-10-CM

## 2017-03-06 MED ORDER — TRAMADOL HCL 50 MG PO TABS
50.0000 mg | ORAL_TABLET | Freq: Four times a day (QID) | ORAL | Status: DC | PRN
  Administered 2017-03-06 – 2017-03-15 (×23): 50 mg via ORAL
  Filled 2017-03-06 (×24): qty 1

## 2017-03-06 NOTE — Progress Notes (Signed)
Patient ID: [name], female   DOB: November 30, 1980, [age] y.o.   MRN: [id number] FACULTY PRACTICE ANTEPARTUM(COMPREHENSIVE) NOTE  [name] is a 36 y.o. [id number] at 2770w3d who is admitted for Preterm labor.   Fetal presentation is cephalic. Length of Stay:  3  Days  Subjective: Back pain at epidural site Patient reports the fetal movement as active. Patient reports uterine contraction  activity as none. Patient reports  vaginal bleeding as scant staining. Patient describes fluid per vagina as Other mucus.  Vitals:  Blood pressure 121/89, pulse (!) 101, temperature (!) 97.4 F (36.3 C), temperature source Oral, resp. rate 20, height 5\' 3"  (1.6 m), weight 78 kg (172 lb), last menstrual period 07/08/2016, SpO2 100 %, not currently breastfeeding. Physical Examination:  General appearance - alert, well appearing, and in no distress Heart - normal rate and regular rhythm Abdomen - soft, nontender, nondistended Fundal Height:  consistent with twins Cervical Exam: Not evaluated. . Extremities: extremities normal, atraumatic, no cyanosis or edema and Homans sign is negative, no sign of DVT Membranes:intact  Fetal Monitoring:     Fetal Heart Rate A  Mode External  [taken off monitor] filed at 03/05/2017 2146  Baseline Rate (A) 135 bpm filed at 03/05/2017 2146  Variability 6-25 BPM filed at 03/05/2017 2146  Accelerations 10 x 10, 15 x 15 filed at 03/05/2017 2146  Decelerations None filed at 03/05/2017 2146  Multiple birth? --  [intermittent with excessive maternal movement] filed at 03/03/2017 1706  Fetal Heart Rate Fetus B  Mode External filed at 03/05/2017 2146  Baseline Rate (B) 135 BPM filed at 03/05/2017 2146  Variability 6-25 BPM filed at 03/05/2017 2146  Accelerations 10 x 10, 15 x 15 filed at 03/05/2017 2146  Decelerations None filed at 03/05/2017 2146     Labs:  No results found for this or any previous visit (from the past 24 hour(s)).    Medications:  Scheduled .  aspirin EC  81 mg Oral Daily  . docusate sodium  100 mg Oral Daily  . ferrous sulfate  325 mg Oral Q breakfast  . pantoprazole  40 mg Oral Daily  . prenatal multivitamin  1 tablet Oral Q1200  . valACYclovir  500 mg Oral BID   I have reviewed the patient's current medications.  ASSESSMENT: Patient Active Problem List   Diagnosis Date Noted  . Preterm labor in third trimester 03/03/2017  . Preterm labor 03/03/2017  . Chronic hypertension in pregnancy 02/21/2017  . GERD (gastroesophageal reflux disease) 02/21/2017  . Umbilical hernia 01/13/2017  . Hereditary disease in family possibly affecting fetus, fetus 1   . Penicillin allergy 10/02/2016  . Opiate use 09/25/2016  . Supervision of high risk pregnancy, antepartum 09/19/2016  . History of herpes genitalis 09/19/2016  . History of trichomoniasis 09/19/2016  . Advanced maternal age in multigravida, second trimester 09/19/2016  . Grand multiparity 09/19/2016  . Short interval between pregnancies affecting pregnancy in first trimester, antepartum 09/19/2016  . Dichorionic diamniotic twin gestation 09/19/2016  . Chronic hypertension 07/03/2016  . Tobacco abuse 12/19/2014  . Bipolar disorder (HCC) 12/19/2014  . History of substance abuse 12/19/2014  . Sickle cell trait (HCC) 12/19/2014    PLAN: Tramadol for MS pain Observe for recurrent PTL  Scheryl DarterJames Arnold 03/06/2017,10:30 AM

## 2017-03-06 NOTE — ED Provider Notes (Signed)
Pt does not want Fetal monitoring at this time . Want it done at 3 a.m.

## 2017-03-06 NOTE — Plan of Care (Signed)
Will medicate as needed   and evaulate  for change if needed

## 2017-03-07 LAB — TYPE AND SCREEN
ABO/RH(D): A POS
Antibody Screen: NEGATIVE

## 2017-03-07 NOTE — Progress Notes (Signed)
Patient ID: Malachy Moodssey S Meinhart, female   DOB: Dec 30, 1980, 36 y.o.   MRN: [id number] Patient ID: Malachy Moodssey S Plog, female   DOB: Dec 30, 1980, 36 y.o.   MRN: [id number] FACULTY PRACTICE ANTEPARTUM(COMPREHENSIVE) NOTE  Malachy Moodssey S Well is a 36 y.o. [id number] at 634w3d who is admitted for Preterm labor.   Fetal presentation is cephalic. Length of Stay:  4  Days  Subjective: Back pain at epidural site Patient reports the fetal movement as active. Patient reports uterine contraction  activity as none. Patient reports  vaginal bleeding as scant staining. Patient describes fluid per vagina as Other mucus.  Blood pressure 135/85, pulse 90, temperature 98.7 F (37.1 C), temperature source Oral, resp. rate 18, height 5\' 3"  (1.6 m), weight 172 lb (78 kg), last menstrual period 07/08/2016, SpO2 100 %, not currently breastfeeding.  Physical Examination:  General appearance - alert, well appearing, and in no distress Heart - normal rate and regular rhythm Abdomen - soft, nontender, nondistended Fundal Height:  consistent with twins Cervical Exam: Not evaluated. . Extremities: extremities normal, atraumatic, no cyanosis or edema and Homans sign is negative, no sign of DVT Membranes:intact  Fetal Monitoring:     Fetal Heart Rate A  Mode External filed at 03/07/2017 0925  Baseline Rate (A) 135 bpm filed at 03/07/2017 0320  Variability 6-25 BPM filed at 03/07/2017 0320  Accelerations 15 x 15 filed at 03/07/2017 0320  Decelerations None filed at 03/07/2017 0320  Multiple birth? --  [intermittent with excessive maternal movement] filed at 03/03/2017 1706  Fetal Heart Rate Fetus B  Mode External filed at 03/07/2017 0925  Baseline Rate (B) 135 BPM filed at 03/07/2017 0320  Variability 6-25 BPM filed at 03/07/2017 0320  Accelerations 10 x 10, 15 x 15 filed at 03/07/2017 0320  Decelerations Variable  [ x 1] filed at 03/07/2017 0320       Labs:  No results found for this or any previous visit (from  the past 24 hour(s)).    Medications:  Scheduled . aspirin EC  81 mg Oral Daily  . docusate sodium  100 mg Oral Daily  . ferrous sulfate  325 mg Oral Q breakfast  . pantoprazole  40 mg Oral Daily  . prenatal multivitamin  1 tablet Oral Q1200  . valACYclovir  500 mg Oral BID   I have reviewed the patient's current medications.  ASSESSMENT:     Patient Active Problem List   Diagnosis Date Noted  . Preterm labor in third trimester 03/03/2017  . Preterm labor 03/03/2017  . Chronic hypertension in pregnancy 02/21/2017  . GERD (gastroesophageal reflux disease) 02/21/2017  . Umbilical hernia 01/13/2017  . Hereditary disease in family possibly affecting fetus, fetus 1   . Penicillin allergy 10/02/2016  . Opiate use 09/25/2016  . Supervision of high risk pregnancy, antepartum 09/19/2016  . History of herpes genitalis 09/19/2016  . History of trichomoniasis 09/19/2016  . Advanced maternal age in multigravida, second trimester 09/19/2016  . Grand multiparity 09/19/2016  . Short interval between pregnancies affecting pregnancy in first trimester, antepartum 09/19/2016  . Dichorionic diamniotic twin gestation 09/19/2016  . Chronic hypertension 07/03/2016  . Tobacco abuse 12/19/2014  . Bipolar disorder (HCC) 12/19/2014  . History of substance abuse 12/19/2014  . Sickle cell trait (HCC) 12/19/2014    PLAN: Tramadol for MS pain is working Observe for recurrent PTL Continue hospitalization  Adam PhenixArnold, James G, MD 03/07/2017 10:28 AM

## 2017-03-08 NOTE — Progress Notes (Addendum)
2230: Pt refused to have a tracing done on her babies. She stated that "it has being done 3-4 times today and I am not doing it again until tomorrow". MD informed and MD arrived at the bedside immediately.   2238: Pt agreed to have tracing done and we began EFM @2238 .  2303: Up to bathroom. We will try and do tem minutes more of EFM to ensure that we are tracing both babies.  2324: Pt returned to bed and refused to have another ten minutes EFM. She stated " I am not going back on that machine. My back is hurting me and I need an Ultram. " We respected patient's wishes but we also  explained the reason for same. We will try again in the morning.  0028: Hand held FHM in order to monitor babies for sixteen minutes (16mins)

## 2017-03-08 NOTE — Progress Notes (Signed)
Patient ID: [name], female   DOB: 20-Sep-1980, 36 y.o.   MRN: [id number] ACULTY PRACTICE ANTEPARTUM COMPREHENSIVE PROGRESS NOTE  [name] is a 36 y.o. [id number] at 3720w5d  who is admitted for Preterm labor.   Fetal presentation is cephalic/cephalic Length of Stay:  5  Days  Subjective: Pt is without complaints this morning. Reports + FM x 2. Denies HA or visual changes. No VB, LOF or ut ctx. Tolerating diet. Voiding without problems.    Vitals:  Blood pressure 124/60, pulse (!) 113, temperature 99.5 F (37.5 C), temperature source Oral, resp. rate 18, height 5\' 3"  (1.6 m), weight 78 kg (172 lb), last menstrual period 07/08/2016, SpO2 99 %, not currently breastfeeding.    Physical Examination: Lungs clear Heart RRR Abd soft + BS gravid non tender Ext non tender   Fetal Monitoring:  130-150's, + accels, reactive, good LTV x 2  Labs:  No results found for this or any previous visit (from the past 24 hour(s)).  Imaging Studies:    none   Medications:  Scheduled . aspirin EC  81 mg Oral Daily  . docusate sodium  100 mg Oral Daily  . ferrous sulfate  325 mg Oral Q breakfast  . pantoprazole  40 mg Oral Daily  . prenatal multivitamin  1 tablet Oral Q1200  . valACYclovir  500 mg Oral BID   I have reviewed the patient's current medications.  ASSESSMENT: Patient Active Problem List   Diagnosis Date Noted  . Preterm labor in third trimester 03/03/2017  . Preterm labor 03/03/2017  . Chronic hypertension in pregnancy 02/21/2017  . GERD (gastroesophageal reflux disease) 02/21/2017  . Umbilical hernia 01/13/2017  . Hereditary disease in family possibly affecting fetus, fetus 1   . Penicillin allergy 10/02/2016  . Opiate use 09/25/2016  . Supervision of high risk pregnancy, antepartum 09/19/2016  . History of herpes genitalis 09/19/2016  . History of trichomoniasis 09/19/2016  . Advanced maternal age in multigravida, second trimester 09/19/2016  . Grand multiparity  09/19/2016  . Short interval between pregnancies affecting pregnancy in first trimester, antepartum 09/19/2016  . Dichorionic diamniotic twin gestation 09/19/2016  . Chronic hypertension 07/03/2016  . Tobacco abuse 12/19/2014  . Bipolar disorder (HCC) 12/19/2014  . History of substance abuse 12/19/2014  . Sickle cell trait (HCC) 12/19/2014    PLAN: Stable. No evidence of labor.Continue in pt status until delivery BP stable without S/Sx of SIPEC Continue routine antenatal care.   Hermina StaggersMichael L Okema Rollinson 03/08/2017,7:45 AM

## 2017-03-08 NOTE — Progress Notes (Addendum)
Called to Room 319 by Patient. Patient states, "I have to come off of this monitor. I need to go to the bathroom, and this monitor is getting on my nerves. I'm uncomfortable with it on. I usually take it off myself." Explained to the patient that further monitoring is needed to verify fetal status. Patient verbalizes an understanding, but Patient continues to request that monitors be removed now. Monitors are removed per Patient's request.

## 2017-03-09 NOTE — Progress Notes (Signed)
In room to apply fetal monitors. Patient states, "I just woke up. I haven't eaten, and I have to use the bathroom. I want to eat before I get on the monitors." Reminded Patient of the agreed upon time of 2 p.m. Patient states, "I know, but I need to use the bathroom and eat, and I can't eat like I want to when I'm on those monitors. I'm uncomfortable." Further education given to the patient about the importance of fetal monitoring and the importance of needing to verify fetal status. Patient verbalizes an understanding, but she continues to state that she needs to eat before being placed on the monitor. Will return for fetal monitoring.

## 2017-03-09 NOTE — Progress Notes (Signed)
In room to apply fetal monitors. Patient states, "I don't want those things on right now. I had a long night and need to get some sleep." Educated Patient about the importance of fetal monitoring and encouraged Patient to be placed on the monitor for at least 20 minutes. Patient verbalizes an understanding of the education and states, "I really want to get some sleep right now and be comfortable while I sleep. Can I do it at 2?" Agreed to apply monitors at 2 p.m. for 30 minutes. Patient agrees and verbalizes an understanding.

## 2017-03-09 NOTE — Progress Notes (Signed)
FACULTY PRACTICE ANTEPARTUM PROGRESS NOTE  [name] is a 36 y.o. [id number] at 5225w6d who is admitted for advanced cervical dilation with di/di twins.  Estimated Date of Delivery: 04/14/17 Fetal presentation is vtx/vtx.  Length of Stay:  6 Days. Admitted 03/03/2017  Subjective:  Patient reports normal fetal movement.  She denies uterine contractions, denies  bleeding and leaking of fluid per vagina.  Vitals:  Blood pressure 113/70, pulse (!) 107, temperature 98.5 F (36.9 C), temperature source Oral, resp. rate 16, height 5\' 3"  (1.6 m), weight 172 lb (78 kg), last menstrual period 07/08/2016, SpO2 99 %, not currently breastfeeding. Physical Examination: CONSTITUTIONAL: Well-developed, well-nourished female in no acute distress.  HENT:  Normocephalic, atraumatic, External right and left ear normal. Oropharynx is clear and moist EYES: Conjunctivae and EOM are normal. Pupils are equal, round, and reactive to light. No scleral icterus.  NECK: Normal range of motion, supple, no masses. SKIN: Skin is warm and dry. No rash noted. Not diaphoretic. No erythema. No pallor. NEUROLGIC: Alert and oriented to person, place, and time. Normal reflexes, muscle tone coordination. No cranial nerve deficit noted. PSYCHIATRIC: Normal mood and affect. Normal behavior. Normal judgment and thought content. CARDIOVASCULAR: Normal heart rate noted, regular rhythm RESPIRATORY: Effort and breath sounds normal, no problems with respiration noted MUSCULOSKELETAL: Normal range of motion. No edema and no tenderness. ABDOMEN: Soft, nontender, nondistended, gravid. CERVIX: Dilation: 6 Effacement (%): 70 Cervical Position: Posterior Station: -3 Presentation: Vertex Exam by:: Stinson  Fetal monitoring: FHR: not on monitor yet this am, last pm, both twins reactive Uterine activity: irregular contractions per hour  No results found for this or any previous visit (from the past 48 hour(s)).  No results  found.  Current scheduled medications . aspirin EC  81 mg Oral Daily  . docusate sodium  100 mg Oral Daily  . ferrous sulfate  325 mg Oral Q breakfast  . pantoprazole  40 mg Oral Daily  . prenatal multivitamin  1 tablet Oral Q1200  . valACYclovir  500 mg Oral BID    I have reviewed the patient's current medications.  ASSESSMENT: Active Problems:   Preterm labor in third trimester   Preterm labor   PLAN: Stable. Continue in pt status until delivery BP stable without S/Sx of SIPEC Continue routine antenatal care.    Baldemar LenisK. Meryl Azalya Galyon, M.D. Attending Obstetrician & Gynecologist, [facility], [facility]

## 2017-03-09 NOTE — Progress Notes (Signed)
Fetal monitors applied. After being on monitor for seven minutes, Patient states, "I know that you just put these monitors on, but I have to use the bathroom." Monitors removed to allow Patient to use the bathroom.

## 2017-03-09 NOTE — Plan of Care (Signed)
Patient familiar with the labor and delivery processes. Denies leakage of fluid and bleeding. Patient is having contractions on the monitor, but denies pain with these contractions. Reminded to please notify staff if her contractions get stronger or close together, if the fetuses become less active of if she has any vaginal bleeding. She agrees.

## 2017-03-10 DIAGNOSIS — Z3A35 35 weeks gestation of pregnancy: Secondary | ICD-10-CM

## 2017-03-10 NOTE — Progress Notes (Signed)
Patient declines to be placed on EFM at this time.  Stated "I haven't slept much and I want to sleep." RN emphasized to patient importance of monitoring for fetal well-being and asked patient to call when she was done napping.

## 2017-03-10 NOTE — Progress Notes (Signed)
Initial Nutrition Assessment  DOCUMENTATION CODES:   Not applicable  INTERVENTION:  Regular diet May order double protein portions, snacks TID and from retail  NUTRITION DIAGNOSIS:   Increased nutrient needs related to (S) other (see comment)(pregnancy and fetal growth requirements) as evidenced by (S) other (comment)(35 week twin preg).  GOAL:   Patient will meet greater than or equal to 90% of their needs, Weight gain   MONITOR:  Weight trends  REASON FOR ASSESSMENT:  Antenatal    ASSESSMENT:   35 weeks, twins, PTL. Pre-preg weight 151 lbs, BMI 26.8. 21 lb weight gain. Pt asleep at time of visit. Left handouts for snacks TID and retail menu   Diet Order:  Diet regular Room service appropriate? Yes; Fluid consistency: Thin  EDUCATION NEEDS:   No education needs have been identified at this time  Skin:  Skin Assessment: Reviewed RN Assessment   Height:   Ht Readings from Last 1 Encounters:  03/03/17 5\' 3"  (1.6 m)    Weight:   Wt Readings from Last 1 Encounters:  03/03/17 172 lb (78 kg)    Ideal Body Weight:     BMI:  Body mass index is 30.47 kg/m.  Estimated Nutritional Needs:   Kcal:  2100-2300  Protein:  95-105 g  Fluid:  2.4 L    [name] M.Odis LusterEd. R.D. LDN Neonatal Nutrition Support Specialist/RD III Pager [phone]      Phone [phone]

## 2017-03-10 NOTE — Progress Notes (Signed)
Patient still adamantly refuses to allow me to place EFM.

## 2017-03-10 NOTE — Progress Notes (Signed)
When I had difficulty obtaining tracing for twin B, patient told me that she would not allow me "to keep moving that thing all over my belly because I didn't understand how uncomfortable it was." I offered to have another nurse come in to attempt to obtain tracing of twin B along with requested PRN meds for itching and pain. However when second RN arrived, patient refused to allow her to place ultrasound transducers and demanded that monitors be removed. Patient was not persuaded by my explanation of the importance of EFM to monitor fetal well-being. Transducers were removed and patient was asked to call out when meds had kicked in and she felt more comfortable and would allow us to place toco and ultrasound transducers.

## 2017-03-10 NOTE — Progress Notes (Signed)
Patient rolled to her right side and EFM tracing shows maternal and both fetal heart rates with baseline rate of 105. When I entered the room, she insisted that I take the monitor belts off for her to go to the bathroom. She refused to allow me to adjust the monitors to recheck the fetal heart rates. Explained again the reason why we must monitor her and her babies closely.   Monitoring ended.

## 2017-03-10 NOTE — Plan of Care (Signed)
  Health Behavior/Discharge Planning: Ability to manage health-related needs will improve 03/10/2017 2357 - Progressing by Darcus PesterHorton, Donnald Tabar, RN Note Patient uncooperative at times with scheduled monitoring of fetuses. Reminded of rationale for treatments.     Education: Knowledge of General Education information will improve 03/10/2017 2356 - Progressing by Darcus PesterHorton, Jordis Repetto, RN   Clinical Measurements: Will remain free from infection 03/10/2017 2356 - Progressing by Darcus PesterHorton, Kady Toothaker, RN   Coping: Level of anxiety will decrease 03/10/2017 2356 - Progressing by Darcus PesterHorton, Lesette Frary, RN   Pain Managment: General experience of comfort will improve 03/10/2017 2356 - Progressing by Darcus PesterHorton, Tajae Rybicki, RN

## 2017-03-10 NOTE — Progress Notes (Signed)
Patient ID: [name], female   DOB: 04-16-1980, 36 y.o.   MRN: [id number]  FACULTY PRACTICE ANTEPARTUM(COMPREHENSIVE) NOTE  [name] is a 36 y.o. [id number] at 8033w0d by best clinical estimate who is admitted for Preterm labor.   Fetal presentation is cephalic and cephalic. Length of Stay:  7  Days  Subjective: Denies symptoms of PTL. Feels tired today. Not sleeping well with twin gestation. Ambien is not working for her sleep. Patient reports the fetal movement as active. Patient reports uterine contraction  activity as none. Patient reports  vaginal bleeding as none. Patient describes fluid per vagina as None.  Vitals:  Blood pressure (!) 115/93, pulse (!) 117, temperature 98.7 F (37.1 C), temperature source Oral, resp. rate 18, height 5\' 3"  (1.6 m), weight 172 lb (78 kg), last menstrual period 07/08/2016, SpO2 96 %, not currently breastfeeding. Physical Examination:  General appearance - alert, well appearing, and in no distress Chest - normal effort Abdomen - gravid, Non-tender Fundal Height:  size greater than dates Extremities: Homans sign is negative, no sign of DVT  Membranes:intact  Fetal Monitoring: A Baseline: 135 bpm, Variability: Good {> 6 bpm), Accelerations: Reactive and Decelerations: Absent  B Baseline: 140 bpm, Variability: Good {> 6 bpm), Accelerations: Reactive and Decelerations: Absent   Medications:  Scheduled . aspirin EC  81 mg Oral Daily  . docusate sodium  100 mg Oral Daily  . ferrous sulfate  325 mg Oral Q breakfast  . pantoprazole  40 mg Oral Daily  . prenatal multivitamin  1 tablet Oral Q1200  . valACYclovir  500 mg Oral BID   I have reviewed the patient's current medications.  ASSESSMENT: Principal Problem:   Preterm labor Active Problems:   History of herpes genitalis   Grand multiparity   Dichorionic diamniotic twin gestation   Chronic hypertension in pregnancy   Preterm labor in third trimester   Funic presentation, fetus 1   Fatigue   PLAN: Continue Valtrex In-hospital monitoring for progression of PTL. Has u/s for growth of twins this week. Continue ASA BP is well controlled on no meds.  Reva Boresanya S Tawan Degroote, MD 03/10/2017,11:39 AM

## 2017-03-11 ENCOUNTER — Inpatient Hospital Stay (HOSPITAL_COMMUNITY)
Admission: RE | Admit: 2017-03-11 | Discharge: 2017-03-11 | Disposition: A | Discharge status: Home / Self Care | Payer: Medicaid Other | Source: Ambulatory Visit | Attending: Obstetrics & Gynecology | Admitting: Obstetrics & Gynecology

## 2017-03-11 DIAGNOSIS — O09523 Supervision of elderly multigravida, third trimester: Secondary | ICD-10-CM

## 2017-03-11 DIAGNOSIS — O99353 Diseases of the nervous system complicating pregnancy, third trimester: Secondary | ICD-10-CM

## 2017-03-11 DIAGNOSIS — Z3A35 35 weeks gestation of pregnancy: Secondary | ICD-10-CM

## 2017-03-11 DIAGNOSIS — O10013 Pre-existing essential hypertension complicating pregnancy, third trimester: Secondary | ICD-10-CM

## 2017-03-11 DIAGNOSIS — G40909 Epilepsy, unspecified, not intractable, without status epilepticus: Secondary | ICD-10-CM

## 2017-03-11 DIAGNOSIS — O30049 Twin pregnancy, dichorionic/diamniotic, unspecified trimester: Secondary | ICD-10-CM

## 2017-03-11 NOTE — Progress Notes (Signed)
Patient refuses to be put on the EFM at this time. She stated, "I just want to sleep. Nobody has let me rest since I've been here." Educated patient on the importance of monitoring the fetuses. Asked patient to call out when she was ready to be placed on the EFM.

## 2017-03-11 NOTE — Progress Notes (Signed)
Pt has refused to take her scheduled 10 am meds at this time. Pt states "I would rather take them later. I just want to sleep right now."

## 2017-03-11 NOTE — Progress Notes (Signed)
Notified MD of pt's refusal to be put on EFM. No new orders. Told to document and try again later.

## 2017-03-11 NOTE — Progress Notes (Signed)
Patient refused to be monitored, stated "I will let you know when I need to be monitored.  I had an ultrasound today and they said my babies were fine".

## 2017-03-11 NOTE — Progress Notes (Signed)
Patient ID: [name], female   DOB: 01-03-1981, 36 y.o.   MRN: [id number] FACULTY PRACTICE ANTEPARTUM(COMPREHENSIVE) NOTE  Hailey Moodssey S Spates is a 36 y.o. [id number] at 8566w1d by best clinical estimate who is admitted for Preterm labor.   Fetal presentation is cephalic and cephalic. Length of Stay:  8  Days  Subjective: Denies signs or symptoms of progressing PTL Patient reports the fetal movement as active. Patient reports uterine contraction  activity as none. Patient reports  vaginal bleeding as none. Patient describes fluid per vagina as None.  Vitals:  Blood pressure 136/84, pulse 95, temperature 98.4 F (36.9 C), temperature source Oral, resp. rate 20, height 5\' 3"  (1.6 m), weight 172 lb (78 kg), last menstrual period 07/08/2016, SpO2 99 %, not currently breastfeeding. Physical Examination:  General appearance - alert, well appearing, and in no distress Chest - normal effort Abdomen - gravid, NT Fundal Height:  size equals dates Cervical Exam: Dilation: 4.5 Effacement (%): 50 Cervical Position: Posterior Station: Ballotable Presentation: Vertex Exam by:: Shawnie PonsPratt NO cord felt today Extremities: Homans sign is negative, no sign of DVT  Membranes:intact  Fetal Monitoring: A  Baseline: 145 bpm, Variability: Good {> 6 bpm), Accelerations: Reactive and Decelerations: Absent B Baseline: 140 bpm, Variability: Good {> 6 bpm), Accelerations: Reactive and Decelerations: Absent   Medications:  Scheduled . aspirin EC  81 mg Oral Daily  . docusate sodium  100 mg Oral Daily  . ferrous sulfate  325 mg Oral Q breakfast  . pantoprazole  40 mg Oral Daily  . prenatal multivitamin  1 tablet Oral Q1200  . valACYclovir  500 mg Oral BID   I have reviewed the patient's current medications.  ASSESSMENT: Principal Problem:   Preterm labor Active Problems:   History of herpes genitalis   Grand multiparity   Dichorionic diamniotic twin gestation   Chronic hypertension in pregnancy  Preterm labor in third trimester   Funic presentation, fetus 1   PLAN: In u/s today to assess growth Increase activity-- Consider discharge tomorrow if no progression of PTL.  Reva Boresanya S Escarlet Saathoff, MD 03/11/2017,11:18 AM

## 2017-03-11 NOTE — Progress Notes (Signed)
Patient is still refusing to be placed on the EFM. She stated, "The ultrasound person said my babies looked fine, so I will refuse this time to be put on the monitor." Again, told patient to call when she was ready to be placed on monitor for an afternoon strip.

## 2017-03-12 LAB — TYPE AND SCREEN
ABO/RH(D): A POS
Antibody Screen: NEGATIVE

## 2017-03-12 NOTE — Progress Notes (Signed)
Patient ID: [name], female   DOB: 1980/08/21, 36 y.o.   MRN: [id number] FACULTY PRACTICE ANTEPARTUM(COMPREHENSIVE) NOTE  [name] is a 36 y.o. [id number] at 706w2d by best clinical estimate who is admitted for Preterm labor.   Fetal presentation is cephalic and cephalic. Length of Stay:  9  Days  Subjective: Reports back pain Patient reports the fetal movement as active. Patient reports uterine contraction  activity as none. Patient reports  vaginal bleeding as none. Patient describes fluid per vagina as None.  Vitals:  Blood pressure 136/82, pulse (!) 101, temperature 98.5 F (36.9 C), resp. rate 18, height 5\' 3"  (1.6 m), weight 172 lb (78 kg), last menstrual period 07/08/2016, SpO2 99 %, not currently breastfeeding. Physical Examination:  General appearance - alert, well appearing, and in no distress Chest - normal effort Abdomen - gravid, NT Fundal Height:  consistent with twins Extremities: Homans sign is negative, no sign of DVT  Membranes:intact  Fetal Monitoring: A Baseline: 145 bpm, Variability: Good {> 6 bpm), Accelerations: Reactive and Decelerations: Absent B Baseline: 150 bpm, Variability: Good {> 6 bpm), Accelerations: Reactive and Decelerations: Absent  Labs:  Results for orders placed or performed during the hospital encounter of 03/03/17 (from the past 24 hour(s))  [facility]   Collection Time: 03/12/17  8:30 AM  Result Value Ref Range   ABO/RH(D) A POS    Antibody Screen NEG    Sample Expiration 03/15/2017     Medications:  Scheduled . aspirin EC  81 mg Oral Daily  . docusate sodium  100 mg Oral Daily  . ferrous sulfate  325 mg Oral Q breakfast  . pantoprazole  40 mg Oral Daily  . prenatal multivitamin  1 tablet Oral Q1200  . valACYclovir  500 mg Oral BID   I have reviewed the patient's current medications.  ASSESSMENT: Principal Problem:   Preterm labor Active Problems:   History of herpes  genitalis   Grand multiparity   Dichorionic diamniotic twin gestation   Chronic hypertension in pregnancy   Preterm labor in third trimester   Funic presentation, fetus 1   PLAN: Continue inpatient management for advanced dilation. I have offered her discharge, and she is not comfortable with that. Babies are appropriately grown, vtx, vtx, 19% discordance on u/s yesterday. No cord presenting.  Reva Boresanya S Gustavo Dispenza, MD 03/12/2017,11:58 AM

## 2017-03-12 NOTE — Progress Notes (Signed)
Pt refusing EFM at this time. States she feels fine and does not have any concerns about the babies right now. Pt states the monitor is very uncomfortable for her and the babies were great during the last US. Agreeable to fetal monitoring in AM

## 2017-03-13 NOTE — Progress Notes (Signed)
Patient ID: [name], female   DOB: [birth year], 36 y.o.   MRN: [id number] FACULTY PRACTICE ANTEPARTUM(COMPREHENSIVE) NOTE  [name] is a 36 y.o. [id number] at 2569w3d by best clinical estimate who is admitted for Preterm labor.   Fetal presentation is cephalic and cephalic. Length of Stay:  10  Days  Subjective: Tired today Patient reports the fetal movement as active. Patient reports uterine contraction  activity as none. Patient reports  vaginal bleeding as none. Patient describes fluid per vagina as None.  Vitals:  Blood pressure 138/79, pulse (!) 103, temperature 99 F (37.2 C), temperature source Oral, resp. rate 18, height 5\' 3"  (1.6 m), weight 182 lb 12.8 oz (82.9 kg), last menstrual period 07/08/2016, SpO2 100 %, not currently breastfeeding. Physical Examination:  General appearance - alert, well appearing, and in no distress Chest - normal effort Abdomen - gravid, Non-tender Fundal Height:  size equals dates Extremities: Homans sign is negative, no sign of DVT  Membranes:intact  Fetal Monitoring: A Baseline: 145 bpm, Variability: Good {> 6 bpm), Accelerations: Reactive and Decelerations: Absent B Baseline: 140 bpm, Variability: Good {> 6 bpm), Accelerations: Reactive and Decelerations: Absent   Medications:  Scheduled . aspirin EC  81 mg Oral Daily  . docusate sodium  100 mg Oral Daily  . ferrous sulfate  325 mg Oral Q breakfast  . pantoprazole  40 mg Oral Daily  . prenatal multivitamin  1 tablet Oral Q1200  . valACYclovir  500 mg Oral BID   I have reviewed the patient's current medications.  ASSESSMENT: Principal Problem:   Preterm labor Active Problems:   History of herpes genitalis   Grand multiparity   Dichorionic diamniotic twin gestation   Chronic hypertension in pregnancy   Preterm labor in third trimester   Funic presentation, fetus 1  PLAN: Continue inpatient monitoring for s/sx's of labor  Reva Boresanya S Lashann Hagg, MD 03/13/2017,1:40 PM

## 2017-03-14 LAB — COMPREHENSIVE METABOLIC PANEL
ALBUMIN: 2.5 g/dL — AB (ref 3.5–5.0)
ALK PHOS: 152 U/L — AB (ref 38–126)
ALT: 14 U/L (ref 14–54)
AST: 21 U/L (ref 15–41)
Anion gap: 9 (ref 5–15)
BILIRUBIN TOTAL: 0.7 mg/dL (ref 0.3–1.2)
BUN: 8 mg/dL (ref 6–20)
CALCIUM: 8.8 mg/dL — AB (ref 8.9–10.3)
CO2: 19 mmol/L — AB (ref 22–32)
Chloride: 105 mmol/L (ref 101–111)
Creatinine, Ser: 0.49 mg/dL (ref 0.44–1.00)
GFR calc Af Amer: >60 mL/min (ref >60)
GFR calc non Af Amer: >60 mL/min (ref >60)
GLUCOSE: 105 mg/dL — AB (ref 65–99)
Potassium: 3.8 mmol/L (ref 3.5–5.1)
SODIUM: 133 mmol/L — AB (ref 135–145)
TOTAL PROTEIN: 5.8 g/dL — AB (ref 6.5–8.1)

## 2017-03-14 LAB — CBC
HEMATOCRIT: 30 % — AB (ref 36.0–46.0)
HEMOGLOBIN: 9.5 g/dL — AB (ref 12.0–15.0)
MCH: 28 pg (ref 26.0–34.0)
MCHC: 31.7 g/dL (ref 30.0–36.0)
MCV: 88.5 fL (ref 78.0–100.0)
Platelets: 188 10*3/uL (ref 150–400)
RBC: 3.39 MIL/uL — AB (ref 3.87–5.11)
RDW: 21.4 % — ABNORMAL HIGH (ref 11.5–15.5)
WBC: 8.5 10*3/uL (ref 4.0–10.5)

## 2017-03-14 LAB — PROTEIN / CREATININE RATIO, URINE
Creatinine, Urine: 54 mg/dL
PROTEIN CREATININE RATIO: 0.22 mg/mg{creat} — AB (ref 0.00–0.15)
Total Protein, Urine: 12 mg/dL

## 2017-03-14 NOTE — Progress Notes (Signed)
Patient ID: [name], female   DOB: Oct 25, 1980, 36 y.o.   MRN: [id number] FACULTY PRACTICE ANTEPARTUM(COMPREHENSIVE) NOTE  [name] is a 36 y.o. [id number] at 3361w4d by best clinical estimate who is admitted for Preterm labor.   Fetal presentation is cephalic and cephalic. Length of Stay:  11  Days  Subjective: Reports low back pain Patient reports the fetal movement as active. Patient reports uterine contraction  activity as none. Patient reports  vaginal bleeding as none. Patient describes fluid per vagina as None.  Vitals:  Blood pressure (!) 110/45, pulse (!) 108, temperature 97.8 F (36.6 C), temperature source Oral, resp. rate 18, height 5\' 3"  (1.6 m), weight 181 lb 4 oz (82.2 kg), last menstrual period 07/08/2016, SpO2 100 %, not currently breastfeeding. Physical Examination:  General appearance - alert, well appearing, and in no distress Chest - normal effort Abdomen - gravid, NT Fundal Height:  consistent with twins Extremities: Homans sign is negative, no sign of DVT  Membranes:intact  Fetal Monitoring: A Baseline: 140 bpm, Variability: Good {> 6 bpm), Accelerations: Reactive and Decelerations: Absent B Baseline: 135 bpm, Variability: Good {> 6 bpm), Accelerations: Reactive and Decelerations: Absent Labs:  Results for orders placed or performed during the hospital encounter of 03/03/17 (from the past 24 hour(s))  CBC   Collection Time: 03/14/17 10:56 AM  Result Value Ref Range   WBC 8.5 4.0 - 10.5 K/uL   RBC 3.39 (L) 3.87 - 5.11 MIL/uL   Hemoglobin 9.5 (L) 12.0 - 15.0 g/dL   HCT 27.230.0 (L) 53.636.0 - 64.446.0 %   MCV 88.5 78.0 - 100.0 fL   MCH 28.0 26.0 - 34.0 pg   MCHC 31.7 30.0 - 36.0 g/dL   RDW 03.421.4 (H) 74.211.5 - 59.515.5 %   Platelets 188 150 - 400 K/uL  Comprehensive metabolic panel   Collection Time: 03/14/17 10:56 AM  Result Value Ref Range   Sodium 133 (L) 135 - 145 mmol/L   Potassium 3.8 3.5 - 5.1 mmol/L   Chloride 105 101 - 111 mmol/L   CO2 19 (L) 22 - 32  mmol/L   Glucose, Bld 105 (H) 65 - 99 mg/dL   BUN 8 6 - 20 mg/dL   Creatinine, Ser 6.380.49 0.44 - 1.00 mg/dL   Calcium 8.8 (L) 8.9 - 10.3 mg/dL   Total Protein 5.8 (L) 6.5 - 8.1 g/dL   Albumin 2.5 (L) 3.5 - 5.0 g/dL   AST 21 15 - 41 U/L   ALT 14 14 - 54 U/L   Alkaline Phosphatase 152 (H) 38 - 126 U/L   Total Bilirubin 0.7 0.3 - 1.2 mg/dL   GFR calc non Af Amer >60 >60 mL/min   GFR calc Af Amer >60 >60 mL/min   Anion gap 9 5 - 15  Protein / creatinine ratio, urine   Collection Time: 03/14/17 11:00 AM  Result Value Ref Range   Creatinine, Urine 54.00 mg/dL   Total Protein, Urine 12 mg/dL   Protein Creatinine Ratio 0.22 (H) 0.00 - 0.15 mg/mg[Cre]    Medications:  Scheduled . aspirin EC  81 mg Oral Daily  . docusate sodium  100 mg Oral Daily  . ferrous sulfate  325 mg Oral Q breakfast  . pantoprazole  40 mg Oral Daily  . prenatal multivitamin  1 tablet Oral Q1200  . valACYclovir  500 mg Oral BID   I have reviewed the patient's current medications.  ASSESSMENT: Principal Problem:   Preterm labor Active Problems:  History of herpes genitalis   Grand multiparity   Dichorionic diamniotic twin gestation   Chronic hypertension in pregnancy   Preterm labor in third trimester   Funic presentation, fetus 1  BP is up--similar to 17 wk BPs--nml labs will follow for now.  PLAN: Continue inpatient monitoring Watch BP's Continue Valtrex.  Reva Boresanya S Pratt, MD 03/14/2017,12:51 PM

## 2017-03-14 NOTE — Progress Notes (Signed)
Patient refused EFM during night.

## 2017-03-14 NOTE — Progress Notes (Addendum)
Pt calling out with c/o SOB, VSS, o2 100% RA, encouraged patient to elevate HOB and change position to improve lung expansion. Pt continues to refuse fetal monitoring. Pt refusing position change stating that she is comfortable on her left lateral position. Lungs assessed and clear x 4 quadrants. Dr. Debroah LoopArnold made aware and will continue to assess patient status.

## 2017-03-15 LAB — TYPE AND SCREEN
ABO/RH(D): A POS
Antibody Screen: NEGATIVE

## 2017-03-15 NOTE — Progress Notes (Signed)
Again, I reminded patient of the need for EFM. She declined to allow me to place her on EFM at this time. I instructed her to call me when she is ready to be placed on EFM.

## 2017-03-15 NOTE — Progress Notes (Signed)
Patient declined fetal monitoring during the night. Offered again in the early morning(5am) and patient also refused

## 2017-03-15 NOTE — Progress Notes (Signed)
When I asked patient if I could put her on EFM to monitor for contractions and fetal well-being, she stated that she "wanted to eat breakfast and take a nap." I instructed patient to call me when she awakened from nap and would allow me to place her on EFM; she stated she would.

## 2017-03-15 NOTE — Progress Notes (Signed)
Patient ID: [name], female   DOB: [birth year], 36 y.o.   MRN: [id number] FACULTY PRACTICE ANTEPARTUM(COMPREHENSIVE) NOTE  [name] is a 36 y.o. [id number] at 6143w5d who is admitted for Preterm labor.   Fetal presentation is cephalic and /cephalic. Length of Stay:  12  Days  Subjective: Difficulty ambulating Patient reports the fetal movement as active. Patient reports uterine contraction  activity as none. Patient reports  vaginal bleeding as none. Patient describes fluid per vagina as None.  Vitals:  Blood pressure 127/73, pulse (!) 109, temperature 99.1 F (37.3 C), temperature source Oral, resp. rate 20, height 5\' 3"  (1.6 m), weight 179 lb (81.2 kg), last menstrual period 07/08/2016, SpO2 99 %, not currently breastfeeding. Physical Examination:  General appearance - alert, well appearing, and in no distress Heart - normal rate and regular rhythm Abdomen - soft, nontender, nondistended Fundal Height:  consistent with twins Cervical Exam: Not evaluated. Extremities: extremities normal, atraumatic, no cyanosis or edema and Homans sign is negative, no sign of DVT  Membranes:intact  Fetal Monitoring:     Fetal Heart Rate A  Mode External  [declines monitoring at this time] filed at 03/14/2017 2213  Baseline Rate (A) 140 bpm filed at 03/14/2017 0630  Variability 6-25 BPM filed at 03/14/2017 0630  Accelerations 10 x 10, 15 x 15 filed at 03/14/2017 0630  Decelerations None filed at 03/14/2017 0630  Multiple birth? Y filed at 03/10/2017 2110  Fetal Heart Rate Fetus B  Mode External  [pt refusing monitor at this time] filed at 03/14/2017 1530  Baseline Rate (B) 145 BPM filed at 03/14/2017 0630  Variability 6-25 BPM filed at 03/14/2017 0630  Accelerations 10 x 10, 15 x 15 filed at 03/14/2017 0630  Decelerations None filed at 03/14/2017 0630     Labs:  Results for orders placed or performed during the hospital encounter of 03/03/17 (from the past 24 hour(s))  CBC   Collection Time: 03/14/17 10:56 AM  Result Value Ref Range   WBC 8.5 4.0 - 10.5 K/uL   RBC 3.39 (L) 3.87 - 5.11 MIL/uL   Hemoglobin 9.5 (L) 12.0 - 15.0 g/dL   HCT 47.830.0 (L) 29.536.0 - 62.146.0 %   MCV 88.5 78.0 - 100.0 fL   MCH 28.0 26.0 - 34.0 pg   MCHC 31.7 30.0 - 36.0 g/dL   RDW 30.821.4 (H) 65.711.5 - 84.615.5 %   Platelets 188 150 - 400 K/uL  Comprehensive metabolic panel   Collection Time: 03/14/17 10:56 AM  Result Value Ref Range   Sodium 133 (L) 135 - 145 mmol/L   Potassium 3.8 3.5 - 5.1 mmol/L   Chloride 105 101 - 111 mmol/L   CO2 19 (L) 22 - 32 mmol/L   Glucose, Bld 105 (H) 65 - 99 mg/dL   BUN 8 6 - 20 mg/dL   Creatinine, Ser 9.620.49 0.44 - 1.00 mg/dL   Calcium 8.8 (L) 8.9 - 10.3 mg/dL   Total Protein 5.8 (L) 6.5 - 8.1 g/dL   Albumin 2.5 (L) 3.5 - 5.0 g/dL   AST 21 15 - 41 U/L   ALT 14 14 - 54 U/L   Alkaline Phosphatase 152 (H) 38 - 126 U/L   Total Bilirubin 0.7 0.3 - 1.2 mg/dL   GFR calc non Af Amer >60 >60 mL/min   GFR calc Af Amer >60 >60 mL/min   Anion gap 9 5 - 15  Protein / creatinine ratio, urine   Collection Time: 03/14/17 11:00 AM  Result  Value Ref Range   Creatinine, Urine 54.00 mg/dL   Total Protein, Urine 12 mg/dL   Protein Creatinine Ratio 0.22 (H) 0.00 - 0.15 mg/mg[Cre]     Medications:  Scheduled . aspirin EC  81 mg Oral Daily  . docusate sodium  100 mg Oral Daily  . ferrous sulfate  325 mg Oral Q breakfast  . pantoprazole  40 mg Oral Daily  . prenatal multivitamin  1 tablet Oral Q1200  . valACYclovir  500 mg Oral BID   I have reviewed the patient's current medications.  ASSESSMENT: Patient Active Problem List   Diagnosis Date Noted  . Funic presentation, fetus 1 03/10/2017  . Preterm labor in third trimester 03/03/2017  . Preterm labor 03/03/2017  . Chronic hypertension in pregnancy 02/21/2017  . GERD (gastroesophageal reflux disease) 02/21/2017  . Umbilical hernia 01/13/2017  . Hereditary disease in family possibly affecting fetus, fetus 1   . Penicillin  allergy 10/02/2016  . Opiate use 09/25/2016  . Supervision of high risk pregnancy, antepartum 09/19/2016  . History of herpes genitalis 09/19/2016  . History of trichomoniasis 09/19/2016  . Advanced maternal age in multigravida, second trimester 09/19/2016  . Grand multiparity 09/19/2016  . Short interval between pregnancies affecting pregnancy in first trimester, antepartum 09/19/2016  . Dichorionic diamniotic twin gestation 09/19/2016  . Chronic hypertension 07/03/2016  . Tobacco abuse 12/19/2014  . Bipolar disorder (HCC) 12/19/2014  . History of substance abuse 12/19/2014  . Sickle cell trait (HCC) 12/19/2014    PLAN Continue inpatient monitoring Watch BP's Continue Valtrex.    Scheryl DarterJames Hadja Harral 03/15/2017,7:03 AM

## 2017-03-16 DIAGNOSIS — Z3A35 35 weeks gestation of pregnancy: Secondary | ICD-10-CM

## 2017-03-16 MED ORDER — DOCUSATE SODIUM 100 MG PO CAPS: 100.0000 mg | ORAL_CAPSULE | Freq: Every day | ORAL | 0 refills | Status: DC

## 2017-03-16 NOTE — Discharge Instructions (Signed)
Hypertension During Pregnancy Hypertension is also called high blood pressure. High blood pressure means that the force of your blood moving in your body is too strong. When you are pregnant, this condition should be watched carefully. It can cause problems for you and your baby. Follow these instructions at home: Eating and drinking  Drink enough fluid to keep your pee (urine) clear or pale yellow.  Eat healthy foods that are low in salt (sodium). ? Do not add salt to your food. ? Check labels on foods and drinks to see much salt is in them. Look on the label where you see "Sodium." Lifestyle  Do not use any products that contain nicotine or tobacco, such as cigarettes and e-cigarettes. If you need help quitting, ask your doctor.  Do not use alcohol.  Avoid caffeine.  Avoid stress. Rest and get plenty of sleep. General instructions  Take over-the-counter and prescription medicines only as told by your doctor.  While lying down, lie on your left side. This keeps pressure off your baby.  While sitting or lying down, raise (elevate) your feet. Try putting some pillows under your lower legs.  Exercise regularly. Ask your doctor what kinds of exercise are best for you.  Keep all prenatal and follow-up visits as told by your doctor. This is important. Contact a doctor if:  You have symptoms that your doctor told you to watch for, such as: ? Fever. ? Throwing up (vomiting). ? Headache. Get help right away if:  You have very bad pain in your belly (abdomen).  You are throwing up, and this does not get better with treatment.  You suddenly get swelling in your hands, ankles, or face.  You gain 4 lb (1.8 kg) or more in 1 week.  You get bleeding from your vagina.  You have blood in your pee.  You do not feel your baby moving as much as normal.  You have a change in vision.  You have muscle twitching or sudden tightening (spasms).  You have trouble breathing.  Your lips  or fingernails turn blue. This information is not intended to replace advice given to you by your health care provider. Make sure you discuss any questions you have with your health care provider. Document Released: 04/27/2010 Document Revised: 12/05/2015 Document Reviewed: 12/05/2015 Elsevier Interactive Patient Education  2017 Elsevier Inc.  

## 2017-03-16 NOTE — Progress Notes (Signed)
I asked [name] if she would like me to place her on EFM or obtain FHR via Doppler prior to discharge. She declined, stating " I feel them moving. I'm sure they are fine."

## 2017-03-16 NOTE — Progress Notes (Signed)
Patient had left unit with cart " to put my stuff in the car." I found patient in front lobby area and gave her discharge instructions. Patient verbalized understanding of all instructions given. Written copy of AVS given to patient. Discharged home in stable condition with family member.

## 2017-03-17 NOTE — Discharge Summary (Signed)
Antenatal Physician Discharge Summary  Patient ID: [name] MRN: [id number] DOB/AGE: 1980/10/12 36 y.o.  Admit date: 03/03/2017 Discharge date: 03/17/2017  Admission Diagnoses: Twin pregnancy 34 weeks,  Chronic hypertension, preterm labor.  Discharge Diagnoses: Pregnancy 35w6 d not delivered                                       Chronic hypertension                                        Preterm labor resolved.  Prenatal Procedures: Epidural for labor management discontinued.  Consults: Neonatology, Maternal Fetal Medicine  Hospital Course:  This is a 36 y.o. [id number] with IUP at 4550w0d admitted for abdominal discomfort felt to be preterm labor, with twin pregnancy , vertex/ vertex, and cervical dilation to 3.5 cm. She was admitted with contractions, noted to have a cervical exam of 3.5 cm.  No leaking of fluid and no bleeding.  She was hyperactive,consistent with her bipolar dx. She received an epidural shortly after admission . Cervical exam showed cervix progression recorded as 6/70/-3, with a question of funic(cord ) presentation,which was subsequently apparently resolved by exam of 12/4. She was given Betamethasone and Magnesium sulfate contractions stopped. She was transferred to antenatal and cervix noted to have regressed to 4.5 cm /50% posterior on 03/11/17. U/S showed 19% growth discordance between fetuses, vertex/vertex, and no funic presentation. She was offered home care and continued antenatal testing. She initially refused home care, She was interested in home d/c care at 36 wk(12/10) if no labor, after discussions over weekend. She then insisted on discharge Home on 03/16/17, after family issues at home developed, and was d/c'd on  12/09, [facility] for further fetal monitoring and decision on timing of delivery, 37 vs 38 wk if no labor develops spontaneously    She was seen by Neonatology during her stay.  She was observed, fetal heart rate  monitoring remained reassuring, and she had no signs/symptoms of progressing preterm labor or other maternal-fetal concerns.  Her cervical exam was 4.5 cm at last exam. She was unwilling to be rechecked before discharge.Marland Kitchen.  She was deemed stable for discharge to home with outpatient follow up.  Discharge Exam:   Physical Examination: CONSTITUTIONAL: Well-developed, well-nourished female in no acute distress.  HENT:  Normocephalic, atraumatic, External right and left ear normal. Oropharynx is clear and moist EYES: Conjunctivae and EOM are normal. Pupils are equal, round, and reactive to light. No scleral icterus.  NECK: Normal range of motion, supple, no masses SKIN: Skin is warm and dry. No rash noted. Not diaphoretic. No erythema. No pallor. NEUROLGIC: Alert and oriented to person, place, and time. Normal reflexes, muscle tone coordination. No cranial nerve deficit noted. PSYCHIATRIC: Normal mood and affect. Normal behavior. Normal judgment and thought content. CARDIOVASCULAR: Normal heart rate noted, regular rhythm RESPIRATORY: Effort and breath sounds normal, no problems with respiration noted MUSCULOSKELETAL: Normal range of motion. No edema and no tenderness. 2+ distal pulses. ABDOMEN: Soft, nontender, nondistended, gravid. CERVIX: Dilation: 4.5 Effacement (%): 50 Cervical Position: Posterior Station: Ballotable Presentation: Vertex Exam by:: Shawnie PonsPratt  Fetal monitoring: FHR: 140's reactive x 2, Variability: moderate, Accelerations: Present, Decelerations: Absent  Uterine activity: none contractions per hour  Significant Diagnostic Studies:  Results for orders  placed or performed during the hospital encounter of 03/03/17 (from the past 168 hour(s))  Type and screen [facility]   Collection Time: 03/12/17  8:30 AM  Result Value Ref Range   ABO/RH(D) A POS    Antibody Screen NEG    Sample Expiration 03/15/2017   CBC   Collection Time: 03/14/17 10:56 AM  Result  Value Ref Range   WBC 8.5 4.0 - 10.5 K/uL   RBC 3.39 (L) 3.87 - 5.11 MIL/uL   Hemoglobin 9.5 (L) 12.0 - 15.0 g/dL   HCT 16.130.0 (L) 09.636.0 - 04.546.0 %   MCV 88.5 78.0 - 100.0 fL   MCH 28.0 26.0 - 34.0 pg   MCHC 31.7 30.0 - 36.0 g/dL   RDW 40.921.4 (H) 81.111.5 - 91.415.5 %   Platelets 188 150 - 400 K/uL  Comprehensive metabolic panel   Collection Time: 03/14/17 10:56 AM  Result Value Ref Range   Sodium 133 (L) 135 - 145 mmol/L   Potassium 3.8 3.5 - 5.1 mmol/L   Chloride 105 101 - 111 mmol/L   CO2 19 (L) 22 - 32 mmol/L   Glucose, Bld 105 (H) 65 - 99 mg/dL   BUN 8 6 - 20 mg/dL   Creatinine, Ser 7.820.49 0.44 - 1.00 mg/dL   Calcium 8.8 (L) 8.9 - 10.3 mg/dL   Total Protein 5.8 (L) 6.5 - 8.1 g/dL   Albumin 2.5 (L) 3.5 - 5.0 g/dL   AST 21 15 - 41 U/L   ALT 14 14 - 54 U/L   Alkaline Phosphatase 152 (H) 38 - 126 U/L   Total Bilirubin 0.7 0.3 - 1.2 mg/dL   GFR calc non Af Amer >60 >60 mL/min   GFR calc Af Amer >60 >60 mL/min   Anion gap 9 5 - 15  Protein / creatinine ratio, urine   Collection Time: 03/14/17 11:00 AM  Result Value Ref Range   Creatinine, Urine 54.00 mg/dL   Total Protein, Urine 12 mg/dL   Protein Creatinine Ratio 0.22 (H) 0.00 - 0.15 mg/mg[Cre]  Type and screen [facility]   Collection Time: 03/15/17  6:54 AM  Result Value Ref Range   ABO/RH(D) A POS    Antibody Screen NEG    Sample Expiration 03/18/2017     Discharge Condition: Stable  Disposition: 01-Home or Self Care   Discharge Instructions    Diet - low sodium heart healthy   Complete by:  As directed    Increase activity slowly   Complete by:  As directed      Allergies as of 03/16/2017      Reactions   Peanut-containing Drug Products Anaphylaxis, Itching, Rash   Amoxicillin Hives, Swelling, Other (See Comments)   Has patient had a PCN reaction causing immediate rash, facial/tongue/throat swelling, SOB or lightheadedness with hypotension: No Has patient had a PCN reaction causing severe rash  involving mucus membranes or skin necrosis: No Has patient had a PCN reaction that required hospitalization No Has patient had a PCN reaction occurring within the last 10 years: Yes If all of the above answers are "NO", then may proceed with Cephalosporin use.   Latex Hives, Itching      Medication List    STOP taking these medications   promethazine 25 MG tablet Commonly known as:  PHENERGAN     TAKE these medications   aspirin EC 81 MG tablet Take 1 tablet (81 mg total) by mouth daily. Take after 12 weeks for prevention  of preeclampsia later in pregnancy   docusate sodium 100 MG capsule Commonly known as:  COLACE Take 1 capsule (100 mg total) by mouth daily.   ferrous sulfate 325 (65 FE) MG tablet Take 1 tablet (325 mg total) by mouth daily.   omeprazole 20 MG tablet Commonly known as:  PRILOSEC OTC Take 1 tablet (20 mg total) daily by mouth.   PREPLUS 27-1 MG Tabs Take 1 tablet by mouth daily.   valACYclovir 1000 MG tablet Commonly known as:  VALTREX Take 0.5 tablets (500 mg total) 2 (two) times daily by mouth.      Follow-up Information    [facility]. Schedule an appointment as soon as possible for a visit.   Specialty:  Obstetrics and Gynecology Contact information: [address], [address] [place] [address] [phone]          Signed: Tilda Burrow M.D. 03/17/2017, 8:17 AM

## 2017-03-20 ENCOUNTER — Inpatient Hospital Stay (HOSPITAL_COMMUNITY)
Admission: AD | Admit: 2017-03-20 | Discharge: 2017-03-20 | Disposition: A | Discharge status: Home / Self Care | Payer: Medicaid Other | Source: Ambulatory Visit | Attending: Obstetrics and Gynecology | Admitting: Obstetrics and Gynecology

## 2017-03-20 ENCOUNTER — Inpatient Hospital Stay (HOSPITAL_COMMUNITY): Payer: Medicaid Other | Admitting: Anesthesiology

## 2017-03-20 ENCOUNTER — Telehealth (HOSPITAL_COMMUNITY): Payer: Self-pay | Admitting: *Deleted

## 2017-03-20 ENCOUNTER — Encounter (HOSPITAL_COMMUNITY): Payer: Self-pay

## 2017-03-20 ENCOUNTER — Inpatient Hospital Stay (HOSPITAL_COMMUNITY)
Admission: AD | Admit: 2017-03-20 | Discharge: 2017-03-24 | DRG: 786 | Disposition: A | Discharge status: Home / Self Care | Payer: Medicaid Other | Source: Ambulatory Visit | Attending: Obstetrics and Gynecology | Admitting: Obstetrics and Gynecology

## 2017-03-20 DIAGNOSIS — O99891 Other specified diseases and conditions complicating pregnancy: Secondary | ICD-10-CM

## 2017-03-20 DIAGNOSIS — O99824 Streptococcus B carrier state complicating childbirth: Secondary | ICD-10-CM | POA: Diagnosis present

## 2017-03-20 DIAGNOSIS — O479 False labor, unspecified: Secondary | ICD-10-CM

## 2017-03-20 DIAGNOSIS — O99324 Drug use complicating childbirth: Secondary | ICD-10-CM | POA: Diagnosis present

## 2017-03-20 DIAGNOSIS — O99214 Obesity complicating childbirth: Secondary | ICD-10-CM | POA: Diagnosis present

## 2017-03-20 DIAGNOSIS — O9902 Anemia complicating childbirth: Secondary | ICD-10-CM | POA: Diagnosis present

## 2017-03-20 DIAGNOSIS — Z3A38 38 weeks gestation of pregnancy: Secondary | ICD-10-CM | POA: Insufficient documentation

## 2017-03-20 DIAGNOSIS — O30003 Twin pregnancy, unspecified number of placenta and unspecified number of amniotic sacs, third trimester: Secondary | ICD-10-CM | POA: Insufficient documentation

## 2017-03-20 DIAGNOSIS — O26893 Other specified pregnancy related conditions, third trimester: Secondary | ICD-10-CM

## 2017-03-20 DIAGNOSIS — Z87891 Personal history of nicotine dependence: Secondary | ICD-10-CM | POA: Diagnosis not present

## 2017-03-20 DIAGNOSIS — O10919 Unspecified pre-existing hypertension complicating pregnancy, unspecified trimester: Secondary | ICD-10-CM

## 2017-03-20 DIAGNOSIS — Z880 Allergy status to penicillin: Secondary | ICD-10-CM

## 2017-03-20 DIAGNOSIS — O30043 Twin pregnancy, dichorionic/diamniotic, third trimester: Secondary | ICD-10-CM | POA: Diagnosis present

## 2017-03-20 DIAGNOSIS — F1190 Opioid use, unspecified, uncomplicated: Secondary | ICD-10-CM | POA: Diagnosis present

## 2017-03-20 DIAGNOSIS — O8612 Endometritis following delivery: Secondary | ICD-10-CM | POA: Diagnosis not present

## 2017-03-20 DIAGNOSIS — E669 Obesity, unspecified: Secondary | ICD-10-CM | POA: Diagnosis present

## 2017-03-20 DIAGNOSIS — Z7982 Long term (current) use of aspirin: Secondary | ICD-10-CM | POA: Diagnosis not present

## 2017-03-20 DIAGNOSIS — Z3A36 36 weeks gestation of pregnancy: Secondary | ICD-10-CM

## 2017-03-20 DIAGNOSIS — O9989 Other specified diseases and conditions complicating pregnancy, childbirth and the puerperium: Secondary | ICD-10-CM

## 2017-03-20 DIAGNOSIS — O1002 Pre-existing essential hypertension complicating childbirth: Principal | ICD-10-CM | POA: Diagnosis present

## 2017-03-20 DIAGNOSIS — D573 Sickle-cell trait: Secondary | ICD-10-CM | POA: Diagnosis present

## 2017-03-20 DIAGNOSIS — M549 Dorsalgia, unspecified: Secondary | ICD-10-CM

## 2017-03-20 DIAGNOSIS — O1092 Unspecified pre-existing hypertension complicating childbirth: Secondary | ICD-10-CM | POA: Diagnosis not present

## 2017-03-20 LAB — CBC
HEMATOCRIT: 38 % (ref 36.0–46.0)
HEMOGLOBIN: 11.9 g/dL — AB (ref 12.0–15.0)
MCH: 27.7 pg (ref 26.0–34.0)
MCHC: 31.3 g/dL (ref 30.0–36.0)
MCV: 88.6 fL (ref 78.0–100.0)
Platelets: 163 10*3/uL (ref 150–400)
RBC: 4.29 MIL/uL (ref 3.87–5.11)
RDW: 22.5 % — ABNORMAL HIGH (ref 11.5–15.5)
WBC: 10.2 10*3/uL (ref 4.0–10.5)

## 2017-03-20 LAB — TYPE AND SCREEN
ABO/RH(D): A POS
ANTIBODY SCREEN: NEGATIVE

## 2017-03-20 MED ORDER — PHENYLEPHRINE 40 MCG/ML (10ML) SYRINGE FOR IV PUSH (FOR BLOOD PRESSURE SUPPORT)
PREFILLED_SYRINGE | INTRAVENOUS | Status: AC
  Filled 2017-03-20: qty 10

## 2017-03-20 MED ORDER — LACTATED RINGERS IV SOLN
500.0000 mL | INTRAVENOUS | Status: DC | PRN
  Administered 2017-03-21: 500 mL via INTRAVENOUS

## 2017-03-20 MED ORDER — TRAMADOL HCL 50 MG PO TABS: 50.0000 mg | ORAL_TABLET | Freq: Four times a day (QID) | ORAL | 0 refills | Status: DC | PRN

## 2017-03-20 MED ORDER — ONDANSETRON HCL 4 MG/2ML IJ SOLN
4.0000 mg | Freq: Four times a day (QID) | INTRAMUSCULAR | Status: DC | PRN
  Administered 2017-03-20: 4 mg via INTRAVENOUS
  Filled 2017-03-20: qty 2

## 2017-03-20 MED ORDER — SOD CITRATE-CITRIC ACID 500-334 MG/5ML PO SOLN
30.0000 mL | ORAL | Status: DC | PRN
  Administered 2017-03-21: 30 mL via ORAL

## 2017-03-20 MED ORDER — CLINDAMYCIN PHOSPHATE 900 MG/50ML IV SOLN: 900.0000 mg | Freq: Once | INTRAVENOUS | Status: DC

## 2017-03-20 MED ORDER — EPHEDRINE 5 MG/ML INJ: 10.0000 mg | INTRAVENOUS | Status: DC | PRN

## 2017-03-20 MED ORDER — PHENYLEPHRINE 40 MCG/ML (10ML) SYRINGE FOR IV PUSH (FOR BLOOD PRESSURE SUPPORT): 80.0000 ug | PREFILLED_SYRINGE | INTRAVENOUS | Status: DC | PRN

## 2017-03-20 MED ORDER — FENTANYL CITRATE (PF) 100 MCG/2ML IJ SOLN
INTRAMUSCULAR | Status: AC
  Filled 2017-03-20: qty 2

## 2017-03-20 MED ORDER — OXYTOCIN BOLUS FROM INFUSION: 500.0000 mL | Freq: Once | INTRAVENOUS | Status: DC

## 2017-03-20 MED ORDER — OXYTOCIN 40 UNITS IN LACTATED RINGERS INFUSION - SIMPLE MED
1.0000 m[IU]/min | INTRAVENOUS | Status: DC
  Administered 2017-03-20: 2 m[IU]/min via INTRAVENOUS
  Administered 2017-03-21: 6 m[IU]/min via INTRAVENOUS

## 2017-03-20 MED ORDER — OXYCODONE-ACETAMINOPHEN 5-325 MG PO TABS: 2.0000 | ORAL_TABLET | ORAL | Status: DC | PRN

## 2017-03-20 MED ORDER — VANCOMYCIN HCL IN DEXTROSE 1-5 GM/200ML-% IV SOLN
1000.0000 mg | Freq: Two times a day (BID) | INTRAVENOUS | Status: DC
  Administered 2017-03-20 – 2017-03-21 (×2): 1000 mg via INTRAVENOUS
  Filled 2017-03-20 (×2): qty 200

## 2017-03-20 MED ORDER — ACETAMINOPHEN 325 MG PO TABS: 650.0000 mg | ORAL_TABLET | ORAL | Status: DC | PRN

## 2017-03-20 MED ORDER — LACTATED RINGERS IV SOLN
INTRAVENOUS | Status: DC
  Administered 2017-03-20: 19:00:00 via INTRAVENOUS

## 2017-03-20 MED ORDER — MISOPROSTOL 200 MCG PO TABS
ORAL_TABLET | ORAL | Status: AC
  Filled 2017-03-20: qty 4

## 2017-03-20 MED ORDER — DIPHENHYDRAMINE HCL 25 MG PO CAPS
25.0000 mg | ORAL_CAPSULE | Freq: Four times a day (QID) | ORAL | Status: DC | PRN
  Administered 2017-03-20: 25 mg via ORAL
  Filled 2017-03-20: qty 1

## 2017-03-20 MED ORDER — FENTANYL 2.5 MCG/ML BUPIVACAINE 1/10 % EPIDURAL INFUSION (WH - ANES)
INTRAMUSCULAR | Status: AC
  Filled 2017-03-20: qty 100

## 2017-03-20 MED ORDER — TERBUTALINE SULFATE 1 MG/ML IJ SOLN: 0.2500 mg | Freq: Once | INTRAMUSCULAR | Status: DC | PRN

## 2017-03-20 MED ORDER — FENTANYL CITRATE (PF) 100 MCG/2ML IJ SOLN
100.0000 ug | INTRAMUSCULAR | Status: DC | PRN
  Administered 2017-03-20: 100 ug via INTRAVENOUS

## 2017-03-20 MED ORDER — LACTATED RINGERS IV SOLN: 500.0000 mL | Freq: Once | INTRAVENOUS | Status: DC

## 2017-03-20 MED ORDER — LIDOCAINE HCL (PF) 1 % IJ SOLN
INTRAMUSCULAR | Status: DC | PRN
  Administered 2017-03-20: 3 mL via EPIDURAL
  Administered 2017-03-20: 5 mL via EPIDURAL
  Administered 2017-03-20: 2 mL via EPIDURAL

## 2017-03-20 MED ORDER — OXYCODONE-ACETAMINOPHEN 5-325 MG PO TABS: 1.0000 | ORAL_TABLET | ORAL | Status: DC | PRN

## 2017-03-20 MED ORDER — LACTATED RINGERS IV SOLN
INTRAVENOUS | Status: DC
  Administered 2017-03-20: 22:00:00 via INTRAUTERINE

## 2017-03-20 MED ORDER — FLEET ENEMA 7-19 GM/118ML RE ENEM: 1.0000 | ENEMA | RECTAL | Status: DC | PRN

## 2017-03-20 MED ORDER — OXYTOCIN 40 UNITS IN LACTATED RINGERS INFUSION - SIMPLE MED: 2.5000 [IU]/h | INTRAVENOUS | Status: DC

## 2017-03-20 MED ORDER — OXYTOCIN 40 UNITS IN LACTATED RINGERS INFUSION - SIMPLE MED
INTRAVENOUS | Status: AC
  Filled 2017-03-20: qty 1000

## 2017-03-20 MED ORDER — FENTANYL 2.5 MCG/ML BUPIVACAINE 1/10 % EPIDURAL INFUSION (WH - ANES)
14.0000 mL/h | INTRAMUSCULAR | Status: DC | PRN
  Administered 2017-03-20 – 2017-03-21 (×3): 14 mL/h via EPIDURAL
  Filled 2017-03-20 (×2): qty 100

## 2017-03-20 MED ORDER — DIPHENHYDRAMINE HCL 50 MG/ML IJ SOLN
12.5000 mg | INTRAMUSCULAR | Status: DC | PRN
  Administered 2017-03-20: 12.5 mg via INTRAVENOUS
  Filled 2017-03-20: qty 1

## 2017-03-20 MED ORDER — LIDOCAINE HCL (PF) 1 % IJ SOLN
30.0000 mL | INTRAMUSCULAR | Status: DC | PRN
  Filled 2017-03-20: qty 30

## 2017-03-20 NOTE — Progress Notes (Addendum)
Labor Progress Note Malachy Moodssey S Maxton is a 36 y.o. [id number] at 558w3d presented for SOL preterm. S: Patient doing well, and is comfortable laying in bed. Manually broke water at approximately 8pm, clear fluid.    O:  BP 93/82   Pulse (!) 107   Temp 99 F (37.2 C) (Oral)   Resp 16   LMP 07/08/2016   SpO2 100%   FHT: FHR-130bpm Variability: Moderate Accelerations: Present Decelerations:  Variables, return to baseline by end of ctx.   CVE: Dilation: 9 Dilation Complete Date: 03/20/17 Dilation Complete Time: 1740 Effacement (%): 80 Station: Ballotable Presentation: Vertex Exam by:: Drenda FreezeFran CNM Pitocin at 2710mu/min.   A&P: 36 y.o. [id number] 958w3d presenting for preterm SOL. #Labor  Protracted.   #Pain: epidural #FWB: Category 1 #GBS positive  Will start amnioinfusion  Atticus  Lum, Student-PA 9:09 PM  I spoke with and examined patient and agree with resident/PA/SNM's note and plan of care.  Cathie BeamsFran Cresenzo-Dishmon, CNM 03/20/2017 11:56 PM

## 2017-03-20 NOTE — Progress Notes (Signed)
Pt refusing further FHT r/t her back hurting and abdomen itching from the gel/bands.  Pt educated about labor precautions as well as induction date and to schedule appt in the clinic next week.  Pt verbalizes understanding.  Pt given Rx for Tramadol for back pain.  Pt agrees with plan of care.  DC'd home in good condition.

## 2017-03-20 NOTE — Anesthesia Preprocedure Evaluation (Signed)
Anesthesia Evaluation  Patient identified by MRN, date of birth, ID band Patient awake    Reviewed: Allergy & Precautions, H&P , NPO status , Patient's Chart, lab work & pertinent test results  History of Anesthesia Complications Negative for: history of anesthetic complications  Airway Mallampati: II  TM Distance: >3 FB Neck ROM: full    Dental  (+) Poor Dentition, Missing   Pulmonary former smoker,    Pulmonary exam normal breath sounds clear to auscultation       Cardiovascular hypertension, Normal cardiovascular exam Rhythm:regular Rate:Normal     Neuro/Psych Seizures -,  PSYCHIATRIC DISORDERS Bipolar Disorder Schizophrenia    GI/Hepatic GERD  Medicated,(+)     substance abuse  ,   Endo/Other  Obesity   Renal/GU negative Renal ROS  negative genitourinary   Musculoskeletal   Abdominal   Peds  Hematology  (+) Blood dyscrasia, anemia , Plt 163k   Anesthesia Other Findings   Reproductive/Obstetrics (+) Pregnancy Twins                             Anesthesia Physical  Anesthesia Plan  ASA: III  Anesthesia Plan: Epidural   Post-op Pain Management:    Induction:   PONV Risk Score and Plan: 2 and Treatment may vary due to age or medical condition  Airway Management Planned:   Additional Equipment:   Intra-op Plan:   Post-operative Plan:   Informed Consent: I have reviewed the patients History and Physical, chart, labs and discussed the procedure including the risks, benefits and alternatives for the proposed anesthesia with the patient or authorized representative who has indicated his/her understanding and acceptance.   Dental advisory given  Plan Discussed with:   Anesthesia Plan Comments: (Patient identified. Risks/Benefits/Options discussed with patient including but not limited to bleeding, infection, nerve damage, paralysis, failed block, incomplete pain control,  headache, blood pressure changes, nausea, vomiting, reactions to medication both or allergic, itching and postpartum back pain. Confirmed with bedside nurse the patient's most recent platelet count. Confirmed with patient that they are not currently taking any anticoagulation, have any bleeding history or any family history of bleeding disorders. Patient expressed understanding and wished to proceed. All questions were answered. )        Anesthesia Quick Evaluation

## 2017-03-20 NOTE — H&P (Signed)
LABOR AND DELIVERY ADMISSION HISTORY AND PHYSICAL NOTE  Hailey Moodssey S Hopfensperger is a 36 y.o. female [phone] with IUP at 3139w3d by LMP presenting for SOL preterm.  She reports positive fetal movement. She denies leakage of fluid or vaginal bleeding.  [facility] Pregnancy complications:  - twins, GBS pos, sickle cell trait, chronic HTN, amoxicillin allergy, hx of substance use  Clinic  [facility] Prenatal Labs  Dating  EDC 1/10  (LMP=6wk u/s ) Blood type:   A pos  Genetic Screen Declined Antibody: neg  Anatomic US  normal x 2 Rubella:  imm  GDM screening Early (a1c, cmp): pending   24-28wks:declined A1C < 4.2, 2 hour pp CBG 71 RPR:   neg  Flu vaccine  Declined HBsAg:   neg  TDaP vaccine  declined Rhogam: n/a HIV:  neg  Baby Food       both GBS:  (For PCN allergy, check sensitivities)  Contraception  depo Pap: NILM 10/2014  Circumcision  if boy yes   Pediatrician     Support Person  mother, brother daughter     Past Medical History: Past Medical History:  Diagnosis Date  . Anemia   . Bipolar 1 disorder (HCC)   . Chlamydia   . Chronic abdominal pain   . Chronic nausea   . Deliberate self-cutting   . History of substance abuse   . HSV-2 infection 2015  . Hypertension   . Infection    MRSA in 1995, negative since  . Schizophrenia (HCC)   . Seizures (HCC)    Not recently  . Sickle cell trait (HCC)   . Trichomonas infection     Past Surgical History: Past Surgical History:  Procedure Laterality Date  . DILATION AND CURETTAGE OF UTERUS    . HEMORROIDECTOMY  2010  . plastic surgery on face      Obstetrical History: OB History    Gravida Para Term Preterm AB Living   15 8 8  0 6 8   SAB TAB Ectopic Multiple Live Births   6 0 0 0 8      Social History: Social History   Socioeconomic History  . Marital status: Legally Separated    Spouse name: Not on file  . Number of children: Not on file  . Years of education: Not on file  . Highest  education level: Not on file  Social Needs  . Financial resource strain: Not on file  . Food insecurity - worry: Not on file  . Food insecurity - inability: Not on file  . Transportation needs - medical: Not on file  . Transportation needs - non-medical: Not on file  Occupational History  . Not on file  Tobacco Use  . Smoking status: Former Smoker    Packs/day: 0.50    Years: 1.50    Pack years: 0.75    Types: Cigarettes    Last attempt to quit: 04/09/2015    Years since quitting: 1.9  . Smokeless tobacco: Never Used  Substance and Sexual Activity  . Alcohol use: No    Comment: hx drug use  . Drug use: No    Comment: past use, 4 years ago  . Sexual activity: Not Currently    Birth control/protection: None    Comment: desires Depo Provera  Other Topics Concern  . Not on file  Social History Narrative  . Not on file    Family History: Family History  Problem Relation Age of Onset  . Cancer  Mother   . Heart failure Mother   . Hypertension Mother   . Stroke Mother   . Diabetes Mother   . Hypertension Maternal Grandmother   . Anesthesia problems Neg Hx     Allergies: Allergies  Allergen Reactions  . Peanut-Containing Drug Products Anaphylaxis, Itching and Rash  . Amoxicillin Hives, Swelling and Other (See Comments)    Has patient had a PCN reaction causing immediate rash, facial/tongue/throat swelling, SOB or lightheadedness with hypotension: No Has patient had a PCN reaction causing severe rash involving mucus membranes or skin necrosis: No Has patient had a PCN reaction that required hospitalization No Has patient had a PCN reaction occurring within the last 10 years: Yes If all of the above answers are "NO", then may proceed with Cephalosporin use.  . Latex Hives and Itching    Medications Prior to Admission  Medication Sig Dispense Refill Last Dose  . aspirin EC 81 MG tablet Take 1 tablet (81 mg total) by mouth daily. Take after 12 weeks for prevention of  preeclampsia later in pregnancy 300 tablet 2 03/19/2017 at Unknown time  . docusate sodium (COLACE) 100 MG capsule Take 1 capsule (100 mg total) by mouth daily. 10 capsule 0 03/19/2017 at Unknown time  . ferrous sulfate 325 (65 FE) MG tablet Take 1 tablet (325 mg total) by mouth daily. (Patient not taking: Reported on 01/14/2017) 30 tablet 3 More than a month at Unknown time  . omeprazole (PRILOSEC OTC) 20 MG tablet Take 1 tablet (20 mg total) daily by mouth. 30 tablet 3 03/19/2017 at Unknown time  . Prenatal Vit-Fe Fumarate-FA (PREPLUS) 27-1 MG TABS Take 1 tablet by mouth daily. 30 tablet 13 03/19/2017 at Unknown time  . traMADol (ULTRAM) 50 MG tablet Take 1 tablet (50 mg total) by mouth every 6 (six) hours as needed. 15 tablet 0   . valACYclovir (VALTREX) 1000 MG tablet Take 0.5 tablets (500 mg total) 2 (two) times daily by mouth. 60 tablet 2 03/19/2017 at Unknown time     Review of Systems  All systems reviewed and negative except as stated in HPI  Physical Exam Blood pressure (!) 153/93, pulse 97, temperature 99 F (37.2 C), temperature source Oral, last menstrual period 07/08/2016, SpO2 98 %, not currently breastfeeding. General appearance: alert, cooperative, appears stated age and mild distress Lungs: no respiratory distress Heart: regular rate  Abdomen: soft, non-tender;  Extremities: No calf swelling or tenderness Presentation: vertex/transverse (twins) Fetal monitoring: exterior Uterine activity: toco Dilation: 6 Effacement (%): 80 Exam by:: Millner, RN   Prenatal labs: ABO, Rh: --/--/A POS (12/08 0654) Antibody: NEG (12/08 0654) Rubella: 1.43 (06/14 0835) RPR: Non Reactive (11/26 1222)  HBsAg: Negative (06/14 0835)  HIV:   NR GC/Chlamydia: neg GBS: Positive (11/26 0000)  1 hr Glucola: not on file Genetic screening:  declined Anatomy US: normal  Prenatal Transfer Tool  Maternal Diabetes: Not on file Genetic Screening: Declined Maternal Ultrasounds/Referrals:  Normal Fetal Ultrasounds or other Referrals:  None Maternal Substance Abuse:  Yes:  Type: Other: unknown hx Significant Maternal Medications:  None Significant Maternal Lab Results: None  Results for orders placed or performed during the hospital encounter of 03/20/17 (from the past 24 hour(s))  CBC   Collection Time: 03/20/17  2:48 PM  Result Value Ref Range   WBC 10.2 4.0 - 10.5 K/uL   RBC 4.29 3.87 - 5.11 MIL/uL   Hemoglobin 11.9 (L) 12.0 - 15.0 g/dL   HCT 16.1 09.6 - 04.5 %  MCV 88.6 78.0 - 100.0 fL   MCH 27.7 26.0 - 34.0 pg   MCHC 31.3 30.0 - 36.0 g/dL   RDW 86.522.5 (H) 78.411.5 - 69.615.5 %   Platelets 163 150 - 400 K/uL    Patient Active Problem List   Diagnosis Date Noted  . Back pain affecting pregnancy in third trimester 03/20/2017  . Labor, premature with delivery 03/20/2017  . Funic presentation, fetus 1 03/10/2017  . Preterm labor in third trimester 03/03/2017  . Preterm labor 03/03/2017  . Chronic hypertension in pregnancy 02/21/2017  . GERD (gastroesophageal reflux disease) 02/21/2017  . Umbilical hernia 01/13/2017  . Hereditary disease in family possibly affecting fetus, fetus 1   . Penicillin allergy 10/02/2016  . Opiate use 09/25/2016  . Supervision of high risk pregnancy, antepartum 09/19/2016  . History of herpes genitalis 09/19/2016  . History of trichomoniasis 09/19/2016  . Advanced maternal age in multigravida, second trimester 09/19/2016  . Grand multiparity 09/19/2016  . Short interval between pregnancies affecting pregnancy in first trimester, antepartum 09/19/2016  . Dichorionic diamniotic twin gestation 09/19/2016  . Chronic hypertension 07/03/2016  . Tobacco abuse 12/19/2014  . Bipolar disorder (HCC) 12/19/2014  . History of substance abuse 12/19/2014  . Sickle cell trait (HCC) 12/19/2014    Assessment: [name] is a 36 y.o. [id number] at 4280w3d here for SOL preterm w/ twins  #Labor: expectant management #Pain: Per maternal  request #FWB: Cat 1 #ID:  GBS pos #MOF: ? #MOC:depo #Circ:  Yes, for boy  Marthenia RollingScott Bland 03/20/2017, 3:22 PM  I confirm that I have verified the information documented in the resident's note and that I have also personally reperformed the physical exam and all medical decision making activities.  The patient was seen and examined by me also Agree with note NST reactive and reassuring UCs as listed Cervical exams as listed in note  Aviva SignsWilliams, Lajune Perine L, CNM

## 2017-03-20 NOTE — MAU Note (Addendum)
Pt. Arrived via EMS for active labor, contractions are every 2 minutes.  Positive for fetal (x2) movement. Denies SROM.  Twin A EFM applied - FHR 140s, Twin B EFM applied FHR 130s. Pt. Transferred to L&D for delivery. (Dr. Parke SimmersBland called for admit to L&D).

## 2017-03-20 NOTE — MAU Note (Signed)
Both fetuses confirmed vertex presentation via bedside US per Dr Emelda FearFerguson.

## 2017-03-20 NOTE — MAU Note (Signed)
Pt laying on side; says she can't lay on her back due to pain from bed. Also  unable to pick up cntrx when patient laying on side although patient is reporting having them; unsure how far apart. Also scratching abdomen and moving monitors constantly. Provider notified and Benadryl requested. RN asked patient to return to back for better tracing of babies and cntrx. Pillow placed under back. 25 mg Benedryl given. Patient advised that if she lays still on back for 20 mins so that an adequate NST is obtained,she will be able to come off the monitor per Dr Emelda FearFerguson. Pt agreed to try.

## 2017-03-20 NOTE — Progress Notes (Signed)
Patient seen for RN labor check. Bedside US with Dr. Emelda FearFerguson shows vertex/vertex twins. Cervical exam unchanged over 4 hours. Patient scheduled for induction at 38wks. Message sent to clinic to have routine prenatal visit next week.   Hailey AdaJazma Tamzin Bertling, DO OB Fellow

## 2017-03-20 NOTE — Discharge Instructions (Signed)
Schedule prenatal visit for early next week  Induction date scheduled for Dec. 24th at [facility]7am  Braxton Hicks Contractions Contractions of the uterus can occur throughout pregnancy, but they are not always a sign that you are in labor. You may have practice contractions called Braxton Hicks contractions. These false labor contractions are sometimes confused with true labor. What are Hailey PeltonBraxton Hicks contractions? Braxton Hicks contractions are tightening movements that occur in the muscles of the uterus before labor. Unlike true labor contractions, these contractions do not result in opening (dilation) and thinning of the cervix. Toward the end of pregnancy (32-34 weeks), Braxton Hicks contractions can happen more often and may become stronger. These contractions are sometimes difficult to tell apart from true labor because they can be very uncomfortable. You should not feel embarrassed if you go to the hospital with false labor. Sometimes, the only way to tell if you are in true labor is for your health care provider to look for changes in the cervix. The health care provider will do a physical exam and may monitor your contractions. If you are not in true labor, the exam should show that your cervix is not dilating and your water has not broken. If there are no prenatal problems or other health problems associated with your pregnancy, it is completely safe for you to be sent home with false labor. You may continue to have Braxton Hicks contractions until you go into true labor. How can I tell the difference between true labor and false labor?  Differences ? False labor ? Contractions last 30-70 seconds.: Contractions are usually shorter and not as strong as true labor contractions. ? Contractions become very regular.: Contractions are usually irregular. ? Discomfort is usually felt in the top of the uterus, and it spreads to the lower abdomen and low back.: Contractions are often felt in the front of  the lower abdomen and in the groin. ? Contractions do not go away with walking.: Contractions may go away when you walk around or change positions while lying down. ? Contractions usually become more intense and increase in frequency.: Contractions get weaker and are shorter-lasting as time goes on. ? The cervix dilates and gets thinner.: The cervix usually does not dilate or become thin. Follow these instructions at home:  Take over-the-counter and prescription medicines only as told by your health care provider.  Keep up with your usual exercises and follow other instructions from your health care provider.  Eat and drink lightly if you think you are going into labor.  If Braxton Hicks contractions are making you uncomfortable: ? Change your position from lying down or resting to walking, or change from walking to resting. ? Sit and rest in a tub of warm water. ? Drink enough fluid to keep your urine clear or pale yellow. Dehydration may cause these contractions. ? Do slow and deep breathing several times an hour.  Keep all follow-up prenatal visits as told by your health care provider. This is important. Contact a health care provider if:  You have a fever.  You have continuous pain in your abdomen. Get help right away if:  Your contractions become stronger, more regular, and closer together.  You have fluid leaking or gushing from your vagina.  You pass blood-tinged mucus (bloody show).  You have bleeding from your vagina.  You have low back pain that you never had before.  You feel your babys head pushing down and causing pelvic pressure.  Your baby is  not moving inside you as much as it used to. Summary  Contractions that occur before labor are called Braxton Hicks contractions, false labor, or practice contractions.  Braxton Hicks contractions are usually shorter, weaker, farther apart, and less regular than true labor contractions. True labor contractions usually  become progressively stronger and regular and they become more frequent.  Manage discomfort from [facility] contractions by changing position, resting in a warm bath, drinking plenty of water, or practicing deep breathing. This information is not intended to replace advice given to you by your health care provider. Make sure you discuss any questions you have with your health care provider. Document Released: 03/25/2005 Document Revised: 02/12/2016 Document Reviewed: 02/12/2016 Elsevier Interactive Patient Education  2017 [employer]

## 2017-03-20 NOTE — Anesthesia Procedure Notes (Signed)
Epidural Patient location during procedure: OB Start time: 03/20/2017 3:51 PM End time: 03/20/2017 3:57 PM  Staffing Anesthesiologist: Cecile Hearingurk, Stephen Edward, MD Performed: anesthesiologist   Preanesthetic Checklist Completed: patient identified, pre-op evaluation, timeout performed, IV checked, risks and benefits discussed and monitors and equipment checked  Epidural Patient position: sitting Prep: DuraPrep Patient monitoring: blood pressure and continuous pulse ox Approach: midline Location: L3-L4 Injection technique: LOR air  Needle:  Needle type: Tuohy  Needle gauge: 17 G Needle length: 9 cm Needle insertion depth: 5 cm Catheter size: 19 Gauge Catheter at skin depth: 10 cm Test dose: negative and Other (1% Lidocaine)  Additional Notes Patient identified.  Risk benefits discussed including failed block, incomplete pain control, headache, nerve damage, paralysis, blood pressure changes, nausea, vomiting, reactions to medication both toxic or allergic, and postpartum back pain.  Patient expressed understanding and wished to proceed.  All questions were answered.  Sterile technique used throughout procedure and epidural site dressed with sterile barrier dressing. No paresthesia or other complications noted. The patient did not experience any signs of intravascular injection such as tinnitus or metallic taste in mouth nor signs of intrathecal spread such as rapid motor block. Please see nursing notes for vital signs. Reason for block:procedure for pain

## 2017-03-20 NOTE — MAU Note (Signed)
Cntrx started around 0130. Denies leaking leaking or bleeding. +FM.

## 2017-03-20 NOTE — Progress Notes (Signed)
Attempted to readjust EFM again to obtain both A and B, but patient states "my back is killing me" and refusing to move from position on left lateral.  Tracing B, but unable to trace A r/t positioning. MD aware of noncompliance.

## 2017-03-20 NOTE — Telephone Encounter (Signed)
Preadmission screen  

## 2017-03-21 ENCOUNTER — Encounter (HOSPITAL_COMMUNITY)
Admission: AD | Disposition: A | Discharge status: Home / Self Care | Payer: Self-pay | Source: Ambulatory Visit | Attending: Obstetrics and Gynecology

## 2017-03-21 ENCOUNTER — Encounter (HOSPITAL_COMMUNITY): Payer: Self-pay | Admitting: Obstetrics and Gynecology

## 2017-03-21 ENCOUNTER — Encounter (HOSPITAL_COMMUNITY): Payer: Self-pay | Admitting: Anesthesiology

## 2017-03-21 DIAGNOSIS — Z3A36 36 weeks gestation of pregnancy: Secondary | ICD-10-CM

## 2017-03-21 DIAGNOSIS — O99824 Streptococcus B carrier state complicating childbirth: Secondary | ICD-10-CM

## 2017-03-21 DIAGNOSIS — O1092 Unspecified pre-existing hypertension complicating childbirth: Secondary | ICD-10-CM

## 2017-03-21 DIAGNOSIS — O30043 Twin pregnancy, dichorionic/diamniotic, third trimester: Secondary | ICD-10-CM

## 2017-03-21 LAB — RPR: RPR: NONREACTIVE

## 2017-03-21 SURGERY — Surgical Case
Anesthesia: Regional | Site: Abdomen | Wound class: Clean Contaminated

## 2017-03-21 MED ORDER — PROMETHAZINE HCL 25 MG/ML IJ SOLN: 6.2500 mg | INTRAMUSCULAR | Status: DC | PRN

## 2017-03-21 MED ORDER — SCOPOLAMINE 1 MG/3DAYS TD PT72
MEDICATED_PATCH | TRANSDERMAL | Status: AC
  Filled 2017-03-21: qty 1

## 2017-03-21 MED ORDER — PHENYLEPHRINE 40 MCG/ML (10ML) SYRINGE FOR IV PUSH (FOR BLOOD PRESSURE SUPPORT)
PREFILLED_SYRINGE | INTRAVENOUS | Status: AC
  Filled 2017-03-21: qty 10

## 2017-03-21 MED ORDER — CLINDAMYCIN PHOSPHATE 900 MG/50ML IV SOLN: 900.0000 mg | INTRAVENOUS | Status: DC

## 2017-03-21 MED ORDER — OXYTOCIN 10 UNIT/ML IJ SOLN
INTRAVENOUS | Status: DC | PRN
  Administered 2017-03-21: 40 [IU] via INTRAVENOUS

## 2017-03-21 MED ORDER — NALOXONE HCL 0.4 MG/ML IJ SOLN
1.0000 ug/kg/h | INTRAVENOUS | Status: DC | PRN
  Filled 2017-03-21: qty 5

## 2017-03-21 MED ORDER — OXYCODONE HCL 5 MG PO TABS
5.0000 mg | ORAL_TABLET | ORAL | Status: DC | PRN
  Administered 2017-03-21 – 2017-03-22 (×3): 5 mg via ORAL
  Filled 2017-03-21 (×2): qty 1

## 2017-03-21 MED ORDER — OXYTOCIN 10 UNIT/ML IJ SOLN
INTRAMUSCULAR | Status: AC
  Filled 2017-03-21: qty 4

## 2017-03-21 MED ORDER — NALBUPHINE HCL 10 MG/ML IJ SOLN
5.0000 mg | INTRAMUSCULAR | Status: DC | PRN
  Administered 2017-03-21: 5 mg via INTRAVENOUS
  Filled 2017-03-21: qty 1

## 2017-03-21 MED ORDER — OXYCODONE HCL 5 MG/5ML PO SOLN: 5.0000 mg | Freq: Once | ORAL | Status: DC | PRN

## 2017-03-21 MED ORDER — MISOPROSTOL 200 MCG PO TABS: 1000.0000 ug | ORAL_TABLET | Freq: Once | ORAL | Status: DC

## 2017-03-21 MED ORDER — ONDANSETRON HCL 4 MG/2ML IJ SOLN
INTRAMUSCULAR | Status: DC | PRN
  Administered 2017-03-21: 4 mg via INTRAVENOUS

## 2017-03-21 MED ORDER — NALBUPHINE HCL 10 MG/ML IJ SOLN
5.0000 mg | INTRAMUSCULAR | Status: DC | PRN
  Administered 2017-03-22: 5 mg via SUBCUTANEOUS
  Filled 2017-03-21: qty 1

## 2017-03-21 MED ORDER — NALOXONE HCL 0.4 MG/ML IJ SOLN: 0.4000 mg | INTRAMUSCULAR | Status: DC | PRN

## 2017-03-21 MED ORDER — PROMETHAZINE HCL 25 MG/ML IJ SOLN
12.5000 mg | Freq: Four times a day (QID) | INTRAMUSCULAR | Status: DC | PRN
  Filled 2017-03-21: qty 1

## 2017-03-21 MED ORDER — DIPHENHYDRAMINE HCL 25 MG PO CAPS
25.0000 mg | ORAL_CAPSULE | Freq: Four times a day (QID) | ORAL | Status: DC | PRN
  Filled 2017-03-21 (×2): qty 1

## 2017-03-21 MED ORDER — NALBUPHINE HCL 10 MG/ML IJ SOLN: 5.0000 mg | Freq: Once | INTRAMUSCULAR | Status: DC | PRN

## 2017-03-21 MED ORDER — OXYTOCIN 40 UNITS IN LACTATED RINGERS INFUSION - SIMPLE MED: 2.5000 [IU]/h | INTRAVENOUS | Status: AC

## 2017-03-21 MED ORDER — FENTANYL CITRATE (PF) 100 MCG/2ML IJ SOLN
INTRAMUSCULAR | Status: AC
  Filled 2017-03-21: qty 2

## 2017-03-21 MED ORDER — SOD CITRATE-CITRIC ACID 500-334 MG/5ML PO SOLN: 30.0000 mL | ORAL | Status: DC

## 2017-03-21 MED ORDER — PRENATAL MULTIVITAMIN CH
1.0000 | ORAL_TABLET | Freq: Every day | ORAL | Status: DC
  Administered 2017-03-22 – 2017-03-24 (×3): 1 via ORAL
  Filled 2017-03-21 (×3): qty 1

## 2017-03-21 MED ORDER — LACTATED RINGERS IV SOLN: INTRAVENOUS | Status: DC

## 2017-03-21 MED ORDER — NALBUPHINE HCL 10 MG/ML IJ SOLN
5.0000 mg | Freq: Once | INTRAMUSCULAR | Status: AC
  Administered 2017-03-21: 5 mg via INTRAVENOUS
  Filled 2017-03-21: qty 1

## 2017-03-21 MED ORDER — LACTATED RINGERS IV SOLN
INTRAVENOUS | Status: DC | PRN
  Administered 2017-03-21 (×2): via INTRAVENOUS

## 2017-03-21 MED ORDER — PROMETHAZINE HCL 25 MG PO TABS
25.0000 mg | ORAL_TABLET | Freq: Four times a day (QID) | ORAL | Status: DC | PRN
  Administered 2017-03-21 – 2017-03-24 (×5): 25 mg via ORAL
  Filled 2017-03-21 (×5): qty 1

## 2017-03-21 MED ORDER — MORPHINE SULFATE (PF) 0.5 MG/ML IJ SOLN
INTRAMUSCULAR | Status: AC
  Filled 2017-03-21: qty 10

## 2017-03-21 MED ORDER — WITCH HAZEL-GLYCERIN EX PADS: 1.0000 "application " | MEDICATED_PAD | CUTANEOUS | Status: DC | PRN

## 2017-03-21 MED ORDER — GENTAMICIN SULFATE 40 MG/ML IJ SOLN: 5.0000 mg/kg | INTRAMUSCULAR | Status: DC

## 2017-03-21 MED ORDER — OXYCODONE HCL 5 MG PO TABS: 5.0000 mg | ORAL_TABLET | Freq: Once | ORAL | Status: DC | PRN

## 2017-03-21 MED ORDER — SOD CITRATE-CITRIC ACID 500-334 MG/5ML PO SOLN
ORAL | Status: AC
  Filled 2017-03-21: qty 15

## 2017-03-21 MED ORDER — SODIUM CHLORIDE 0.9% FLUSH: 3.0000 mL | INTRAVENOUS | Status: DC | PRN

## 2017-03-21 MED ORDER — KETOROLAC TROMETHAMINE 30 MG/ML IJ SOLN
INTRAMUSCULAR | Status: AC
  Administered 2017-03-21: 30 mg
  Filled 2017-03-21: qty 1

## 2017-03-21 MED ORDER — MORPHINE SULFATE (PF) 0.5 MG/ML IJ SOLN
INTRAMUSCULAR | Status: DC | PRN
  Administered 2017-03-21: 3 mg via EPIDURAL

## 2017-03-21 MED ORDER — MENTHOL 3 MG MT LOZG
1.0000 | LOZENGE | OROMUCOSAL | Status: DC | PRN
  Filled 2017-03-21: qty 9

## 2017-03-21 MED ORDER — POLYETHYLENE GLYCOL 3350 17 G PO PACK
17.0000 g | PACK | Freq: Every day | ORAL | Status: DC
  Administered 2017-03-22 – 2017-03-24 (×2): 17 g via ORAL
  Filled 2017-03-21 (×3): qty 1

## 2017-03-21 MED ORDER — ONDANSETRON HCL 4 MG/2ML IJ SOLN: 4.0000 mg | Freq: Three times a day (TID) | INTRAMUSCULAR | Status: DC | PRN

## 2017-03-21 MED ORDER — MEPERIDINE HCL 25 MG/ML IJ SOLN: 6.2500 mg | INTRAMUSCULAR | Status: DC | PRN

## 2017-03-21 MED ORDER — ACETAMINOPHEN 325 MG PO TABS
650.0000 mg | ORAL_TABLET | ORAL | Status: DC | PRN
  Administered 2017-03-21 – 2017-03-23 (×7): 650 mg via ORAL
  Filled 2017-03-21 (×8): qty 2

## 2017-03-21 MED ORDER — HYDROMORPHONE HCL 1 MG/ML IJ SOLN
INTRAMUSCULAR | Status: AC
  Filled 2017-03-21: qty 1

## 2017-03-21 MED ORDER — TETANUS-DIPHTH-ACELL PERTUSSIS 5-2.5-18.5 LF-MCG/0.5 IM SUSP
0.5000 mL | Freq: Once | INTRAMUSCULAR | Status: DC
  Filled 2017-03-21: qty 0.5

## 2017-03-21 MED ORDER — GENTAMICIN SULFATE 40 MG/ML IJ SOLN
Freq: Once | INTRAVENOUS | Status: AC
  Administered 2017-03-21: 114 mL via INTRAVENOUS
  Filled 2017-03-21: qty 8

## 2017-03-21 MED ORDER — PROMETHAZINE HCL 25 MG/ML IJ SOLN
12.5000 mg | Freq: Once | INTRAMUSCULAR | Status: AC
  Administered 2017-03-21: 12.5 mg via INTRAVENOUS
  Filled 2017-03-21: qty 1

## 2017-03-21 MED ORDER — DIBUCAINE 1 % RE OINT
1.0000 "application " | TOPICAL_OINTMENT | RECTAL | Status: DC | PRN
  Filled 2017-03-21: qty 28

## 2017-03-21 MED ORDER — ONDANSETRON HCL 4 MG/2ML IJ SOLN
INTRAMUSCULAR | Status: AC
  Filled 2017-03-21: qty 2

## 2017-03-21 MED ORDER — DIPHENHYDRAMINE HCL 25 MG PO CAPS
25.0000 mg | ORAL_CAPSULE | ORAL | Status: DC | PRN
  Filled 2017-03-21: qty 1

## 2017-03-21 MED ORDER — IBUPROFEN 600 MG PO TABS
600.0000 mg | ORAL_TABLET | Freq: Four times a day (QID) | ORAL | Status: DC
  Administered 2017-03-21 – 2017-03-24 (×11): 600 mg via ORAL
  Filled 2017-03-21 (×12): qty 1

## 2017-03-21 MED ORDER — DIPHENHYDRAMINE HCL 50 MG/ML IJ SOLN
12.5000 mg | INTRAMUSCULAR | Status: DC | PRN
Start: 2017-03-21 — End: 2017-03-24

## 2017-03-21 MED ORDER — PHENYLEPHRINE HCL 10 MG/ML IJ SOLN
INTRAMUSCULAR | Status: DC | PRN
Start: 2017-03-21 — End: 2017-03-21
  Administered 2017-03-21: 80 ug via INTRAVENOUS

## 2017-03-21 MED ORDER — DEXTROSE 5 % IV SOLN
500.0000 mg | Freq: Once | INTRAVENOUS | Status: AC
  Administered 2017-03-21 (×2): 500 mg via INTRAVENOUS
  Filled 2017-03-21: qty 500

## 2017-03-21 MED ORDER — COCONUT OIL OIL
1.0000 "application " | TOPICAL_OIL | Status: DC | PRN
  Filled 2017-03-21: qty 120

## 2017-03-21 MED ORDER — SCOPOLAMINE 1 MG/3DAYS TD PT72
MEDICATED_PATCH | TRANSDERMAL | Status: DC | PRN
  Administered 2017-03-21: 1 via TRANSDERMAL

## 2017-03-21 MED ORDER — MISOPROSTOL 200 MCG PO TABS
1000.0000 ug | ORAL_TABLET | Freq: Once | ORAL | Status: AC
  Administered 2017-03-21: 1000 ug via VAGINAL

## 2017-03-21 MED ORDER — SIMETHICONE 80 MG PO CHEW
80.0000 mg | CHEWABLE_TABLET | Freq: Three times a day (TID) | ORAL | Status: DC
  Administered 2017-03-21 – 2017-03-24 (×8): 80 mg via ORAL
  Filled 2017-03-21 (×12): qty 1

## 2017-03-21 MED ORDER — LIDOCAINE-EPINEPHRINE (PF) 2 %-1:200000 IJ SOLN
INTRAMUSCULAR | Status: DC | PRN
  Administered 2017-03-21: 2 mL via INTRADERMAL
  Administered 2017-03-21: 3 mL via INTRADERMAL
  Administered 2017-03-21: 5 mL via INTRADERMAL

## 2017-03-21 MED ORDER — HYDROMORPHONE HCL 1 MG/ML IJ SOLN
0.2500 mg | INTRAMUSCULAR | Status: DC | PRN
  Administered 2017-03-21 (×2): 0.5 mg via INTRAVENOUS

## 2017-03-21 MED ORDER — SENNOSIDES-DOCUSATE SODIUM 8.6-50 MG PO TABS
2.0000 | ORAL_TABLET | Freq: Every evening | ORAL | Status: DC | PRN
  Administered 2017-03-21 – 2017-03-23 (×2): 2 via ORAL
  Filled 2017-03-21 (×3): qty 2

## 2017-03-21 MED ORDER — SODIUM CHLORIDE 0.9 % IR SOLN
Status: DC | PRN
  Administered 2017-03-21: 1

## 2017-03-21 MED ORDER — SCOPOLAMINE 1 MG/3DAYS TD PT72: 1.0000 | MEDICATED_PATCH | Freq: Once | TRANSDERMAL | Status: DC

## 2017-03-21 MED ORDER — MISOPROSTOL 200 MCG PO TABS
ORAL_TABLET | ORAL | Status: AC
  Filled 2017-03-21: qty 5

## 2017-03-21 MED ORDER — OXYCODONE HCL 5 MG PO TABS
10.0000 mg | ORAL_TABLET | ORAL | Status: DC | PRN
  Administered 2017-03-22 – 2017-03-24 (×11): 10 mg via ORAL
  Filled 2017-03-21 (×12): qty 2

## 2017-03-21 SURGICAL SUPPLY — 38 items
ADH SKN CLS APL DERMABOND .7 (GAUZE/BANDAGES/DRESSINGS) ×1
APL SKNCLS STERI-STRIP NONHPOA (GAUZE/BANDAGES/DRESSINGS) ×1
BENZOIN TINCTURE PRP APPL 2/3 (GAUZE/BANDAGES/DRESSINGS) ×3 IMPLANT
CANISTER SUCT 3000ML PPV (MISCELLANEOUS) ×3 IMPLANT
CHLORAPREP W/TINT 26ML (MISCELLANEOUS) ×3 IMPLANT
CLOSURE STERI STRIP 1/2 X4 (GAUZE/BANDAGES/DRESSINGS) ×2 IMPLANT
DERMABOND ADVANCED (GAUZE/BANDAGES/DRESSINGS) ×2
DERMABOND ADVANCED .7 DNX12 (GAUZE/BANDAGES/DRESSINGS) ×1 IMPLANT
DRSG OPSITE POSTOP 4X10 (GAUZE/BANDAGES/DRESSINGS) ×3 IMPLANT
ELECT REM PT RETURN 9FT ADLT (ELECTROSURGICAL) ×3
ELECTRODE REM PT RTRN 9FT ADLT (ELECTROSURGICAL) ×1 IMPLANT
EXTRACTOR VACUUM KIWI (MISCELLANEOUS) ×3 IMPLANT
GLOVE BIOGEL PI IND STRL 7.0 (GLOVE) ×2 IMPLANT
GLOVE BIOGEL PI IND STRL 7.5 (GLOVE) ×1 IMPLANT
GLOVE BIOGEL PI INDICATOR 7.0 (GLOVE) ×4
GLOVE BIOGEL PI INDICATOR 7.5 (GLOVE) ×2
GLOVE SKINSENSE NS SZ7.0 (GLOVE) ×2
GLOVE SKINSENSE STRL SZ7.0 (GLOVE) ×1 IMPLANT
GOWN STRL REUS W/ TWL LRG LVL3 (GOWN DISPOSABLE) ×2 IMPLANT
GOWN STRL REUS W/ TWL XL LVL3 (GOWN DISPOSABLE) ×1 IMPLANT
GOWN STRL REUS W/TWL LRG LVL3 (GOWN DISPOSABLE) ×6
GOWN STRL REUS W/TWL XL LVL3 (GOWN DISPOSABLE) ×3
NS IRRIG 1000ML POUR BTL (IV SOLUTION) ×3 IMPLANT
PACK C SECTION WH (CUSTOM PROCEDURE TRAY) ×3 IMPLANT
PAD ABD 7.5X8 STRL (GAUZE/BANDAGES/DRESSINGS) ×3 IMPLANT
PAD OB MATERNITY 4.3X12.25 (PERSONAL CARE ITEMS) ×3 IMPLANT
PAD PREP 24X48 CUFFED NSTRL (MISCELLANEOUS) ×3 IMPLANT
PENCIL SMOKE EVAC W/HOLSTER (ELECTROSURGICAL) ×3 IMPLANT
SPONGE LAP 18X18 X RAY DECT (DISPOSABLE) ×2 IMPLANT
STRIP CLOSURE SKIN 1/2X4 (GAUZE/BANDAGES/DRESSINGS) ×2 IMPLANT
SUT MNCRL 0 VIOLET CTX 36 (SUTURE) ×2 IMPLANT
SUT MON AB 4-0 PS1 27 (SUTURE) ×3 IMPLANT
SUT MONOCRYL 0 CTX 36 (SUTURE) ×4
SUT PLAIN 2 0 XLH (SUTURE) ×3 IMPLANT
SUT VIC AB 0 CT1 36 (SUTURE) ×6 IMPLANT
SUT VIC AB 3-0 CT1 27 (SUTURE) ×3
SUT VIC AB 3-0 CT1 TAPERPNT 27 (SUTURE) ×1 IMPLANT
TOWEL OR 17X24 6PK STRL BLUE (TOWEL DISPOSABLE) ×6 IMPLANT

## 2017-03-21 NOTE — Anesthesia Postprocedure Evaluation (Signed)
Anesthesia Post Note  Patient: [name]  Procedure(s) Performed: CESAREAN SECTION (N/A Abdomen)     Patient location during evaluation: Mother Baby Anesthesia Type: Epidural Level of consciousness: awake and alert Pain management: pain level controlled Vital Signs Assessment: post-procedure vital signs reviewed and stable Respiratory status: spontaneous breathing, nonlabored ventilation and respiratory function stable Cardiovascular status: stable Postop Assessment: no headache, no backache and epidural receding Anesthetic complications: no    Last Vitals:  Vitals:   03/21/17 1200 03/21/17 1215  BP:    Pulse: (!) 121   Resp: (!) 26   Temp:  (!) 39.3 C  SpO2: (!) 88%     Last Pain:  Vitals:   03/21/17 1230  TempSrc:   PainSc: 3    Pain Goal:                 [name]

## 2017-03-21 NOTE — Anesthesia Postprocedure Evaluation (Signed)
Anesthesia Post Note  Patient: [name]  Procedure(s) Performed: CESAREAN SECTION (N/A Abdomen)     Patient location during evaluation: Mother Baby Anesthesia Type: Epidural Level of consciousness: awake and alert, oriented and patient cooperative Pain management: pain level controlled Vital Signs Assessment: post-procedure vital signs reviewed and stable Respiratory status: spontaneous breathing Cardiovascular status: stable Postop Assessment: no headache, epidural receding, patient able to bend at knees and no signs of nausea or vomiting Anesthetic complications: no Comments: Pt sleeping upon entering the room.  Pt had to be aroused to perform interview.  Reports pain score of 10.    Last Vitals:  Vitals:   03/21/17 1500 03/21/17 1605  BP: 126/66 131/60  Pulse: 96 97  Resp: 18 18  Temp: 37.3 C 37.3 C  SpO2: 97% 97%    Last Pain:  Vitals:   03/21/17 1610  TempSrc:   PainSc: 5    Pain Goal:                 [facility] Center[facility],Keller Bounds

## 2017-03-21 NOTE — Op Note (Signed)
Operative Note   SURGERY DATE: 03/21/2017  PRE-OP DIAGNOSIS:  *Pregnancy at 36/4 *Arrest of dilation for fetus B *Dichorionic-diamionitic twins with delivery of fetus A approximately 16 hours prior *GBS positive *Chronic hypertension *Advanced maternal age  POST-OP DIAGNOSIS: Same. Direct OP   PROCEDURE: primary low transverse cesarean section via pfannenstiel skin incision with double layer uterine closure  SURGEON: Surgeon(s) and Role:    [place] Bing* Koraima Albertsen, MD - Primary  ASSISTANT:    Levie Heritage* Stinson, Jacob J, DO - Assisting  ANESTHESIA: epidural  ESTIMATED BLOOD LOSS: 600mL  DRAINS: 200mL UOP via indwelling foley  TOTAL IV FLUIDS: 1300mL crystalloid  VTE PROPHYLAXIS: SCDs to bilateral lower extremities  ANTIBIOTICS: Patient was getting vancomycin for GBS positive with PCN allergy. Clindamycin and Gentamycin were given within one hour of skin incision and azithromycin 500mg  IV x 1 was given intra-operatively  SPECIMENS: placenta to path and arterial and venous cord gases  COMPLICATIONS: none  FINDINGS: No intra-abdominal adhesions were noted. Grossly normal uterus, tubes and ovaries. clear amniotic fluid, cephalic (OP) female infant, weight 2770gm, APGARs 4/6/8, intact placenta. Results for [name], [name] Hailey (MRN [id number]) as of 03/24/2017 13:09  Ref. Range 03/21/2017 10:30 03/21/2017 10:30  pH cord blood (arterial) Latest Ref Range: 7.210 - 7.380  7.270   pCO2 cord blood (arterial) Latest Ref Range: 42.0 - 56.0 mmHg 51.0   Bicarbonate Latest Ref Range: 13.0 - 22.0 mmol/L 22.6 (H) 21.0  Ph Cord Blood (Venous) Latest Ref Range: 7.240 - 7.380   7.350  pCO2 Cord Blood (Venous) Latest Ref Range: 42.0 - 56.0   39.0 (L)    PROCEDURE IN DETAIL: The patient was taken to the operating room where anesthesia was administered and normal fetal heart tones were confirmed. She was then prepped and draped in the normal fashion in the dorsal supine position with a leftward tilt.  After a  time out was performed, a pfannensteil skin incision was made with the scalpel and carried through to the underlying layer of fascia. The fascia was then incised at the midline and this incision was extended laterally with the mayo scissors. Attention was turned to the superior aspect of the fascial incision which was grasped with the kocher clamps x 2, tented up and the rectus muscles were dissected off with the bovie. In a similar fashion the inferior aspect of the fascial incision was grasped with the kocher clamps, tented up and the rectus muscles dissected off with the mayo scissors. The rectus muscles were then separated in the midline and the peritoneum was entered bluntly. The bladder blade was inserted and the vesicouterine peritoneum was identified, tented up and entered with the metzenbaum scissors. This incision was extended laterally and the bladder flap was created digitally. The bladder blade was reinserted.  A low transverse hysterotomy was made with the scalpel until the endometrial cavity was breached and the amniotic sac ruptured, yielding scant, clear amniotic fluid. This incision was extended bluntly and the infant's head, shoulders and body were delivered atraumatically.The cord was clamped x 2 and cut, and the infant was handed to the awaiting pediatricians, after delayed cord clamping was not done.  The placenta was then gradually expressed from the uterus and then the uterus was exteriorized and cleared of all clots and debris. The hysterotomy was repaired with a running suture of 1-0 monocryl. A second imbricating layer of 1-0 monocryl suture was then placed to achieve excellent hemostasis.   The uterus and adnexa were then returned to the abdomen,  and the hysterotomy and all operative sites were reinspected and excellent hemostasis was noted after irrigation and suction of the abdomen with warm saline.  The peritoneum was closed with a running stitch of 3-0 Vicryl. The fascia was  reapproximated with 0 Vicryl in a simple running fashion bilaterally. The subcutaneous layer was then reapproximated with interrupted sutures of 2-0 plain gut, and the skin was then closed with 4-0 monocryl, in a subcuticular fashion.  The patient  tolerated the procedure well. Sponge, lap, needle, and instrument counts were correct x 2. The patient was transferred to the recovery room awake, alert and breathing independently in stable condition.  Cornelia Copaharlie Aidin Doane, Jr. MD Attending Center for [facility](Faculty Practice)

## 2017-03-21 NOTE — Progress Notes (Signed)
Vitals:   03/20/17 2301 03/20/17 2331  BP: 120/77 109/66  Pulse: 86 93  Resp: 16   Temp: 98.2 F (36.8 C)   SpO2:     Dr Alysia PennaErvin in to see pt.  Cx still 8, vtx -3.  Tried pushing to see if head would come down, it goes back up. Ctx spaced out q 5-10 minutes. Still having varaibles, but EFM looks great in b/t ctx.  Will increase pitocin to get better pattern, try pushing when tht happens

## 2017-03-21 NOTE — Transfer of Care (Signed)
Immediate Anesthesia Transfer of Care Note  Patient: [name]  Procedure(s) Performed: CESAREAN SECTION (N/A Abdomen)  Patient Location: PACU  Anesthesia Type:Epidural  Level of Consciousness: awake and alert   Airway & Oxygen Therapy: Patient Spontanous Breathing  Post-op Assessment: Report given to RN and Post -op Vital signs reviewed and stable  Post vital signs: Reviewed  Last Vitals:  Vitals:   03/21/17 0830 03/21/17 0900  BP: (!) 118/40 118/72  Pulse: (!) 107 (!) 102  Resp: 16 16  Temp:    SpO2:      Last Pain:  Vitals:   03/21/17 0805  TempSrc:   PainSc: 0-No pain         Complications: No apparent anesthesia complications

## 2017-03-21 NOTE — Progress Notes (Signed)
Pt comfortable, still itching from epidural.  cx 8/80/-3, but vtx is better applied to cx, not as floppy.  FHR with some variable decels still, min to moderate variability. + scalp stim. Rare ctx. Will restart pitocin.

## 2017-03-21 NOTE — Progress Notes (Signed)
Informed MOB that baby has been transferred to the care of mother/baby and that usually baby stays in the room with mom at this point.  She said that she is too tired to care for the baby at this point, and her support person is also tired and wants to go home.  Baby is continuing to stay in the Nursery at this point.

## 2017-03-21 NOTE — Progress Notes (Signed)
Bair Hugger off.  Pt continues to c/o being cold.

## 2017-03-21 NOTE — Progress Notes (Signed)
POC for C/S initiated. 

## 2017-03-21 NOTE — Progress Notes (Signed)
Vitals:   03/21/17 0201 03/21/17 0229  BP: 138/86 128/84  Pulse: 90 93  Resp:    Temp:    SpO2:     Ctx now q 8-10  Minutes.  Have tried position changes (high fowler's, peanut, etc), vtx still at -3 and cx still 8/70.  FHR baseline 120's to 150's, minimal to avg variability, + accels, + variable decels w/ctx.  Dr. Alysia PennaERvin given report on all of the above.  WIll try a Pitocin break.

## 2017-03-21 NOTE — Progress Notes (Addendum)
L&D Note  03/21/2017 - 9:02 AM  36 y.o. [id number] 448w4d. Pregnancy complicated by AMA, di-di twins, hsv, gbs pos, cHTN, [place] trait, h/o STIs, h/o substance abuse  Patient Active Problem List   Diagnosis Date Noted  . Back pain affecting pregnancy in third trimester 03/20/2017  . Labor, premature with delivery 03/20/2017  . Preterm labor in third trimester 03/03/2017  . Chronic hypertension in pregnancy 02/21/2017  . GERD (gastroesophageal reflux disease) 02/21/2017  . Umbilical hernia 01/13/2017  . Hereditary disease in family possibly affecting fetus, fetus 1   . Penicillin allergy 10/02/2016  . Opiate use 09/25/2016  . Supervision of high risk pregnancy, antepartum 09/19/2016  . History of herpes genitalis 09/19/2016  . History of trichomoniasis 09/19/2016  . Advanced maternal age in multigravida, second trimester 09/19/2016  . Grand multiparity 09/19/2016  . Short interval between pregnancies affecting pregnancy in first trimester, antepartum 09/19/2016  . Dichorionic diamniotic twin gestation 09/19/2016  . Chronic hypertension 07/03/2016  . Tobacco abuse 12/19/2014  . Bipolar disorder (HCC) 12/19/2014  . History of substance abuse 12/19/2014  . Sickle cell trait (HCC) 12/19/2014    Ms. Bernise S Haywood is admitted for PTL   Subjective:  Comfortable with epidural in place  Objective:   Vitals:   03/21/17 0701 03/21/17 0730 03/21/17 0800 03/21/17 0830  BP: 109/64 126/76 118/67 (!) 118/40  Pulse: 95 91 (!) 104 (!) 107  Resp: 16 16 16 16   Temp:  99 F (37.2 C)    TempSrc:  Oral    SpO2:        Current Vital Signs 24h Vital Sign Ranges  T 99 F (37.2 C) Temp  Avg: 98.6 F (37 C)  Min: 97.6 F (36.4 C)  Max: 99 F (37.2 C)  BP (!) 118/40 BP  Min: 92/64  Max: 153/93  HR (!) 107 Pulse  Avg: 96.7  Min: 83  Max: 110  RR 16 Resp  Avg: 15.9  Min: 14  Max: 17  SaO2 100 % Not Delivered SpO2  Avg: 99.6 %  Min: 98 %  Max: 100 %       24 Hour I/O Current Shift I/O   Time Ins Outs 12/13 0701 - 12/14 0700 In: -  Out: 1300 [Urine:1300] 12/14 0701 - 12/14 1900 In: -  Out: 500 [Urine:500]   FHR: twin b with 130 baseline, +accels and scalp stim just now, occasional variable decels with good recovery, mod variability Toco: [id number] Gen: NAD SVE: 6-7/70/-1 +scalp stim  Labs:  Recent Labs  Lab 03/14/17 1056 03/20/17 1448  WBC 8.5 10.2  HGB 9.5* 11.9*  HCT 30.0* 38.0  PLT 188 163   Recent Labs  Lab 03/14/17 1056  NA 133*  K 3.8  CL 105  CO2 19*  BUN 8  CREATININE 0.49  CALCIUM 8.8*  PROT 5.8*  BILITOT 0.7  ALKPHOS 152*  ALT 14  AST 21  GLUCOSE 105*    Medications Current Facility-Administered Medications  Medication Dose Route Frequency Provider Last Rate Last Dose  . acetaminophen (TYLENOL) tablet 650 mg  650 mg Oral Q4H PRN Marthenia RollingBland, Scott, DO      . diphenhydrAMINE (BENADRYL) injection 12.5 mg  12.5 mg Intravenous Q15 min PRN Cecile Hearingurk, Stephen Edward, MD   12.5 mg at 03/20/17 2102  . ePHEDrine injection 10 mg  10 mg Intravenous PRN Cecile Hearingurk, Stephen Edward, MD      . ePHEDrine injection 10 mg  10 mg Intravenous PRN Cecile Hearingurk, Stephen Edward, MD      .  fentaNYL (SUBLIMAZE) injection 100 mcg  100 mcg Intravenous Q1H PRN Marthenia RollingBland, Scott, DO   100 mcg at 03/20/17 1456  . fentaNYL 2.5 mcg/ml w/bupivacaine 0.1% in NS 100ml epidural infusion (WH-ANES)  14 mL/hr Epidural Continuous PRN Cecile Hearingurk, Stephen Edward, MD 14 mL/hr at 03/21/17 0805 14 mL/hr at 03/21/17 0805  . lactated ringers infusion 500 mL  500 mL Intravenous Once Cecile Hearingurk, Stephen Edward, MD      . lactated ringers infusion 500 mL  500 mL Intravenous Once Cecile Hearingurk, Stephen Edward, MD      . lactated ringers infusion 500-1,000 mL  500-1,000 mL Intravenous PRN Marthenia RollingBland, Scott, DO 1,000 mL/hr at 03/21/17 0437 500 mL at 03/21/17 0437  . lactated ringers infusion   Intravenous Continuous Marthenia RollingBland, Scott, DO 125 mL/hr at 03/20/17 1844    . lactated ringers infusion   Intrauterine Continuous Jacklyn ShellCresenzo-Dishmon, Frances, CNM  100 mL/hr at 03/20/17 2147    . lidocaine (PF) (XYLOCAINE) 1 % injection 30 mL  30 mL Subcutaneous PRN Marthenia RollingBland, Scott, DO      . ondansetron (ZOFRAN) injection 4 mg  4 mg Intravenous Q6H PRN Marthenia RollingBland, Scott, DO   4 mg at 03/20/17 1531  . oxyCODONE-acetaminophen (PERCOCET/ROXICET) 5-325 MG per tablet 1 tablet  1 tablet Oral Q4H PRN Marthenia RollingBland, Scott, DO      . oxyCODONE-acetaminophen (PERCOCET/ROXICET) 5-325 MG per tablet 2 tablet  2 tablet Oral Q4H PRN Marthenia RollingBland, Scott, DO      . oxytocin (PITOCIN) IV BOLUS FROM BAG  500 mL Intravenous Once Marthenia RollingBland, Scott, DO      . oxytocin (PITOCIN) IV infusion 40 units in LR 1000 mL - Premix  2.5 Units/hr Intravenous Continuous Bland, Scott, DO      . oxytocin (PITOCIN) IV infusion 40 units in LR 1000 mL - Premix  1-40 milli-units/min Intravenous Titrated Aviva SignsWilliams, Marie L, CNM 27 mL/hr at 03/21/17 0830 18 milli-units/min at 03/21/17 0830  . PHENYLephrine 40 mcg/ml in normal saline Adult IV Push Syringe  80 mcg Intravenous PRN Cecile Hearingurk, Stephen Edward, MD      . PHENYLephrine 40 mcg/ml in normal saline Adult IV Push Syringe  80 mcg Intravenous PRN Cecile Hearingurk, Stephen Edward, MD      . sodium citrate-citric acid (ORACIT) solution 30 mL  30 mL Oral Q2H PRN Marthenia RollingBland, Scott, DO      . sodium phosphate (FLEET) 7-19 GM/118ML enema 1 enema  1 enema Rectal PRN Marthenia RollingBland, Scott, DO      . terbutaline (BRETHINE) injection 0.25 mg  0.25 mg Subcutaneous Once PRN Aviva SignsWilliams, Marie L, CNM      . vancomycin (VANCOCIN) IVPB 1000 mg/200 mL premix  1,000 mg Intravenous Q12H Bland, Scott, DO 200 mL/hr at 03/21/17 0350 1,000 mg at 03/21/17 0350    Assessment & Plan:  Arrest of dilation *IUP: category II but overall fetal status reassuring *PTL: d/w pt that recommend c-section for arrest of dilation, as she has not changed her cervix since delivery of twin A at 1744 on 12/13 and has been on pitocin since that delivery except for a brief pitocin break from about 0230 to 0630 today. Pt amenable to plan. D/c vanc and  do clinda and gent and azithro. S/p bmz in November. D/w the OR. Pt does not want a BTL. *GBS: s/p vanc doses *cHTN: no issues.  *Analgesia: epidural working well  Cornelia Copaharlie Adylee Leonardo, Jr. MD Attending Center for [facility](Faculty Practice)

## 2017-03-21 NOTE — Addendum Note (Signed)
Addendum  created 03/21/17 1742 by Nastassja Witkop G, CRNA   Sign clinical note    

## 2017-03-21 NOTE — Progress Notes (Addendum)
Spoke with Nurse Midwife Hilda LiasMarie, informed her of elevated blood pressure.  She says to see what it is on her next 4 hour check. she thinks it is due to her being in the bed for two days and then standing up caused it to elevate.  Also told her that patient is requesting to have foley removed.  She said it is ok to remove it early, and if she doesn't void within 6 hours to cath her again.

## 2017-03-22 LAB — CBC
HEMATOCRIT: 33.9 % — AB (ref 36.0–46.0)
HEMOGLOBIN: 10.8 g/dL — AB (ref 12.0–15.0)
MCH: 27.7 pg (ref 26.0–34.0)
MCHC: 31.9 g/dL (ref 30.0–36.0)
MCV: 86.9 fL (ref 78.0–100.0)
Platelets: 128 10*3/uL — ABNORMAL LOW (ref 150–400)
RBC: 3.9 MIL/uL (ref 3.87–5.11)
RDW: 21.8 % — ABNORMAL HIGH (ref 11.5–15.5)
WBC: 13.1 10*3/uL — ABNORMAL HIGH (ref 4.0–10.5)

## 2017-03-22 NOTE — Progress Notes (Signed)
POSTPARTUM PROGRESS NOTE  Post Partum Day 2 Subjective:  [name] is a 36 y.o. [id number] 5032w5d s/p SVD for babyA and LTCS for babyb.  No acute events overnight.  Pt has not been ambulating.  Has no concern with voiding or po intake.  She denies nausea or vomiting.  Pain is moderately controlled.  She has had flatus. She has not had bowel movement.  Lochia Small.   Objective: Blood pressure 131/87, pulse 81, temperature 98.6 F (37 C), temperature source Oral, resp. rate 18, last menstrual period 07/08/2016, SpO2 98 %, not currently breastfeeding.  Physical Exam:  General: alert, cooperative and no distress Lochia:normal flow Chest: no respiratory distress Heart:regular rate, distal pulses intact Abdomen: soft, nontender,  Uterine Fundus: firm, appropriately tender DVT Evaluation: No calf swelling or tenderness Extremities: no edema  Recent Labs    03/20/17 1448 03/22/17 0521  HGB 11.9* 10.8*  HCT 38.0 33.9*    Assessment/Plan:  ASSESSMENT: [name] is a 36 y.o. [id number] 532w5d s/p SVD for baby A, LTCS for babyB  Plan for discharge tomorrow   LOS: 2 days   Scott BlandDO 03/22/2017, 9:03 AM   OB FELLOW POSTPARTUM PROGRESS NOTE ATTESTATION  I have seen and examined this patient and agree with above documentation in the resident's note.   Frederik PearJulie P Degele, MD OB Fellow

## 2017-03-22 NOTE — Progress Notes (Signed)
Pt was given Oxycodone 5mg  at 1558 for pain a 10 out of 10.  Called out at 1643 requesting another dose because she was still having severe pain at a 10.  Was given Oxycodone 10mg  at earlier in the day at 1046 and stated that dose worked better for her.  Given another 5mg  dose at 1647, pt was in the room laying in the bed comfortably talking and playing on her phone.  Will continue to assess.

## 2017-03-22 NOTE — Clinical Social Work Maternal (Signed)
CLINICAL SOCIAL WORK MATERNAL/CHILD NOTE  Patient Details  Name: [name] MRN: [id number] Date of Birth: 11/21/1980  Date:  03-26-17  Clinical Social Worker Initiating Note:  Laurey Arrow Date/Time: Initiated:  03/22/17/1310     Child's Name:  [name] and [name] Parents:  Mother([name] refused to provided CSW information about FOB. )   Need for Interpreter:  None   Reason for Referral:  Behavioral Health Concerns, Current Substance Use/Substance Use During Pregnancy    Address:  [address] [place] [address]    Phone number:  [phone] (home)     Additional phone number:   Household Members/Support Persons (HM/SP):   Household Member/Support Person 1, Household Member/Support Person 2   HM/SP Name Relationship DOB or Age  HM/SP -1 [name] [name]'s Mother unknown  HM/SP -2 [name] son 04/08/2016  HM/SP -3        HM/SP -4        HM/SP -5        HM/SP -6        HM/SP -7        HM/SP -8          Natural Supports (not living in the home):  Friends, Immediate Family   Professional Supports: Case Manager/Social Worker, Therapist([name] has a therapist [name]) with [facility].  [name] also has a OBCM Public affairs consultant) and a Office manager [name]).)   Employment: Unemployed   Type of Work:     Education:  9 to 11 years   Homebound arranged: No  Financial Resources:  [employer]   Other Resources:  Physicist, medical , [employer]   Cultural/Religious Considerations Which May Impact Care:  Per [name] Presenter, broadcasting, [name] is [name].   Strengths:  Ability to meet basic needs , Home prepared for child , Pediatrician chosen, Understanding of illness   Psychotropic Medications:         Pediatrician:       Pediatrician List:   [facility] ([address])  [place]      Pediatrician Fax Number:     Risk Factors/Current Problems:  DHHS Involvement , Mental Health Concerns , Substance Use    Cognitive State:  Alert , Able to Concentrate , Linear Thinking    Mood/Affect:  Bright , Happy , Comfortable , Relaxed , Interested    CSW Assessment: CSW met with [name] to complete an assessment for MH hx, CPS hx, and SA hx. When CSW arrived [name] was attaching and bonding with infant as evident by engaging in skin to skin. [name] introduced her room guest as [name]'s mother [name]) and [name]'s Brother [name]); BJ was wearing the hospital's Support Band and was adamant that he was not the twins father. With [name]'s permission, CSW asked [name]'s guest to leave the room in effort to meet with [name] in private. [name] was polite, forthcoming, easy to engage, and receptive to meeting with CSW.   CSW asked about [name]'s thoughts and feeling regarding having twins and having one twin admitted into the NICU. It was evident by [name]'s facial expression, [name] was excited and smiled as she shared her twins name.  [name] communicated an understanding about why Twin B was admitted to the NICU.   [name] expressed being pleased with his medical progress.  [name] reported having all basic  necessary items for twins and feeling prepared to parent.  [name] disclosed that [name] is actively working with an OBCM ([name]), [facility] Case Worker ([name]), and [facility] therapist ([name]).  CSW praised [name] for being involved with community resources and encouraged her to continue to participate.   CSW inquired about [name]'s substance use hx, and [name] acknowledged her hx of substance use and reported that she has not used any substance in over 5 years; CSW praised [name] for her sobriety.  CSW informed [name] of the hospital's policy relating substance use and encouraged [name] to ask questions.  CSW thanked [name] for being open and honest about her substance hx. CSW informed [name] of the two screenings for the twins. CSW informed [name] that the twins  had a negative UDS and communicated that CSW would continue to monitor the twins CDS. [name] was made aware that CSW would contact [name], if the twins CDS are positive without an explanation.  CSW also informed [name] that CSW was making a report to [name] CPS due to [name]'s MH hx and CPS hx ([name] does not have custody of [name]'s older 7 children; CPS involvement and [name] was not allowed to d/c with baby #8 in January 2018; [name] had to have  24/7 supervision from a relative). [name] was understanding and shared with CSW that [name]'s CPS case is currently closed and [name] has full custody of Baby #8.  [name] stated, "No, I don't have my other kids because it is going to cost me to much money to take my case to Superior Court.  It's going to cost me $5,000.00 and I don't have that right now."   CSW asked about [name]'s MH hx.  [name] shared that [name] had a MH assessment in February 2018, and several of [name]'s past dx was ruled out.  [name] was not a good historian about her MH and could not specifically tell CSW her current diagnosis. [name] denied being on a medication regiment and communicated her next appointment with therapist is in January 2019. CSW provided education regarding the baby blues period vs. perinatal mood disorders, discussed treatment and gave resources for mental health follow up if concerns arise.  CSW recommends self-evaluation during the postpartum time period using the New Mom Checklist from Postpartum Progress and encouraged [name] to contact a medical professional if symptoms are noted at any time. [name] denied PPD with [name]'s older children.  [name] did not present with any acute signs and symptoms. CSW assessed for safety and [name] denied SI, HI and DV.    CSW provided review of Sudden Infant Death Syndrome (SIDS) precautions.    CSW made a report with CPS worker [name].  At this time, there are barriers to d/c until CSW is informed of safety d/c plan from CPS.  CSW Plan/Description:  Psychosocial Support and  Ongoing Assessment of Needs, Perinatal Mood and Anxiety Disorder (PMADs) Education, Other Patient/Family Education, Hospital Drug Screen Policy Information, CSW Will Continue to Monitor Umbilical Cord Tissue Drug Screen Results and Make Report if Warranted, Sudden Infant Death Syndrome (SIDS) Education, Other Information/Referral to Community Resources   Sabiha Sura Boyd-Gilyard, MSW, LCSW Clinical Social Work [phone]  Paige Vanderwoude D BOYD-GILYARD, LCSW 03/22/2017, 2:02 PM 

## 2017-03-23 MED ORDER — MEDROXYPROGESTERONE ACETATE 150 MG/ML IM SUSP
150.0000 mg | Freq: Once | INTRAMUSCULAR | Status: AC
  Administered 2017-03-24: 150 mg via INTRAMUSCULAR
  Filled 2017-03-23: qty 1

## 2017-03-23 MED ORDER — DEXTROSE 5 % IV SOLN
5.0000 mg/kg | INTRAVENOUS | Status: DC
  Administered 2017-03-23: 380 mg via INTRAVENOUS
  Filled 2017-03-23 (×2): qty 9.5

## 2017-03-23 MED ORDER — CLINDAMYCIN PHOSPHATE 900 MG/50ML IV SOLN
900.0000 mg | Freq: Three times a day (TID) | INTRAVENOUS | Status: DC
  Administered 2017-03-23 – 2017-03-24 (×3): 900 mg via INTRAVENOUS
  Filled 2017-03-23 (×4): qty 50

## 2017-03-23 NOTE — Progress Notes (Signed)
Pt called out and requested pain medicine and when this nurse went in the room pt was asleep.  She woke up and said her pain was a 10 and that she still needed pain medicine.  This nurse reassessed pain one hour later and pt was asleep.

## 2017-03-23 NOTE — Progress Notes (Signed)
POSTPARTUM PROGRESS NOTE  PPD# 3 SVD POD# 2 pLTCS  Subjective:  [name] is a 36 y.o. [id number] 8573w6d s/p SVD of twin A and pLTCS of twin B. Patient complains of very severe abdominal pain that is around her abdomen and incision site. She denies sweats or chills.   Objective: Blood pressure 132/82, pulse 67, temperature 99.4 F (37.4 C), temperature source Oral, resp. rate 18, weight 167 lb (75.8 kg), last menstrual period 07/08/2016, SpO2 100 %, not currently breastfeeding.  Physical Exam:  General: alert, cooperative and no distress Lochia:normal flow Chest: no respiratory distress Heart:regular rate, distal pulses intact Abdomen: soft. Fundus very tender to palpation/light touch. Incision is clean and dry without excessive bleeding Uterine Fundus: firm, appropriately tender DVT Evaluation: No calf swelling or tenderness Extremities: no edema  Recent Labs    03/20/17 1448 03/22/17 0521  HGB 11.9* 10.8*  HCT 38.0 33.9*    Assessment/Plan:  ASSESSMENT: [name] is a 36 y.o. [id number] 7873w6d s/p SVD and pLTCS. Patient is not yet febrile but with fundus tender to palpation will start abx for endometritis.   LOS: 3 days   [name] MossDO 03/23/2017, 7:09 AM

## 2017-03-23 NOTE — Progress Notes (Signed)
Zerita Boersarlene Lawson CNM notified of notation from Pediatricians admission assessment that a worm was found on baby after delivery. It was noted that the worm was sent to pathology.  Wanted to make sure OB was aware of this finding.

## 2017-03-24 MED ORDER — IBUPROFEN 600 MG PO TABS: 600.0000 mg | ORAL_TABLET | Freq: Four times a day (QID) | ORAL | 0 refills | Status: DC

## 2017-03-24 MED ORDER — OXYCODONE-ACETAMINOPHEN 5-325 MG PO TABS: 1.0000 | ORAL_TABLET | Freq: Four times a day (QID) | ORAL | 0 refills | Status: DC | PRN

## 2017-03-24 MED ORDER — OXYCODONE HCL 5 MG PO TABS: 5.0000 mg | ORAL_TABLET | ORAL | 0 refills | Status: DC | PRN

## 2017-03-24 NOTE — Progress Notes (Signed)
CSW spoke with CPS worker Pam Miller.  CPS informed CSW that case was not accepted by CPS and there are no barriers to twins d/c to [facility].  CSW expressed concerns regarding custody of MOB's older children and MOB's MH hx. CPS again communicated that CSW report will not be accepted by CPS.  There are no barriers to d/c for twins to MOB.  [name], MSW, LCSW Clinical Social Work [phone] 

## 2017-03-24 NOTE — Discharge Summary (Signed)
OB Discharge Summary     Patient Name: [name] DOB: [birth year] MRN: [id number]  Date of admission: 03/20/2017 Delivering MD:    Adolphus BirchwoodHilliard, Girl Merrily [[id number]][[id number]]  Paulino RilyWILLIAMS, MARIE L   Schildt, BoyB Nickol [[id number]][[id number]]  Groveton BingPICKENS, CHARLIE   Date of discharge: 03/24/2017  Admitting diagnosis: CTX Intrauterine pregnancy: 823w0d, didi twin   Secondary diagnosis:  Active Problems:   Labor, premature with delivery  Additional problems: GBS pos w/ pcn allergy pLTCS for arrest of dilation for baby B Hx of custody loss for prior children Chronic HTN AMA 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 yo [id number] at 4023w0d was admitted to the hospital 03/20/2017 for induction of labor. Indication for induction: Favorable cervix at term.  Patient had an uncomplicated SVD for baby A but required a pLTCS for baby B for arrest of dilation >12hrs  Membrane Rupture Time/Date:    Adolphus BirchwoodHilliard, Girl Eretria [[id number]][[id number]]  4:30 PM   Larae GroomsHilliard, BoyB Aivy [[id number]][[id number]]  4:30 PM ,   Adolphus BirchwoodHilliard, Girl Lorenza Evangelistssey [[id number]][[id number]]  03/20/2017   Larae GroomsHilliard, BoyB Kaylon [[id number]][[id number]]  03/20/2017   Intrapartum Procedures: Episiotomy:    Adolphus BirchwoodHilliard, Girl Emillie [[id number]][[id number]]  None [1]   Larae GroomsHilliard, BoyB Aleesha [[id number]][[id number]]  None [1]                                         Lacerations:     Adolphus BirchwoodHilliard, Girl Lazette [[id number]][[id number]]  None [1]   Larae GroomsHilliard, BoyB Zynasia [[id number]][[id number]]  None [1]  Patient had delivery of a Viable infant.  Information for the patient's newborn:  Adolphus BirchwoodHilliard, Girl Lorenza Evangelistssey [[id number]][[id number]]  Delivery Method: Vaginal, Spontaneous(Filed from Delivery  Summary) Information for the patient's newborn:  Larae GroomsHilliard, BoyB Neoma [[id number]][[id number]]        Adolphus BirchwoodHilliard, Girl Lorenza Evangelistssey [[id number]][[id number]]  03/20/2017   Larae GroomsHilliard, BoyB Bennetta [[id number]][[id number]]  03/21/2017  Details of delivery can be found in separate delivery note.  Patient had a routine postpartum course with only exception being endometritis treated w/ abx. Patient is discharged home 03/24/17.  Physical exam  Vitals:   03/23/17 0510 03/23/17 1910 03/24/17 0537 03/24/17 0642  BP: 132/82 130/86 136/83   Pulse: 67 70 76   Resp: 18 18 20    Temp: 99.4 F (37.4 C) 98.2 F (36.8 C) 98.2 F (36.8 C)   TempSrc: Oral Oral Oral   SpO2:      Weight:    167 lb 4.8 oz (75.9 kg)   General: alert, cooperative and no distress Lochia: appropriate Uterine Fundus: firm Incision: Healing well with no significant drainage, Dressing is clean, dry, and intact DVT Evaluation: No evidence  of DVT seen on physical exam. Labs: Lab Results  Component Value Date   WBC 13.1 (H) 03/22/2017   HGB 10.8 (L) 03/22/2017   HCT 33.9 (L) 03/22/2017   MCV 86.9 03/22/2017   PLT 128 (L) 03/22/2017   CMP Latest Ref Rng & Units 03/14/2017  Glucose 65 - 99 mg/dL 161(W)  BUN 6 - 20 mg/dL 8  Creatinine 9.60 - 4.54 mg/dL 0.98  Sodium 119 - 147 mmol/L 133(L)  Potassium 3.5 - 5.1 mmol/L 3.8  Chloride 101 - 111 mmol/L 105  CO2 22 - 32 mmol/L 19(L)  Calcium 8.9 - 10.3 mg/dL 8.2(N)  Total Protein 6.5 - 8.1 g/dL 5.6(O)  Total Bilirubin 0.3 - 1.2 mg/dL 0.7  Alkaline Phos 38 - 126 U/L 152(H)  AST 15 - 41 U/L 21  ALT 14 - 54 U/L 14    Discharge instruction: per After Visit Summary and "Baby and Me Booklet".  After visit meds:  Allergies as of 03/24/2017      Reactions   Peanut-containing Drug Products Anaphylaxis, Itching, Rash   Amoxicillin Hives, Swelling, Other (See Comments)   Has patient had a PCN reaction causing immediate rash, facial/tongue/throat swelling, SOB or lightheadedness with hypotension: No Has patient had a PCN  reaction causing severe rash involving mucus membranes or skin necrosis: No Has patient had a PCN reaction that required hospitalization No Has patient had a PCN reaction occurring within the last 10 years: Yes If all of the above answers are "NO", then may proceed with Cephalosporin use.   Latex Hives, Itching      Medication List    STOP taking these medications   aspirin EC 81 MG tablet   ferrous sulfate 325 (65 FE) MG tablet   traMADol 50 MG tablet Commonly known as:  ULTRAM   valACYclovir 1000 MG tablet Commonly known as:  VALTREX     TAKE these medications   docusate sodium 100 MG capsule Commonly known as:  COLACE Take 1 capsule (100 mg total) by mouth daily.   ibuprofen 600 MG tablet Commonly known as:  ADVIL,MOTRIN Take 1 tablet (600 mg total) by mouth every 6 (six) hours.   omeprazole 20 MG tablet Commonly known as:  PRILOSEC OTC Take 1 tablet (20 mg total) daily by mouth.   oxyCODONE-acetaminophen 5-325 MG tablet Commonly known as:  ROXICET Take 1-2 tablets by mouth every 6 (six) hours as needed.   PREPLUS 27-1 MG Tabs Take 1 tablet by mouth daily.       Diet: routine diet  Activity: Advance as tolerated. Pelvic rest for 6 weeks.   Outpatient follow up:2 weeks in clinic and baby love visit for BP check in 3 days Follow up Appt:No future appointments. Follow up Visit:No Follow-up on file.  Postpartum contraception: Depo Provera  Newborn Data:   Clarivel, [place] Girl Earlie [[id number]]  Live born female  Birth Weight: 5 lb 1.3 oz (2305 g) APGAR: 7, 8  Newborn Delivery   Birth date/time:  03/20/2017 17:44:00 Delivery type:  Vaginal, Spontaneous      Lyllian, Gause [[id number]]  Live born female  Birth Weight: 6 lb 1.7 oz (2770 g) APGAR: 4, 6  Newborn Delivery   Birth date/time:  03/21/2017 10:26:00 Delivery type:  C-Section, Low Transverse C-section categorization:  Primary     Baby Feeding: Bottle and Breast Disposition:home with  mother   Midwife attestation I have seen and examined this patient and agree with above documentation in the resident's note.   Annaliese  Durene CalS Baby is a 36 y.o. [id number] s/p SVD, pCS.   Pain is well controlled.  Plan for birth control is Depo-Provera.  Method of Feeding: breast and formula  PE:  Gen: well appearing Heart: reg rate Lungs: normal WOB Fundus firm Ext: soft, no pain, no edema  Recent Labs    03/22/17 0521  HGB 10.8*  HCT 33.9*     Assessment POD #3 s/p PCS PPD #4 s/p SVD CHTN  Plan: - discharge home (may room in with babies) - postpartum care discussed - f/u clinic in 6 weeks for postpartum visit - f/u with baby love visit in 3 days for BP check   Donette LarryMelanie Cinderella Christoffersen, CNM 10:22 AM  03/24/2017 Donette LarryMelanie Galileah Piggee, CNM

## 2017-03-31 ENCOUNTER — Inpatient Hospital Stay (HOSPITAL_COMMUNITY): Payer: Medicaid Other

## 2017-04-04 ENCOUNTER — Ambulatory Visit: Payer: Medicaid Other | Admitting: Obstetrics & Gynecology

## 2017-04-09 ENCOUNTER — Ambulatory Visit: Payer: Medicaid Other | Admitting: General Practice

## 2017-04-09 VITALS — BP 132/84 | HR 62 | Temp 99.0°F | Ht 63.0 in | Wt 151.0 lb

## 2017-04-09 DIAGNOSIS — Z5189 Encounter for other specified aftercare: Secondary | ICD-10-CM

## 2017-04-09 DIAGNOSIS — Z0130 Encounter for examination of blood pressure without abnormal findings: Secondary | ICD-10-CM

## 2017-04-09 NOTE — Progress Notes (Signed)
Patient here for BP & wound check today. Patient reports some blurry vision & dizziness, denies headaches. Incision clean, dry & intact. Steri strips were still present, so I removed them. Patient is extremely tender around incision and lower abdomen. Patient also has difficulty moving around. Discussed with Dr Vergie LivingPickens who recommends patient go to [facility] for evaluation. Discussed with patient. Patient verbalized understanding to all & had no questions

## 2017-04-21 ENCOUNTER — Encounter: Payer: Self-pay | Admitting: Obstetrics and Gynecology

## 2017-04-21 ENCOUNTER — Ambulatory Visit: Payer: Medicaid Other | Admitting: Obstetrics and Gynecology

## 2017-04-22 NOTE — Progress Notes (Signed)
Patient did not keep postpartum appointment for 04/21/2017.  [name], Jr MD Attending [facility](Faculty Practice)

## 2017-05-05 ENCOUNTER — Ambulatory Visit: Payer: Medicaid Other | Admitting: Family Medicine

## 2017-07-07 ENCOUNTER — Encounter (HOSPITAL_COMMUNITY): Payer: Self-pay

## 2017-08-18 ENCOUNTER — Ambulatory Visit (HOSPITAL_COMMUNITY)
Admission: EM | Admit: 2017-08-18 | Discharge: 2017-08-18 | Disposition: A | Discharge status: Nursing Home (Includes ICF) | Payer: Medicaid Other

## 2017-08-18 NOTE — ED Notes (Signed)
Pt had hernia surgery 5/10, pt c/o severe abdominal pain, discussed with Dr. Delton See, pt needs to contact surgeon for pain management, and Dr. Delton See, recommended patient go to the ER for evaluation. Pt verbalized understanding.

## 2017-08-20 ENCOUNTER — Emergency Department (HOSPITAL_COMMUNITY)
Admission: EM | Admit: 2017-08-20 | Discharge: 2017-08-20 | Discharge status: Against Medical Advice | Payer: Medicaid Other | Attending: Emergency Medicine | Admitting: Emergency Medicine

## 2017-08-20 ENCOUNTER — Other Ambulatory Visit: Payer: Self-pay

## 2017-08-20 ENCOUNTER — Encounter (HOSPITAL_COMMUNITY): Payer: Self-pay | Admitting: Emergency Medicine

## 2017-08-20 DIAGNOSIS — Z79899 Other long term (current) drug therapy: Secondary | ICD-10-CM | POA: Insufficient documentation

## 2017-08-20 DIAGNOSIS — I10 Essential (primary) hypertension: Secondary | ICD-10-CM | POA: Diagnosis not present

## 2017-08-20 DIAGNOSIS — T148XXA Other injury of unspecified body region, initial encounter: Secondary | ICD-10-CM

## 2017-08-20 DIAGNOSIS — G8918 Other acute postprocedural pain: Secondary | ICD-10-CM | POA: Diagnosis not present

## 2017-08-20 DIAGNOSIS — Z87891 Personal history of nicotine dependence: Secondary | ICD-10-CM | POA: Insufficient documentation

## 2017-08-20 DIAGNOSIS — L7622 Postprocedural hemorrhage and hematoma of skin and subcutaneous tissue following other procedure: Secondary | ICD-10-CM | POA: Insufficient documentation

## 2017-08-20 NOTE — ED Provider Notes (Signed)
[facility]-EMERGENCY DEPT Provider Note   CSN: [id number] Arrival date & time: 08/20/17  2127     History   Chief Complaint Chief Complaint  Patient presents with  . Post-op Problem    HPI [name] is a 37 y.o. female with history of bipolar 1 disorder, chronic abdominal pain, chronic nausea, deliberate self cutting, substance abuse, hypertension, schizophrenia, and sickle cell trait presents for evaluation of ongoing and progressively worsening abdominal pain as well as bleeding from surgical site which occurred 45 minutes prior to arrival.  Patient underwent umbilical hernia repair 5 days ago with Dr. Sheliah Hatch.  She states that she tolerated the procedure well but has had severe generalized abdominal pain which radiates to her back.  Pain is constant, worsens with any movement, coughing, position changes, or palpation.  Per [facility] controlled substance registry she was given 20 tablets of Norco 5/325 mg tablets postop but she states that she ran out of this medicine.  She tells me that initially she was taking this medicine "every hour ".  She denies associated nausea, vomiting, fevers, chills, constipation, melena, or hematochezia.  She does tell me that she has been experiencing urinary frequency but denies urgency, hematuria, or dysuria.  She has been on an antibiotic for the past 7 days for UTI but she is unsure what medicine she is taking.  She has been taking Motrin without relief of her symptoms.  Earlier this evening while at rest she noticed that her surgical incision was bleeding.  She notes a small amount of blood to her pajamas.  She denies any heavy lifting or exertion or sudden movements.  She states the bleeding has resolved at this time.   The history is provided by the patient.    Past Medical History:  Diagnosis Date  . Anemia   . Bipolar 1 disorder (HCC)   . Chlamydia   . Chronic abdominal pain   . Chronic nausea   .  Deliberate self-cutting   . History of substance abuse   . HSV-2 infection 2015  . Hypertension   . Infection    MRSA in 1995, negative since  . Schizophrenia (HCC)   . Seizures (HCC)    Not recently  . Sickle cell trait (HCC)   . Trichomonas infection     Patient Active Problem List   Diagnosis Date Noted  . Back pain affecting pregnancy in third trimester 03/20/2017  . Labor, premature with delivery 03/20/2017  . Preterm labor in third trimester 03/03/2017  . Chronic hypertension in pregnancy 02/21/2017  . GERD (gastroesophageal reflux disease) 02/21/2017  . Umbilical hernia 01/13/2017  . Hereditary disease in family possibly affecting fetus, fetus 1   . Penicillin allergy 10/02/2016  . Opiate use 09/25/2016  . Supervision of high risk pregnancy, antepartum 09/19/2016  . History of herpes genitalis 09/19/2016  . History of trichomoniasis 09/19/2016  . Advanced maternal age in multigravida, second trimester 09/19/2016  . Grand multiparity 09/19/2016  . Short interval between pregnancies affecting pregnancy in first trimester, antepartum 09/19/2016  . Dichorionic diamniotic twin gestation 09/19/2016  . Chronic hypertension 07/03/2016  . Tobacco abuse 12/19/2014  . Bipolar disorder (HCC) 12/19/2014  . History of substance abuse 12/19/2014  . Sickle cell trait (HCC) 12/19/2014    Past Surgical History:  Procedure Laterality Date  . CESAREAN SECTION N/A 03/21/2017   Procedure: CESAREAN SECTION;  Surgeon: Hampden-Sydney Bing, MD;  Location: [facility] BIRTHING SUITES;  Service: Obstetrics;  Laterality: N/A;  .  DILATION AND CURETTAGE OF UTERUS    . HEMORROIDECTOMY  2010  . plastic surgery on face       OB History    Gravida  15   Para  8   Term  8   Preterm  0   AB  6   Living  8     SAB  6   TAB  0   Ectopic  0   Multiple  0   Live Births  8            Home Medications    Prior to Admission medications   Medication Sig Start Date End Date Taking?  Authorizing Provider  ciprofloxacin (CIPRO) 500 MG tablet Take 500 mg by mouth 2 (two) times daily. 07/29/17  Yes [provider]  ibuprofen (ADVIL,MOTRIN) 200 MG tablet Take 800 mg by mouth every 6 (six) hours as needed for moderate pain.   Yes [provider]  Prenatal Vit-Fe Fumarate-FA (PREPLUS) 27-1 MG TABS Take 1 tablet by mouth daily. 11/04/16  Yes Hermina Staggers, MD  valACYclovir (VALTREX) 1000 MG tablet Take 1 tablet by mouth 2 (two) times daily. For 7 days 07/29/17  Yes [provider]  docusate sodium (COLACE) 100 MG capsule Take 1 capsule (100 mg total) by mouth daily. Patient not taking: Reported on 03/20/2017 03/16/17   Tilda Burrow, MD  ibuprofen (ADVIL,MOTRIN) 600 MG tablet Take 1 tablet (600 mg total) by mouth every 6 (six) hours. Patient not taking: Reported on 08/20/2017 03/24/17   Donette Larry, CNM  omeprazole (PRILOSEC OTC) 20 MG tablet Take 1 tablet (20 mg total) daily by mouth. Patient not taking: Reported on 08/20/2017 02/21/17   Reva Bores, MD  oxyCODONE-acetaminophen (ROXICET) 5-325 MG tablet Take 1-2 tablets by mouth every 6 (six) hours as needed. Patient not taking: Reported on 08/20/2017 03/24/17 03/24/18  Donette Larry, CNM    Family History Family History  Problem Relation Age of Onset  . Cancer Mother   . Heart failure Mother   . Hypertension Mother   . Stroke Mother   . Diabetes Mother   . Hypertension Maternal Grandmother   . Anesthesia problems Neg Hx     Social History Social History   Tobacco Use  . Smoking status: Former Smoker    Packs/day: 0.50    Years: 1.50    Pack years: 0.75    Types: Cigarettes    Last attempt to quit: 04/09/2015    Years since quitting: 2.3  . Smokeless tobacco: Never Used  Substance Use Topics  . Alcohol use: No    Comment: hx drug use  . Drug use: No    Types: Marijuana, Cocaine    Comment: past use, 4 years ago     Allergies   Peanut-containing drug products;  Amoxicillin; and Latex   Review of Systems Review of Systems  Constitutional: Negative for chills and fever.  Gastrointestinal: Positive for abdominal pain. Negative for blood in stool, constipation, nausea and vomiting.  Genitourinary: Positive for frequency. Negative for dysuria, hematuria and urgency.  Musculoskeletal: Positive for back pain.  Skin: Positive for wound.  All other systems reviewed and are negative.    Physical Exam Updated Vital Signs BP 132/88   Pulse 84   Temp 98.1 F (36.7 C) (Oral)   Resp 19   SpO2 98%   Physical Exam  Constitutional: She appears well-developed and well-nourished. No distress.  Resting in bed, talking on cell phone  HENT:  Head: Normocephalic and atraumatic.  Eyes: Conjunctivae are normal. Right eye exhibits no discharge. Left eye exhibits no discharge.  Neck: No JVD present. No tracheal deviation present.  Cardiovascular: Normal rate, regular rhythm and normal heart sounds.  Pulmonary/Chest: Effort normal and breath sounds normal.  Abdominal: Soft. She exhibits no distension.  Hypoactive bowel sounds.  Patient has diffuse tenderness to palpation with no focal tenderness.  She has a well-healing semilunar surgical incision just inferior to the umbilicus with dried surgical adhesive on top.  The incision is approximately 5 cm in length.  There are 2 areas within the incision where and dried blood is noted but no significant wound dehiscence is noted.  There is no surrounding erythema, induration, or abnormal drainage.  The patient does have right CVA tenderness on examination.  Musculoskeletal: She exhibits no edema.  Neurological: She is alert.  Skin: Skin is warm and dry. No erythema.  Psychiatric: She has a normal mood and affect. Her behavior is normal.  Nursing note and vitals reviewed.    ED Treatments / Results  Labs (all labs ordered are listed, but only abnormal results are displayed) Labs Reviewed - No data to  display  EKG None  Radiology No results found.  Procedures Procedures (including critical care time)  Medications Ordered in ED Medications - No data to display   Initial Impression / Assessment and Plan / ED Course  I have reviewed the triage vital signs and the nursing notes.  Pertinent labs & imaging results that were available during my care of the patient were reviewed by me and considered in my medical decision making (see chart for details).     Patient presents for evaluation of postoperative abdominal pain.  She is 5 days status post umbilical hernia repair.  She is afebrile, vital signs are stable.  She is nontoxic in appearance and is resting comfortably in bed talking on her cell phone on my initial evaluation.  There is a small amount of dried blood noted to her surgical incision but there is no definitive evidence of secondary skin infection or wound dehiscence on examination. Small droplets of dried blood on the patient's clothing but no active bleeding on examination.  She has diffuse tenderness to palpation of the abdomen with no focal tenderness.  She does have right CVA tenderness on examination and tells me that she is being treated for a UTI.  I do think it is worthwhile to obtain a UA and urine culture at this time and evaluate for or rule out pyelonephritis.  The patient states she is willing to give a urine sample at this time.  I did offer her nonnarcotic pain medicines and 1 tablet of hydrocodone in the ED to help ease her current symptoms.  10:14 PM I was informed by patient's RN that she refuses to give [place] a urine sample at this time.  I had an extensive discussion with the patient and she tells me "you guys do not want to do anything for my pain.  I could be dying over here and nobody would care ".  I reiterated to the patient that I not only offered her numerous forms of nonnarcotic pain medicines tonight, I also did offer her a tablet of Norco.  She tells me  "you can't send me home with anything?  You are talking about the laws cracking down, I could be dying right now ".  I explained to the patient that due to her recent procedure, the most  appropriate practitioner to manage her pain would be her surgeon. Patient demanded that I call Dr. Sheliah Hatch tonight and inform him that she needs pain medicine. I informed her that she would be better suited calling him during business hours first thing tomorrow morning, and assured her that I would do all that I could to keep fer comfortable tonight. The patient became agitated and walked out of the room. I did discuss the risk of leaving prior to full workup.  I attempted to encourage the patient to stay for evaluation of possible pyelonephritis or other acute intra-abdominal pathology which could potentially require hospitalization, but she states she does not care. Discussion witnessed by RN Beverly Gust.  Patient appears to have capacity to make this decision at this time.  She states she understands that she is currently leaving AGAINST MEDICAL ADVICE.  Encourage the patient to return if any worsening signs or symptoms develop.  Final Clinical Impressions(s) / ED Diagnoses   Final diagnoses:  Post-operative pain  Bleeding from wound    ED Discharge Orders    None       Jeanie Sewer, PA-C 08/21/17 0123    Little, Ambrose Finland, MD 08/21/17 [phone]

## 2017-08-20 NOTE — ED Notes (Addendum)
Upon nurse tech entering room to get urine sample from patient, patient refused to give sample. Patient stating that we do not care about her because we will not treat pain. This Clinical research associate and EDPA explained to patient laws about prescribing medications and that we could treat her pain here but not write a continuing prescription. Patient upset that we cannot writer her a prescription. Patient stated that her ride was already on the way to get her and she would be leaving. Mina EDPA at bedside with this writer attempting to redirect patient but unable. Patient refusing to sign AMA form. Patient left department.

## 2017-08-20 NOTE — ED Triage Notes (Signed)
Pt BIB EMS from home with complaints of post-op complications. Patient is complaining of pain at the incision site, bleeding and some swelling noted by EMS. No redness or warmth noted. Patient seen at urgent care yesterday for same and they sent her home. Patient is out of pain medication.

## 2017-08-20 NOTE — ED Notes (Signed)
Bed: WA07 Expected date:  Expected time:  Means of arrival:  Comments: 37 yo F  Post op problem

## 2017-09-21 IMAGING — US US MFM OB LIMITED
1 series · 15 of 15 positions shown · non-contrast
Comparison: none

[Series 1: us mfm ob limited · 15 acquisitions, 15 frames shown]
[im 1/15]
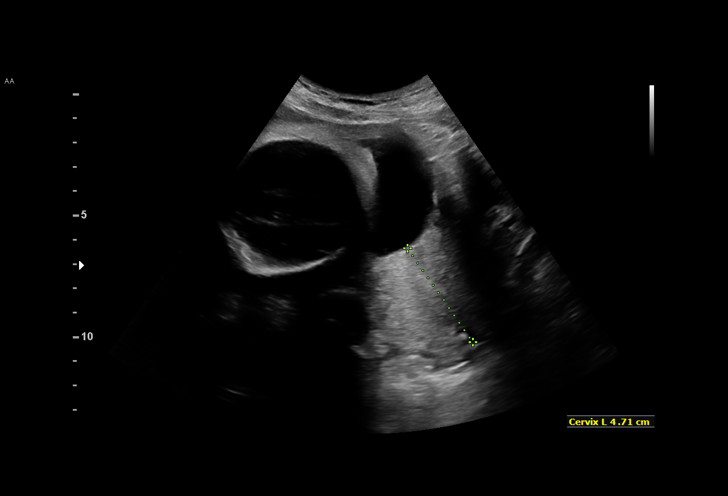
[im 2/15]
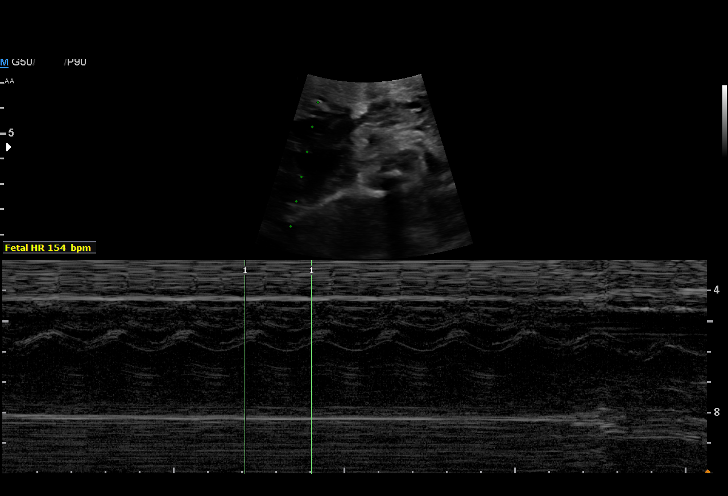
[im 3/15]
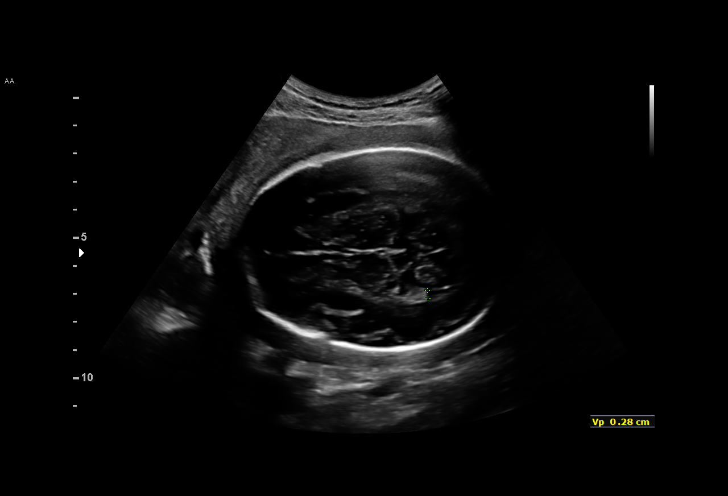
[im 4/15]
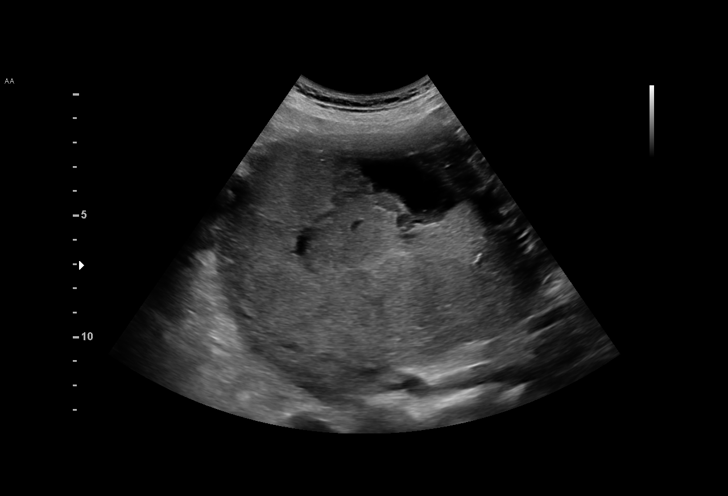
[im 5/15]
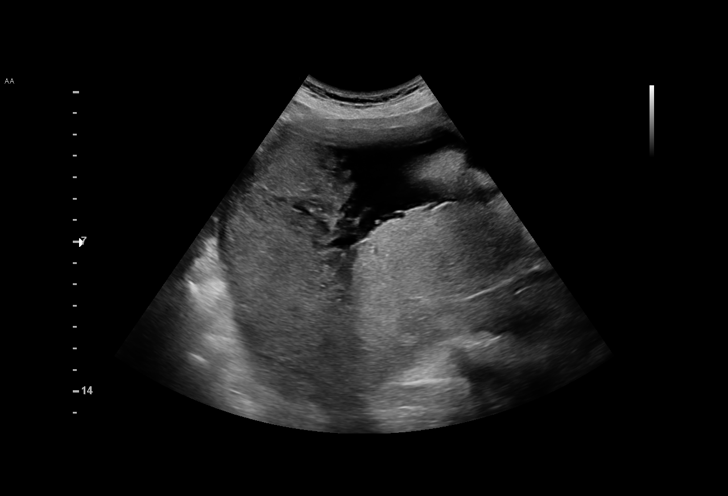
[im 6/15]
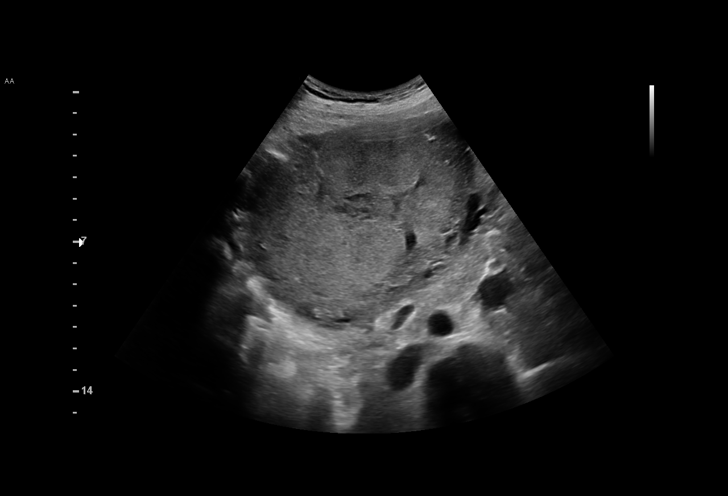
[im 7/15]
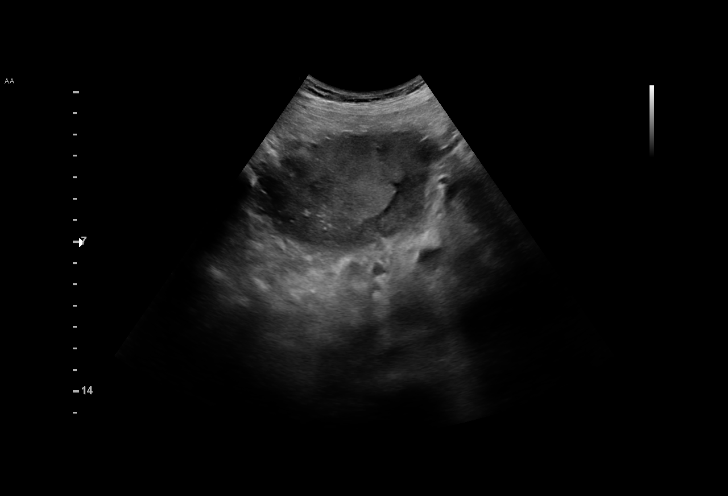
[im 8/15]
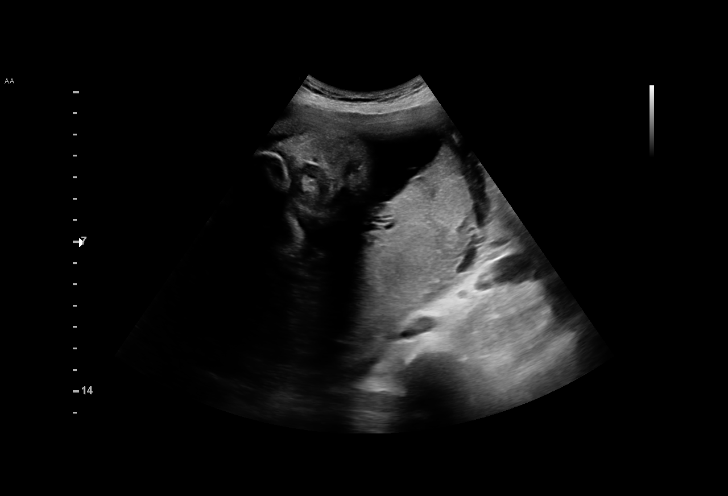
[im 9/15]
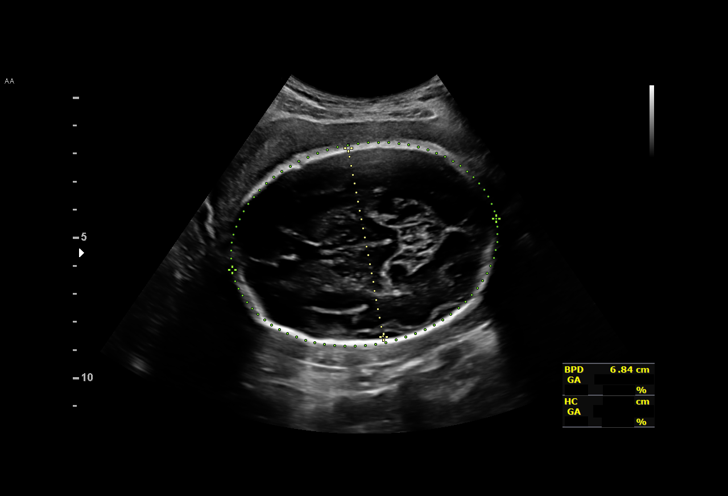
[im 10/15]
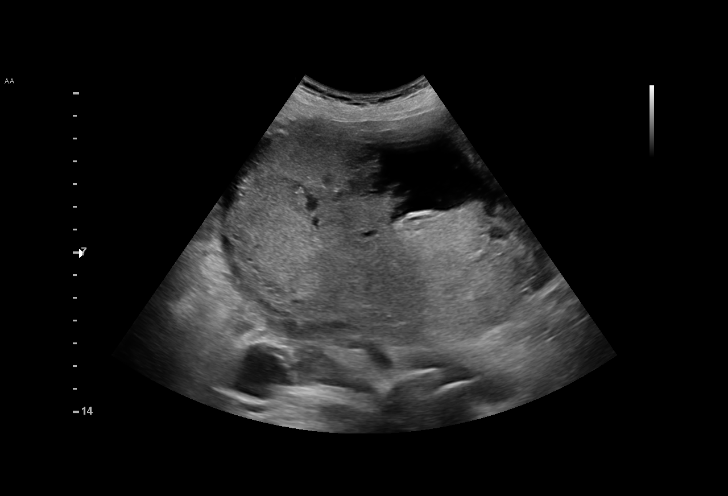
[im 11/15]
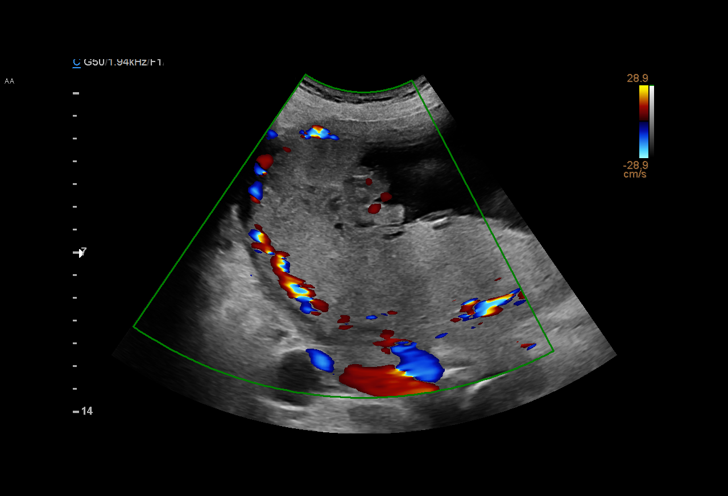
[im 12/15]
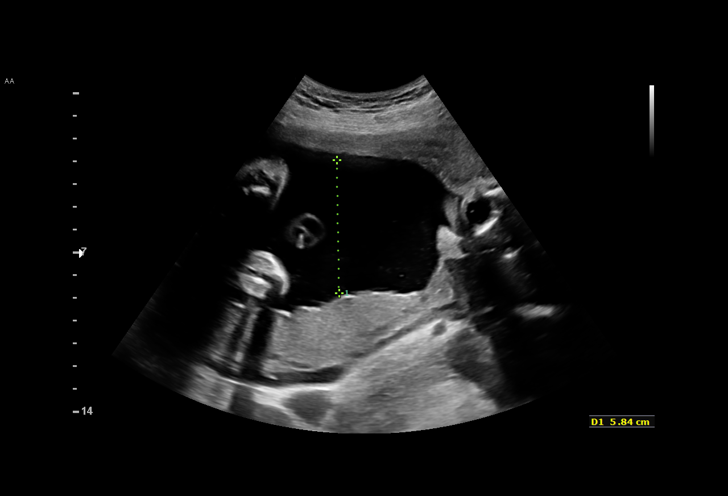
[im 13/15]
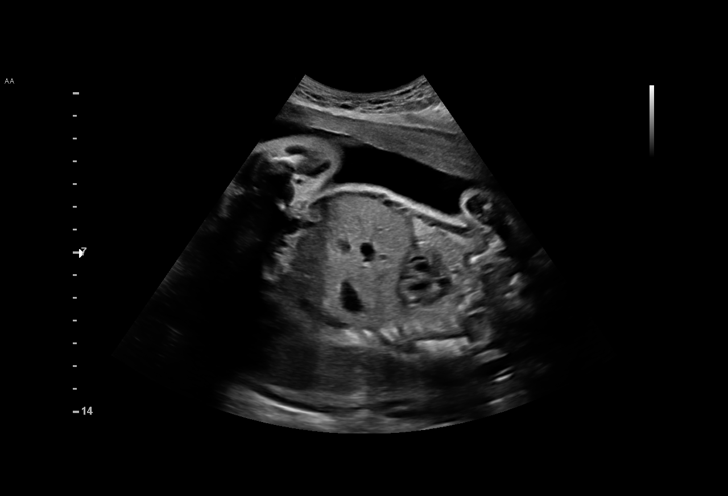
[im 14/15]
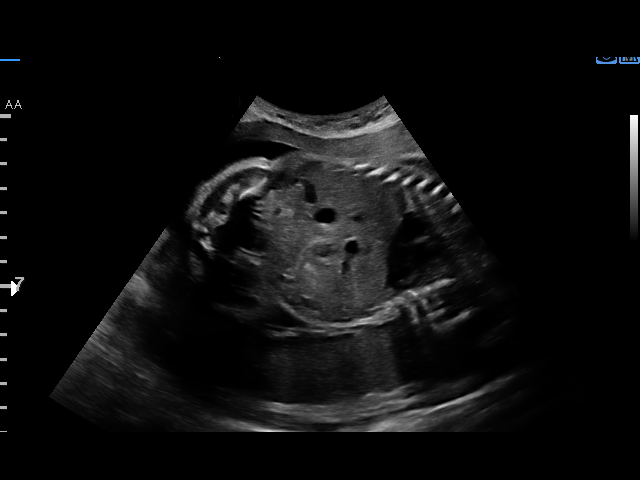
[im 15/15]
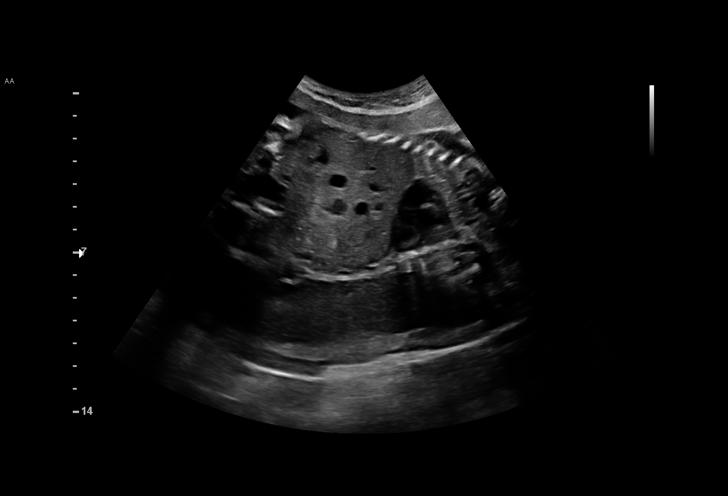

[15 of 15 positions shown; findings below may reference images not displayed]

MAU/Triage

1  ALAIN YVES METIEDJE              [id number]      [id number]     [id number]
Indications

28 weeks gestation of pregnancy
Abdominal pain in pregnancy
Vaginal bleeding in pregnancy, third trimester
OB History

Gravidity:    14        Term:   7        Prem:   7         SAB:   6
Living:       7
Fetal Evaluation

Num Of Fetuses:     1
Fetal Heart         154
Rate(bpm):
Cardiac Activity:   Observed
Presentation:       Cephalic
Placenta:           Posterior, above cervical os

Amniotic Fluid
AFI FV:      Subjectively within normal limits

Largest Pocket(cm)
5.8
Biometry

BPD:      68.4  mm     G. Age:  27w 4d         13  %    CI:         67.96  %    70 - 86
HC:      265.4  mm     G. Age:  29w 0d         33  %
Gestational Age

LMP:           24w 2d        Date:  08/21/15                 EDD:    05/27/16
U/S Today:     28w 2d                                        EDD:    04/29/16
Best:          28w 3d     Det. By:  Early Ultrasound         EDD:    04/28/16
(09/09/15)
Cervix Uterus Adnexa

Cervix
Length:           4.71  cm.
Normal appearance by transabdominal scan.
Impression

SIUP at 28+3 weeks
Normal amniotic fluid volume
Posterior placenta; no previa; no subchorionic fluid
collections/hemorrhage identified
Recommendations

Follow-up ultrasounds as clinically indicated.

## 2017-11-13 ENCOUNTER — Inpatient Hospital Stay (HOSPITAL_COMMUNITY)
Admission: AD | Admit: 2017-11-13 | Discharge: 2017-11-13 | Disposition: A | Discharge status: Home Health | Payer: Medicaid Other | Source: Ambulatory Visit | Attending: Obstetrics & Gynecology | Admitting: Obstetrics & Gynecology

## 2017-11-13 DIAGNOSIS — Z8719 Personal history of other diseases of the digestive system: Secondary | ICD-10-CM

## 2017-11-13 DIAGNOSIS — Z791 Long term (current) use of non-steroidal anti-inflammatories (NSAID): Secondary | ICD-10-CM | POA: Insufficient documentation

## 2017-11-13 DIAGNOSIS — Z880 Allergy status to penicillin: Secondary | ICD-10-CM | POA: Insufficient documentation

## 2017-11-13 DIAGNOSIS — F319 Bipolar disorder, unspecified: Secondary | ICD-10-CM | POA: Insufficient documentation

## 2017-11-13 DIAGNOSIS — I10 Essential (primary) hypertension: Secondary | ICD-10-CM | POA: Insufficient documentation

## 2017-11-13 DIAGNOSIS — N760 Acute vaginitis: Secondary | ICD-10-CM

## 2017-11-13 DIAGNOSIS — R569 Unspecified convulsions: Secondary | ICD-10-CM | POA: Insufficient documentation

## 2017-11-13 DIAGNOSIS — Z79899 Other long term (current) drug therapy: Secondary | ICD-10-CM | POA: Insufficient documentation

## 2017-11-13 DIAGNOSIS — Z87891 Personal history of nicotine dependence: Secondary | ICD-10-CM | POA: Insufficient documentation

## 2017-11-13 DIAGNOSIS — Z9689 Presence of other specified functional implants: Secondary | ICD-10-CM

## 2017-11-13 DIAGNOSIS — B9689 Other specified bacterial agents as the cause of diseases classified elsewhere: Secondary | ICD-10-CM | POA: Insufficient documentation

## 2017-11-13 DIAGNOSIS — F209 Schizophrenia, unspecified: Secondary | ICD-10-CM | POA: Diagnosis not present

## 2017-11-13 DIAGNOSIS — R1033 Periumbilical pain: Secondary | ICD-10-CM | POA: Diagnosis not present

## 2017-11-13 DIAGNOSIS — Z8249 Family history of ischemic heart disease and other diseases of the circulatory system: Secondary | ICD-10-CM | POA: Diagnosis not present

## 2017-11-13 DIAGNOSIS — D573 Sickle-cell trait: Secondary | ICD-10-CM | POA: Insufficient documentation

## 2017-11-13 DIAGNOSIS — Z9889 Other specified postprocedural states: Secondary | ICD-10-CM | POA: Diagnosis not present

## 2017-11-13 LAB — WET PREP, GENITAL
Sperm: NONE SEEN
Trich, Wet Prep: NONE SEEN
Yeast Wet Prep HPF POC: NONE SEEN

## 2017-11-13 LAB — URINALYSIS, ROUTINE W REFLEX MICROSCOPIC
Bilirubin Urine: NEGATIVE
GLUCOSE, UA: NEGATIVE mg/dL
Hgb urine dipstick: NEGATIVE
Ketones, ur: NEGATIVE mg/dL
Leukocytes, UA: NEGATIVE
Nitrite: NEGATIVE
PROTEIN: NEGATIVE mg/dL
SPECIFIC GRAVITY, URINE: 1.023 (ref 1.005–1.030)
pH: 7 (ref 5.0–8.0)

## 2017-11-13 LAB — POCT PREGNANCY, URINE: PREG TEST UR: NEGATIVE

## 2017-11-13 MED ORDER — TRAMADOL HCL 50 MG PO TABS: 50.0000 mg | ORAL_TABLET | Freq: Four times a day (QID) | ORAL | 0 refills | Status: DC | PRN

## 2017-11-13 MED ORDER — METRONIDAZOLE 500 MG PO TABS: 500.0000 mg | ORAL_TABLET | Freq: Two times a day (BID) | ORAL | 0 refills | Status: AC

## 2017-11-13 NOTE — MAU Note (Addendum)
Pt here for belly button x3 months. Had hernia repair in May and has been hurting since. Missed appt with surgeon but has appt coming up. Pt also reports vaginal discharge, odor and itching for several days.

## 2017-11-13 NOTE — MAU Provider Note (Signed)
Chief Complaint:  No chief complaint on file.   First Provider Initiated Contact with Patient 11/13/17 [phone]      HPI: [name] is a 37 y.o. [id number] who presents to maternity admissions reporting pain around her umbilicus for the past 3 months. Had a hernia repair 3 months ago and has not reported persistent pain to surgeon.  Also c/o vaginal discharge with odor and itching.  She reports no vaginal bleeding, urinary symptoms, h/a, dizziness, n/v, or fever/chills.    Went to ED for the umbilical pain and bleeding in May.  States they "laughed at me" and "did not help me".    Vaginal Discharge  The patient's primary symptoms include genital itching, a genital odor and vaginal discharge. The patient's pertinent negatives include no genital lesions, genital rash, pelvic pain or vaginal bleeding. This is a recurrent problem. The current episode started in the past 7 days. The problem occurs constantly. The problem has been unchanged. The pain is moderate. She is not pregnant. Associated symptoms include abdominal pain (around umbilicus). Pertinent negatives include no back pain, chills, constipation, diarrhea, dysuria, fever, headaches, nausea or vomiting. The vaginal discharge was milky and white. There has been no bleeding. She has not been passing clots. She has not been passing tissue. She has tried nothing for the symptoms.    RN Note: Pt here for belly button x3 months. Had hernia repair in May and has been hurting since. Missed appt with surgeon but has appt coming up. Pt also reports vaginal discharge, odor and itching for several days  Past Medical History: Past Medical History:  Diagnosis Date  . Anemia   . Bipolar 1 disorder (HCC)   . Chlamydia   . Chronic abdominal pain   . Chronic nausea   . Deliberate self-cutting   . History of substance abuse   . HSV-2 infection 2015  . Hypertension   . Infection    MRSA in 1995, negative since  . Schizophrenia (HCC)   . Seizures  (HCC)    Not recently  . Sickle cell trait (HCC)   . Trichomonas infection     Past obstetric history: OB History  Gravida Para Term Preterm AB Living  15 8 8  0 6 8  SAB TAB Ectopic Multiple Live Births  6 0 0 0 8    # Outcome Date GA Lbr Len/2nd Weight Sex Delivery Anes PTL Lv  15 Gravida           14 Term 04/18/16 [redacted]w[redacted]d 05:54 / 00:05 6 lb 6.8 oz (2.915 kg) M Vag-Spont EPI  LIV  13 Term 03/23/15 [redacted]w[redacted]d 01:16 / 01:49 6 lb 8.1 oz (2.95 kg) M Vag-Spont EPI  LIV     Birth Comments: Neonatal jaundice, required phototherapy  12 Term 11/28/13 [redacted]w[redacted]d 17:02 / 00:14  M Vag-Spont EPI  LIV  11 Term 04/28/11 [redacted]w[redacted]d 07:06 / 00:12 7 lb (3.175 kg) M Vag-Spont EPI  LIV     Birth Comments: wnl  10 Term 03/28/11   7 lb 13 oz (3.544 kg) F Vag-Spont   LIV  9 Term 09/07/08   6 lb 13 oz (3.09 kg) F Vag-Spont   LIV  8 Term 11/13/06 [redacted]w[redacted]d  7 lb 9 oz (3.43 kg) F Vag-Spont   LIV  7 Term 2003   7 lb 13 oz (3.544 kg) F Vag-Spont   LIV  6 SAB           5 SAB  4 SAB           3 SAB           2 SAB           1 SAB             Past Surgical History: Past Surgical History:  Procedure Laterality Date  . CESAREAN SECTION N/A 03/21/2017   Procedure: CESAREAN SECTION;  Surgeon: Lenox Bing, MD;  Location: [facility] BIRTHING SUITES;  Service: Obstetrics;  Laterality: N/A;  . DILATION AND CURETTAGE OF UTERUS    . HEMORROIDECTOMY  2010  . plastic surgery on face      Family History: Family History  Problem Relation Age of Onset  . Cancer Mother   . Heart failure Mother   . Hypertension Mother   . Stroke Mother   . Diabetes Mother   . Hypertension Maternal Grandmother   . Anesthesia problems Neg Hx     Social History: Social History   Tobacco Use  . Smoking status: Former Smoker    Packs/day: 0.50    Years: 1.50    Pack years: 0.75    Types: Cigarettes    Last attempt to quit: 04/09/2015    Years since quitting: 2.6  . Smokeless tobacco: Never Used  Substance Use Topics  . Alcohol use:  No    Comment: hx drug use  . Drug use: No    Types: Marijuana, Cocaine    Comment: past use, 4 years ago    Allergies:  Allergies  Allergen Reactions  . Peanut-Containing Drug Products Anaphylaxis, Itching and Rash  . Amoxicillin Hives, Swelling and Other (See Comments)    Has patient had a PCN reaction causing immediate rash, facial/tongue/throat swelling, SOB or lightheadedness with hypotension: No Has patient had a PCN reaction causing severe rash involving mucus membranes or skin necrosis: No Has patient had a PCN reaction that required hospitalization No Has patient had a PCN reaction occurring within the last 10 years: Yes If all of the above answers are "NO", then may proceed with Cephalosporin use.  . Latex Hives and Itching    Meds:  Medications Prior to Admission  Medication Sig Dispense Refill Last Dose  . ciprofloxacin (CIPRO) 500 MG tablet Take 500 mg by mouth 2 (two) times daily.  0 08/20/2017 at Unknown time  . docusate sodium (COLACE) 100 MG capsule Take 1 capsule (100 mg total) by mouth daily. (Patient not taking: Reported on 03/20/2017) 10 capsule 0 Not Taking at Unknown time  . ibuprofen (ADVIL,MOTRIN) 200 MG tablet Take 800 mg by mouth every 6 (six) hours as needed for moderate pain.   08/20/2017 at Unknown time  . ibuprofen (ADVIL,MOTRIN) 600 MG tablet Take 1 tablet (600 mg total) by mouth every 6 (six) hours. (Patient not taking: Reported on 08/20/2017) 30 tablet 0 Not Taking at Unknown time  . omeprazole (PRILOSEC OTC) 20 MG tablet Take 1 tablet (20 mg total) daily by mouth. (Patient not taking: Reported on 08/20/2017) 30 tablet 3 Not Taking at Unknown time  . oxyCODONE-acetaminophen (ROXICET) 5-325 MG tablet Take 1-2 tablets by mouth every 6 (six) hours as needed. (Patient not taking: Reported on 08/20/2017) 20 tablet 0 Not Taking at Unknown time  . Prenatal Vit-Fe Fumarate-FA (PREPLUS) 27-1 MG TABS Take 1 tablet by mouth daily. 30 tablet 13 08/20/2017 at Unknown time   . valACYclovir (VALTREX) 1000 MG tablet Take 1 tablet by mouth 2 (two) times daily. For 7 days  0 08/20/2017  at Unknown time    I have reviewed patient's Past Medical Hx, Surgical Hx, Family Hx, Social Hx, medications and allergies.  ROS:  Review of Systems  Constitutional: Negative for chills and fever.  Gastrointestinal: Positive for abdominal pain (around umbilicus). Negative for constipation, diarrhea, nausea and vomiting.  Genitourinary: Positive for vaginal discharge. Negative for dysuria and pelvic pain.  Musculoskeletal: Negative for back pain.  Neurological: Negative for headaches.   Other systems negative     Physical Exam  No data found. Constitutional: Well-developed, well-nourished female in no acute distress.  Cardiovascular: normal rate and rhythm Respiratory: normal effort, no distress.  GI: Abd soft, non-tender, except around umbilicus which is tender all around.  There is no erethema or induration.  Incision (infraumbilical) is well healed.  Tattoo visible around umbilicus..  Nondistended.  No rebound, No guarding.    MS: Extremities nontender, no edema, normal ROM Neurologic: Alert and oriented x 4.   Grossly nonfocal. GU: Neg CVAT. Skin:  Warm and Dry Psych:  Affect appropriate.  PELVIC EXAM: Cervix pink, visually closed, without lesion, scant white creamy discharge, vaginal walls and external genitalia normal   Labs: --/--/A POS (12/13 1448) Results for orders placed or performed during the hospital encounter of 11/13/17 (from the past 72 hour(s))  Wet prep, genital     Status: Abnormal   Collection Time: 11/13/17 12:54 AM  Result Value Ref Range   Yeast Wet Prep HPF POC NONE SEEN NONE SEEN   Trich, Wet Prep NONE SEEN NONE SEEN   Clue Cells Wet Prep HPF POC PRESENT (A) NONE SEEN   WBC, Wet Prep HPF POC FEW (A) NONE SEEN    Comment: MODERATE BACTERIA SEEN   Sperm NONE SEEN     Comment: Performed at [facility], [address], [place], [place]  [address]  Urinalysis, Routine w reflex microscopic     Status: Abnormal   Collection Time: 11/13/17 12:58 AM  Result Value Ref Range   Color, Urine YELLOW YELLOW   APPearance HAZY (A) CLEAR   Specific Gravity, Urine 1.023 1.005 - 1.030   pH 7.0 5.0 - 8.0   Glucose, UA NEGATIVE NEGATIVE mg/dL   Hgb urine dipstick NEGATIVE NEGATIVE   Bilirubin Urine NEGATIVE NEGATIVE   Ketones, ur NEGATIVE NEGATIVE mg/dL   Protein, ur NEGATIVE NEGATIVE mg/dL   Nitrite NEGATIVE NEGATIVE   Leukocytes, UA NEGATIVE NEGATIVE    Comment: Performed at [facility], [address], [place], [place] [address]  Pregnancy, urine POC     Status: None   Collection Time: 11/13/17  1:01 AM  Result Value Ref Range   Preg Test, Ur NEGATIVE NEGATIVE    Comment:        THE SENSITIVITY OF THIS METHODOLOGY IS >24 mIU/mL     Imaging:  No results found.  MAU Course/MDM: I have ordered labs as follows: UA, UPT, Wet prep, GC/Chlamydia Imaging ordered: none Results reviewed. Discussed bacterial vaginosis  Treatments in MAU included none  Discussed I recommend seeing the surgeon tomorrow in their walkin clinic to examine her hernia repair.  Area seems quite tender for 3 months out, but discussed I am nor a surgeon so recommend followup with them .   Pt stable at time of discharge.  Assessment: H/O umbilical hernia repair  Umbilical pain  Bacterial vaginosis   Plan: Discharge home Recommend Call surgeon in am Rx sent for Flagyl  for Bacterial vaginosis  Encouraged to return here or to other Urgent Care/ED if  she develops worsening of symptoms, increase in pain, fever, or other concerning symptoms.   Encouraged to return here or to other Urgent Care/ED if she develops worsening of symptoms, increase in pain, fever, or other concerning symptoms.   [name] CNM, MSN Certified Nurse-Midwife 11/13/2017 12:42 AM

## 2017-11-14 ENCOUNTER — Encounter (HOSPITAL_COMMUNITY): Payer: Self-pay | Admitting: Advanced Practice Midwife

## 2017-11-14 LAB — GC/CHLAMYDIA PROBE AMP (~~LOC~~) NOT AT ARMC
Chlamydia: NEGATIVE
Neisseria Gonorrhea: NEGATIVE

## 2017-11-24 ENCOUNTER — Other Ambulatory Visit: Payer: Self-pay | Admitting: General Surgery

## 2017-11-24 DIAGNOSIS — K429 Umbilical hernia without obstruction or gangrene: Secondary | ICD-10-CM

## 2017-11-25 ENCOUNTER — Ambulatory Visit
Admission: RE | Admit: 2017-11-25 | Discharge: 2017-11-25 | Disposition: A | Discharge status: Home / Self Care | Payer: Medicaid Other | Source: Ambulatory Visit | Attending: General Surgery | Admitting: General Surgery

## 2017-11-25 DIAGNOSIS — K429 Umbilical hernia without obstruction or gangrene: Secondary | ICD-10-CM

## 2017-11-25 MED ORDER — IOPAMIDOL (ISOVUE-300) INJECTION 61%
100.0000 mL | Freq: Once | INTRAVENOUS | Status: AC | PRN
  Administered 2017-11-25: 100 mL via INTRAVENOUS

## 2017-11-26 ENCOUNTER — Ambulatory Visit: Payer: Self-pay | Admitting: General Surgery

## 2017-11-28 NOTE — Patient Instructions (Signed)
[name]  11/28/2017   Your procedure is scheduled on: 12-02-17   [facility]    Report to Admitting at 1:00 PM    Call this number if you have problems the morning of surgery [phone]   Remember: NO SOLID FOOD AFTER MIDNIGHT THE NIGHT PRIOR TO SURGERY. NOTHING BY MOUTH EXCEPT CLEAR LIQUIDS UNTIL 3 HOURS PRIOR TO SCHEULED SURGERY. PLEASE FINISH ENSURE DRINK PER SURGEON ORDER 3 HOURS PRIOR TO SCHEDULED SURGERY TIME WHICH NEEDS TO BE COMPLETED AT 12:00 .     CLEAR LIQUID DIET   Foods Allowed                                                                     Foods Excluded  Coffee and tea, regular and decaf                             liquids that you cannot  Plain Jell-O in any flavor                                             see through such as: Fruit ices (not with fruit pulp)                                     milk, soups, orange juice  Iced Popsicles                                    All solid food Carbonated beverages, regular and diet                                    Cranberry, grape and apple juices Sports drinks like Gatorade Lightly seasoned clear broth or consume(fat free) Sugar, honey syrup  Sample Menu Breakfast                                Lunch                                     Supper Cranberry juice                    Beef broth                            Chicken broth Jell-O                                     Grape juice  Apple juice Coffee or tea                        Jell-O                                      Popsicle                                                Coffee or tea                        Coffee or tea  _____________________________________________________________________     Take these medicines the morning of surgery with A SIP OF WATER: None                                You may not have any metal on your body including hair pins and     piercings  Do not wear jewelry, make-up, lotions, powders or perfumes, deodorant             Do not wear nail polish.  Do not shave  48 hours prior to surgery.             .   Do not bring valuables to the hospital.  IS NOT             RESPONSIBLE   FOR VALUABLES.  Contacts, dentures or bridgework may not be worn into surgery.  Leave suitcase in the car. After surgery it may be brought to your room.     Special Instructions: N/A              Please read over the following fact sheets you were given: _____________________________________________________________________          [facility] - Preparing for Surgery Before surgery, you can play an important role.  Because skin is not sterile, your skin needs to be as free of germs as possible.  You can reduce the number of germs on your skin by washing with CHG (chlorahexidine gluconate) soap before surgery.  CHG is an antiseptic cleaner which kills germs and bonds with the skin to continue killing germs even after washing. Please DO NOT use if you have an allergy to CHG or antibacterial soaps.  If your skin becomes reddened/irritated stop using the CHG and inform your nurse when you arrive at Short Stay. Do not shave (including legs and underarms) for at least 48 hours prior to the first CHG shower.  You may shave your face/neck. Please follow these instructions carefully:  1.  Shower with CHG Soap the night before surgery and the  morning of Surgery.  2.  If you choose to wash your hair, wash your hair first as usual with your  normal  shampoo.  3.  After you shampoo, rinse your hair and body thoroughly to remove the  shampoo.                           4.  Use CHG as you would any other liquid soap.  You can apply chg directly  to the skin and wash  Gently with a scrungie or clean washcloth.  5.  Apply the CHG Soap to your body ONLY FROM THE NECK DOWN.   Do not use on face/ open                            Wound or open sores. Avoid contact with eyes, ears mouth and genitals (private parts).                       Wash face,  Genitals (private parts) with your normal soap.             6.  Wash thoroughly, paying special attention to the area where your surgery  will be performed.  7.  Thoroughly rinse your body with warm water from the neck down.  8.  DO NOT shower/wash with your normal soap after using and rinsing off  the CHG Soap.                9.  Pat yourself dry with a clean towel.            10.  Wear clean pajamas.            11.  Place clean sheets on your bed the night of your first shower and do not  sleep with pets. Day of Surgery : Do not apply any lotions/deodorants the morning of surgery.  Please wear clean clothes to the hospital/surgery center.  FAILURE TO FOLLOW THESE INSTRUCTIONS MAY RESULT IN THE CANCELLATION OF YOUR SURGERY PATIENT SIGNATURE_________________________________  NURSE SIGNATURE__________________________________  ________________________________________________________________________

## 2017-12-01 ENCOUNTER — Encounter (HOSPITAL_COMMUNITY)
Admission: RE | Admit: 2017-12-01 | Discharge: 2017-12-01 | Disposition: A | Discharge status: Home / Self Care | Payer: Medicaid Other | Source: Ambulatory Visit | Attending: General Surgery | Admitting: General Surgery

## 2017-12-01 ENCOUNTER — Other Ambulatory Visit: Payer: Self-pay

## 2017-12-01 ENCOUNTER — Encounter (HOSPITAL_COMMUNITY): Payer: Self-pay | Admitting: *Deleted

## 2017-12-01 DIAGNOSIS — R52 Pain, unspecified: Secondary | ICD-10-CM | POA: Insufficient documentation

## 2017-12-01 DIAGNOSIS — Z01812 Encounter for preprocedural laboratory examination: Secondary | ICD-10-CM | POA: Diagnosis not present

## 2017-12-01 DIAGNOSIS — I10 Essential (primary) hypertension: Secondary | ICD-10-CM | POA: Diagnosis not present

## 2017-12-01 DIAGNOSIS — R001 Bradycardia, unspecified: Secondary | ICD-10-CM | POA: Insufficient documentation

## 2017-12-01 DIAGNOSIS — Z01810 Encounter for preprocedural cardiovascular examination: Secondary | ICD-10-CM | POA: Insufficient documentation

## 2017-12-01 DIAGNOSIS — R9431 Abnormal electrocardiogram [ECG] [EKG]: Secondary | ICD-10-CM | POA: Diagnosis not present

## 2017-12-01 LAB — BASIC METABOLIC PANEL
Anion gap: 8 (ref 5–15)
BUN: 10 mg/dL (ref 6–20)
CHLORIDE: 108 mmol/L (ref 98–111)
CO2: 26 mmol/L (ref 22–32)
CREATININE: 0.72 mg/dL (ref 0.44–1.00)
Calcium: 9.2 mg/dL (ref 8.9–10.3)
GFR calc Af Amer: >60 mL/min (ref >60)
GFR calc non Af Amer: >60 mL/min (ref >60)
GLUCOSE: 77 mg/dL (ref 70–99)
POTASSIUM: 3.4 mmol/L — AB (ref 3.5–5.1)
SODIUM: 142 mmol/L (ref 135–145)

## 2017-12-01 LAB — CBC
HCT: 36.9 % (ref 36.0–46.0)
Hemoglobin: 12.4 g/dL (ref 12.0–15.0)
MCH: 29.2 pg (ref 26.0–34.0)
MCHC: 33.6 g/dL (ref 30.0–36.0)
MCV: 86.8 fL (ref 78.0–100.0)
PLATELETS: 213 10*3/uL (ref 150–400)
RBC: 4.25 MIL/uL (ref 3.87–5.11)
RDW: 14.3 % (ref 11.5–15.5)
WBC: 6.3 10*3/uL (ref 4.0–10.5)

## 2017-12-01 MED ORDER — BUPIVACAINE LIPOSOME 1.3 % IJ SUSP
20.0000 mL | Freq: Once | INTRAMUSCULAR | Status: DC
  Filled 2017-12-01: qty 20

## 2017-12-02 ENCOUNTER — Ambulatory Visit (HOSPITAL_COMMUNITY): Payer: Medicaid Other | Admitting: Anesthesiology

## 2017-12-02 ENCOUNTER — Encounter (HOSPITAL_COMMUNITY): Payer: Self-pay | Admitting: Anesthesiology

## 2017-12-02 ENCOUNTER — Observation Stay (HOSPITAL_COMMUNITY)
Admission: RE | Admit: 2017-12-02 | Discharge: 2017-12-03 | Disposition: A | Discharge status: Home / Self Care | Payer: Medicaid Other | Source: Ambulatory Visit | Attending: General Surgery | Admitting: General Surgery

## 2017-12-02 ENCOUNTER — Encounter (HOSPITAL_COMMUNITY)
Admission: RE | Disposition: A | Discharge status: Home / Self Care | Payer: Self-pay | Source: Ambulatory Visit | Attending: General Surgery

## 2017-12-02 DIAGNOSIS — R109 Unspecified abdominal pain: Secondary | ICD-10-CM | POA: Diagnosis present

## 2017-12-02 DIAGNOSIS — T85848A Pain due to other internal prosthetic devices, implants and grafts, initial encounter: Principal | ICD-10-CM | POA: Insufficient documentation

## 2017-12-02 DIAGNOSIS — Z87891 Personal history of nicotine dependence: Secondary | ICD-10-CM | POA: Insufficient documentation

## 2017-12-02 DIAGNOSIS — F319 Bipolar disorder, unspecified: Secondary | ICD-10-CM | POA: Diagnosis not present

## 2017-12-02 DIAGNOSIS — Z79899 Other long term (current) drug therapy: Secondary | ICD-10-CM | POA: Diagnosis not present

## 2017-12-02 DIAGNOSIS — I10 Essential (primary) hypertension: Secondary | ICD-10-CM | POA: Insufficient documentation

## 2017-12-02 DIAGNOSIS — Y838 Other surgical procedures as the cause of abnormal reaction of the patient, or of later complication, without mention of misadventure at the time of the procedure: Secondary | ICD-10-CM | POA: Insufficient documentation

## 2017-12-02 DIAGNOSIS — D649 Anemia, unspecified: Secondary | ICD-10-CM | POA: Insufficient documentation

## 2017-12-02 DIAGNOSIS — Y929 Unspecified place or not applicable: Secondary | ICD-10-CM | POA: Diagnosis not present

## 2017-12-02 HISTORY — PX: UMBILICAL HERNIA REPAIR: SHX196

## 2017-12-02 LAB — CREATININE, SERUM
CREATININE: 0.67 mg/dL (ref 0.44–1.00)
GFR calc Af Amer: >60 mL/min (ref >60)

## 2017-12-02 LAB — CBC
HCT: 37.3 % (ref 36.0–46.0)
Hemoglobin: 12.6 g/dL (ref 12.0–15.0)
MCH: 29.2 pg (ref 26.0–34.0)
MCHC: 33.8 g/dL (ref 30.0–36.0)
MCV: 86.5 fL (ref 78.0–100.0)
PLATELETS: 217 10*3/uL (ref 150–400)
RBC: 4.31 MIL/uL (ref 3.87–5.11)
RDW: 14.2 % (ref 11.5–15.5)
WBC: 15.2 10*3/uL — AB (ref 4.0–10.5)

## 2017-12-02 SURGERY — REPAIR, HERNIA, UMBILICAL, LAPAROSCOPIC
Anesthesia: General | Site: Abdomen

## 2017-12-02 MED ORDER — LIDOCAINE 2% (20 MG/ML) 5 ML SYRINGE
INTRAMUSCULAR | Status: AC
  Filled 2017-12-02: qty 5

## 2017-12-02 MED ORDER — PROMETHAZINE HCL 25 MG/ML IJ SOLN: 6.2500 mg | INTRAMUSCULAR | Status: DC | PRN

## 2017-12-02 MED ORDER — CELECOXIB 200 MG PO CAPS
400.0000 mg | ORAL_CAPSULE | ORAL | Status: AC
  Administered 2017-12-02: 400 mg via ORAL
  Filled 2017-12-02: qty 2

## 2017-12-02 MED ORDER — MORPHINE SULFATE (PF) 2 MG/ML IV SOLN
2.0000 mg | INTRAVENOUS | Status: DC | PRN
  Administered 2017-12-02 – 2017-12-03 (×4): 2 mg via INTRAVENOUS
  Filled 2017-12-02 (×4): qty 1

## 2017-12-02 MED ORDER — SODIUM CHLORIDE 0.9 % IJ SOLN
INTRAMUSCULAR | Status: AC
  Filled 2017-12-02: qty 20

## 2017-12-02 MED ORDER — SUGAMMADEX SODIUM 200 MG/2ML IV SOLN
INTRAVENOUS | Status: DC | PRN
  Administered 2017-12-02: 200 mg via INTRAVENOUS

## 2017-12-02 MED ORDER — DIPHENHYDRAMINE HCL 50 MG/ML IJ SOLN: 25.0000 mg | Freq: Four times a day (QID) | INTRAMUSCULAR | Status: DC | PRN

## 2017-12-02 MED ORDER — CHLORHEXIDINE GLUCONATE CLOTH 2 % EX PADS: 6.0000 | MEDICATED_PAD | Freq: Once | CUTANEOUS | Status: DC

## 2017-12-02 MED ORDER — ROCURONIUM BROMIDE 10 MG/ML (PF) SYRINGE
PREFILLED_SYRINGE | INTRAVENOUS | Status: DC | PRN
  Administered 2017-12-02: 5 mg via INTRAVENOUS
  Administered 2017-12-02: 40 mg via INTRAVENOUS

## 2017-12-02 MED ORDER — ENOXAPARIN SODIUM 40 MG/0.4ML ~~LOC~~ SOLN
40.0000 mg | SUBCUTANEOUS | Status: DC
  Administered 2017-12-03: 40 mg via SUBCUTANEOUS
  Filled 2017-12-02: qty 0.4

## 2017-12-02 MED ORDER — MIDAZOLAM HCL 2 MG/2ML IJ SOLN
INTRAMUSCULAR | Status: AC
  Filled 2017-12-02: qty 2

## 2017-12-02 MED ORDER — BUPIVACAINE LIPOSOME 1.3 % IJ SUSP
INTRAMUSCULAR | Status: DC | PRN
  Administered 2017-12-02: 20 mL

## 2017-12-02 MED ORDER — GABAPENTIN 300 MG PO CAPS
300.0000 mg | ORAL_CAPSULE | Freq: Two times a day (BID) | ORAL | Status: DC
  Administered 2017-12-02 – 2017-12-03 (×2): 300 mg via ORAL
  Filled 2017-12-02 (×2): qty 1

## 2017-12-02 MED ORDER — LACTATED RINGERS IV SOLN
INTRAVENOUS | Status: DC
  Administered 2017-12-02 (×2): via INTRAVENOUS

## 2017-12-02 MED ORDER — CIPROFLOXACIN IN D5W 400 MG/200ML IV SOLN
400.0000 mg | INTRAVENOUS | Status: AC
  Administered 2017-12-02: 400 mg via INTRAVENOUS
  Filled 2017-12-02: qty 200

## 2017-12-02 MED ORDER — HYDROCODONE-ACETAMINOPHEN 5-325 MG PO TABS
1.0000 | ORAL_TABLET | ORAL | Status: DC | PRN
  Administered 2017-12-02: 2 via ORAL
  Filled 2017-12-02: qty 2

## 2017-12-02 MED ORDER — HYDROMORPHONE HCL 1 MG/ML IJ SOLN: 0.2500 mg | INTRAMUSCULAR | Status: DC | PRN

## 2017-12-02 MED ORDER — LIDOCAINE 2% (20 MG/ML) 5 ML SYRINGE
INTRAMUSCULAR | Status: DC | PRN
  Administered 2017-12-02: 100 mg via INTRAVENOUS

## 2017-12-02 MED ORDER — ONDANSETRON HCL 4 MG/2ML IJ SOLN
4.0000 mg | Freq: Four times a day (QID) | INTRAMUSCULAR | Status: DC | PRN
  Administered 2017-12-02: 4 mg via INTRAVENOUS
  Filled 2017-12-02: qty 2

## 2017-12-02 MED ORDER — LACTATED RINGERS IV SOLN: INTRAVENOUS | Status: DC

## 2017-12-02 MED ORDER — ACETAMINOPHEN 650 MG RE SUPP: 650.0000 mg | Freq: Four times a day (QID) | RECTAL | Status: DC | PRN

## 2017-12-02 MED ORDER — BUPIVACAINE-EPINEPHRINE (PF) 0.25% -1:200000 IJ SOLN
INTRAMUSCULAR | Status: AC
  Filled 2017-12-02: qty 30

## 2017-12-02 MED ORDER — GABAPENTIN 300 MG PO CAPS
300.0000 mg | ORAL_CAPSULE | ORAL | Status: AC
  Administered 2017-12-02: 300 mg via ORAL
  Filled 2017-12-02: qty 1

## 2017-12-02 MED ORDER — MEPERIDINE HCL 50 MG/ML IJ SOLN: 6.2500 mg | INTRAMUSCULAR | Status: DC | PRN

## 2017-12-02 MED ORDER — KETOROLAC TROMETHAMINE 30 MG/ML IJ SOLN
30.0000 mg | Freq: Four times a day (QID) | INTRAMUSCULAR | Status: DC | PRN
  Administered 2017-12-02 – 2017-12-03 (×2): 30 mg via INTRAVENOUS
  Filled 2017-12-02 (×2): qty 1

## 2017-12-02 MED ORDER — 0.9 % SODIUM CHLORIDE (POUR BTL) OPTIME
TOPICAL | Status: DC | PRN
  Administered 2017-12-02: 1000 mL

## 2017-12-02 MED ORDER — KETAMINE HCL 10 MG/ML IJ SOLN
INTRAMUSCULAR | Status: AC
  Filled 2017-12-02: qty 1

## 2017-12-02 MED ORDER — PROPOFOL 10 MG/ML IV BOLUS
INTRAVENOUS | Status: AC
  Filled 2017-12-02: qty 20

## 2017-12-02 MED ORDER — LIDOCAINE 2% (20 MG/ML) 5 ML SYRINGE
INTRAMUSCULAR | Status: AC
  Filled 2017-12-02: qty 10

## 2017-12-02 MED ORDER — KETAMINE HCL 10 MG/ML IJ SOLN
INTRAMUSCULAR | Status: DC | PRN
  Administered 2017-12-02: 50 mg via INTRAVENOUS

## 2017-12-02 MED ORDER — SUGAMMADEX SODIUM 200 MG/2ML IV SOLN
INTRAVENOUS | Status: AC
  Filled 2017-12-02: qty 2

## 2017-12-02 MED ORDER — HYDRALAZINE HCL 20 MG/ML IJ SOLN: 10.0000 mg | INTRAMUSCULAR | Status: DC | PRN

## 2017-12-02 MED ORDER — OMEPRAZOLE MAGNESIUM 20 MG PO TBEC: 20.0000 mg | DELAYED_RELEASE_TABLET | Freq: Every day | ORAL | Status: DC

## 2017-12-02 MED ORDER — ACETAMINOPHEN 500 MG PO TABS
1000.0000 mg | ORAL_TABLET | ORAL | Status: AC
  Administered 2017-12-02: 1000 mg via ORAL
  Filled 2017-12-02: qty 2

## 2017-12-02 MED ORDER — PROPOFOL 10 MG/ML IV BOLUS
INTRAVENOUS | Status: DC | PRN
  Administered 2017-12-02: 110 mg via INTRAVENOUS

## 2017-12-02 MED ORDER — ROCURONIUM BROMIDE 100 MG/10ML IV SOLN
INTRAVENOUS | Status: AC
  Filled 2017-12-02: qty 1

## 2017-12-02 MED ORDER — DOCUSATE SODIUM 100 MG PO CAPS
100.0000 mg | ORAL_CAPSULE | Freq: Every day | ORAL | Status: DC
  Administered 2017-12-02 – 2017-12-03 (×2): 100 mg via ORAL
  Filled 2017-12-02 (×2): qty 1

## 2017-12-02 MED ORDER — FENTANYL CITRATE (PF) 100 MCG/2ML IJ SOLN
INTRAMUSCULAR | Status: DC | PRN
  Administered 2017-12-02 (×3): 50 ug via INTRAVENOUS
  Administered 2017-12-02: 100 ug via INTRAVENOUS

## 2017-12-02 MED ORDER — ENSURE PRE-SURGERY PO LIQD
296.0000 mL | Freq: Once | ORAL | Status: DC
  Filled 2017-12-02: qty 296

## 2017-12-02 MED ORDER — TRAMADOL HCL 50 MG PO TABS: 50.0000 mg | ORAL_TABLET | Freq: Four times a day (QID) | ORAL | Status: DC | PRN

## 2017-12-02 MED ORDER — LIDOCAINE 2% (20 MG/ML) 5 ML SYRINGE
INTRAMUSCULAR | Status: DC | PRN
  Administered 2017-12-02: 1.5 mg/kg/h via INTRAVENOUS

## 2017-12-02 MED ORDER — ONDANSETRON HCL 4 MG/2ML IJ SOLN
INTRAMUSCULAR | Status: DC | PRN
  Administered 2017-12-02: 4 mg via INTRAVENOUS

## 2017-12-02 MED ORDER — FENTANYL CITRATE (PF) 250 MCG/5ML IJ SOLN
INTRAMUSCULAR | Status: AC
  Filled 2017-12-02: qty 5

## 2017-12-02 MED ORDER — DEXAMETHASONE SODIUM PHOSPHATE 10 MG/ML IJ SOLN
INTRAMUSCULAR | Status: AC
  Filled 2017-12-02: qty 1

## 2017-12-02 MED ORDER — DEXAMETHASONE SODIUM PHOSPHATE 10 MG/ML IJ SOLN
INTRAMUSCULAR | Status: DC | PRN
  Administered 2017-12-02: 10 mg via INTRAVENOUS

## 2017-12-02 MED ORDER — ONDANSETRON HCL 4 MG/2ML IJ SOLN
INTRAMUSCULAR | Status: AC
  Filled 2017-12-02: qty 2

## 2017-12-02 MED ORDER — MIDAZOLAM HCL 5 MG/5ML IJ SOLN
INTRAMUSCULAR | Status: DC | PRN
  Administered 2017-12-02: 2 mg via INTRAVENOUS

## 2017-12-02 MED ORDER — BUPIVACAINE-EPINEPHRINE 0.25% -1:200000 IJ SOLN
INTRAMUSCULAR | Status: DC | PRN
  Administered 2017-12-02: 30 mL

## 2017-12-02 MED ORDER — SODIUM CHLORIDE 0.45 % IV SOLN
INTRAVENOUS | Status: DC
  Administered 2017-12-02: 17:00:00 via INTRAVENOUS

## 2017-12-02 MED ORDER — ACETAMINOPHEN 325 MG PO TABS: 650.0000 mg | ORAL_TABLET | Freq: Four times a day (QID) | ORAL | Status: DC | PRN

## 2017-12-02 MED ORDER — DIPHENHYDRAMINE HCL 25 MG PO CAPS: 25.0000 mg | ORAL_CAPSULE | Freq: Four times a day (QID) | ORAL | Status: DC | PRN

## 2017-12-02 MED ORDER — ONDANSETRON 4 MG PO TBDP: 4.0000 mg | ORAL_TABLET | Freq: Four times a day (QID) | ORAL | Status: DC | PRN

## 2017-12-02 MED ORDER — OXYCODONE HCL 5 MG PO TABS
10.0000 mg | ORAL_TABLET | ORAL | Status: DC | PRN
  Administered 2017-12-02 – 2017-12-03 (×3): 10 mg via ORAL
  Filled 2017-12-02 (×3): qty 2

## 2017-12-02 SURGICAL SUPPLY — 41 items
APL SKNCLS STERI-STRIP NONHPOA (GAUZE/BANDAGES/DRESSINGS) ×1
APPLIER CLIP 5 13 M/L LIGAMAX5 (MISCELLANEOUS)
APR CLP MED LRG 5 ANG JAW (MISCELLANEOUS)
BANDAGE ADH SHEER 1  50/CT (GAUZE/BANDAGES/DRESSINGS) ×15 IMPLANT
BENZOIN TINCTURE PRP APPL 2/3 (GAUZE/BANDAGES/DRESSINGS) ×3 IMPLANT
BINDER ABDOMINAL 12 ML 46-62 (SOFTGOODS) IMPLANT
CHLORAPREP W/TINT 26ML (MISCELLANEOUS) ×3 IMPLANT
CLIP APPLIE 5 13 M/L LIGAMAX5 (MISCELLANEOUS) IMPLANT
CLOSURE WOUND 1/2 X4 (GAUZE/BANDAGES/DRESSINGS) ×1
COVER SURGICAL LIGHT HANDLE (MISCELLANEOUS) ×3 IMPLANT
DECANTER SPIKE VIAL GLASS SM (MISCELLANEOUS) ×3 IMPLANT
DEVICE SECURE STRAP 25 ABSORB (INSTRUMENTS) IMPLANT
DRAIN CHANNEL 19F RND (DRAIN) IMPLANT
ELECT REM PT RETURN 15FT ADLT (MISCELLANEOUS) ×3 IMPLANT
EVACUATOR SILICONE 100CC (DRAIN) IMPLANT
GLOVE BIOGEL PI IND STRL 7.0 (GLOVE) ×1 IMPLANT
GLOVE BIOGEL PI INDICATOR 7.0 (GLOVE) ×2
GLOVE SURG SS PI 7.0 STRL IVOR (GLOVE) ×3 IMPLANT
GOWN STRL REUS W/TWL LRG LVL3 (GOWN DISPOSABLE) ×3 IMPLANT
GOWN STRL REUS W/TWL XL LVL3 (GOWN DISPOSABLE) ×6 IMPLANT
GRASPER SUT TROCAR 14GX15 (MISCELLANEOUS) ×3 IMPLANT
IRRIG SUCT STRYKERFLOW 2 WTIP (MISCELLANEOUS)
IRRIGATION SUCT STRKRFLW 2 WTP (MISCELLANEOUS) IMPLANT
KIT BASIN OR (CUSTOM PROCEDURE TRAY) ×3 IMPLANT
MARKER SKIN DUAL TIP RULER LAB (MISCELLANEOUS) IMPLANT
POSITIONER SURGICAL ARM (MISCELLANEOUS) IMPLANT
SCISSORS LAP 5X35 DISP (ENDOMECHANICALS) ×3 IMPLANT
SHEARS HARMONIC ACE PLUS 36CM (ENDOMECHANICALS) IMPLANT
SLEEVE XCEL OPT CAN 5 100 (ENDOMECHANICALS) ×5 IMPLANT
STRIP CLOSURE SKIN 1/2X4 (GAUZE/BANDAGES/DRESSINGS) ×2 IMPLANT
SUT ETHILON 2 0 PS N (SUTURE) IMPLANT
SUT MNCRL AB 4-0 PS2 18 (SUTURE) ×3 IMPLANT
SUT NOVA 0 T19/GS 22DT (SUTURE) IMPLANT
SUT VICRYL 0 UR6 27IN ABS (SUTURE) IMPLANT
TAPE CLOTH 4X10 WHT NS (GAUZE/BANDAGES/DRESSINGS) IMPLANT
TOWEL OR 17X26 10 PK STRL BLUE (TOWEL DISPOSABLE) ×3 IMPLANT
TRAY LAPAROSCOPIC (CUSTOM PROCEDURE TRAY) ×3 IMPLANT
TROCAR BLADELESS OPT 5 100 (ENDOMECHANICALS) ×3 IMPLANT
TROCAR XCEL 12X100 BLDLESS (ENDOMECHANICALS) IMPLANT
TROCAR XCEL NON-BLD 11X100MML (ENDOMECHANICALS) IMPLANT
TUBING INSUF HEATED (TUBING) ×3 IMPLANT

## 2017-12-02 NOTE — Op Note (Addendum)
Preoperative diagnosis: pain after umbilical hernia repair  Postoperative diagnosis: same   Procedure: laparoscopic excision of umbilical underlay mesh  Surgeon: Feliciana RossettiLuke Burnice Oestreicher, M.D.  Asst: none  Anesthesia: general  Indications for procedure: [name] is a 37 y.o. year old female with symptoms of abdominal pain constantly for more than 3 months after umbilical hernia without improvement with medication or time.  Description of procedure: The patient was brought into the operative suite. Anesthesia was administered with General endotracheal anesthesia. WHO checklist was applied. The patient was then placed in supine position. The area was prepped and draped in the usual sterile fashion.  Next, a transverse incision was made in the left upper quadrant.  Peritoneum entered.  Pressure.  Initially the omentum was adherent to the abdominal mesh.  Left-sided transversus abdominis plane block was placed with Exparel Marcaine.  2 additional trochars were placed in the left mid left lower quadrant of the abdomen.  All trochars were placed under direct visualization laparoscope.  Dissection was then used to remove the omentum away from the mesh.  Once complete, a right-sided transversus abdominis plane block was placed with Exparel and Marcaine.  The mesh appeared in normal flat position and was well incorporated to the abdominal wall. Next, sharp dissection was used to decrease the mesh from the peritoneum and fascia.  Care was taken to remove it completely to leave the fascia intact.  The mesh was able to completely freed from the fascia with significant fascia.  Freed, the mesh was cut into thin contiguous strips and removed trocar site.  On the back table mesh was reconstructed to ensure all of it was completely removed.  The peritoneum site was reinspected.  There was no palpable hernia or visualized fascial defects.  Hemostasis was intact.  Pneumoperitoneum was evacuated.  All trochars were  removed.  All sites were closed with 4-0 Monocryl interrupted subcuticular fashion.  Findings: Normal flat position, entirety, no Fascial defects  Specimen: Abdominal wall mesh  Implant: none   Blood loss: <2730ml  Local anesthesia: 50 ml Exparel:Marcaine Mix  Complications: none  Pictures: Mesh prior to removal  abdominal wall after removal    Mesh in its entirety removed  Feliciana RossettiLuke Hiilei Gerst, M.D. General, Bariatric, & Minimally Invasive Surgery [facility]

## 2017-12-02 NOTE — Anesthesia Procedure Notes (Signed)
Procedure Name: Intubation Date/Time: 12/02/2017 2:34 PM Performed by: Florene Routeeardon, Denaya Horn L, CRNA Patient Re-evaluated:Patient Re-evaluated prior to induction Oxygen Delivery Method: Circle system utilized Preoxygenation: Pre-oxygenation with 100% oxygen Induction Type: IV induction Ventilation: Mask ventilation without difficulty and Oral airway inserted - appropriate to patient size Laryngoscope Size: Miller and 2 Grade View: Grade I Tube size: 7.5 mm Number of attempts: 1 Airway Equipment and Method: Stylet Placement Confirmation: ETT inserted through vocal cords under direct vision,  positive ETCO2 and breath sounds checked- equal and bilateral Secured at: 20 cm Tube secured with: Tape Dental Injury: Teeth and Oropharynx as per pre-operative assessment

## 2017-12-02 NOTE — H&P (Signed)
[name] is an 37 y.o. female.   Chief Complaint: abdominal pain HPI: 37 yo female with abdominal pain after hernia repair. Pain has not improved with time and conservative management or muscle relaxants or neuropathy analgesics.  Past Medical History:  Diagnosis Date  . Anemia   . Bipolar 1 disorder ([place])   . Chlamydia   . Chronic abdominal pain   . Chronic nausea   . Deliberate self-cutting   . History of substance abuse   . HSV-2 infection 2015  . Hypertension   . Infection    MRSA in 1995, negative since  . Schizophrenia ([place])   . Seizures ([place])    Not recently  . Sickle cell trait ([place])   . Trichomonas infection     Past Surgical History:  Procedure Laterality Date  . CESAREAN SECTION N/A 03/21/2017   Procedure: CESAREAN SECTION;  Surgeon: Aletha Halim, MD;  Location: [place];  Service: Obstetrics;  Laterality: N/A;  . DILATION AND CURETTAGE OF UTERUS    . HEMORROIDECTOMY  2010  . plastic surgery on face      Family History  Problem Relation Age of Onset  . Cancer Mother   . Heart failure Mother   . Hypertension Mother   . Stroke Mother   . Diabetes Mother   . Hypertension Maternal Grandmother   . Anesthesia problems Neg Hx    Social History:  reports that she quit smoking about 2 years ago. Her smoking use included cigarettes. She has a 0.75 pack-year smoking history. She has never used smokeless tobacco. She reports that she does not drink alcohol or use drugs.  Allergies:  Allergies  Allergen Reactions  . Peanut-Containing Drug Products Anaphylaxis, Itching and Rash  . Amoxicillin Hives, Swelling and Other (See Comments)    Has patient had a PCN reaction causing immediate rash, facial/tongue/throat swelling, SOB or lightheadedness with hypotension: No Has patient had a PCN reaction causing severe rash involving mucus membranes or skin necrosis: No Has patient had a PCN reaction that required hospitalization No Has patient had a PCN  reaction occurring within the last 10 years: Yes If all of the above answers are "NO", then may proceed with Cephalosporin use.  . Latex Hives and Itching    Medications Prior to Admission  Medication Sig Dispense Refill  . HYDROcodone-acetaminophen (NORCO/VICODIN) 5-325 MG tablet Take 1 tablet by mouth every 6 (six) hours as needed.  0  . ondansetron (ZOFRAN-ODT) 4 MG disintegrating tablet Take 4 mg by mouth every 8 (eight) hours as needed for nausea.  0  . docusate sodium (COLACE) 100 MG capsule Take 1 capsule (100 mg total) by mouth daily. (Patient not taking: Reported on 03/20/2017) 10 capsule 0  . ibuprofen (ADVIL,MOTRIN) 600 MG tablet Take 1 tablet (600 mg total) by mouth every 6 (six) hours. (Patient not taking: Reported on 08/20/2017) 30 tablet 0  . omeprazole (PRILOSEC OTC) 20 MG tablet Take 1 tablet (20 mg total) daily by mouth. (Patient not taking: Reported on 08/20/2017) 30 tablet 3  . oxyCODONE-acetaminophen (ROXICET) 5-325 MG tablet Take 1-2 tablets by mouth every 6 (six) hours as needed. (Patient not taking: Reported on 08/20/2017) 20 tablet 0  . Prenatal Vit-Fe Fumarate-FA (PREPLUS) 27-1 MG TABS Take 1 tablet by mouth daily. (Patient not taking: Reported on 11/27/2017) 30 tablet 13  . traMADol (ULTRAM) 50 MG tablet Take 1 tablet (50 mg total) by mouth every 6 (six) hours as needed. (Patient not taking: Reported on 11/27/2017)  10 tablet 0    Results for orders placed or performed during the hospital encounter of 12/01/17 (from the past 48 hour(s))  CBC     Status: None   Collection Time: 12/01/17  1:53 PM  Result Value Ref Range   WBC 6.3 4.0 - 10.5 K/uL   RBC 4.25 3.87 - 5.11 MIL/uL   Hemoglobin 12.4 12.0 - 15.0 g/dL   HCT 36.9 36.0 - 46.0 %   MCV 86.8 78.0 - 100.0 fL   MCH 29.2 26.0 - 34.0 pg   MCHC 33.6 30.0 - 36.0 g/dL   RDW 14.3 11.5 - 15.5 %   Platelets 213 150 - 400 K/uL    Comment: Performed at [facility], [place] [address] [place], [place] [address]   Basic metabolic panel     Status: Abnormal   Collection Time: 12/01/17  1:53 PM  Result Value Ref Range   Sodium 142 135 - 145 mmol/L   Potassium 3.4 (L) 3.5 - 5.1 mmol/L   Chloride 108 98 - 111 mmol/L   CO2 26 22 - 32 mmol/L   Glucose, Bld 77 70 - 99 mg/dL   BUN 10 6 - 20 mg/dL   Creatinine, Ser 0.72 0.44 - 1.00 mg/dL   Calcium 9.2 8.9 - 10.3 mg/dL   GFR calc non Af Amer >60 >60 mL/min   GFR calc Af Amer >60 >60 mL/min    Comment: (NOTE) The eGFR has been calculated using the CKD EPI equation. This calculation has not been validated in all clinical situations. eGFR's persistently <60 mL/min signify possible Chronic Kidney Disease.    Anion gap 8 5 - 15    Comment: Performed at [facility], [place] [address], [place], [place] [address]   No results found.  Review of Systems  Constitutional: Positive for weight loss. Negative for chills and fever.  HENT: Negative for hearing loss.   Eyes: Negative for blurred vision and double vision.  Respiratory: Negative for cough and hemoptysis.   Cardiovascular: Negative for chest pain and palpitations.  Gastrointestinal: Positive for abdominal pain and nausea. Negative for vomiting.  Genitourinary: Negative for dysuria and urgency.  Musculoskeletal: Negative for myalgias and neck pain.  Skin: Negative for itching and rash.  Neurological: Negative for dizziness, tingling and headaches.  Endo/Heme/Allergies: Does not bruise/bleed easily.  Psychiatric/Behavioral: Negative for depression and suicidal ideas.    Blood pressure (!) 144/92, pulse 76, temperature 99.1 F (37.3 C), temperature source Oral, resp. rate 16, SpO2 100 %, not currently breastfeeding. Physical Exam  Vitals reviewed. Constitutional: She is oriented to person, place, and time. She appears well-developed and well-nourished.  HENT:  Head: Normocephalic and atraumatic.  Eyes: Pupils are equal, round, and reactive to light. Conjunctivae and EOM are  normal.  Neck: Normal range of motion. Neck supple.  Cardiovascular: Normal rate and regular rhythm.  Respiratory: Effort normal and breath sounds normal.  GI: Soft. Bowel sounds are normal. She exhibits no distension. There is no tenderness.  Musculoskeletal: Normal range of motion.  Neurological: She is alert and oriented to person, place, and time.  Skin: Skin is warm and dry.  Psychiatric: She has a normal mood and affect. Her behavior is normal.     Assessment/Plan 37 yo female with pain after umbilical hernia repair. CT shows no abnormalities in the area. Due to persistent pain patient would like mesh removed -lap mesh removal -observation post op  Mickeal Skinner, MD 12/02/2017, 1:22 PM

## 2017-12-02 NOTE — Anesthesia Preprocedure Evaluation (Addendum)
Anesthesia Evaluation  Patient identified by MRN, date of birth, ID band Patient awake    Reviewed: Allergy & Precautions, NPO status , Patient's Chart, lab work & pertinent test results  Airway Mallampati: I  TM Distance: >3 FB Neck ROM: Full    Dental  (+) Poor Dentition, Loose, Chipped, Missing, Dental Advisory Given,    Pulmonary former smoker,     + decreased breath sounds      Cardiovascular hypertension,  Rhythm:Regular Rate:Normal     Neuro/Psych Seizures -,  PSYCHIATRIC DISORDERS Bipolar Disorder Schizophrenia    GI/Hepatic Neg liver ROS, GERD  Medicated,  Endo/Other  negative endocrine ROS  Renal/GU negative Renal ROS     Musculoskeletal negative musculoskeletal ROS (+)   Abdominal Normal abdominal exam  (+)   Peds  Hematology  (+) Sickle cell trait ,   Anesthesia Other Findings   Reproductive/Obstetrics                            Lab Results  Component Value Date   WBC 6.3 12/01/2017   HGB 12.4 12/01/2017   HCT 36.9 12/01/2017   MCV 86.8 12/01/2017   PLT 213 12/01/2017   Lab Results  Component Value Date   CREATININE 0.72 12/01/2017   BUN 10 12/01/2017   NA 142 12/01/2017   K 3.4 (L) 12/01/2017   CL 108 12/01/2017   CO2 26 12/01/2017   No results found for: INR, PROTIME  EKG: sinus bradycardia.   Anesthesia Physical Anesthesia Plan  ASA: II  Anesthesia Plan: General   Post-op Pain Management:    Induction: Intravenous  PONV Risk Score and Plan: 4 or greater and Ondansetron, Dexamethasone, Midazolam and Scopolamine patch - Pre-op  Airway Management Planned: Oral ETT  Additional Equipment: None  Intra-op Plan:   Post-operative Plan: Extubation in OR  Informed Consent: I have reviewed the patients History and Physical, chart, labs and discussed the procedure including the risks, benefits and alternatives for the proposed anesthesia with the patient or  authorized representative who has indicated his/her understanding and acceptance.   Dental advisory given  Plan Discussed with: CRNA  Anesthesia Plan Comments: (Pt unable to remove tongue piercing, risks explained in detail. )      Anesthesia Quick Evaluation

## 2017-12-02 NOTE — Transfer of Care (Signed)
Immediate Anesthesia Transfer of Care Note  Patient: [name]  Procedure(s) Performed: LAPAROSCOPIC EXCISION OF MESH ERAS PATHWAY (N/A Abdomen)  Patient Location: PACU  Anesthesia Type:General  Level of Consciousness: sedated  Airway & Oxygen Therapy: Patient Spontanous Breathing and Patient connected to face mask oxygen  Post-op Assessment: Report given to RN and Post -op Vital signs reviewed and stable  Post vital signs: Reviewed and stable  Last Vitals:  Vitals Value Taken Time  BP 140/90 12/02/2017  3:45 PM  Temp 37 C 12/02/2017  3:43 PM  Pulse 95 12/02/2017  3:45 PM  Resp 18 12/02/2017  3:45 PM  SpO2      Last Pain:  Vitals:   12/02/17 1330  TempSrc:   PainSc: 9       Patients Stated Pain Goal: 6 (12/02/17 1330)  Complications: No apparent anesthesia complications

## 2017-12-03 ENCOUNTER — Encounter (HOSPITAL_COMMUNITY): Payer: Self-pay | Admitting: General Surgery

## 2017-12-03 DIAGNOSIS — T85848A Pain due to other internal prosthetic devices, implants and grafts, initial encounter: Secondary | ICD-10-CM | POA: Diagnosis not present

## 2017-12-03 MED ORDER — POLYETHYLENE GLYCOL 3350 17 G PO PACK: 17.0000 g | PACK | Freq: Every day | ORAL | Status: DC

## 2017-12-03 MED ORDER — IBUPROFEN 800 MG PO TABS: 800.0000 mg | ORAL_TABLET | Freq: Three times a day (TID) | ORAL | 0 refills | Status: DC | PRN

## 2017-12-03 MED ORDER — GABAPENTIN 300 MG PO CAPS: 300.0000 mg | ORAL_CAPSULE | Freq: Two times a day (BID) | ORAL | 0 refills | Status: DC

## 2017-12-03 MED ORDER — OXYCODONE HCL 5 MG PO TABS: 5.0000 mg | ORAL_TABLET | Freq: Four times a day (QID) | ORAL | 0 refills | Status: DC | PRN

## 2017-12-03 MED ORDER — POLYETHYLENE GLYCOL 3350 17 G PO PACK: 17.0000 g | PACK | Freq: Every day | ORAL | 0 refills | Status: DC

## 2017-12-03 NOTE — Anesthesia Postprocedure Evaluation (Signed)
Anesthesia Post Note  Patient: [name]  Procedure(Crane) Performed: LAPAROSCOPIC EXCISION OF MESH ERAS PATHWAY (N/A Abdomen)     Patient location during evaluation: PACU Anesthesia Type: General Level of consciousness: awake and alert Pain management: pain level controlled Vital Signs Assessment: post-procedure vital signs reviewed and stable Respiratory status: spontaneous breathing, nonlabored ventilation, respiratory function stable and patient connected to nasal cannula oxygen Cardiovascular status: blood pressure returned to baseline and stable Postop Assessment: no apparent nausea or vomiting Anesthetic complications: no    Last Vitals:  Vitals:   12/03/17 0127 12/03/17 0639  BP: 104/62 115/74  Pulse: 61 77  Resp: 16 16  Temp: 36.9 C 36.9 C  SpO2: 100% 100%    Last Pain:  Vitals:   12/03/17 0639  TempSrc: Oral  PainSc:                  [name]

## 2017-12-03 NOTE — Discharge Summary (Signed)
Physician Discharge Summary  Patient ID: [name] MRN: [id number] DOB/AGE: [birth year] 38 y.o.  Admit date: 12/02/2017 Discharge date: 12/03/2017  Admission Diagnoses:  Discharge Diagnoses:  Active Problems:   Abdominal pain   Discharged Condition: good  Hospital Course: 37 yo female underwent lap mesh removal for recalcitrant pain.   Consults: None  Significant Diagnostic Studies:  CBC    Component Value Date/Time   WBC 15.2 (H) 12/02/2017 1754   RBC 4.31 12/02/2017 1754   HGB 12.6 12/02/2017 1754   HGB 10.0 (L) 02/07/2017 1010   HGB 11.4 10/31/2014   HCT 37.3 12/02/2017 1754   HCT 30.4 (L) 02/07/2017 1010   HCT 36 10/31/2014   PLT 217 12/02/2017 1754   PLT 211 02/07/2017 1010   PLT 177 10/31/2014   MCV 86.5 12/02/2017 1754   MCV 86 02/07/2017 1010   MCH 29.2 12/02/2017 1754   MCHC 33.8 12/02/2017 1754   RDW 14.2 12/02/2017 1754   RDW 17.3 (H) 02/07/2017 1010   LYMPHSABS 2.0 09/19/2016 0835   MONOABS 0.8 02/07/2016 1638   EOSABS 0.1 09/19/2016 0835   BASOSABS 0.0 09/19/2016 0835     Treatments: IV hydration  Discharge Exam: Blood pressure 115/74, pulse 77, temperature 98.4 F (36.9 C), temperature source Oral, resp. rate 16, height 5\' 3"  (1.6 m), weight 64 kg, SpO2 100 %, not currently breastfeeding. General appearance: alert and cooperative Cardio: regular rate and rhythm, S1, S2 normal, no murmur, click, rub or gallop GI: incisions c/d/i, no periumilical tenderness, some suprapubic tenderness  Disposition: Discharge disposition: 01-Home or Self Care       Discharge Instructions    Call MD for:  difficulty breathing, headache or visual disturbances   Complete by:  As directed    Call MD for:  persistant nausea and vomiting   Complete by:  As directed    Call MD for:  redness, tenderness, or signs of infection (pain, swelling, redness, odor or green/yellow discharge around incision site)   Complete by:  As directed    Call MD for:  severe  uncontrolled pain   Complete by:  As directed    Call MD for:  temperature >100.4   Complete by:  As directed    Diet - low sodium heart healthy   Complete by:  As directed    Discharge wound care:   Complete by:  As directed    Remove bandaids tomorrow. Ok to shower tomorrow  Steristrips will likely peel off in 1-3 weeks   Increase activity slowly   Complete by:  As directed    Lifting restrictions   Complete by:  As directed    Do not lift more than 20 pounds for 3-4 weeks     Allergies as of 12/03/2017      Reactions   Peanut-containing Drug Products Anaphylaxis, Itching, Rash   Amoxicillin Hives, Swelling, Other (See Comments)   Has patient had a PCN reaction causing immediate rash, facial/tongue/throat swelling, SOB or lightheadedness with hypotension: No Has patient had a PCN reaction causing severe rash involving mucus membranes or skin necrosis: No Has patient had a PCN reaction that required hospitalization No Has patient had a PCN reaction occurring within the last 10 years: Yes If all of the above answers are "NO", then may proceed with Cephalosporin use.   Latex Hives, Itching      Medication List    STOP taking these medications   docusate sodium 100 MG capsule Commonly known as:  COLACE  HYDROcodone-acetaminophen 5-325 MG tablet Commonly known as:  NORCO/VICODIN   ondansetron 4 MG disintegrating tablet Commonly known as:  ZOFRAN-ODT   oxyCODONE-acetaminophen 5-325 MG tablet Commonly known as:  PERCOCET/ROXICET   PREPLUS 27-1 MG Tabs   traMADol 50 MG tablet Commonly known as:  ULTRAM     TAKE these medications   gabapentin 300 MG capsule Commonly known as:  NEURONTIN Take 1 capsule (300 mg total) by mouth 2 (two) times daily.   ibuprofen 800 MG tablet Commonly known as:  ADVIL,MOTRIN Take 1 tablet (800 mg total) by mouth every 8 (eight) hours as needed. What changed:    medication strength  how much to take  when to take this  reasons to  take this   omeprazole 20 MG tablet Commonly known as:  PRILOSEC OTC Take 1 tablet (20 mg total) daily by mouth.   oxyCODONE 5 MG immediate release tablet Commonly known as:  Oxy IR/ROXICODONE Take 1 tablet (5 mg total) by mouth every 6 (six) hours as needed for up to 30 doses for severe pain.   polyethylene glycol packet Commonly known as:  MIRALAX / GLYCOLAX Take 17 g by mouth daily.            Discharge Care Instructions  (From admission, onward)         Start     Ordered   12/03/17 0000  Discharge wound care:    Comments:  Remove bandaids tomorrow. Ok to shower tomorrow  Steristrips will likely peel off in 1-3 weeks   12/03/17 0826           Signed: De Blanch Kinsinger 12/03/2017, 8:30 AM

## 2017-12-03 NOTE — Progress Notes (Signed)
Discharge instructions given to patient, all questions answered, patient was taken to main entrance via wheelchair.

## 2017-12-23 ENCOUNTER — Other Ambulatory Visit: Payer: Self-pay | Admitting: General Surgery

## 2017-12-23 DIAGNOSIS — K429 Umbilical hernia without obstruction or gangrene: Secondary | ICD-10-CM

## 2017-12-24 ENCOUNTER — Inpatient Hospital Stay
Admission: RE | Admit: 2017-12-24 | Discharge: 2017-12-24 | Disposition: A | Discharge status: Home / Self Care | Payer: Medicaid Other | Source: Ambulatory Visit | Attending: General Surgery | Admitting: General Surgery

## 2017-12-26 ENCOUNTER — Encounter (HOSPITAL_COMMUNITY): Payer: Self-pay | Admitting: *Deleted

## 2017-12-26 ENCOUNTER — Emergency Department (HOSPITAL_COMMUNITY)
Admission: EM | Admit: 2017-12-26 | Discharge: 2017-12-26 | Disposition: A | Discharge status: Home / Self Care | Payer: Medicaid Other | Attending: Emergency Medicine | Admitting: Emergency Medicine

## 2017-12-26 ENCOUNTER — Emergency Department (HOSPITAL_COMMUNITY): Payer: Medicaid Other

## 2017-12-26 DIAGNOSIS — I10 Essential (primary) hypertension: Secondary | ICD-10-CM | POA: Insufficient documentation

## 2017-12-26 DIAGNOSIS — Z87891 Personal history of nicotine dependence: Secondary | ICD-10-CM | POA: Diagnosis not present

## 2017-12-26 DIAGNOSIS — Z79899 Other long term (current) drug therapy: Secondary | ICD-10-CM | POA: Diagnosis not present

## 2017-12-26 DIAGNOSIS — L03313 Cellulitis of chest wall: Secondary | ICD-10-CM | POA: Diagnosis not present

## 2017-12-26 DIAGNOSIS — L03112 Cellulitis of left axilla: Secondary | ICD-10-CM | POA: Diagnosis not present

## 2017-12-26 DIAGNOSIS — L02412 Cutaneous abscess of left axilla: Secondary | ICD-10-CM | POA: Diagnosis present

## 2017-12-26 LAB — COMPREHENSIVE METABOLIC PANEL
ALBUMIN: 3.5 g/dL (ref 3.5–5.0)
ALT: 31 U/L (ref 0–44)
AST: 28 U/L (ref 15–41)
Alkaline Phosphatase: 86 U/L (ref 38–126)
Anion gap: 9 (ref 5–15)
BUN: 5 mg/dL — AB (ref 6–20)
CHLORIDE: 105 mmol/L (ref 98–111)
CO2: 23 mmol/L (ref 22–32)
CREATININE: 0.67 mg/dL (ref 0.44–1.00)
Calcium: 8.6 mg/dL — ABNORMAL LOW (ref 8.9–10.3)
GFR calc Af Amer: >60 mL/min (ref >60)
GLUCOSE: 97 mg/dL (ref 70–99)
POTASSIUM: 3.6 mmol/L (ref 3.5–5.1)
SODIUM: 137 mmol/L (ref 135–145)
TOTAL PROTEIN: 6.2 g/dL — AB (ref 6.5–8.1)
Total Bilirubin: 1.5 mg/dL — ABNORMAL HIGH (ref 0.3–1.2)

## 2017-12-26 LAB — CBC WITH DIFFERENTIAL/PLATELET
Abs Immature Granulocytes: 0.1 10*3/uL (ref 0.0–0.1)
Basophils Absolute: 0 10*3/uL (ref 0.0–0.1)
Basophils Relative: 0 %
EOS PCT: 0 %
Eosinophils Absolute: 0 10*3/uL (ref 0.0–0.7)
HEMATOCRIT: 34.8 % — AB (ref 36.0–46.0)
HEMOGLOBIN: 11 g/dL — AB (ref 12.0–15.0)
Immature Granulocytes: 0 %
LYMPHS ABS: 1.4 10*3/uL (ref 0.7–4.0)
LYMPHS PCT: 8 %
MCH: 28.9 pg (ref 26.0–34.0)
MCHC: 31.6 g/dL (ref 30.0–36.0)
MCV: 91.6 fL (ref 78.0–100.0)
MONO ABS: 1.2 10*3/uL — AB (ref 0.1–1.0)
Monocytes Relative: 7 %
Neutro Abs: 15.1 10*3/uL — ABNORMAL HIGH (ref 1.7–7.7)
Neutrophils Relative %: 85 %
Platelets: 269 10*3/uL (ref 150–400)
RBC: 3.8 MIL/uL — ABNORMAL LOW (ref 3.87–5.11)
RDW: 15 % (ref 11.5–15.5)
WBC: 17.9 10*3/uL — ABNORMAL HIGH (ref 4.0–10.5)

## 2017-12-26 LAB — I-STAT BETA HCG BLOOD, ED (MC, WL, AP ONLY): I-stat hCG, quantitative: 9.5 m[IU]/mL — ABNORMAL HIGH (ref <5)

## 2017-12-26 LAB — URINALYSIS, ROUTINE W REFLEX MICROSCOPIC
BILIRUBIN URINE: NEGATIVE
GLUCOSE, UA: NEGATIVE mg/dL
HGB URINE DIPSTICK: NEGATIVE
Ketones, ur: NEGATIVE mg/dL
Leukocytes, UA: NEGATIVE
Nitrite: NEGATIVE
PH: 6 (ref 5.0–8.0)
Protein, ur: NEGATIVE mg/dL
SPECIFIC GRAVITY, URINE: 1.021 (ref 1.005–1.030)

## 2017-12-26 LAB — I-STAT CG4 LACTIC ACID, ED: LACTIC ACID, VENOUS: 0.61 mmol/L (ref 0.5–1.9)

## 2017-12-26 MED ORDER — ACETAMINOPHEN 325 MG PO TABS
650.0000 mg | ORAL_TABLET | Freq: Once | ORAL | Status: AC
  Administered 2017-12-26: 650 mg via ORAL
  Filled 2017-12-26: qty 2

## 2017-12-26 MED ORDER — ONDANSETRON HCL 4 MG/2ML IJ SOLN
4.0000 mg | Freq: Once | INTRAMUSCULAR | Status: AC
  Administered 2017-12-26: 4 mg via INTRAVENOUS
  Filled 2017-12-26: qty 2

## 2017-12-26 MED ORDER — CLINDAMYCIN PHOSPHATE 600 MG/50ML IV SOLN
600.0000 mg | Freq: Once | INTRAVENOUS | Status: AC
  Administered 2017-12-26: 600 mg via INTRAVENOUS
  Filled 2017-12-26: qty 50

## 2017-12-26 MED ORDER — FENTANYL CITRATE (PF) 100 MCG/2ML IJ SOLN
50.0000 ug | Freq: Once | INTRAMUSCULAR | Status: AC
  Administered 2017-12-26: 50 ug via INTRAVENOUS
  Filled 2017-12-26: qty 2

## 2017-12-26 MED ORDER — MORPHINE SULFATE (PF) 4 MG/ML IV SOLN
4.0000 mg | Freq: Once | INTRAVENOUS | Status: AC
  Administered 2017-12-26: 4 mg via INTRAVENOUS
  Filled 2017-12-26: qty 1

## 2017-12-26 MED ORDER — OXYCODONE-ACETAMINOPHEN 5-325 MG PO TABS: 1.0000 | ORAL_TABLET | Freq: Three times a day (TID) | ORAL | 0 refills | Status: DC | PRN

## 2017-12-26 MED ORDER — IOHEXOL 300 MG/ML  SOLN
75.0000 mL | Freq: Once | INTRAMUSCULAR | Status: AC | PRN
  Administered 2017-12-26: 75 mL via INTRAVENOUS

## 2017-12-26 MED ORDER — OXYCODONE-ACETAMINOPHEN 5-325 MG PO TABS
1.0000 | ORAL_TABLET | Freq: Once | ORAL | Status: AC
  Administered 2017-12-26: 1 via ORAL
  Filled 2017-12-26: qty 1

## 2017-12-26 MED ORDER — DOXYCYCLINE HYCLATE 100 MG PO TABS
100.0000 mg | ORAL_TABLET | Freq: Once | ORAL | Status: AC
  Administered 2017-12-26: 100 mg via ORAL
  Filled 2017-12-26: qty 1

## 2017-12-26 MED ORDER — CLINDAMYCIN HCL 150 MG PO CAPS: 450.0000 mg | ORAL_CAPSULE | Freq: Three times a day (TID) | ORAL | 0 refills | Status: DC

## 2017-12-26 MED ORDER — SODIUM CHLORIDE 0.9 % IV BOLUS
1000.0000 mL | Freq: Once | INTRAVENOUS | Status: AC
  Administered 2017-12-26: 1000 mL via INTRAVENOUS

## 2017-12-26 NOTE — ED Triage Notes (Signed)
EMS report:  Pt presents with L axilla pain and 102 fever; reports nausea;  HR 100, BP 134/90; symptoms began last night.

## 2017-12-26 NOTE — ED Provider Notes (Signed)
[facility] EMERGENCY DEPARTMENT Provider Note   CSN: [id number] Arrival date & time: 12/26/17  1310     History   Chief Complaint Chief Complaint  Patient presents with  . Abscess  . Emesis    HPI [name] is a 37 y.o. female with a past medical history of substance abuse, bipolar disorder, hypertension presents to ED for evaluation of 5-day history of left axilla pain, nausea, one episode of nonbloody, nonbilious emesis today.  States that she is unsure what caused the induration in her left axilla.  Denies history of MRSA infection or abscesses in the past.  States that she was febrile to 102 orally this past week.  Denies any shortness of breath, chest pain, history of DVT, abdominal pain.  HPI  Past Medical History:  Diagnosis Date  . Anemia   . Bipolar 1 disorder (HCC)   . Chlamydia   . Chronic abdominal pain   . Chronic nausea   . Deliberate self-cutting   . History of substance abuse   . HSV-2 infection 2015  . Hypertension   . Infection    MRSA in 1995, negative since  . Schizophrenia (HCC)   . Seizures (HCC)    Not recently  . Sickle cell trait (HCC)   . Trichomonas infection     Patient Active Problem List   Diagnosis Date Noted  . Abdominal pain 12/02/2017  . Back pain affecting pregnancy in third trimester 03/20/2017  . Labor, premature with delivery 03/20/2017  . Preterm labor in third trimester 03/03/2017  . Chronic hypertension in pregnancy 02/21/2017  . GERD (gastroesophageal reflux disease) 02/21/2017  . Umbilical hernia 01/13/2017  . Hereditary disease in family possibly affecting fetus, fetus 1   . Penicillin allergy 10/02/2016  . Opiate use 09/25/2016  . Supervision of high risk pregnancy, antepartum 09/19/2016  . History of herpes genitalis 09/19/2016  . History of trichomoniasis 09/19/2016  . Advanced maternal age in multigravida, second trimester 09/19/2016  . Grand multiparity 09/19/2016  . Short interval  between pregnancies affecting pregnancy in first trimester, antepartum 09/19/2016  . Dichorionic diamniotic twin gestation 09/19/2016  . Chronic hypertension 07/03/2016  . Tobacco abuse 12/19/2014  . Bipolar disorder (HCC) 12/19/2014  . History of substance abuse 12/19/2014  . Sickle cell trait (HCC) 12/19/2014    Past Surgical History:  Procedure Laterality Date  . CESAREAN SECTION N/A 03/21/2017   Procedure: CESAREAN SECTION;  Surgeon: [place] Bing, MD;  Location: [facility] BIRTHING SUITES;  Service: Obstetrics;  Laterality: N/A;  . DILATION AND CURETTAGE OF UTERUS    . HEMORROIDECTOMY  2010  . plastic surgery on face    . UMBILICAL HERNIA REPAIR N/A 12/02/2017   Procedure: LAPAROSCOPIC EXCISION OF MESH ERAS PATHWAY;  Surgeon: Kinsinger, De Blanch, MD;  Location: [facility] ORS;  Service: General;  Laterality: N/A;     OB History    Gravida  15   Para  9   Term  8   Preterm  1   AB  6   Living  10     SAB  6   TAB  0   Ectopic  0   Multiple  1   Live Births  10            Home Medications    Prior to Admission medications   Medication Sig Start Date End Date Taking? Authorizing Provider  clindamycin (CLEOCIN) 150 MG capsule Take 3 capsules (450 mg total) by mouth 3 (three)  times daily for 7 days. 12/26/17 01/02/18  Tyton Abdallah, PA-C  gabapentin (NEURONTIN) 300 MG capsule Take 1 capsule (300 mg total) by mouth 2 (two) times daily. Patient not taking: Reported on 12/26/2017 12/03/17   Kinsinger, De BlanchLuke Aaron, MD  ibuprofen (ADVIL,MOTRIN) 800 MG tablet Take 1 tablet (800 mg total) by mouth every 8 (eight) hours as needed. Patient not taking: Reported on 12/26/2017 12/03/17   Kinsinger, De BlanchLuke Aaron, MD  omeprazole (PRILOSEC OTC) 20 MG tablet Take 1 tablet (20 mg total) daily by mouth. Patient not taking: Reported on 08/20/2017 02/21/17   Reva BoresPratt, Tanya S, MD  oxyCODONE (OXY IR/ROXICODONE) 5 MG immediate release tablet Take 1 tablet (5 mg total) by mouth every 6 (six) hours as  needed for up to 30 doses for severe pain. Patient not taking: Reported on 12/26/2017 12/03/17   Kinsinger, De BlanchLuke Aaron, MD  oxyCODONE-acetaminophen (PERCOCET/ROXICET) 5-325 MG tablet Take 1 tablet by mouth every 8 (eight) hours as needed for severe pain. 12/26/17   Martine Trageser, PA-C  polyethylene glycol (MIRALAX / GLYCOLAX) packet Take 17 g by mouth daily. Patient not taking: Reported on 12/26/2017 12/03/17   Kinsinger, De BlanchLuke Aaron, MD    Family History Family History  Problem Relation Age of Onset  . Cancer Mother   . Heart failure Mother   . Hypertension Mother   . Stroke Mother   . Diabetes Mother   . Hypertension Maternal Grandmother   . Anesthesia problems Neg Hx     Social History Social History   Tobacco Use  . Smoking status: Former Smoker    Packs/day: 0.50    Years: 1.50    Pack years: 0.75    Types: Cigarettes    Last attempt to quit: 04/09/2015    Years since quitting: 2.7  . Smokeless tobacco: Never Used  Substance Use Topics  . Alcohol use: No    Comment: hx drug use  . Drug use: No    Types: Marijuana, Cocaine    Comment: past use, 4 years ago     Allergies   Peanut-containing drug products; Amoxicillin; and Latex   Review of Systems Review of Systems  Constitutional: Positive for chills and fever. Negative for appetite change.  HENT: Negative for ear pain, rhinorrhea, sneezing and sore throat.   Eyes: Negative for photophobia and visual disturbance.  Respiratory: Negative for cough, chest tightness, shortness of breath and wheezing.   Cardiovascular: Negative for chest pain and palpitations.  Gastrointestinal: Negative for abdominal pain, blood in stool, constipation, diarrhea, nausea and vomiting.  Genitourinary: Negative for dysuria, hematuria and urgency.  Musculoskeletal: Negative for myalgias.  Skin: Positive for wound. Negative for rash.  Neurological: Negative for dizziness, weakness and light-headedness.     Physical Exam Updated Vital  Signs BP 127/84   Pulse 91   Temp 98.9 F (37.2 C) (Oral)   Resp 15   SpO2 100%   Physical Exam  Constitutional: She appears well-developed and well-nourished. No distress.  HENT:  Head: Normocephalic and atraumatic.  Nose: Nose normal.  Eyes: Conjunctivae and EOM are normal. Left eye exhibits no discharge. No scleral icterus.  Neck: Normal range of motion. Neck supple.  Cardiovascular: Normal rate, regular rhythm, normal heart sounds and intact distal pulses. Exam reveals no gallop and no friction rub.  No murmur heard. Pulmonary/Chest: Effort normal and breath sounds normal. No respiratory distress.  Abdominal: Soft. Bowel sounds are normal. She exhibits no distension. There is no tenderness. There is no guarding.  Musculoskeletal: Normal  range of motion. She exhibits no edema.  Neurological: She is alert. She exhibits normal muscle tone. Coordination normal.  Skin: Skin is warm and dry. No rash noted. No erythema.  Approximate 5 x 5 cm area of induration without central fluctuance noted of the left axilla.  No overlying erythema or streaking noted.  No warmth of area noted.  Mild tenderness to palpation extending to the left upper arm and left chest wall with no induration noted in these areas.  Psychiatric: She has a normal mood and affect.  Nursing note and vitals reviewed.    ED Treatments / Results  Labs (all labs ordered are listed, but only abnormal results are displayed) Labs Reviewed  COMPREHENSIVE METABOLIC PANEL - Abnormal; Notable for the following components:      Result Value   BUN 5 (*)    Calcium 8.6 (*)    Total Protein 6.2 (*)    Total Bilirubin 1.5 (*)    All other components within normal limits  CBC WITH DIFFERENTIAL/PLATELET - Abnormal; Notable for the following components:   WBC 17.9 (*)    RBC 3.80 (*)    Hemoglobin 11.0 (*)    HCT 34.8 (*)    Neutro Abs 15.1 (*)    Monocytes Absolute 1.2 (*)    All other components within normal limits    URINALYSIS, ROUTINE W REFLEX MICROSCOPIC - Abnormal; Notable for the following components:   Color, Urine AMBER (*)    All other components within normal limits  I-STAT BETA HCG BLOOD, ED ([facility], [facility], [facility] ONLY) - Abnormal; Notable for the following components:   I-stat hCG, quantitative 9.5 (*)    All other components within normal limits  I-STAT CG4 LACTIC ACID, ED  I-STAT CG4 LACTIC ACID, ED    EKG None  Radiology Ct Chest W Contrast  Result Date: 12/26/2017 CLINICAL DATA:  LEFT axillary pain, fever and nausea. History of substance abuse. EXAM: CT CHEST WITH CONTRAST TECHNIQUE: Multidetector CT imaging of the chest was performed during intravenous contrast administration. CONTRAST:  75mL OMNIPAQUE IOHEXOL 300 MG/ML  SOLN COMPARISON:  Chest radiograph Aug 16, 2014 FINDINGS: CARDIOVASCULAR: Heart and pericardium are unremarkable. Thoracic aorta is normal course and caliber, unremarkable. MEDIASTINUM/NODES: No mediastinal mass. No lymphadenopathy by CT size criteria. Normal appearance of thoracic esophagus though not tailored for evaluation. LUNGS/PLEURA: Tracheobronchial tree is patent, no pneumothorax. No pleural effusions, focal consolidations, pulmonary nodules or masses. UPPER ABDOMEN: Nonacute. 22 mm simple cyst RIGHT lobe of the liver, 11 mm cyst pole cyst segment 4. MUSCULOSKELETAL: Focal subcentimeter nodular thickening RIGHT hemidiaphragm. LEFT chest wall edema extending to the retro pectoral fat, associated skin thickening without focal fluid collection, subcutaneous gas or radiopaque foreign bodies. Preservation of the intramuscular fat planes. IMPRESSION: 1. LEFT axillary and chest wall cellulitis without abscess. 2. No acute cardiopulmonary process. Electronically Signed   By: Awilda Metro M.D.   On: 12/26/2017 20:35    Procedures Procedures (including critical care time)  Medications Ordered in ED Medications  clindamycin (CLEOCIN) IVPB 600 mg (600 mg Intravenous New Bag/Given  12/26/17 2102)  acetaminophen (TYLENOL) tablet 650 mg (650 mg Oral Given 12/26/17 1744)  sodium chloride 0.9 % bolus 1,000 mL (0 mLs Intravenous Stopped 12/26/17 1848)  fentaNYL (SUBLIMAZE) injection 50 mcg (50 mcg Intravenous Given 12/26/17 1744)  ondansetron (ZOFRAN) injection 4 mg (4 mg Intravenous Given 12/26/17 1753)  doxycycline (VIBRA-TABS) tablet 100 mg (100 mg Oral Given 12/26/17 1905)  morphine 4 MG/ML injection 4 mg (4  mg Intravenous Given 12/26/17 1905)  iohexol (OMNIPAQUE) 300 MG/ML solution 75 mL (75 mLs Intravenous Contrast Given 12/26/17 2004)  oxyCODONE-acetaminophen (PERCOCET/ROXICET) 5-325 MG per tablet 1 tablet (1 tablet Oral Given 12/26/17 2124)     Initial Impression / Assessment and Plan / ED Course  I have reviewed the triage vital signs and the nursing notes.  Pertinent labs & imaging results that were available during my care of the patient were reviewed by me and considered in my medical decision making (see chart for details).     37 year old female with a past medical history of polysubstance abuse presents for 5-day history of left axilla pain, fever, nausea.  Denies history of abscesses in the past.  Cannot recall any inciting event that may have triggered the symptoms.  On arrival here patient appears uncomfortable with palpation of the left axilla.  There was a somewhat large area of induration noted but no specific fluctuance that I could palpate.  Ultrasound revealed no definitive abscess.  CT of the chest was done which included this area and showed cellulitis with no abscess.  Lab work shows leukocytosis at 17, lactic unremarkable.  hCG is 9.5 the patient is not pregnant.  CMP unremarkable.  UA negative.  Patient was given IV antibiotics and will be discharged home with remainder of course.  Give short course of pain medication as well.  Encouraged her to complete the entire course of antibiotics.  At this time I do not feel that incision and drainage is warranted as  there is no abscess noted on imaging.  Patient is agreeable to plan and comfortable with discharge home.  Fever improved here in the ED.  Vital signs remain stable.  Advised to return to ED for any severe worsening symptoms. [place] PMP reviewed with no discrepancies.  Portions of this note were generated with Scientist, clinical (histocompatibility and immunogenetics). Dictation errors may occur despite best attempts at proofreading.   Final Clinical Impressions(s) / ED Diagnoses   Final diagnoses:  Cellulitis of left axilla    ED Discharge Orders         Ordered    clindamycin (CLEOCIN) 150 MG capsule  3 times daily     12/26/17 2128    oxyCODONE-acetaminophen (PERCOCET/ROXICET) 5-325 MG tablet  Every 8 hours PRN     12/26/17 2128           Dietrich Pates, PA-C 12/26/17 2128    Mancel Bale, MD 12/28/17 [id number]

## 2017-12-26 NOTE — Discharge Instructions (Addendum)
Return to ED for worsening symptoms, increased swelling, increased fevers, chest pain or shortness of breath.

## 2017-12-28 ENCOUNTER — Encounter (HOSPITAL_COMMUNITY): Payer: Self-pay | Admitting: Emergency Medicine

## 2017-12-28 ENCOUNTER — Other Ambulatory Visit: Payer: Self-pay

## 2017-12-28 ENCOUNTER — Emergency Department (HOSPITAL_COMMUNITY)
Admission: EM | Admit: 2017-12-28 | Discharge: 2017-12-28 | Disposition: A | Discharge status: Home / Self Care | Payer: Medicaid Other | Attending: Emergency Medicine | Admitting: Emergency Medicine

## 2017-12-28 DIAGNOSIS — Z87891 Personal history of nicotine dependence: Secondary | ICD-10-CM | POA: Diagnosis not present

## 2017-12-28 DIAGNOSIS — R109 Unspecified abdominal pain: Secondary | ICD-10-CM | POA: Insufficient documentation

## 2017-12-28 DIAGNOSIS — I10 Essential (primary) hypertension: Secondary | ICD-10-CM | POA: Diagnosis not present

## 2017-12-28 DIAGNOSIS — Z91040 Latex allergy status: Secondary | ICD-10-CM | POA: Diagnosis not present

## 2017-12-28 DIAGNOSIS — R300 Dysuria: Secondary | ICD-10-CM | POA: Insufficient documentation

## 2017-12-28 DIAGNOSIS — R110 Nausea: Secondary | ICD-10-CM | POA: Insufficient documentation

## 2017-12-28 DIAGNOSIS — Z880 Allergy status to penicillin: Secondary | ICD-10-CM | POA: Diagnosis not present

## 2017-12-28 DIAGNOSIS — Z79899 Other long term (current) drug therapy: Secondary | ICD-10-CM | POA: Insufficient documentation

## 2017-12-28 DIAGNOSIS — R509 Fever, unspecified: Secondary | ICD-10-CM | POA: Diagnosis not present

## 2017-12-28 DIAGNOSIS — L03112 Cellulitis of left axilla: Secondary | ICD-10-CM

## 2017-12-28 LAB — URINALYSIS, ROUTINE W REFLEX MICROSCOPIC
BILIRUBIN URINE: NEGATIVE
Glucose, UA: NEGATIVE mg/dL
Hgb urine dipstick: NEGATIVE
Ketones, ur: 5 mg/dL — AB
Nitrite: NEGATIVE
Protein, ur: 30 mg/dL — AB
SPECIFIC GRAVITY, URINE: 1.025 (ref 1.005–1.030)
pH: 6 (ref 5.0–8.0)

## 2017-12-28 LAB — CBC
HCT: 32.2 % — ABNORMAL LOW (ref 36.0–46.0)
HEMOGLOBIN: 10.9 g/dL — AB (ref 12.0–15.0)
MCH: 30.1 pg (ref 26.0–34.0)
MCHC: 33.9 g/dL (ref 30.0–36.0)
MCV: 89 fL (ref 78.0–100.0)
PLATELETS: 272 10*3/uL (ref 150–400)
RBC: 3.62 MIL/uL — AB (ref 3.87–5.11)
RDW: 14.9 % (ref 11.5–15.5)
WBC: 10.4 10*3/uL (ref 4.0–10.5)

## 2017-12-28 LAB — BASIC METABOLIC PANEL
ANION GAP: 9 (ref 5–15)
BUN: 7 mg/dL (ref 6–20)
CHLORIDE: 106 mmol/L (ref 98–111)
CO2: 27 mmol/L (ref 22–32)
CREATININE: 0.7 mg/dL (ref 0.44–1.00)
Calcium: 9.3 mg/dL (ref 8.9–10.3)
GFR calc non Af Amer: >60 mL/min (ref >60)
Glucose, Bld: 91 mg/dL (ref 70–99)
POTASSIUM: 3.5 mmol/L (ref 3.5–5.1)
SODIUM: 142 mmol/L (ref 135–145)

## 2017-12-28 LAB — PREGNANCY, URINE: Preg Test, Ur: NEGATIVE

## 2017-12-28 MED ORDER — NAPROXEN 500 MG PO TABS: 500.0000 mg | ORAL_TABLET | Freq: Two times a day (BID) | ORAL | 0 refills | Status: DC

## 2017-12-28 MED ORDER — CLINDAMYCIN HCL 300 MG PO CAPS
450.0000 mg | ORAL_CAPSULE | Freq: Once | ORAL | Status: DC
  Filled 2017-12-28: qty 1

## 2017-12-28 MED ORDER — ONDANSETRON 4 MG PO TBDP
4.0000 mg | ORAL_TABLET | Freq: Once | ORAL | Status: AC
  Administered 2017-12-28: 4 mg via ORAL
  Filled 2017-12-28: qty 1

## 2017-12-28 MED ORDER — PROMETHAZINE HCL 25 MG PO TABS
25.0000 mg | ORAL_TABLET | Freq: Once | ORAL | Status: AC
  Administered 2017-12-28: 25 mg via ORAL
  Filled 2017-12-28: qty 1

## 2017-12-28 MED ORDER — HYDROMORPHONE HCL 1 MG/ML IJ SOLN
1.0000 mg | Freq: Once | INTRAMUSCULAR | Status: AC
  Administered 2017-12-28: 1 mg via INTRAMUSCULAR
  Filled 2017-12-28: qty 1

## 2017-12-28 NOTE — Discharge Instructions (Addendum)
Continue your current antibiotic.  I gave you full-strength Naprosyn to help with the pain and inflammation.  Follow-up with the wellness clinic or the health department in the next few days.  If unable to be seen by them could follow-up with [facility] urgent care.  Return for any new or worse symptoms.  Today's labs show some improvement.  No evidence of abscess clinically as of today but one could develop in the future.  In addition as we discussed her urine was questionable contamination or early urinary tract infection.  Culture sent.  You will be notified if the culture is positive and not responding to the antibiotic you are currently on.

## 2017-12-28 NOTE — ED Notes (Signed)
This RN came into the room with antibiotic and discharge paperwork. The pt had already left. Pt was upset that she would not be getting anymore narcotic pain medicine.

## 2017-12-28 NOTE — ED Provider Notes (Addendum)
[facility]-EMERGENCY DEPT Provider Note   CSN: [id number] Arrival date & time: 12/28/17  1313     History   Chief Complaint Chief Complaint  Patient presents with  . Fever  . Cellulitis  . Dysuria    HPI [name] is a 37 y.o. female.  Patient seen in the emergency department on Friday.  For pain in the left axillary area.  Had CT scan the chest done to rule out abscess which is showed induration and cellulitis consistent see.  Patient's white count time was 17,000 patient was having fevers.  Patient received some IV antibiotics and was discharged home on clindamycin.  Patient has a past history of substance abuse.  Patient discharged home on oxycodone as well.  With the clindamycin.  Patient states that it is not any better.  States she had a fever earlier.  Patient without fever upon arrival here.  Patient also with complaint of some dysuria.     Past Medical History:  Diagnosis Date  . Anemia   . Bipolar 1 disorder (HCC)   . Chlamydia   . Chronic abdominal pain   . Chronic nausea   . Deliberate self-cutting   . History of substance abuse   . HSV-2 infection 2015  . Hypertension   . Infection    MRSA in 1995, negative since  . Schizophrenia (HCC)   . Seizures (HCC)    Not recently  . Sickle cell trait (HCC)   . Trichomonas infection     Patient Active Problem List   Diagnosis Date Noted  . Abdominal pain 12/02/2017  . Back pain affecting pregnancy in third trimester 03/20/2017  . Labor, premature with delivery 03/20/2017  . Preterm labor in third trimester 03/03/2017  . Chronic hypertension in pregnancy 02/21/2017  . GERD (gastroesophageal reflux disease) 02/21/2017  . Umbilical hernia 01/13/2017  . Hereditary disease in family possibly affecting fetus, fetus 1   . Penicillin allergy 10/02/2016  . Opiate use 09/25/2016  . Supervision of high risk pregnancy, antepartum 09/19/2016  . History of herpes genitalis 09/19/2016  .  History of trichomoniasis 09/19/2016  . Advanced maternal age in multigravida, second trimester 09/19/2016  . Grand multiparity 09/19/2016  . Short interval between pregnancies affecting pregnancy in first trimester, antepartum 09/19/2016  . Dichorionic diamniotic twin gestation 09/19/2016  . Chronic hypertension 07/03/2016  . Tobacco abuse 12/19/2014  . Bipolar disorder (HCC) 12/19/2014  . History of substance abuse 12/19/2014  . Sickle cell trait (HCC) 12/19/2014    Past Surgical History:  Procedure Laterality Date  . CESAREAN SECTION N/A 03/21/2017   Procedure: CESAREAN SECTION;  Surgeon: Albin Bing, MD;  Location: [facility] BIRTHING SUITES;  Service: Obstetrics;  Laterality: N/A;  . DILATION AND CURETTAGE OF UTERUS    . HEMORROIDECTOMY  2010  . plastic surgery on face    . UMBILICAL HERNIA REPAIR N/A 12/02/2017   Procedure: LAPAROSCOPIC EXCISION OF MESH ERAS PATHWAY;  Surgeon: Kinsinger, De Blanch, MD;  Location: [facility] ORS;  Service: General;  Laterality: N/A;     OB History    Gravida  15   Para  9   Term  8   Preterm  1   AB  6   Living  10     SAB  6   TAB  0   Ectopic  0   Multiple  1   Live Births  10            Home Medications  Prior to Admission medications   Medication Sig Start Date End Date Taking? Authorizing Provider  clindamycin (CLEOCIN) 150 MG capsule Take 3 capsules (450 mg total) by mouth 3 (three) times daily for 7 days. 12/26/17 01/02/18 Yes Khatri, Hina, PA-C  oxyCODONE-acetaminophen (PERCOCET/ROXICET) 5-325 MG tablet Take 1 tablet by mouth every 8 (eight) hours as needed for severe pain. 12/26/17  Yes Khatri, Hina, PA-C  gabapentin (NEURONTIN) 300 MG capsule Take 1 capsule (300 mg total) by mouth 2 (two) times daily. Patient not taking: Reported on 12/26/2017 12/03/17   Kinsinger, De Blanch, MD  ibuprofen (ADVIL,MOTRIN) 800 MG tablet Take 1 tablet (800 mg total) by mouth every 8 (eight) hours as needed. Patient not taking: Reported on  12/26/2017 12/03/17   Kinsinger, De Blanch, MD  naproxen (NAPROSYN) 500 MG tablet Take 1 tablet (500 mg total) by mouth 2 (two) times daily. 12/28/17   Vanetta Mulders, MD  omeprazole (PRILOSEC OTC) 20 MG tablet Take 1 tablet (20 mg total) daily by mouth. Patient not taking: Reported on 08/20/2017 02/21/17   Reva Bores, MD  oxyCODONE (OXY IR/ROXICODONE) 5 MG immediate release tablet Take 1 tablet (5 mg total) by mouth every 6 (six) hours as needed for up to 30 doses for severe pain. Patient not taking: Reported on 12/28/2017 12/03/17   Kinsinger, De Blanch, MD  polyethylene glycol [facility] Hal) packet Take 17 g by mouth daily. Patient not taking: Reported on 12/26/2017 12/03/17   Kinsinger, De Blanch, MD    Family History Family History  Problem Relation Age of Onset  . Cancer Mother   . Heart failure Mother   . Hypertension Mother   . Stroke Mother   . Diabetes Mother   . Hypertension Maternal Grandmother   . Anesthesia problems Neg Hx     Social History Social History   Tobacco Use  . Smoking status: Former Smoker    Packs/day: 0.50    Years: 1.50    Pack years: 0.75    Types: Cigarettes    Last attempt to quit: 04/09/2015    Years since quitting: 2.7  . Smokeless tobacco: Never Used  Substance Use Topics  . Alcohol use: No    Comment: hx drug use  . Drug use: No    Types: Marijuana, Cocaine    Comment: past use, 4 years ago     Allergies   Peanut-containing drug products; Amoxicillin; and Latex   Review of Systems Review of Systems  Constitutional: Negative for fever.  HENT: Negative for congestion.   Eyes: Negative for redness.  Respiratory: Negative for shortness of breath.   Cardiovascular: Negative for chest pain.  Gastrointestinal: Positive for abdominal pain and nausea. Negative for vomiting.  Genitourinary: Positive for dysuria.  Musculoskeletal: Negative for back pain.  Skin: Negative for rash and wound.  Neurological: Negative for headaches.    Hematological: Does not bruise/bleed easily.  Psychiatric/Behavioral: Negative for confusion.     Physical Exam Updated Vital Signs BP (!) 138/92 (BP Location: Right Arm)   Pulse 71   Temp 98.3 F (36.8 C) (Oral)   Resp 18   LMP  (LMP Unknown)   SpO2 100%   Physical Exam  Constitutional: She is oriented to person, place, and time. She appears well-developed and well-nourished. No distress.  HENT:  Head: Normocephalic and atraumatic.  Mouth/Throat: Oropharynx is clear and moist.  Eyes: Pupils are equal, round, and reactive to light. Conjunctivae and EOM are normal.  Neck: Neck supple.  Cardiovascular: Normal  rate and regular rhythm.  Pulmonary/Chest: Effort normal and breath sounds normal. No respiratory distress.  Abdominal: Soft. Bowel sounds are normal. There is no tenderness.  Musculoskeletal: She exhibits tenderness.  Patient's left arm without any significant abnormalities other than the area of induration in the axillary area measuring about 5 x 6 cm.  No fluctuance.  Radial pulse distally on the arm is 2+.  It is not involving her left breast.  Neurological: She is alert and oriented to person, place, and time. No cranial nerve deficit or sensory deficit. She exhibits normal muscle tone. Coordination normal.  Skin: Skin is warm.  Nursing note and vitals reviewed.    ED Treatments / Results  Labs (all labs ordered are listed, but only abnormal results are displayed) Labs Reviewed  URINALYSIS, ROUTINE W REFLEX MICROSCOPIC - Abnormal; Notable for the following components:      Result Value   Color, Urine AMBER (*)    APPearance HAZY (*)    Ketones, ur 5 (*)    Protein, ur 30 (*)    Leukocytes, UA TRACE (*)    Bacteria, UA RARE (*)    All other components within normal limits  CBC - Abnormal; Notable for the following components:   RBC 3.62 (*)    Hemoglobin 10.9 (*)    HCT 32.2 (*)    All other components within normal limits  URINE CULTURE  BASIC METABOLIC  PANEL  PREGNANCY, URINE    EKG None  Radiology No results found.  Procedures Procedures (including critical care time)  Medications Ordered in ED Medications  clindamycin (CLEOCIN) capsule 450 mg (has no administration in time range)  ondansetron (ZOFRAN-ODT) disintegrating tablet 4 mg (4 mg Oral Given 12/28/17 2020)  HYDROmorphone (DILAUDID) injection 1 mg (1 mg Intramuscular Given 12/28/17 2020)  promethazine (PHENERGAN) tablet 25 mg (25 mg Oral Given 12/28/17 2155)     Initial Impression / Assessment and Plan / ED Course  I have reviewed the triage vital signs and the nursing notes.  Pertinent labs & imaging results that were available during my care of the patient were reviewed by me and considered in my medical decision making (see chart for details).     Patient seen on the 20th for a cellulitis fairly extensive in the left axillary area.  Patient was having fevers.  Had a markedly elevated white blood cell count of 17,000.  CT scan of the chest was done to rule out abscess.  It was negative for any abscess however was positive for induration and most consistent with cellulitis.  At that time it was reported to be 5 x 5 cm.  This approximate same size as today.  Today on exam no evidence of any fluctuance or any pointing of an abscess.  Patient given her evening dose of clindamycin here.  Given one-time pain shot with hydromorphone.  Patient also given Zofran and Phenergan.  Patient will be discharged home will continue with her antibiotic.  Patient wanted additional narcotic prescription as she was given narcotics to go home with on Friday.  Not able to renew that.  Patient will be treated with Naprosyn.  Patient given referral information to the wellness clinic as well as health [facility]  Do recommend that she is re-seen again in 2 to 4 days.  And return earlier for any new or worse symptoms.  Patient's white blood count here today is significantly down  from 17,000.  Today white blood cell count  is down to normal 11 at 10.4.  Patient's urinalysis contamination or possible UTI.  Urine sent for culture.  Patient will be contacted if the urine is consistent with urinary tract infection.  Final Clinical Impressions(s) / ED Diagnoses   Final diagnoses:  Cellulitis of left axilla    ED Discharge Orders         Ordered    naproxen (NAPROSYN) 500 MG tablet  2 times daily     12/28/17 2235           Vanetta Mulders, MD 12/28/17 1191    Vanetta Mulders, MD 12/28/17 2243

## 2017-12-28 NOTE — ED Triage Notes (Signed)
Pt recently treated for cellulitis. Pt states she continues to have fevers and now has burning with urination. Pt c/o body aches to L side of body, especially abdomen.

## 2017-12-30 LAB — URINE CULTURE
Culture: NO GROWTH
SPECIAL REQUESTS: NORMAL

## 2018-01-01 ENCOUNTER — Ambulatory Visit (HOSPITAL_COMMUNITY)
Admission: EM | Admit: 2018-01-01 | Discharge: 2018-01-01 | Disposition: A | Discharge status: Home / Self Care | Payer: Medicaid Other | Attending: Family Medicine | Admitting: Family Medicine

## 2018-01-01 ENCOUNTER — Encounter (HOSPITAL_COMMUNITY): Payer: Self-pay | Admitting: Family Medicine

## 2018-01-01 DIAGNOSIS — L02412 Cutaneous abscess of left axilla: Secondary | ICD-10-CM | POA: Diagnosis not present

## 2018-01-01 MED ORDER — ONDANSETRON 8 MG PO TBDP: 8.0000 mg | ORAL_TABLET | Freq: Three times a day (TID) | ORAL | 0 refills | Status: DC | PRN

## 2018-01-01 MED ORDER — OXYCODONE-ACETAMINOPHEN 5-325 MG PO TABS: 1.0000 | ORAL_TABLET | Freq: Three times a day (TID) | ORAL | 0 refills | Status: DC | PRN

## 2018-01-01 MED ORDER — CLINDAMYCIN HCL 150 MG PO CAPS: 150.0000 mg | ORAL_CAPSULE | Freq: Three times a day (TID) | ORAL | 0 refills | Status: DC

## 2018-01-01 MED ORDER — CLINDAMYCIN HCL 150 MG PO CAPS: 450.0000 mg | ORAL_CAPSULE | Freq: Three times a day (TID) | ORAL | 0 refills | Status: DC

## 2018-01-01 NOTE — ED Triage Notes (Signed)
Pt presents with complaints of abscess to her left underarm x 7 days. Reports increased pain. States she was at the er and seen and it has softened some.

## 2018-01-01 NOTE — ED Provider Notes (Addendum)
[facility]    CSN: [id number] Arrival date & time: 01/01/18  1516     History   Chief Complaint Chief Complaint  Patient presents with  . Abscess    HPI [name] is a 37 y.o. female.   This is a 37 year old woman who presents with over a week of left axillary pain, swelling, and induration.  She was seen twice in the last week by the emergency department and given clindamycin.  A CT scan was done as well.  Patient has been diagnosed with sickle cell trait, schizophrenia, MRSA, and history of substance abuse in the past.     Past Medical History:  Diagnosis Date  . Anemia   . Bipolar 1 disorder (HCC)   . Chlamydia   . Chronic abdominal pain   . Chronic nausea   . Deliberate self-cutting   . History of substance abuse   . HSV-2 infection 2015  . Hypertension   . Infection    MRSA in 1995, negative since  . Schizophrenia (HCC)   . Seizures (HCC)    Not recently  . Sickle cell trait (HCC)   . Trichomonas infection     Patient Active Problem List   Diagnosis Date Noted  . Abdominal pain 12/02/2017  . Back pain affecting pregnancy in third trimester 03/20/2017  . Labor, premature with delivery 03/20/2017  . Preterm labor in third trimester 03/03/2017  . Chronic hypertension in pregnancy 02/21/2017  . GERD (gastroesophageal reflux disease) 02/21/2017  . Umbilical hernia 01/13/2017  . Hereditary disease in family possibly affecting fetus, fetus 1   . Penicillin allergy 10/02/2016  . Opiate use 09/25/2016  . Supervision of high risk pregnancy, antepartum 09/19/2016  . History of herpes genitalis 09/19/2016  . History of trichomoniasis 09/19/2016  . Advanced maternal age in multigravida, second trimester 09/19/2016  . Grand multiparity 09/19/2016  . Short interval between pregnancies affecting pregnancy in first trimester, antepartum 09/19/2016  . Dichorionic diamniotic twin gestation 09/19/2016  . Chronic hypertension 07/03/2016  . Tobacco  abuse 12/19/2014  . Bipolar disorder (HCC) 12/19/2014  . History of substance abuse 12/19/2014  . Sickle cell trait (HCC) 12/19/2014    Past Surgical History:  Procedure Laterality Date  . CESAREAN SECTION N/A 03/21/2017   Procedure: CESAREAN SECTION;  Surgeon: Pillow Bing, MD;  Location: [facility]THING [facility]IT[facility];  Service: Obstetrics;  Laterality: N/A;  . DILATION AND CURETTAGE OF UTERUS    . HEMORROIDECTOMY  2010  . plastic surgery on face    . UMBILICAL HERNIA REPAIR N/A 12/02/2017   Procedure: LAPAROSCOPIC EXCISION OF MESH ERAS PATHWAY;  Surgeon: Kinsinger, De Blanch, MD;  Location: [facility];  Service: General;  Laterality: N/A;    OB History    Gravida  15   Para  9   Term  8   Preterm  1   AB  6   Living  10     SAB  6   TAB  0   Ectopic  0   Multiple  1   Live Births  10            Home Medications    Prior to Admission medications   Medication Sig Start Date End Date Taking? Authorizing Provider  clindamycin (CLEOCIN) 150 MG capsule Take 1 capsule (150 mg total) by mouth 3 (three) times daily. 01/01/18   Elvina Sidle, MD  naproxen (NAPROSYN) 500 MG tablet Take 1 tablet (500 mg total) by mouth 2 (two) times  daily. 12/28/17   Vanetta Mulders, MD  ondansetron (ZOFRAN-ODT) 8 MG disintegrating tablet Take 1 tablet (8 mg total) by mouth every 8 (eight) hours as needed for nausea. 01/01/18   Elvina Sidle, MD  oxyCODONE-acetaminophen (PERCOCET/ROXICET) 5-325 MG tablet Take 1 tablet by mouth every 8 (eight) hours as needed for severe pain. 01/01/18   Elvina Sidle, MD    Family History Family History  Problem Relation Age of Onset  . Cancer Mother   . Heart failure Mother   . Hypertension Mother   . Stroke Mother   . Diabetes Mother   . Hypertension Maternal Grandmother   . Anesthesia problems Neg Hx     Social History Social History   Tobacco Use  . Smoking status: Former Smoker    Packs/day: 0.50    Years: 1.50    Pack years: 0.75     Types: Cigarettes    Last attempt to quit: 04/09/2015    Years since quitting: 2.7  . Smokeless tobacco: Never Used  Substance Use Topics  . Alcohol use: No    Comment: hx drug use  . Drug use: No    Types: Marijuana, Cocaine    Comment: past use, 4 years ago     Allergies   Peanut-containing drug products; Amoxicillin; and Latex   Review of Systems Review of Systems  Constitutional: Negative.   HENT: Negative.   Respiratory: Negative.   Gastrointestinal: Positive for nausea. Negative for abdominal pain and vomiting.  Skin: Negative for color change, pallor and wound.       Swelling under left arm  Psychiatric/Behavioral: Negative.   All other systems reviewed and are negative.    Physical Exam Triage Vital Signs ED Triage Vitals  Enc Vitals Group     BP      Pulse      Resp      Temp      Temp src      SpO2      Weight      Height      Head Circumference      Peak Flow      Pain Score      Pain Loc      Pain Edu?      Excl. in GC?    No data found.  Updated Vital Signs BP (!) 153/94 (BP Location: Right Arm)   Pulse 91   Temp 98.7 F (37.1 C) (Oral)   Resp 16   LMP 12/05/2017   SpO2 98%    Physical Exam  Constitutional: She is oriented to person, place, and time. She appears well-developed and well-nourished.  HENT:  Right Ear: External ear normal.  Left Ear: External ear normal.  Mouth/Throat: Oropharynx is clear and moist.  Eyes: Conjunctivae are normal.  Neck: Normal range of motion. Neck supple.  Pulmonary/Chest: Effort normal.  Musculoskeletal: Normal range of motion.  Neurological: She is alert and oriented to person, place, and time.  Skin:  2 to 3 cm mass in the left axilla.  Psychiatric: She has a normal mood and affect.  Nursing note and vitals reviewed.    UC Treatments / Results  Labs (all labs ordered are listed, but only abnormal results are displayed) Labs Reviewed - No data to display  EKG None  Radiology CLINICAL  DATA:  LEFT axillary pain, fever and nausea. History of substance abuse.  EXAM: CT CHEST WITH CONTRAST  TECHNIQUE: Multidetector CT imaging of the chest was performed during intravenous contrast  administration.  CONTRAST:  75mL OMNIPAQUE IOHEXOL 300 MG/ML  SOLN  COMPARISON:  Chest radiograph Aug 16, 2014  FINDINGS: CARDIOVASCULAR: Heart and pericardium are unremarkable. Thoracic aorta is normal course and caliber, unremarkable.  MEDIASTINUM/NODES: No mediastinal mass. No lymphadenopathy by CT size criteria. Normal appearance of thoracic esophagus though not tailored for evaluation.  LUNGS/PLEURA: Tracheobronchial tree is patent, no pneumothorax. No pleural effusions, focal consolidations, pulmonary nodules or masses.  UPPER ABDOMEN: Nonacute. 22 mm simple cyst RIGHT lobe of the liver, 11 mm cyst pole cyst segment 4.  MUSCULOSKELETAL: Focal subcentimeter nodular thickening RIGHT hemidiaphragm. LEFT chest wall edema extending to the retro pectoral fat, associated skin thickening without focal fluid collection, subcutaneous gas or radiopaque foreign bodies. Preservation of the intramuscular fat planes.  IMPRESSION: 1. LEFT axillary and chest wall cellulitis without abscess. 2. No acute cardiopulmonary process.   Electronically Signed   By: Awilda Metro M.D.   On: 12/26/2017 20:35  Procedures Incision and Drainage Date/Time: 01/01/2018 3:56 PM Performed by: Elvina Sidle, MD Authorized by: Elvina Sidle, MD   Consent:    Consent obtained:  Verbal   Consent given by:  Patient   Risks discussed:  Infection   Alternatives discussed:  No treatment Location:    Type:  Abscess   Location:  Upper extremity   Upper extremity location:  Shoulder   Shoulder location:  L shoulder Pre-procedure details:    Skin preparation:  Betadine Anesthesia (see MAR for exact dosages):    Anesthesia method:  Local infiltration   Local anesthetic:  Lidocaine  2% WITH epi Procedure type:    Complexity:  Simple Procedure details:    Needle aspiration: no     Incision types:  Stab incision   Incision depth:  Dermal   Scalpel blade:  11   Wound management:  Probed and deloculated   Drainage:  Purulent   Drainage amount:  Copious   Packing materials:  None Post-procedure details:    Patient tolerance of procedure:  Tolerated well, no immediate complications   (including critical care time)  Medications Ordered in UC Medications - No data to display  Initial Impression / Assessment and Plan / UC Course  I have reviewed the triage vital signs and the nursing notes.  Pertinent labs & imaging results that were available during my care of the patient were reviewed by me and considered in my medical decision making (see chart for details).     Final Clinical Impressions(s) / UC Diagnoses   Final diagnoses:  Abscess of left axilla   Discharge Instructions   None    ED Prescriptions    Medication Sig Dispense Auth. Provider   clindamycin (CLEOCIN) 150 MG capsule  (Status: Discontinued) Take 3 capsules (450 mg total) by mouth 3 (three) times daily for 7 days. 63 capsule Elvina Sidle, MD   oxyCODONE-acetaminophen (PERCOCET/ROXICET) 5-325 MG tablet Take 1 tablet by mouth every 8 (eight) hours as needed for severe pain. 6 tablet Elvina Sidle, MD   clindamycin (CLEOCIN) 150 MG capsule Take 1 capsule (150 mg total) by mouth 3 (three) times daily. 15 capsule Elvina Sidle, MD   ondansetron (ZOFRAN-ODT) 8 MG disintegrating tablet Take 1 tablet (8 mg total) by mouth every 8 (eight) hours as needed for nausea. 12 tablet Elvina Sidle, MD     Controlled Substance Prescriptions [place] Controlled Substance Registry consulted? recent pain medication from ED for this problem   Elvina Sidle, MD 01/01/18 1558    Elvina Sidle, MD 01/01/18 [phone]  Elvina Sidle, MD 01/01/18 1610

## 2018-01-01 NOTE — ED Notes (Signed)
Patient returned to department requesting medicine for nausea.

## 2018-01-21 ENCOUNTER — Encounter (HOSPITAL_COMMUNITY): Payer: Self-pay | Admitting: Emergency Medicine

## 2018-01-21 ENCOUNTER — Ambulatory Visit (HOSPITAL_COMMUNITY)
Admission: EM | Admit: 2018-01-21 | Discharge: 2018-01-21 | Disposition: A | Discharge status: Home / Self Care | Payer: Medicaid Other | Attending: Family Medicine | Admitting: Family Medicine

## 2018-01-21 DIAGNOSIS — J069 Acute upper respiratory infection, unspecified: Secondary | ICD-10-CM | POA: Diagnosis present

## 2018-01-21 DIAGNOSIS — Z8249 Family history of ischemic heart disease and other diseases of the circulatory system: Secondary | ICD-10-CM | POA: Insufficient documentation

## 2018-01-21 DIAGNOSIS — D573 Sickle-cell trait: Secondary | ICD-10-CM | POA: Diagnosis not present

## 2018-01-21 DIAGNOSIS — Z87891 Personal history of nicotine dependence: Secondary | ICD-10-CM | POA: Diagnosis not present

## 2018-01-21 DIAGNOSIS — F209 Schizophrenia, unspecified: Secondary | ICD-10-CM | POA: Insufficient documentation

## 2018-01-21 DIAGNOSIS — I10 Essential (primary) hypertension: Secondary | ICD-10-CM | POA: Insufficient documentation

## 2018-01-21 DIAGNOSIS — F319 Bipolar disorder, unspecified: Secondary | ICD-10-CM | POA: Diagnosis not present

## 2018-01-21 DIAGNOSIS — N898 Other specified noninflammatory disorders of vagina: Secondary | ICD-10-CM | POA: Insufficient documentation

## 2018-01-21 DIAGNOSIS — M79602 Pain in left arm: Secondary | ICD-10-CM | POA: Diagnosis not present

## 2018-01-21 DIAGNOSIS — Z880 Allergy status to penicillin: Secondary | ICD-10-CM | POA: Insufficient documentation

## 2018-01-21 LAB — POCT URINALYSIS DIP (DEVICE)
Glucose, UA: 100 mg/dL — AB
Hgb urine dipstick: NEGATIVE
KETONES UR: NEGATIVE mg/dL
Leukocytes, UA: NEGATIVE
Nitrite: NEGATIVE
PH: 6 (ref 5.0–8.0)
Protein, ur: 30 mg/dL — AB
Urobilinogen, UA: 1 mg/dL (ref 0.0–1.0)

## 2018-01-21 MED ORDER — DICLOFENAC SODIUM 75 MG PO TBEC: 75.0000 mg | DELAYED_RELEASE_TABLET | Freq: Two times a day (BID) | ORAL | 0 refills | Status: DC

## 2018-01-21 MED ORDER — METRONIDAZOLE 500 MG PO TABS: 500.0000 mg | ORAL_TABLET | Freq: Two times a day (BID) | ORAL | 0 refills | Status: DC

## 2018-01-21 MED ORDER — FLUCONAZOLE 150 MG PO TABS: ORAL_TABLET | ORAL | 0 refills | Status: DC

## 2018-01-21 NOTE — ED Triage Notes (Signed)
Pt here for multiple complaints, states she has congestion and facial pain, pt also c/o vaginal itching and discharge. Pt also states she has skin pain under her L arm.

## 2018-01-21 NOTE — Discharge Instructions (Addendum)
We have sent testing for sexually transmitted infections. We will notify you of any positive results once they are received. If required, we will prescribe any medications you might need.  Please refrain from all sexual activity for at least the next seven days.  

## 2018-01-22 LAB — CERVICOVAGINAL ANCILLARY ONLY
Bacterial vaginitis: POSITIVE — AB
CANDIDA VAGINITIS: NEGATIVE
Chlamydia: NEGATIVE
Neisseria Gonorrhea: NEGATIVE
Trichomonas: NEGATIVE

## 2018-01-22 NOTE — ED Provider Notes (Signed)
[facility]   [id number] 01/21/18 Arrival Time: 1141  ASSESSMENT & PLAN:  1. Vaginal discharge   2. Viral upper respiratory tract infection   3. Left arm pain     Meds ordered this encounter  Medications  . fluconazole (DIFLUCAN) 150 MG tablet    Sig: Take one tablet by mouth as a single dose.    Dispense:  1 tablet    Refill:  0  . metroNIDAZOLE (FLAGYL) 500 MG tablet    Sig: Take 1 tablet (500 mg total) by mouth 2 (two) times daily.    Dispense:  14 tablet    Refill:  0  . diclofenac (VOLTAREN) 75 MG EC tablet    Sig: Take 1 tablet (75 mg total) by mouth 2 (two) times daily.    Dispense:  14 tablet    Refill:  0   Labs Reviewed  POCT URINALYSIS DIP (DEVICE) - Abnormal; Notable for the following components:      Result Value   Glucose, UA 100 (*)    Bilirubin Urine SMALL (*)    Protein, ur 30 (*)    All other components within normal limits  CERVICOVAGINAL ANCILLARY ONLY   Await cytology. Will notify of any positive results. Discussed typical duration of viral URI. OTC symptom care as needed. Question exact etiology of L arm discomfort. Prefers trial of NSAIDs. No s/s of infection.   May f/u with PCP or here as needed.  Reviewed expectations re: course of current medical issues. Questions answered. Outlined signs and symptoms indicating need for more acute intervention. Patient verbalized understanding. After Visit Summary given.   SUBJECTIVE: History from: patient. [name] is a 38 y.o. female who reports:  1) Vaginal discharge. No concern for STI. Similar symptoms in the past dx as BV/yeast. For several days. Gradual onset. No abd/pelvic pain. No urinary symptoms. No skin lesions/rashes. No OTC tx.  2) Cold symptoms. Abrupt onset. 1-2 days. Nasal congestion, mild. Occasional dry cough. Some body aches. Afebrile. No OTC tx. No specific aggravating or alleviating factors reported.  3) Left arm "soreness". Around axilla. No skin changes or  lumps noticed. No injury. No swelling reported. No OTC tx. No specific aggravating or alleviating factors reported. No itching. H/O L axilla abscess/cellulits seen on 9/29. Resolved. She questions relation. No extremity sensation changes or weakness.  ROS: As per HPI.   OBJECTIVE:  Vitals:   01/21/18 1256  BP: (!) 143/93  Pulse: 80  Resp: 18  Temp: 98.6 F (37 C)  SpO2: 99%    General appearance: alert; no distress Eyes: PERRLA; EOMI; conjunctiva normal; mild nasal congestion HENT: normocephalic; atraumatic; oral mucosa normal Neck: supple  Lungs: clear to auscultation bilaterally; unlabored Heart: regular rate and rhythm without murmer Abdomen: soft, non-tender GU: deferred Back: no CVA tenderness Extremities: no cyanosis or edema; symmetrical with no gross deformities; no axilla masses or tenderness; LUE with FROM and normal strength Skin: warm and dry Neurologic: normal gait; normal symmetric reflexes Psychological: alert and cooperative; normal mood and affect  Labs: Results for orders placed or performed during the hospital encounter of 01/21/18  POCT urinalysis dip (device)  Result Value Ref Range   Glucose, UA 100 (A) NEGATIVE mg/dL   Bilirubin Urine SMALL (A) NEGATIVE   Ketones, ur NEGATIVE NEGATIVE mg/dL   Specific Gravity, Urine >=1.030 1.005 - 1.030   Hgb urine dipstick NEGATIVE NEGATIVE   pH 6.0 5.0 - 8.0   Protein, ur 30 (A) NEGATIVE mg/dL  Urobilinogen, UA 1.0 0.0 - 1.0 mg/dL   Nitrite NEGATIVE NEGATIVE   Leukocytes, UA NEGATIVE NEGATIVE   Labs Reviewed  POCT URINALYSIS DIP (DEVICE) - Abnormal; Notable for the following components:      Result Value   Glucose, UA 100 (*)    Bilirubin Urine SMALL (*)    Protein, ur 30 (*)    All other components within normal limits  CERVICOVAGINAL ANCILLARY ONLY      Allergies  Allergen Reactions  . Peanut-Containing Drug Products Anaphylaxis, Itching and Rash  . Amoxicillin Hives, Swelling and Other (See  Comments)    Has patient had a PCN reaction causing immediate rash, facial/tongue/throat swelling, SOB or lightheadedness with hypotension: No Has patient had a PCN reaction causing severe rash involving mucus membranes or skin necrosis: No Has patient had a PCN reaction that required hospitalization No Has patient had a PCN reaction occurring within the last 10 years: Yes If all of the above answers are "NO", then may proceed with Cephalosporin use.  . Latex Hives and Itching    Past Medical History:  Diagnosis Date  . Anemia   . Bipolar 1 disorder (HCC)   . Chlamydia   . Chronic abdominal pain   . Chronic nausea   . Deliberate self-cutting   . History of substance abuse (HCC)   . HSV-2 infection 2015  . Hypertension   . Infection    MRSA in 1995, negative since  . Schizophrenia (HCC)   . Seizures (HCC)    Not recently  . Sickle cell trait (HCC)   . Trichomonas infection    Social History   Socioeconomic History  . Marital status: Legally Separated    Spouse name: Not on file  . Number of children: Not on file  . Years of education: Not on file  . Highest education level: Not on file  Occupational History  . Not on file  Social Needs  . Financial resource strain: Not on file  . Food insecurity:    Worry: Not on file    Inability: Not on file  . Transportation needs:    Medical: Not on file    Non-medical: Not on file  Tobacco Use  . Smoking status: Former Smoker    Packs/day: 0.50    Years: 1.50    Pack years: 0.75    Types: Cigarettes    Last attempt to quit: 04/09/2015    Years since quitting: 2.7  . Smokeless tobacco: Never Used  Substance and Sexual Activity  . Alcohol use: No    Comment: hx drug use  . Drug use: No    Types: Marijuana, Cocaine    Comment: past use, 4 years ago  . Sexual activity: Not Currently    Birth control/protection: None    Comment: desires Depo Provera  Lifestyle  . Physical activity:    Days per week: Not on file     Minutes per session: Not on file  . Stress: Not on file  Relationships  . Social connections:    Talks on phone: Not on file    Gets together: Not on file    Attends religious service: Not on file    Active member of club or organization: Not on file    Attends meetings of clubs or organizations: Not on file    Relationship status: Not on file  . Intimate partner violence:    Fear of current or ex partner: Not on file    Emotionally abused:  Not on file    Physically abused: Not on file    Forced sexual activity: Not on file  Other Topics Concern  . Not on file  Social History Narrative  . Not on file   Family History  Problem Relation Age of Onset  . Cancer Mother   . Heart failure Mother   . Hypertension Mother   . Stroke Mother   . Diabetes Mother   . Hypertension Maternal Grandmother   . Anesthesia problems Neg Hx    Past Surgical History:  Procedure Laterality Date  . CESAREAN SECTION N/A 03/21/2017   Procedure: CESAREAN SECTION;  Surgeon: Grill Bing, MD;  Location: [facility] SU[facility];  Service: Obstetrics;  Laterality: N/A;  . DILATION AND CURETTAGE OF UTERUS    . HEMORROIDECTOMY  2010  . plastic surgery on face    . UMBILICAL HERNIA REPAIR N/A 12/02/2017   Procedure: LAPAROSCOPIC EXCISION OF MESH ERAS PATHWAY;  Surgeon: Kinsinger, De Blanch, MD;  Location: [facility];  Service: General;  Laterality: Vertis Kelch, MD 01/22/18 [phone]

## 2018-01-28 ENCOUNTER — Telehealth (HOSPITAL_COMMUNITY): Payer: Self-pay | Admitting: Emergency Medicine

## 2018-01-28 NOTE — Telephone Encounter (Signed)
Pt called today and LM at 1345.  Pt had questions about her labs.  Call was returned at 2004 with a generic message left on a nondescript VM to return our call.

## 2018-02-11 ENCOUNTER — Ambulatory Visit: Payer: Medicaid Other | Attending: General Surgery | Admitting: Physical Therapy

## 2018-02-25 ENCOUNTER — Encounter (HOSPITAL_COMMUNITY): Payer: Self-pay | Admitting: Emergency Medicine

## 2018-02-25 ENCOUNTER — Ambulatory Visit (HOSPITAL_COMMUNITY)
Admission: EM | Admit: 2018-02-25 | Discharge: 2018-02-25 | Disposition: A | Discharge status: Home / Self Care | Payer: Medicaid Other | Attending: Family Medicine | Admitting: Family Medicine

## 2018-02-25 DIAGNOSIS — I10 Essential (primary) hypertension: Secondary | ICD-10-CM | POA: Insufficient documentation

## 2018-02-25 DIAGNOSIS — N898 Other specified noninflammatory disorders of vagina: Secondary | ICD-10-CM | POA: Insufficient documentation

## 2018-02-25 DIAGNOSIS — Z87891 Personal history of nicotine dependence: Secondary | ICD-10-CM | POA: Diagnosis not present

## 2018-02-25 DIAGNOSIS — Z880 Allergy status to penicillin: Secondary | ICD-10-CM | POA: Diagnosis not present

## 2018-02-25 DIAGNOSIS — R300 Dysuria: Secondary | ICD-10-CM | POA: Diagnosis not present

## 2018-02-25 DIAGNOSIS — D573 Sickle-cell trait: Secondary | ICD-10-CM | POA: Diagnosis not present

## 2018-02-25 DIAGNOSIS — R102 Pelvic and perineal pain: Secondary | ICD-10-CM | POA: Insufficient documentation

## 2018-02-25 DIAGNOSIS — J029 Acute pharyngitis, unspecified: Secondary | ICD-10-CM | POA: Insufficient documentation

## 2018-02-25 DIAGNOSIS — R5383 Other fatigue: Secondary | ICD-10-CM | POA: Diagnosis not present

## 2018-02-25 DIAGNOSIS — F319 Bipolar disorder, unspecified: Secondary | ICD-10-CM | POA: Diagnosis not present

## 2018-02-25 DIAGNOSIS — F209 Schizophrenia, unspecified: Secondary | ICD-10-CM | POA: Insufficient documentation

## 2018-02-25 DIAGNOSIS — N738 Other specified female pelvic inflammatory diseases: Secondary | ICD-10-CM

## 2018-02-25 DIAGNOSIS — Z8249 Family history of ischemic heart disease and other diseases of the circulatory system: Secondary | ICD-10-CM | POA: Diagnosis not present

## 2018-02-25 DIAGNOSIS — N739 Female pelvic inflammatory disease, unspecified: Secondary | ICD-10-CM | POA: Diagnosis not present

## 2018-02-25 LAB — POCT URINALYSIS DIP (DEVICE)
Bilirubin Urine: NEGATIVE
GLUCOSE, UA: NEGATIVE mg/dL
Hgb urine dipstick: NEGATIVE
KETONES UR: 15 mg/dL — AB
Leukocytes, UA: NEGATIVE
Nitrite: NEGATIVE
PROTEIN: NEGATIVE mg/dL
Specific Gravity, Urine: 1.015 (ref 1.005–1.030)
Urobilinogen, UA: 4 mg/dL — ABNORMAL HIGH (ref 0.0–1.0)
pH: 8.5 — ABNORMAL HIGH (ref 5.0–8.0)

## 2018-02-25 LAB — POCT PREGNANCY, URINE: PREG TEST UR: NEGATIVE

## 2018-02-25 LAB — POCT RAPID STREP A: Streptococcus, Group A Screen (Direct): NEGATIVE

## 2018-02-25 MED ORDER — DOXYCYCLINE HYCLATE 100 MG PO CAPS: 100.0000 mg | ORAL_CAPSULE | Freq: Two times a day (BID) | ORAL | 0 refills | Status: AC

## 2018-02-25 MED ORDER — AZITHROMYCIN 250 MG PO TABS
ORAL_TABLET | ORAL | Status: AC
  Filled 2018-02-25: qty 4

## 2018-02-25 MED ORDER — MICONAZOLE NITRATE 2 % VA CREA: 1.0000 | TOPICAL_CREAM | Freq: Every day | VAGINAL | 0 refills | Status: DC

## 2018-02-25 MED ORDER — AZITHROMYCIN 250 MG PO TABS
1000.0000 mg | ORAL_TABLET | Freq: Once | ORAL | Status: AC
  Administered 2018-02-25: 1000 mg via ORAL

## 2018-02-25 MED ORDER — CEFTRIAXONE SODIUM 250 MG IJ SOLR
250.0000 mg | Freq: Once | INTRAMUSCULAR | Status: AC
  Administered 2018-02-25: 250 mg via INTRAMUSCULAR

## 2018-02-25 MED ORDER — METRONIDAZOLE 500 MG PO TABS: 500.0000 mg | ORAL_TABLET | Freq: Two times a day (BID) | ORAL | 0 refills | Status: AC

## 2018-02-25 MED ORDER — CEFTRIAXONE SODIUM 250 MG IJ SOLR
INTRAMUSCULAR | Status: AC
  Filled 2018-02-25: qty 250

## 2018-02-25 NOTE — ED Provider Notes (Signed)
[facility]ARE [facility]ENTER   [id number] 02/25/18 Arrival Time: 1321   CC:Sore throat and pelvic pain  SUBJECTIVE:  [name] is a 37 y.o. female who presents with sore throat x 2 days.  Denies positive sick exposure.  Has not tried OTC medications.  Worse with swallowing, but tolerating liquids and own secretions without difficulty.  Reports previous hx of strep 2 years ago.  Complains of associated fatigue.  Denies ear pain, fever, chills, rhinorrhea, nasal congestion, cough, SOB, or wheezing.    Also complains of pelvic pain, vaginal discharge, vaginal odor, and vaginal itching/irritation that began 2 days ago.  Symptoms began after having sexual intercourse with her husband.  Patient is sexually active with 1 female partner. Describes pain as worsening, constant, and achy/throbbing in character. Describes discharge as thick and white.  She has NOT tried OTC medications.  She reports worsening symptoms with sexual intercourse.  She reports similar symptoms in the past and was diagnosed with STIs. Complains of dysuria and dyspareunia.  She denies fever, chills, nausea, vomiting, abdominal, vaginal bleeding, vaginal rashes or lesions.   No LMP recorded (lmp unknown).  Current birth control method: None  ROS: As per HPI.  Past Medical History:  Diagnosis Date  . Anemia   . Bipolar 1 disorder (HCC)   . Chlamydia   . Chronic abdominal pain   . Chronic nausea   . Deliberate self-cutting   . History of substance abuse (HCC)   . HSV-2 infection 2015  . Hypertension   . Infection    MRSA in 1995, negative since  . Schizophrenia (HCC)   . Seizures (HCC)    Not recently  . Sickle cell trait (HCC)   . Trichomonas infection    Past Surgical History:  Procedure Laterality Date  . CESAREAN SECTION N/A 03/21/2017   Procedure: CESAREAN SECTION;  Surgeon: [place] Bing, MD;  Location: [facility] BIRTHING SUITES;  Service: Obstetrics;  Laterality: N/A;  . DILATION AND CURETTAGE OF UTERUS    .  HEMORROIDECTOMY  2010  . plastic surgery on face    . UMBILICAL HERNIA REPAIR N/A 12/02/2017   Procedure: LAPAROSCOPIC EXCISION OF MESH ERAS PATHWAY;  Surgeon: Kinsinger, De Blanch, MD;  Location: [facility];  Service: General;  Laterality: N/A;   Allergies  Allergen Reactions  . Peanut-Containing Drug Products Anaphylaxis, Itching and Rash  . Amoxicillin Hives, Swelling and Other (See Comments)    Has patient had a PCN reaction causing immediate rash, facial/tongue/throat swelling, SOB or lightheadedness with hypotension: No Has patient had a PCN reaction causing severe rash involving mucus membranes or skin necrosis: No Has patient had a PCN reaction that required hospitalization No Has patient had a PCN reaction occurring within the last 10 years: Yes If all of the above answers are "NO", then may proceed with Cephalosporin use.  . Latex Hives and Itching   No current facility-administered medications on file prior to encounter.    No current outpatient medications on file prior to encounter.    Social History   Socioeconomic History  . Marital status: Legally Separated    Spouse name: Not on file  . Number of children: Not on file  . Years of education: Not on file  . Highest education level: Not on file  Occupational History  . Not on file  Social Needs  . Financial resource strain: Not on file  . Food insecurity:    Worry: Not on file    Inability: Not on file  .  Transportation needs:    Medical: Not on file    Non-medical: Not on file  Tobacco Use  . Smoking status: Former Smoker    Packs/day: 0.50    Years: 1.50    Pack years: 0.75    Types: Cigarettes    Last attempt to quit: 04/09/2015    Years since quitting: 2.8  . Smokeless tobacco: Never Used  Substance and Sexual Activity  . Alcohol use: No    Comment: hx drug use  . Drug use: No    Types: Marijuana, Cocaine    Comment: past use, 4 years ago  . Sexual activity: Not Currently    Birth control/protection:  None    Comment: desires Depo Provera  Lifestyle  . Physical activity:    Days per week: Not on file    Minutes per session: Not on file  . Stress: Not on file  Relationships  . Social connections:    Talks on phone: Not on file    Gets together: Not on file    Attends religious service: Not on file    Active member of club or organization: Not on file    Attends meetings of clubs or organizations: Not on file    Relationship status: Not on file  . Intimate partner violence:    Fear of current or ex partner: Not on file    Emotionally abused: Not on file    Physically abused: Not on file    Forced sexual activity: Not on file  Other Topics Concern  . Not on file  Social History Narrative  . Not on file   Family History  Problem Relation Age of Onset  . Cancer Mother   . Heart failure Mother   . Hypertension Mother   . Stroke Mother   . Diabetes Mother   . Hypertension Maternal Grandmother   . Anesthesia problems Neg Hx     OBJECTIVE:  Vitals:   02/25/18 1428  BP: 118/78  Pulse: 62  Resp: 16  Temp: 98.3 F (36.8 C)  TempSrc: Oral  SpO2: 100%     General appearance: Alert, NAD, appears stated age Head: NCAT Throat: Ears: EACs clear, TMs pearly gray; Eyes: PERRL.  EOM grossly intact.  Nose: no rhinorrhea; tonsils nonerythematous, left tonsil with white exudate, uvula midline Mouth: lips, mucosa, and tongue normal; edentulous  Lungs: CTA bilaterally without adventitious breath sounds Heart: regular rate and rhythm.  Radial pulses 2+ symmetrical bilaterally Back: no CVA tenderness Abdomen: soft, mild diffuse tenderness about the abdomen; bowel sounds normal; no guarding  GU: External examination without vulvar lesions or erythema Bimanual exam: Positive for cervical motion and adenexal tenderness; Speculum exam: deferred, vaginal self-swab obtained Skin: warm and dry Psychological:  Alert and cooperative. Normal mood and affect.  LABS:  Results for orders  placed or performed during the hospital encounter of 02/25/18  POCT urinalysis dip (device)  Result Value Ref Range   Glucose, UA NEGATIVE NEGATIVE mg/dL   Bilirubin Urine NEGATIVE NEGATIVE   Ketones, ur 15 (A) NEGATIVE mg/dL   Specific Gravity, Urine 1.015 1.005 - 1.030   Hgb urine dipstick NEGATIVE NEGATIVE   pH 8.5 (H) 5.0 - 8.0   Protein, ur NEGATIVE NEGATIVE mg/dL   Urobilinogen, UA 4.0 (H) 0.0 - 1.0 mg/dL   Nitrite NEGATIVE NEGATIVE   Leukocytes, UA NEGATIVE NEGATIVE  POCT rapid strep A [facility])  Result Value Ref Range   Streptococcus, Group A Screen (Direct) NEGATIVE NEGATIVE  Pregnancy,  urine POC  Result Value Ref Range   Preg Test, Ur NEGATIVE NEGATIVE   Labs Reviewed  POCT URINALYSIS DIP (DEVICE) - Abnormal; Notable for the following components:      Result Value   Ketones, ur 15 (*)    pH 8.5 (*)    Urobilinogen, UA 4.0 (*)    All other components within normal limits  CULTURE, GROUP A STREP [facility])  POCT RAPID STREP A  POCT PREGNANCY, URINE  CYTOLOGY, (ORAL, ANAL, URETHRAL) ANCILLARY ONLY  CERVICOVAGINAL ANCILLARY ONLY   ASSESSMENT & PLAN:  1. Sore throat   2. Female pelvic inflammatory disease   3. Vaginal itching    Meds ordered this encounter  Medications  . metroNIDAZOLE (FLAGYL) 500 MG tablet    Sig: Take 1 tablet (500 mg total) by mouth 2 (two) times daily for 14 days.    Dispense:  28 tablet    Refill:  0    Order Specific Question:   Supervising Provider    Answer:   Isa RankinMURRAY, LAURA WILSON [id number][[id number]]  . doxycycline (VIBRAMYCIN) 100 MG capsule    Sig: Take 1 capsule (100 mg total) by mouth 2 (two) times daily for 14 days.    Dispense:  28 capsule    Refill:  0    Order Specific Question:   Supervising Provider    Answer:   Isa RankinMURRAY, LAURA WILSON [[id number]]  . miconazole (MONISTAT 7) 2 % vaginal cream    Sig: Place 1 Applicatorful vaginally at bedtime.    Dispense:  45 g    Refill:  0    Order Specific Question:   Supervising Provider     Answer:   Isa RankinMURRAY, LAURA WILSON [[id number]]  . azithromycin (ZITHROMAX) tablet 1,000 mg  . cefTRIAXone (ROCEPHIN) injection 250 mg    Pending: Labs Reviewed  POCT URINALYSIS DIP (DEVICE) - Abnormal; Notable for the following components:      Result Value   Ketones, ur 15 (*)    pH 8.5 (*)    Urobilinogen, UA 4.0 (*)    All other components within normal limits  CULTURE, GROUP A STREP [facility])  POCT RAPID STREP A  POCT PREGNANCY, URINE  CYTOLOGY, (ORAL, ANAL, URETHRAL) ANCILLARY ONLY  CERVICOVAGINAL ANCILLARY ONLY   Urine did not show signs of infection Urine pregnancy was negative Rapid strep was negative Oral swab obtained Vaginal self-swab obtained We will follow up with you regarding the results of your test Given rocephin 250mg  injection and azithromycin 1g in office Prescribed metronidazole 500 mg twice daily for 14 days (do not take while consuming alcohol and/or if breastfeeding) Prescribed doxycycline 100 mg twice daily for 14 days Prescribed monistat vaginal cream.  Use as directed We will follow up with you regarding the results of your test If tests are positive, please abstain from sexual activity for at least 7 days and notify partners Follow up with PCP or with [facility] if symptoms persists Return here or go to ER if you have any new or worsening symptoms fever, chills, nausea, vomiting, abdominal or pelvic pain, painful intercourse, vaginal discharge, vaginal bleeding, persistent symptoms despite treatment, etc...  Reviewed expectations re: course of current medical issues. Questions answered. Outlined signs and symptoms indicating need for more acute intervention. Patient verbalized understanding. After Visit Summary given.       Rennis HardingWurst, Jadriel Saxer, PA-C 02/25/18 1645

## 2018-02-25 NOTE — ED Triage Notes (Signed)
Pt here for multiple complaints, sore throat, stomach pain, nausea, vaginal discharge.

## 2018-02-25 NOTE — Discharge Instructions (Signed)
Urine did not show signs of infection Urine pregnancy was negative Rapid strep was negative Oral swab obtained Vaginal self-swab obtained We will follow up with you regarding the results of your test Given rocephin 250mg  injection and azithromycin 1g in office Prescribed metronidazole 500 mg twice daily for 14 days (do not take while consuming alcohol and/or if breastfeeding) Prescribed doxycycline 100 mg twice daily for 14 days Prescribed monistat vaginal cream.  Use as directed We will follow up with you regarding the results of your test If tests are positive, please abstain from sexual activity for at least 7 days and notify partners [facility] if symptoms persists Return here or go to ER if you have any new or worsening symptoms fever, chills, nausea, vomiting, abdominal or pelvic pain, painful intercourse, vaginal discharge, vaginal bleeding, persistent symptoms despite treatment, etc...Marland Kitchen

## 2018-02-26 LAB — CERVICOVAGINAL ANCILLARY ONLY
Bacterial vaginitis: POSITIVE — AB
CHLAMYDIA, DNA PROBE: NEGATIVE
Candida vaginitis: POSITIVE — AB
NEISSERIA GONORRHEA: NEGATIVE
Trichomonas: NEGATIVE

## 2018-02-26 LAB — CYTOLOGY, (ORAL, ANAL, URETHRAL) ANCILLARY ONLY
CHLAMYDIA, DNA PROBE: NEGATIVE
Candida vaginitis: NEGATIVE
NEISSERIA GONORRHEA: NEGATIVE
TRICH (WINDOWPATH): NEGATIVE

## 2018-02-28 LAB — CULTURE, GROUP A STREP (THRC)

## 2018-03-01 ENCOUNTER — Telehealth (HOSPITAL_COMMUNITY): Payer: Self-pay | Admitting: Emergency Medicine

## 2018-03-01 MED ORDER — FLUCONAZOLE 150 MG PO TABS: ORAL_TABLET | ORAL | 0 refills | Status: DC

## 2018-07-14 ENCOUNTER — Encounter: Payer: Self-pay | Admitting: *Deleted

## 2018-09-17 ENCOUNTER — Ambulatory Visit (HOSPITAL_COMMUNITY)
Admission: EM | Admit: 2018-09-17 | Discharge: 2018-09-17 | Disposition: A | Discharge status: Home / Self Care | Payer: Medicaid Other | Attending: Internal Medicine | Admitting: Internal Medicine

## 2018-09-17 ENCOUNTER — Encounter (HOSPITAL_COMMUNITY): Payer: Self-pay

## 2018-09-17 DIAGNOSIS — A6004 Herpesviral vulvovaginitis: Secondary | ICD-10-CM

## 2018-09-17 DIAGNOSIS — B9689 Other specified bacterial agents as the cause of diseases classified elsewhere: Secondary | ICD-10-CM | POA: Diagnosis not present

## 2018-09-17 DIAGNOSIS — N760 Acute vaginitis: Secondary | ICD-10-CM | POA: Diagnosis not present

## 2018-09-17 LAB — POCT URINALYSIS DIP (DEVICE)
Bilirubin Urine: NEGATIVE
Glucose, UA: 100 mg/dL — AB
Hgb urine dipstick: NEGATIVE
Ketones, ur: NEGATIVE mg/dL
Leukocytes,Ua: NEGATIVE
Nitrite: NEGATIVE
Protein, ur: NEGATIVE mg/dL
Specific Gravity, Urine: >=1.03 (ref 1.005–1.030)
Urobilinogen, UA: 1 mg/dL (ref 0.0–1.0)
pH: 6 (ref 5.0–8.0)

## 2018-09-17 MED ORDER — METRONIDAZOLE 500 MG PO TABS: 500.0000 mg | ORAL_TABLET | Freq: Two times a day (BID) | ORAL | 0 refills | Status: DC

## 2018-09-17 MED ORDER — ACYCLOVIR 400 MG PO TABS: 800.0000 mg | ORAL_TABLET | Freq: Two times a day (BID) | ORAL | 0 refills | Status: AC

## 2018-09-17 NOTE — ED Provider Notes (Signed)
[facility]    CSN: [id number] Arrival date & time: 09/17/18  1943      History   Chief Complaint Chief Complaint  Patient presents with  . pelvis pain    HPI [name] is a 38 y.o. female with a history of genital herpes comes to urgent care with complaints of perineal burning pain with rashes in the perineum and in the anal area.  Symptoms started 3 days ago.  Pain is severe, burning in nature.  No relieving factors.  No known aggravating factors.  Is associated with some burning on micturition.  Patient also has fishy smelling vaginal discharge.  Patient has some lower abdominal pain.  No nausea or vomiting.  HPI  Past Medical History:  Diagnosis Date  . Anemia   . Bipolar 1 disorder (HCC)   . Chlamydia   . Chronic abdominal pain   . Chronic nausea   . Deliberate self-cutting   . History of substance abuse (HCC)   . HSV-2 infection 2015  . Hypertension   . Infection    MRSA in 1995, negative since  . Schizophrenia (HCC)   . Seizures (HCC)    Not recently  . Sickle cell trait (HCC)   . Trichomonas infection     Patient Active Problem List   Diagnosis Date Noted  . Abdominal pain 12/02/2017  . Back pain affecting pregnancy in third trimester 03/20/2017  . Labor, premature with delivery 03/20/2017  . Preterm labor in third trimester 03/03/2017  . Chronic hypertension in pregnancy 02/21/2017  . GERD (gastroesophageal reflux disease) 02/21/2017  . Umbilical hernia 01/13/2017  . Hereditary disease in family possibly affecting fetus, fetus 1   . Penicillin allergy 10/02/2016  . Opiate use 09/25/2016  . Supervision of high risk pregnancy, antepartum 09/19/2016  . History of herpes genitalis 09/19/2016  . History of trichomoniasis 09/19/2016  . Advanced maternal age in multigravida, second trimester 09/19/2016  . Grand multiparity 09/19/2016  . Short interval between pregnancies affecting pregnancy in first trimester, antepartum 09/19/2016  .  Dichorionic diamniotic twin gestation 09/19/2016  . Chronic hypertension 07/03/2016  . Tobacco abuse 12/19/2014  . Bipolar disorder (HCC) 12/19/2014  . History of substance abuse (HCC) 12/19/2014  . Sickle cell trait (HCC) 12/19/2014    Past Surgical History:  Procedure Laterality Date  . CESAREAN SECTION N/A 03/21/2017   Procedure: CESAREAN SECTION;  Surgeon: [place] BingPickens, Charlie, MD;  [facility];  Service: Obstetrics;  Laterality: N/A;  . DILATION AND CURETTAGE OF UTERUS    . HEMORROIDECTOMY  2010  . plastic surgery on face    . UMBILICAL HERNIA REPAIR N/A 12/02/2017   Procedure: LAPAROSCOPIC EXCISION OF MESH ERAS PATHWAY;  Surgeon: Kinsinger, De BlanchLuke Aaron, MD;  Location: [facility] OR[facility];  Service: General;  Laterality: N/A;    OB History    Gravida  15   Para  9   Term  8   Preterm  1   AB  6   Living  10     SAB  6   TAB  0   Ectopic  0   Multiple  1   Live Births  10            Home Medications    Prior to Admission medications   Medication Sig Start Date End Date Taking? Authorizing Provider  acyclovir (ZOVIRAX) 400 MG tablet Take 2 tablets (800 mg total) by mouth 2 (two) times daily for 5 days. 09/17/18 09/22/18  ,  Britta MccreedyPhilip O, MD  metroNIDAZOLE (FLAGYL) 500 MG tablet Take 1 tablet (500 mg total) by mouth 2 (two) times daily. 09/17/18   , Britta MccreedyPhilip O, MD    Family History Family History  Problem Relation Age of Onset  . Cancer Mother   . Heart failure Mother   . Hypertension Mother   . Stroke Mother   . Diabetes Mother   . Hypertension Maternal Grandmother   . Anesthesia problems Neg Hx     Social History Social History   Tobacco Use  . Smoking status: Former Smoker    Packs/day: 0.50    Years: 1.50    Pack years: 0.75    Types: Cigarettes    Quit date: 04/09/2015    Years since quitting: 3.4  . Smokeless tobacco: Never Used  Substance Use Topics  . Alcohol use: No    Comment: hx drug use  . Drug use: No    Types:  Marijuana, Cocaine    Comment: past use, 4 years ago     Allergies   Peanut-containing drug products, Amoxicillin, and Latex   Review of Systems Review of Systems  Constitutional: Negative for activity change, appetite change, chills, fatigue and fever.  HENT: Negative.   Respiratory: Negative for choking, chest tightness, shortness of breath and wheezing.   Gastrointestinal: Positive for abdominal pain. Negative for constipation, diarrhea, nausea and vomiting.  Genitourinary: Positive for dysuria, genital sores and pelvic pain. Negative for frequency and urgency.  Musculoskeletal: Negative.   Skin: Negative.   Neurological: Negative.   Psychiatric/Behavioral: Negative.   All other systems reviewed and are negative.    Physical Exam Triage Vital Signs ED Triage Vitals  Enc Vitals Group     BP 09/17/18 2015 129/90     Pulse Rate 09/17/18 2015 65     Resp 09/17/18 2015 16     Temp 09/17/18 2015 98.6 F (37 C)     Temp src --      SpO2 09/17/18 2015 100 %     Weight 09/17/18 2016 150 lb (68 kg)     Height --      Head Circumference --      Peak Flow --      Pain Score 09/17/18 2016 10     Pain Loc --      Pain Edu? --      Excl. in GC? --    No data found.  Updated Vital Signs BP 129/90 (BP Location: Right Arm)   Pulse 65   Temp 98.6 F (37 C)   Resp 16   Wt 68 kg   LMP 09/08/2018   SpO2 100%   BMI 26.57 kg/m   Visual Acuity Right Eye Distance:   Left Eye Distance:   Bilateral Distance:    Right Eye Near:   Left Eye Near:    Bilateral Near:     Physical Exam Cardiovascular:     Rate and Rhythm: Normal rate and regular rhythm.     Pulses: Normal pulses.     Heart sounds: Normal heart sounds.  Pulmonary:     Effort: Pulmonary effort is normal.     Breath sounds: Normal breath sounds.  Abdominal:     General: Bowel sounds are normal.     Palpations: Abdomen is soft.  Skin:    General: Skin is warm.      UC Treatments / Results  Labs (all  labs ordered are listed, but only abnormal results are displayed) Labs Reviewed -  No data to display  EKG None  Radiology No results found.  Procedures Procedures (including critical care time)  Medications Ordered in UC Medications - No data to display  Initial Impression / Assessment and Plan / UC Course  I have reviewed the triage vital signs and the nursing notes.  Pertinent labs & imaging results that were available during my care of the patient were reviewed by me and considered in my medical decision making (see chart for details).     1.  Genital herpes outbreak: Acyclovir 800 mg twice daily for 5 days Patient previously tried valacyclovir but did not find that helpful so she wanted to try another agent.  Medication compliance has been emphasized.  2.  Bacterial vaginosis: Metronidazole 500 mg twice daily for 7 days Urinalysis was negative for UTI  Final Clinical Impressions(s) / UC Diagnoses   Final diagnoses:  Herpes simplex vulvovaginitis  Bacterial vaginosis   Discharge Instructions   None    ED Prescriptions    Medication Sig Dispense Auth. Provider   acyclovir (ZOVIRAX) 400 MG tablet Take 2 tablets (800 mg total) by mouth 2 (two) times daily for 5 days. 20 tablet , Myrene Galas, MD   metroNIDAZOLE (FLAGYL) 500 MG tablet Take 1 tablet (500 mg total) by mouth 2 (two) times daily. 14 tablet , Myrene Galas, MD     Controlled Substance Prescriptions [place] Controlled Substance Registry consulted? No   Chase Picket, MD 09/18/18 1014

## 2018-09-17 NOTE — ED Triage Notes (Signed)
Pt states she has pelvis pain and she ha painful bumps around her rectum. X 3 days. Pt states she has been dx with herpes a while back.

## 2018-10-26 ENCOUNTER — Ambulatory Visit (HOSPITAL_COMMUNITY)
Admission: EM | Admit: 2018-10-26 | Discharge: 2018-10-26 | Disposition: A | Discharge status: Home / Self Care | Payer: Medicaid Other | Attending: Family Medicine | Admitting: Family Medicine

## 2018-10-26 ENCOUNTER — Encounter (HOSPITAL_COMMUNITY): Payer: Self-pay

## 2018-10-26 ENCOUNTER — Other Ambulatory Visit: Payer: Self-pay

## 2018-10-26 DIAGNOSIS — F319 Bipolar disorder, unspecified: Secondary | ICD-10-CM | POA: Diagnosis not present

## 2018-10-26 DIAGNOSIS — Z1159 Encounter for screening for other viral diseases: Secondary | ICD-10-CM | POA: Diagnosis not present

## 2018-10-26 DIAGNOSIS — I10 Essential (primary) hypertension: Secondary | ICD-10-CM | POA: Insufficient documentation

## 2018-10-26 DIAGNOSIS — Z809 Family history of malignant neoplasm, unspecified: Secondary | ICD-10-CM | POA: Diagnosis not present

## 2018-10-26 DIAGNOSIS — Z3202 Encounter for pregnancy test, result negative: Secondary | ICD-10-CM

## 2018-10-26 DIAGNOSIS — Z79899 Other long term (current) drug therapy: Secondary | ICD-10-CM | POA: Insufficient documentation

## 2018-10-26 DIAGNOSIS — Z87891 Personal history of nicotine dependence: Secondary | ICD-10-CM | POA: Diagnosis not present

## 2018-10-26 DIAGNOSIS — N898 Other specified noninflammatory disorders of vagina: Secondary | ICD-10-CM | POA: Insufficient documentation

## 2018-10-26 DIAGNOSIS — Z79891 Long term (current) use of opiate analgesic: Secondary | ICD-10-CM | POA: Insufficient documentation

## 2018-10-26 DIAGNOSIS — J029 Acute pharyngitis, unspecified: Secondary | ICD-10-CM | POA: Diagnosis present

## 2018-10-26 DIAGNOSIS — Z7952 Long term (current) use of systemic steroids: Secondary | ICD-10-CM | POA: Insufficient documentation

## 2018-10-26 DIAGNOSIS — F209 Schizophrenia, unspecified: Secondary | ICD-10-CM | POA: Insufficient documentation

## 2018-10-26 DIAGNOSIS — Z91010 Allergy to peanuts: Secondary | ICD-10-CM | POA: Insufficient documentation

## 2018-10-26 DIAGNOSIS — Z880 Allergy status to penicillin: Secondary | ICD-10-CM | POA: Insufficient documentation

## 2018-10-26 DIAGNOSIS — Z8249 Family history of ischemic heart disease and other diseases of the circulatory system: Secondary | ICD-10-CM | POA: Insufficient documentation

## 2018-10-26 DIAGNOSIS — Z202 Contact with and (suspected) exposure to infections with a predominantly sexual mode of transmission: Secondary | ICD-10-CM | POA: Diagnosis present

## 2018-10-26 DIAGNOSIS — Z833 Family history of diabetes mellitus: Secondary | ICD-10-CM | POA: Diagnosis not present

## 2018-10-26 DIAGNOSIS — Z91040 Latex allergy status: Secondary | ICD-10-CM | POA: Diagnosis not present

## 2018-10-26 LAB — POCT PREGNANCY, URINE: Preg Test, Ur: NEGATIVE

## 2018-10-26 MED ORDER — CEFTRIAXONE SODIUM 250 MG IJ SOLR
250.0000 mg | Freq: Once | INTRAMUSCULAR | Status: AC
  Administered 2018-10-26: 250 mg via INTRAMUSCULAR

## 2018-10-26 MED ORDER — AZITHROMYCIN 250 MG PO TABS
ORAL_TABLET | ORAL | Status: AC
  Filled 2018-10-26: qty 4

## 2018-10-26 MED ORDER — FLUCONAZOLE 150 MG PO TABS: 150.0000 mg | ORAL_TABLET | Freq: Every day | ORAL | 0 refills | Status: DC

## 2018-10-26 MED ORDER — AZITHROMYCIN 250 MG PO TABS
1000.0000 mg | ORAL_TABLET | Freq: Once | ORAL | Status: AC
  Administered 2018-10-26: 14:00:00 1000 mg via ORAL

## 2018-10-26 MED ORDER — METRONIDAZOLE 500 MG PO TABS: 500.0000 mg | ORAL_TABLET | Freq: Two times a day (BID) | ORAL | 0 refills | Status: DC

## 2018-10-26 MED ORDER — CEFTRIAXONE SODIUM 250 MG IJ SOLR
INTRAMUSCULAR | Status: AC
  Filled 2018-10-26: qty 250

## 2018-10-26 MED ORDER — LIDOCAINE HCL 2 % IJ SOLN
INTRAMUSCULAR | Status: AC
  Filled 2018-10-26: qty 20

## 2018-10-26 NOTE — Discharge Instructions (Addendum)
We are sending testing all for STDs, BV and yeast today.  We are also testing you for HIV and syphilis. COVID testing done Treating you today for gonorrhea, chlamydia and sending prescriptions to the pharmacy for bacterial vaginosis and yeast infection We will call you with any positive results

## 2018-10-26 NOTE — ED Provider Notes (Signed)
[facility]    CSN: [id number] Arrival date & time: 10/26/18  1315     History   Chief Complaint Chief Complaint  Patient presents with  . SEXUALLY TRANSMITTED DISEASE    HPI Malachy Moodssey S Emmanuel is a 38 y.o. female.   Patient is a 38 year old female with past medical history of anemia, bipolar, chlamydia, substance abuse, HSV, hypertension, schizophrenia, sickle cell trait, trichomonas.  She presents today with approximately 3 days of lower abdominal discomfort, vaginal discharge, sore throat.  Describes a discharge as milky, yellowish/white with fishy odor.  She is currently sexually active with her husband.  Reporting that he was told by another partner that they were positive for gonorrhea and chlamydia.  She is requesting treatment for STDs.  She has had mild sore throat pain with swallowing.  Denies any associated fever, chills, body aches, night sweats.  Denies any cough, chest congestion, rhinorrhea or ear pain. She would like to be COVID tested.   ROS per HPI      Past Medical History:  Diagnosis Date  . Anemia   . Bipolar 1 disorder (HCC)   . Chlamydia   . Chronic abdominal pain   . Chronic nausea   . Deliberate self-cutting   . History of substance abuse (HCC)   . HSV-2 infection 2015  . Hypertension   . Infection    MRSA in 1995, negative since  . Schizophrenia (HCC)   . Seizures (HCC)    Not recently  . Sickle cell trait (HCC)   . Trichomonas infection     Patient Active Problem List   Diagnosis Date Noted  . Abdominal pain 12/02/2017  . Back pain affecting pregnancy in third trimester 03/20/2017  . Labor, premature with delivery 03/20/2017  . Preterm labor in third trimester 03/03/2017  . Chronic hypertension in pregnancy 02/21/2017  . GERD (gastroesophageal reflux disease) 02/21/2017  . Umbilical hernia 01/13/2017  . Hereditary disease in family possibly affecting fetus, fetus 1   . Penicillin allergy 10/02/2016  . Opiate use 09/25/2016   . Supervision of high risk pregnancy, antepartum 09/19/2016  . History of herpes genitalis 09/19/2016  . History of trichomoniasis 09/19/2016  . Advanced maternal age in multigravida, second trimester 09/19/2016  . [place] multiparity 09/19/2016  . Short interval between pregnancies affecting pregnancy in first trimester, antepartum 09/19/2016  . Dichorionic diamniotic twin gestation 09/19/2016  . Chronic hypertension 07/03/2016  . Tobacco abuse 12/19/2014  . Bipolar disorder (HCC) 12/19/2014  . History of substance abuse (HCC) 12/19/2014  . Sickle cell trait (HCC) 12/19/2014    Past Surgical History:  Procedure Laterality Date  . CESAREAN SECTION N/A 03/21/2017   Procedure: CESAREAN SECTION;  Surgeon: Hennessey BingPickens, Charlie, MD;  [facility];  Service: Obstetrics;  Laterality: N/A;  . DILATION AND CURETTAGE OF UTERUS    . HEMORROIDECTOMY  2010  . plastic surgery on face    . UMBILICAL HERNIA REPAIR N/A 12/02/2017   Procedure: LAPAROSCOPIC EXCISION OF MESH ERAS PATHWAY;  Surgeon: Kinsinger, De BlanchLuke Aaron, MD;  Location: [facility];  Service: General;  Laterality: N/A;    OB History    Gravida  15   Para  9   Term  8   Preterm  1   AB  6   Living  10     SAB  6   TAB  0   Ectopic  0   Multiple  1   Live Births  10  Home Medications    Prior to Admission medications   Medication Sig Start Date End Date Taking? Authorizing Provider  escitalopram (LEXAPRO) 10 MG tablet TK 1 T PO D 08/22/18   [provider]  fluconazole (DIFLUCAN) 150 MG tablet Take 1 tablet (150 mg total) by mouth daily. 10/26/18   Loura Halt A, NP  metroNIDAZOLE (FLAGYL) 500 MG tablet Take 1 tablet (500 mg total) by mouth 2 (two) times daily. 10/26/18   Loura Halt A, NP  predniSONE (DELTASONE) 10 MG tablet Take 10 mg by mouth daily. 10/15/18   [provider]  SUBOXONE 8-2 MG FILM PLACE 1 FILM UNDER THE TONGUE TID 10/16/18   [provider]    Family  History Family History  Problem Relation Age of Onset  . Cancer Mother   . Heart failure Mother   . Hypertension Mother   . Stroke Mother   . Diabetes Mother   . Healthy Father   . Hypertension Maternal Grandmother   . Anesthesia problems Neg Hx     Social History Social History   Tobacco Use  . Smoking status: Former Smoker    Packs/day: 0.50    Years: 1.50    Pack years: 0.75    Types: Cigarettes    Quit date: 04/09/2015    Years since quitting: 3.5  . Smokeless tobacco: Never Used  Substance Use Topics  . Alcohol use: No    Comment: hx drug use  . Drug use: No    Types: Marijuana, Cocaine    Comment: past use, 4 years ago     Allergies   Peanut-containing drug products, Amoxicillin, and Latex   Review of Systems Review of Systems   Physical Exam Triage Vital Signs ED Triage Vitals  Enc Vitals Group     BP 10/26/18 1337 (!) 147/86     Pulse Rate 10/26/18 1337 73     Resp 10/26/18 1337 17     Temp 10/26/18 1337 98.7 F (37.1 C)     Temp Source 10/26/18 1337 Oral     SpO2 10/26/18 1337 100 %     Weight --      Height --      Head Circumference --      Peak Flow --      Pain Score 10/26/18 1334 5     Pain Loc --      Pain Edu? --      Excl. in [place]? --    No data found.  Updated Vital Signs BP (!) 147/86 (BP Location: Right Arm)   Pulse 73   Temp 98.7 F (37.1 C) (Oral)   Resp 17   SpO2 100%   Visual Acuity Right Eye Distance:   Left Eye Distance:   Bilateral Distance:    Right Eye Near:   Left Eye Near:    Bilateral Near:     Physical Exam Vitals signs and nursing note reviewed.  Constitutional:      General: She is not in acute distress.    Appearance: Normal appearance. She is not ill-appearing, toxic-appearing or diaphoretic.  HENT:     Head: Normocephalic.     Nose: Nose normal.     Mouth/Throat:     Pharynx: Oropharynx is clear. No posterior oropharyngeal erythema.  Eyes:     Conjunctiva/sclera: Conjunctivae normal.  Neck:      Musculoskeletal: Normal range of motion.  Pulmonary:     Effort: Pulmonary effort is normal.  Abdominal:  Palpations: Abdomen is soft.     Tenderness: There is no abdominal tenderness.  Musculoskeletal: Normal range of motion.  Lymphadenopathy:     Cervical: No cervical adenopathy.  Skin:    General: Skin is warm and dry.     Findings: No rash.  Neurological:     Mental Status: She is alert.  Psychiatric:        Mood and Affect: Mood normal.      UC Treatments / Results  Labs (all labs ordered are listed, but only abnormal results are displayed) Labs Reviewed  NOVEL CORONAVIRUS, NAA (HOSPITAL ORDER, SEND-OUT TO REF LAB)  HIV ANTIBODY (ROUTINE TESTING W REFLEX)  RPR  POC URINE PREG, ED  CERVICOVAGINAL ANCILLARY ONLY    EKG   Radiology No results found.  Procedures Procedures (including critical care time)  Medications Ordered in UC Medications  cefTRIAXone (ROCEPHIN) injection 250 mg (250 mg Intramuscular Given 10/26/18 1409)  azithromycin (ZITHROMAX) tablet 1,000 mg (1,000 mg Oral Given 10/26/18 1409)  azithromycin (ZITHROMAX) 250 MG tablet (has no administration in time range)  cefTRIAXone (ROCEPHIN) 250 MG injection (has no administration in time range)  lidocaine (XYLOCAINE) 2 % (with pres) injection (has no administration in time range)    Initial Impression / Assessment and Plan / UC Course  I have reviewed the triage vital signs and the nursing notes.  Pertinent labs & imaging results that were available during my care of the patient were reviewed by me and considered in my medical decision making (see chart for details).     STD exposure-sending swab for testing and treating for gonorrhea and chlamydia today. Also treating for bacterial vaginosis and yeast with Flagyl and Diflucan.  This also covers for trichomonas Also tested for HIV and syphilis COVID testing done as requested Follow up as needed for continued or worsening symptoms  Final  Clinical Impressions(s) / UC Diagnoses   Final diagnoses:  STD exposure  Sore throat  Vaginal discharge     Discharge Instructions     We are sending testing all for STDs, BV and yeast today.  We are also testing you for HIV and syphilis. COVID testing done Treating you today for gonorrhea, chlamydia and sending prescriptions to the pharmacy for bacterial vaginosis and yeast infection We will call you with any positive results    ED Prescriptions    Medication Sig Dispense Auth. Provider   metroNIDAZOLE (FLAGYL) 500 MG tablet Take 1 tablet (500 mg total) by mouth 2 (two) times daily. 14 tablet Akiah Bauch A, NP   fluconazole (DIFLUCAN) 150 MG tablet Take 1 tablet (150 mg total) by mouth daily. 2 tablet Dahlia ByesBast, Jahziah Simonin A, NP     Controlled Substance Prescriptions [place] Controlled Substance Registry consulted? Not Applicable   Janace ArisBast, Grier Czerwinski A, NP 10/26/18 [phone]

## 2018-10-26 NOTE — ED Triage Notes (Signed)
Patient presents to Urgent Care with complaints of needing STD testing since her husband cheated her recently. Patient reports the other woman tested positive gonorrhea and chlamydia. Pt has been having symptoms, would also like HIV testing and COVID testing.

## 2018-10-27 LAB — HIV ANTIBODY (ROUTINE TESTING W REFLEX): HIV Screen 4th Generation wRfx: NONREACTIVE

## 2018-10-27 LAB — RPR: RPR Ser Ql: NONREACTIVE

## 2018-10-28 LAB — NOVEL CORONAVIRUS, NAA (HOSP ORDER, SEND-OUT TO REF LAB; TAT 18-24 HRS): SARS-CoV-2, NAA: NOT DETECTED

## 2018-10-30 LAB — CERVICOVAGINAL ANCILLARY ONLY
Bacterial vaginitis: POSITIVE — AB
Candida vaginitis: NEGATIVE
Chlamydia: NEGATIVE
Neisseria Gonorrhea: NEGATIVE
Trichomonas: NEGATIVE

## 2018-11-01 ENCOUNTER — Telehealth (HOSPITAL_COMMUNITY): Payer: Self-pay | Admitting: Emergency Medicine

## 2018-11-01 NOTE — Telephone Encounter (Signed)
Patient contacted and made aware of all results from visit on 7/20  all questions answered.

## 2019-02-08 ENCOUNTER — Other Ambulatory Visit: Payer: Self-pay

## 2019-02-08 ENCOUNTER — Encounter (HOSPITAL_COMMUNITY): Payer: Self-pay

## 2019-02-08 ENCOUNTER — Ambulatory Visit (HOSPITAL_COMMUNITY)
Admission: EM | Admit: 2019-02-08 | Discharge: 2019-02-08 | Disposition: A | Discharge status: Home / Self Care | Payer: Medicaid Other | Attending: Emergency Medicine | Admitting: Emergency Medicine

## 2019-02-08 DIAGNOSIS — Z3202 Encounter for pregnancy test, result negative: Secondary | ICD-10-CM

## 2019-02-08 DIAGNOSIS — Z202 Contact with and (suspected) exposure to infections with a predominantly sexual mode of transmission: Secondary | ICD-10-CM | POA: Diagnosis not present

## 2019-02-08 DIAGNOSIS — N898 Other specified noninflammatory disorders of vagina: Secondary | ICD-10-CM | POA: Diagnosis not present

## 2019-02-08 LAB — HIV ANTIBODY (ROUTINE TESTING W REFLEX): HIV Screen 4th Generation wRfx: NONREACTIVE

## 2019-02-08 LAB — POCT URINALYSIS DIP (DEVICE)
Bilirubin Urine: NEGATIVE
Glucose, UA: NEGATIVE mg/dL
Hgb urine dipstick: NEGATIVE
Ketones, ur: NEGATIVE mg/dL
Leukocytes,Ua: NEGATIVE
Nitrite: NEGATIVE
Protein, ur: NEGATIVE mg/dL
Specific Gravity, Urine: >=1.03 (ref 1.005–1.030)
Urobilinogen, UA: 2 mg/dL — ABNORMAL HIGH (ref 0.0–1.0)
pH: 7 (ref 5.0–8.0)

## 2019-02-08 LAB — POCT PREGNANCY, URINE: Preg Test, Ur: NEGATIVE

## 2019-02-08 MED ORDER — METRONIDAZOLE 500 MG PO TABS: 500.0000 mg | ORAL_TABLET | Freq: Two times a day (BID) | ORAL | 0 refills | Status: DC

## 2019-02-08 MED ORDER — AZITHROMYCIN 250 MG PO TABS
1000.0000 mg | ORAL_TABLET | Freq: Once | ORAL | Status: AC
  Administered 2019-02-08: 1000 mg via ORAL

## 2019-02-08 MED ORDER — LIDOCAINE HCL (PF) 1 % IJ SOLN
INTRAMUSCULAR | Status: AC
  Filled 2019-02-08: qty 30

## 2019-02-08 MED ORDER — CEFTRIAXONE SODIUM 250 MG IJ SOLR
INTRAMUSCULAR | Status: AC
  Filled 2019-02-08: qty 250

## 2019-02-08 MED ORDER — CEFTRIAXONE SODIUM 250 MG IJ SOLR
250.0000 mg | Freq: Once | INTRAMUSCULAR | Status: AC
  Administered 2019-02-08: 250 mg via INTRAMUSCULAR

## 2019-02-08 MED ORDER — AZITHROMYCIN 250 MG PO TABS
ORAL_TABLET | ORAL | Status: AC
  Filled 2019-02-08: qty 4

## 2019-02-08 NOTE — ED Triage Notes (Signed)
Pt present to the UC for STD test, she is concern as her husband was cheating on her. Pt states having vaginal pain, irritation and itchiness x 3 days. Pt states having white vaginal with a strong odor x 3 days. Pt states having lower abdominal pain x 3 days.

## 2019-02-08 NOTE — Discharge Instructions (Signed)
You were treated with two antibiotics today, Rocephin and Zithromax.  Additionally, you are prescribed metronidazole; take this twice a day for 7 days.   ° °Do not have sex for 7 days. ° °Your STD tests are pending.  If your test results are positive, we will call you.  You may need additional treatment and your partner may also need treatment.     ° °

## 2019-02-08 NOTE — ED Provider Notes (Signed)
[facility]    CSN: [id number] Arrival date & time: 02/08/19  1700      History   Chief Complaint Chief Complaint  Patient presents with  . Exposure to STD  . Vaginal Discharge  . Vaginal Pain  . Abdominal Pain  . Dysuria    HPI [name] is a 38 y.o. female.   Patient presents with a 3-day history of vaginal irritation, itching, white vaginal discharge, and suprapubic abdominal pain.  She denies fever, chills, dysuria, back pain, pelvic pain, rash, lesions, or other symptoms.  Patient states her husband has been unfaithful.  She request STD testing and treatment.  No treatments attempted at home.  Patient has a history of chlamydia, trichomonas, BV, HSV.  The history is provided by the patient.    Past Medical History:  Diagnosis Date  . Anemia   . Bipolar 1 disorder ([place])   . Chlamydia   . Chronic abdominal pain   . Chronic nausea   . Deliberate self-cutting   . History of substance abuse ([place])   . HSV-2 infection 2015  . Hypertension   . Infection    MRSA in 1995, negative since  . Schizophrenia ([place])   . Seizures ([place])    Not recently  . Sickle cell trait ([place])   . Trichomonas infection     Patient Active Problem List   Diagnosis Date Noted  . Abdominal pain 12/02/2017  . Back pain affecting pregnancy in third trimester 03/20/2017  . Labor, premature with delivery 03/20/2017  . Preterm labor in third trimester 03/03/2017  . Chronic hypertension in pregnancy 02/21/2017  . GERD (gastroesophageal reflux disease) 02/21/2017  . Umbilical hernia 19/62/2297  . Hereditary disease in family possibly affecting fetus, fetus 50   . Penicillin allergy 10/02/2016  . Opiate use 09/25/2016  . Supervision of high risk pregnancy, antepartum 09/19/2016  . History of herpes genitalis 09/19/2016  . History of trichomoniasis 09/19/2016  . Advanced maternal age in multigravida, second trimester 09/19/2016  . [place] multiparity 09/19/2016  . Short interval  between pregnancies affecting pregnancy in first trimester, antepartum 09/19/2016  . Dichorionic diamniotic twin gestation 09/19/2016  . Chronic hypertension 07/03/2016  . Tobacco abuse 12/19/2014  . Bipolar disorder ([place]) 12/19/2014  . History of substance abuse ([place]) 12/19/2014  . Sickle cell trait ([place]) 12/19/2014    Past Surgical History:  Procedure Laterality Date  . CESAREAN SECTION N/A 03/21/2017   Procedure: CESAREAN SECTION;  Surgeon: Aletha Halim, MD;  Location: [facility];  Service: Obstetrics;  Laterality: N/A;  . DILATION AND CURETTAGE OF UTERUS    . HEMORROIDECTOMY  2010  . plastic surgery on face    . UMBILICAL HERNIA REPAIR N/A 12/02/2017   Procedure: LAPAROSCOPIC EXCISION OF MESH ERAS PATHWAY;  Surgeon: Kinsinger, Arta Bruce, MD;  Location: [facility];  Service: General;  Laterality: N/A;    OB History    Gravida  15   Para  9   Term  8   Preterm  1   AB  6   Living  10     SAB  6   TAB  0   Ectopic  0   Multiple  1   Live Births  10            Home Medications    Prior to Admission medications   Medication Sig Start Date End Date Taking? Authorizing Provider  escitalopram (LEXAPRO) 10 MG tablet TK 1 T PO D 08/22/18  [provider]  fluconazole (DIFLUCAN) 150 MG tablet Take 1 tablet (150 mg total) by mouth daily. 10/26/18   Loura Halt A, NP  megestrol (MEGACE) 20 MG tablet Take 20 mg by mouth 3 (three) times daily. 01/13/19   [provider]  metroNIDAZOLE (FLAGYL) 500 MG tablet Take 1 tablet (500 mg total) by mouth 2 (two) times daily. 02/08/19   Sharion Balloon, NP  NARCAN 4 MG/0.1ML LIQD nasal spray kit INSTILL 1 SPRAY IN NOSTRIL PRN 09/10/18   [provider]  predniSONE (DELTASONE) 10 MG tablet Take 10 mg by mouth daily. 10/15/18   [provider]  SUBOXONE 8-2 MG FILM PLACE 1 FILM UNDER THE TONGUE TID 10/16/18   [provider]    Family History Family History  Problem Relation Age of  Onset  . Cancer Mother   . Heart failure Mother   . Hypertension Mother   . Stroke Mother   . Diabetes Mother   . Healthy Father   . Hypertension Maternal Grandmother   . Anesthesia problems Neg Hx     Social History Social History   Tobacco Use  . Smoking status: Former Smoker    Packs/day: 0.50    Years: 1.50    Pack years: 0.75    Types: Cigarettes    Quit date: 04/09/2015    Years since quitting: 3.8  . Smokeless tobacco: Never Used  Substance Use Topics  . Alcohol use: No    Comment: hx drug use  . Drug use: No    Types: Marijuana, Cocaine    Comment: past use, 4 years ago     Allergies   Peanut-containing drug products, Amoxicillin, and Latex   Review of Systems Review of Systems  Constitutional: Negative for chills and fever.  HENT: Negative for ear pain and sore throat.   Eyes: Negative for pain and visual disturbance.  Respiratory: Negative for cough and shortness of breath.   Cardiovascular: Negative for chest pain and palpitations.  Gastrointestinal: Positive for abdominal pain. Negative for vomiting.  Genitourinary: Positive for vaginal discharge. Negative for dysuria, flank pain, hematuria and pelvic pain.  Musculoskeletal: Negative for arthralgias and back pain.  Skin: Negative for color change and rash.  Neurological: Negative for seizures and syncope.  All other systems reviewed and are negative.    Physical Exam Triage Vital Signs ED Triage Vitals  Enc Vitals Group     BP 02/08/19 1730 130/78     Pulse Rate 02/08/19 1730 75     Resp 02/08/19 1730 15     Temp 02/08/19 1730 98.9 F (37.2 C)     Temp Source 02/08/19 1730 Oral     SpO2 02/08/19 1730 97 %     Weight --      Height --      Head Circumference --      Peak Flow --      Pain Score 02/08/19 1726 9     Pain Loc --      Pain Edu? --      Excl. in [place]? --    No data found.  Updated Vital Signs BP 130/78 (BP Location: Left Arm)   Pulse 75   Temp 98.9 F (37.2 C) (Oral)    Resp 15   LMP  (Within Weeks)   SpO2 97%   Visual Acuity Right Eye Distance:   Left Eye Distance:   Bilateral Distance:    Right Eye Near:   Left Eye Near:  Bilateral Near:     Physical Exam Vitals signs and nursing note reviewed.  Constitutional:      General: She is not in acute distress.    Appearance: She is well-developed.  HENT:     Head: Normocephalic and atraumatic.     Mouth/Throat:     Mouth: Mucous membranes are moist.     Pharynx: Oropharynx is clear.  Eyes:     Conjunctiva/sclera: Conjunctivae normal.  Neck:     Musculoskeletal: Neck supple.  Cardiovascular:     Rate and Rhythm: Normal rate and regular rhythm.     Heart sounds: No murmur.  Pulmonary:     Effort: Pulmonary effort is normal. No respiratory distress.     Breath sounds: Normal breath sounds.  Abdominal:     General: Bowel sounds are normal.     Palpations: Abdomen is soft.     Tenderness: There is no abdominal tenderness. There is no right CVA tenderness, left CVA tenderness, guarding or rebound.  Skin:    General: Skin is warm and dry.  Neurological:     Mental Status: She is alert.      UC Treatments / Results  Labs (all labs ordered are listed, but only abnormal results are displayed) Labs Reviewed  POCT URINALYSIS DIP (DEVICE) - Abnormal; Notable for the following components:      Result Value   Urobilinogen, UA 2.0 (*)    All other components within normal limits  HIV ANTIBODY (ROUTINE TESTING W REFLEX)  RPR  POCT PREGNANCY, URINE  CERVICOVAGINAL ANCILLARY ONLY    EKG   Radiology No results found.  Procedures Procedures (including critical care time)  Medications Ordered in UC Medications  cefTRIAXone (ROCEPHIN) injection 250 mg (has no administration in time range)  azithromycin (ZITHROMAX) tablet 1,000 mg (has no administration in time range)    Initial Impression / Assessment and Plan / UC Course  I have reviewed the triage vital signs and the nursing  notes.  Pertinent labs & imaging results that were available during my care of the patient were reviewed by me and considered in my medical decision making (see chart for details).    Vaginal discharge, potential exposure to STD.  Urine pregnancy negative.  Vaginal self swab obtained by patient.  Treated with Rocephin, Zithromax, metronidazole.  Instructed patient not to have sex for 7 days.  Instructed her that we will call her if her STD tests are positive and that she and her partner may need treatment at that time.  Patient agrees to plan of care.     Final Clinical Impressions(s) / UC Diagnoses   Final diagnoses:  Vaginal discharge  Potential exposure to STD     Discharge Instructions     You were treated with two antibiotics today, Rocephin and Zithromax.  Additionally, you are prescribed metronidazole; take this twice a day for 7 days.    Do not have sex for 7 days.  Your STD tests are pending.  If your test results are positive, we will call you.  You may need additional treatment and your partner may also need treatment.         ED Prescriptions    Medication Sig Dispense Auth. Provider   metroNIDAZOLE (FLAGYL) 500 MG tablet Take 1 tablet (500 mg total) by mouth 2 (two) times daily. 14 tablet Sharion Balloon, NP     PDMP not reviewed this encounter.   Sharion Balloon, NP 02/08/19 1759

## 2019-02-09 LAB — RPR: RPR Ser Ql: NONREACTIVE

## 2019-02-12 ENCOUNTER — Telehealth (HOSPITAL_COMMUNITY): Payer: Self-pay | Admitting: Emergency Medicine

## 2019-02-12 ENCOUNTER — Other Ambulatory Visit: Payer: Self-pay

## 2019-02-12 ENCOUNTER — Ambulatory Visit (HOSPITAL_COMMUNITY)
Admission: EM | Admit: 2019-02-12 | Discharge: 2019-02-12 | Disposition: A | Discharge status: Home / Self Care | Payer: Medicaid Other | Attending: Internal Medicine | Admitting: Internal Medicine

## 2019-02-12 DIAGNOSIS — Z5321 Procedure and treatment not carried out due to patient leaving prior to being seen by health care provider: Secondary | ICD-10-CM | POA: Insufficient documentation

## 2019-02-12 LAB — CERVICOVAGINAL ANCILLARY ONLY
Bacterial vaginitis: NEGATIVE
Candida vaginitis: NEGATIVE
Chlamydia: NEGATIVE
Neisseria Gonorrhea: POSITIVE — AB
Trichomonas: NEGATIVE

## 2019-02-12 NOTE — Telephone Encounter (Signed)
Test for gonorrhea was positive. This was treated at the urgent care visit with IM rocephin 250mg and po zithromax 1g. Pt needs education to refrain from sexual intercourse for 7 days after treatment to give the medicine time to work. Sexual partners need to be notified and tested/treated. Condoms may reduce risk of reinfection. Recheck or followup with PCP for further evaluation if symptoms are not improving. [facility] notified.   Patient contacted and made aware of    results, all questions answered   

## 2019-02-12 NOTE — ED Notes (Signed)
Patient changed her mind and decided she did not want to be tested for COVID anymore, pt told registration that she was leaving.

## 2019-02-25 ENCOUNTER — Ambulatory Visit (HOSPITAL_COMMUNITY)
Admission: EM | Admit: 2019-02-25 | Discharge: 2019-02-25 | Disposition: A | Discharge status: Home / Self Care | Payer: Medicaid Other | Attending: Internal Medicine | Admitting: Internal Medicine

## 2019-02-25 ENCOUNTER — Encounter (HOSPITAL_COMMUNITY): Payer: Self-pay

## 2019-02-25 ENCOUNTER — Other Ambulatory Visit: Payer: Self-pay

## 2019-02-25 DIAGNOSIS — N760 Acute vaginitis: Secondary | ICD-10-CM | POA: Diagnosis not present

## 2019-02-25 DIAGNOSIS — Z20828 Contact with and (suspected) exposure to other viral communicable diseases: Secondary | ICD-10-CM

## 2019-02-25 DIAGNOSIS — Z20822 Contact with and (suspected) exposure to covid-19: Secondary | ICD-10-CM

## 2019-02-25 LAB — POC SARS CORONAVIRUS 2 AG -  ED
SARS Coronavirus 2 Ag: NEGATIVE
SARS Coronavirus 2 Ag: NEGATIVE

## 2019-02-25 LAB — POCT URINALYSIS DIP (DEVICE)
Bilirubin Urine: NEGATIVE
Glucose, UA: NEGATIVE mg/dL
Ketones, ur: NEGATIVE mg/dL
Leukocytes,Ua: NEGATIVE
Nitrite: NEGATIVE
Protein, ur: NEGATIVE mg/dL
Specific Gravity, Urine: >=1.03 (ref 1.005–1.030)
Urobilinogen, UA: 1 mg/dL (ref 0.0–1.0)
pH: 5.5 (ref 5.0–8.0)

## 2019-02-25 LAB — POC SARS CORONAVIRUS 2 AG: SARS Coronavirus 2 Ag: NEGATIVE

## 2019-02-25 MED ORDER — METRONIDAZOLE 500 MG PO TABS: 500.0000 mg | ORAL_TABLET | Freq: Two times a day (BID) | ORAL | 0 refills | Status: DC

## 2019-02-25 NOTE — ED Triage Notes (Addendum)
Pt. States she was exposed to Covid POSITIVE  thru work([facility] [employer]) yesterday. Also states she was here for a STD check on 02/08/2019 & wants more testing states she has no had any relief since her last visit. She has had consistant nausea and vomiting and pelvic pain.

## 2019-02-25 NOTE — ED Notes (Signed)
UA completed and resulted in Epic.

## 2019-02-25 NOTE — ED Provider Notes (Signed)
[facility]    CSN: [id number] Arrival date & time: 02/25/19  1009      History   Chief Complaint Chief Complaint  Patient presents with  . covid exposure  . s74    HPI [name] is a 38 y.o. female with a history of bipolar disorder-currently euthymic, history of substance abuse currently on Suboxone comes to urgent care requesting Covid testing after she was exposed to coworkers who were diagnosed with COVID-19 infection.  Patient had contact with coworkers 2 to 3 days ago.  On direct questioning she is not clear whether she has symptoms or not.  She denies any shortness of breath, generalized body aches or sore throat.  No loss of taste or smell.  Patient also complains of foul-smelling vaginal discharge.  The discharge is malodorous and discolored.  She denies any pruritus.  Patient wants STD screening done. HPI  Past Medical History:  Diagnosis Date  . Anemia   . Bipolar 1 disorder ([place])   . Chlamydia   . Chronic abdominal pain   . Chronic nausea   . Deliberate self-cutting   . History of substance abuse ([place])   . HSV-2 infection 2015  . Hypertension   . Infection    MRSA in 1995, negative since  . Schizophrenia ([place])   . Seizures ([place])    Not recently  . Sickle cell trait ([place])   . Trichomonas infection     Patient Active Problem List   Diagnosis Date Noted  . Abdominal pain 12/02/2017  . Back pain affecting pregnancy in third trimester 03/20/2017  . Labor, premature with delivery 03/20/2017  . Preterm labor in third trimester 03/03/2017  . Chronic hypertension in pregnancy 02/21/2017  . GERD (gastroesophageal reflux disease) 02/21/2017  . Umbilical hernia 14/48/1856  . Hereditary disease in family possibly affecting fetus, fetus 86   . Penicillin allergy 10/02/2016  . Opiate use 09/25/2016  . Supervision of high risk pregnancy, antepartum 09/19/2016  . History of herpes genitalis 09/19/2016  . History of trichomoniasis 09/19/2016  .  Advanced maternal age in multigravida, second trimester 09/19/2016  . [place] multiparity 09/19/2016  . Short interval between pregnancies affecting pregnancy in first trimester, antepartum 09/19/2016  . Dichorionic diamniotic twin gestation 09/19/2016  . Chronic hypertension 07/03/2016  . Tobacco abuse 12/19/2014  . Bipolar disorder ([place]) 12/19/2014  . History of substance abuse ([place]) 12/19/2014  . Sickle cell trait ([place]) 12/19/2014    Past Surgical History:  Procedure Laterality Date  . CESAREAN SECTION N/A 03/21/2017   Procedure: CESAREAN SECTION;  Surgeon: Aletha Halim, MD;  Location: [facility];  Service: Obstetrics;  Laterality: N/A;  . DILATION AND CURETTAGE OF UTERUS    . HEMORROIDECTOMY  2010  . plastic surgery on face    . UMBILICAL HERNIA REPAIR N/A 12/02/2017   Procedure: LAPAROSCOPIC EXCISION OF MESH ERAS PATHWAY;  Surgeon: Kinsinger, Arta Bruce, MD;  Location: [facility];  Service: General;  Laterality: N/A;    OB History    Gravida  15   Para  9   Term  8   Preterm  1   AB  6   Living  10     SAB  6   TAB  0   Ectopic  0   Multiple  1   Live Births  10            Home Medications    Prior to Admission medications   Medication Sig Start Date  End Date Taking? Authorizing Provider  escitalopram (LEXAPRO) 10 MG tablet TK 1 T PO D 08/22/18   [provider]  fluconazole (DIFLUCAN) 150 MG tablet Take 1 tablet (150 mg total) by mouth daily. 10/26/18   Loura Halt A, NP  megestrol (MEGACE) 20 MG tablet Take 20 mg by mouth 3 (three) times daily. 01/13/19   [provider]  metroNIDAZOLE (FLAGYL) 500 MG tablet Take 1 tablet (500 mg total) by mouth 2 (two) times daily. 02/25/19   Chase Picket, MD  NARCAN 4 MG/0.1ML LIQD nasal spray kit INSTILL 1 SPRAY IN NOSTRIL PRN 09/10/18   [provider]  predniSONE (DELTASONE) 10 MG tablet Take 10 mg by mouth daily. 10/15/18   [provider]  SUBOXONE 8-2 MG FILM PLACE 1  FILM UNDER THE TONGUE TID 10/16/18   [provider]    Family History Family History  Problem Relation Age of Onset  . Cancer Mother   . Heart failure Mother   . Hypertension Mother   . Stroke Mother   . Diabetes Mother   . Healthy Father   . Hypertension Maternal Grandmother   . Anesthesia problems Neg Hx     Social History Social History   Tobacco Use  . Smoking status: Former Smoker    Packs/day: 0.50    Years: 1.50    Pack years: 0.75    Types: Cigarettes    Quit date: 04/09/2015    Years since quitting: 3.8  . Smokeless tobacco: Never Used  Substance Use Topics  . Alcohol use: No    Comment: hx drug use  . Drug use: No    Types: Marijuana, Cocaine    Comment: past use, 4 years ago     Allergies   Peanut-containing drug products, Amoxicillin, and Latex   Review of Systems Review of Systems  Constitutional: Negative.   HENT: Negative.   Respiratory: Negative.   Gastrointestinal: Positive for nausea and vomiting.  Genitourinary: Positive for genital sores, vaginal discharge and vaginal pain. Negative for dyspareunia, dysuria, frequency, hematuria, pelvic pain, urgency and vaginal bleeding.  Musculoskeletal: Negative.   Skin: Negative.   Neurological: Negative for dizziness, weakness and light-headedness.     Physical Exam Triage Vital Signs ED Triage Vitals  Enc Vitals Group     BP 02/25/19 1056 (!) 160/90     Pulse Rate 02/25/19 1056 66     Resp 02/25/19 1056 18     Temp 02/25/19 1056 99.3 F (37.4 C)     Temp Source 02/25/19 1056 Oral     SpO2 02/25/19 1056 100 %     Weight 02/25/19 1054 160 lb (72.6 kg)     Height --      Head Circumference --      Peak Flow --      Pain Score 02/25/19 1054 9     Pain Loc --      Pain Edu? --      Excl. in [place]? --    No data found.  Updated Vital Signs BP (!) 160/90 (BP Location: Left Arm)   Pulse 66   Temp 99.3 F (37.4 C) (Oral)   Resp 18   Wt 72.6 kg   LMP 02/18/2019 (Exact Date)   SpO2  100%   Breastfeeding No   BMI 28.34 kg/m   Visual Acuity Right Eye Distance:   Left Eye Distance:   Bilateral Distance:    Right Eye Near:   Left Eye Near:  Bilateral Near:     Physical Exam Constitutional:      General: She is not in acute distress.    Appearance: Normal appearance. She is not ill-appearing.  Cardiovascular:     Rate and Rhythm: Normal rate and regular rhythm.     Pulses: Normal pulses.     Heart sounds: Normal heart sounds.  Pulmonary:     Effort: Pulmonary effort is normal.     Breath sounds: Normal breath sounds. No wheezing or rhonchi.  Abdominal:     General: Bowel sounds are normal.     Palpations: Abdomen is soft.     Tenderness: There is no abdominal tenderness. There is no rebound.     Hernia: No hernia is present.  Musculoskeletal: Normal range of motion.  Skin:    General: Skin is warm.     Capillary Refill: Capillary refill takes less than 2 seconds.     Coloration: Skin is not pale.     Findings: No bruising or erythema.  Neurological:     General: No focal deficit present.     Mental Status: She is alert and oriented to person, place, and time.      UC Treatments / Results  Labs (all labs ordered are listed, but only abnormal results are displayed) Labs Reviewed  POCT URINALYSIS DIP (DEVICE) - Abnormal; Notable for the following components:      Result Value   Hgb urine dipstick MODERATE (*)    All other components within normal limits  NOVEL CORONAVIRUS, NAA (HOSP ORDER, SEND-OUT TO REF LAB; TAT 18-24 HRS)  POC SARS CORONAVIRUS 2 AG -  ED  POC SARS CORONAVIRUS 2 AG  POC SARS CORONAVIRUS 2 AG -  ED  CERVICOVAGINAL ANCILLARY ONLY    EKG   Radiology No results found.  Procedures Procedures (including critical care time)  Medications Ordered in UC Medications - No data to display  Initial Impression / Assessment and Plan / UC Course  I have reviewed the triage vital signs and the nursing notes.  Pertinent labs &  imaging results that were available during my care of the patient were reviewed by me and considered in my medical decision making (see chart for details).     1.  Exposure to COVID-19 positive persons: Point-of-care COVID-19 test was negative COVID-19 PCR test has been sent out Patient is advised to self isolate until become from a trip test results are available If her symptoms worsen she is advised to return to urgent care to be reevaluated Patient is advised to push fluids  2.  Vulvovaginitis: Urinalysis shows specific gravity of greater than 1.030 and hemoglobin on dipstick Cultures have been sent Bacterial vaginosis suspected Metronidazole 500 mg twice daily Cervicovaginal sample sent for GC/chlamydia/trichomonas/bacterial vaginosis/vaginal yeast. Final Clinical Impressions(s) / UC Diagnoses   Final diagnoses:  Close exposure to COVID-19 virus  Vaginitis and vulvovaginitis   Discharge Instructions   None    ED Prescriptions    Medication Sig Dispense Auth. Provider   metroNIDAZOLE (FLAGYL) 500 MG tablet Take 1 tablet (500 mg total) by mouth 2 (two) times daily. 14 tablet Tamma Brigandi, Myrene Galas, MD     PDMP not reviewed this encounter.   Chase Picket, MD 02/27/19 2203

## 2019-02-26 LAB — URINE CULTURE: Culture: NO GROWTH

## 2019-02-27 LAB — NOVEL CORONAVIRUS, NAA (HOSP ORDER, SEND-OUT TO REF LAB; TAT 18-24 HRS): SARS-CoV-2, NAA: NOT DETECTED

## 2019-03-01 LAB — CERVICOVAGINAL ANCILLARY ONLY
Bacterial vaginitis: NEGATIVE
Candida vaginitis: NEGATIVE
Chlamydia: NEGATIVE
Neisseria Gonorrhea: NEGATIVE
Trichomonas: NEGATIVE

## 2019-03-07 ENCOUNTER — Encounter (HOSPITAL_COMMUNITY): Payer: Self-pay | Admitting: Emergency Medicine

## 2019-03-07 ENCOUNTER — Ambulatory Visit (HOSPITAL_COMMUNITY)
Admission: EM | Admit: 2019-03-07 | Discharge: 2019-03-07 | Disposition: A | Discharge status: Home / Self Care | Payer: Medicaid Other | Attending: Family Medicine | Admitting: Family Medicine

## 2019-03-07 ENCOUNTER — Other Ambulatory Visit: Payer: Self-pay

## 2019-03-07 DIAGNOSIS — N898 Other specified noninflammatory disorders of vagina: Secondary | ICD-10-CM

## 2019-03-07 MED ORDER — FLUCONAZOLE 200 MG PO TABS: 200.0000 mg | ORAL_TABLET | Freq: Once | ORAL | 0 refills | Status: AC

## 2019-03-07 NOTE — ED Triage Notes (Signed)
Pt c/o vaginal discharge, was seen here on 11/19 with negative swab. C/o ongoing symptoms. Itching as well.

## 2019-03-07 NOTE — Discharge Instructions (Signed)
We will treat you for concern for yeast infection, take 1 pill today.  Will notify of any positive findings and if any changes to treatment are needed.   Please return or follow up with gynecology for any persistent symptoms.

## 2019-03-07 NOTE — ED Provider Notes (Signed)
[facility]    CSN: [id number] Arrival date & time: 03/07/19  1118      History   Chief Complaint Chief Complaint  Patient presents with  . Vaginal Discharge    HPI [name] is a 38 y.o. female.   [name] presents with complaints of vaginal discharge with itching. Was seen here for vaginal discharge 11/19. Vaginal swab was negative for any findings at that time. She had been provided with empiric flagyl at time of visit, she did not take this, however. No pelvic pain. States the vaginal discharge no longer has an odor and is thicker than it had been, more itching. No bleeding. LMP 11/12. No urinary symptoms. History of bipolar, chlamydia, substance abuse, htn, seizures, trichomonas.    ROS per HPI, negative if not otherwise mentioned.      Past Medical History:  Diagnosis Date  . Anemia   . Bipolar 1 disorder ([place])   . Chlamydia   . Chronic abdominal pain   . Chronic nausea   . Deliberate self-cutting   . History of substance abuse ([place])   . HSV-2 infection 2015  . Hypertension   . Infection    MRSA in 1995, negative since  . Schizophrenia ([place])   . Seizures ([place])    Not recently  . Sickle cell trait ([place])   . Trichomonas infection     Patient Active Problem List   Diagnosis Date Noted  . Abdominal pain 12/02/2017  . Back pain affecting pregnancy in third trimester 03/20/2017  . Labor, premature with delivery 03/20/2017  . Preterm labor in third trimester 03/03/2017  . Chronic hypertension in pregnancy 02/21/2017  . GERD (gastroesophageal reflux disease) 02/21/2017  . Umbilical hernia 46/65/9935  . Hereditary disease in family possibly affecting fetus, fetus 99   . Penicillin allergy 10/02/2016  . Opiate use 09/25/2016  . Supervision of high risk pregnancy, antepartum 09/19/2016  . History of herpes genitalis 09/19/2016  . History of trichomoniasis 09/19/2016  . Advanced maternal age in multigravida, second trimester  09/19/2016  . [place] multiparity 09/19/2016  . Short interval between pregnancies affecting pregnancy in first trimester, antepartum 09/19/2016  . Dichorionic diamniotic twin gestation 09/19/2016  . Chronic hypertension 07/03/2016  . Tobacco abuse 12/19/2014  . Bipolar disorder ([place]) 12/19/2014  . History of substance abuse ([place]) 12/19/2014  . Sickle cell trait ([place]) 12/19/2014    Past Surgical History:  Procedure Laterality Date  . CESAREAN SECTION N/A 03/21/2017   Procedure: CESAREAN SECTION;  Surgeon: Aletha Halim, MD;  Location: [facility];  Service: Obstetrics;  Laterality: N/A;  . DILATION AND CURETTAGE OF UTERUS    . HEMORROIDECTOMY  2010  . plastic surgery on face    . UMBILICAL HERNIA REPAIR N/A 12/02/2017   Procedure: LAPAROSCOPIC EXCISION OF MESH ERAS PATHWAY;  Surgeon: Kinsinger, Arta Bruce, MD;  Location: [facility];  Service: General;  Laterality: N/A;    OB History    Gravida  15   Para  9   Term  8   Preterm  1   AB  6   Living  10     SAB  6   TAB  0   Ectopic  0   Multiple  1   Live Births  10            Home Medications    Prior to Admission medications   Medication Sig Start Date End Date Taking? Authorizing Provider  escitalopram (LEXAPRO) 10  MG tablet TK 1 T PO D 08/22/18  Yes [provider]  SUBOXONE 8-2 MG FILM PLACE 1 FILM UNDER THE TONGUE TID 10/16/18  Yes [provider]  fluconazole (DIFLUCAN) 200 MG tablet Take 1 tablet (200 mg total) by mouth once for 1 dose. 03/07/19 03/07/19  Zigmund Gottron, NP  megestrol (MEGACE) 20 MG tablet Take 20 mg by mouth 3 (three) times daily. 01/13/19   [provider]  metroNIDAZOLE (FLAGYL) 500 MG tablet Take 1 tablet (500 mg total) by mouth 2 (two) times daily. 02/25/19   Chase Picket, MD  NARCAN 4 MG/0.1ML LIQD nasal spray kit INSTILL 1 SPRAY IN NOSTRIL PRN 09/10/18   [provider]  predniSONE (DELTASONE) 10 MG tablet Take 10 mg by mouth daily.  10/15/18   [provider]    Family History Family History  Problem Relation Age of Onset  . Cancer Mother   . Heart failure Mother   . Hypertension Mother   . Stroke Mother   . Diabetes Mother   . Healthy Father   . Hypertension Maternal Grandmother   . Anesthesia problems Neg Hx     Social History Social History   Tobacco Use  . Smoking status: Former Smoker    Packs/day: 0.50    Years: 1.50    Pack years: 0.75    Types: Cigarettes    Quit date: 04/09/2015    Years since quitting: 3.9  . Smokeless tobacco: Never Used  Substance Use Topics  . Alcohol use: No    Comment: hx drug use  . Drug use: No    Types: Marijuana, Cocaine    Comment: past use, 4 years ago     Allergies   Peanut-containing drug products, Amoxicillin, and Latex   Review of Systems Review of Systems   Physical Exam Triage Vital Signs ED Triage Vitals  Enc Vitals Group     BP 03/07/19 1238 128/82     Pulse Rate 03/07/19 1238 61     Resp 03/07/19 1238 16     Temp 03/07/19 1238 98.7 F (37.1 C)     Temp Source 03/07/19 1238 Oral     SpO2 03/07/19 1238 96 %     Weight --      Height --      Head Circumference --      Peak Flow --      Pain Score 03/07/19 1231 0     Pain Loc --      Pain Edu? --      Excl. in [place]? --    No data found.  Updated Vital Signs BP 128/82 (BP Location: Left Arm)   Pulse 61   Temp 98.7 F (37.1 C) (Oral)   Resp 16   LMP 02/18/2019 (Exact Date)   SpO2 96%    Physical Exam Constitutional:      General: She is not in acute distress.    Appearance: She is well-developed.  Cardiovascular:     Rate and Rhythm: Normal rate.  Pulmonary:     Effort: Pulmonary effort is normal.  Abdominal:     Palpations: Abdomen is soft. Abdomen is not rigid.     Tenderness: There is no abdominal tenderness. There is no guarding or rebound.  Genitourinary:    Comments: Denies sores, lesions, vaginal bleeding; no pelvic pain; gu exam deferred at this time,  vaginal self swab collected.   Skin:    General: Skin is warm and dry.  Neurological:     Mental Status: She is alert and oriented to person, place, and time.      UC Treatments / Results  Labs (all labs ordered are listed, but only abnormal results are displayed) Labs Reviewed  CERVICOVAGINAL ANCILLARY ONLY    EKG   Radiology No results found.  Procedures Procedures (including critical care time)  Medications Ordered in UC Medications - No data to display  Initial Impression / Assessment and Plan / UC Course  I have reviewed the triage vital signs and the nursing notes.  Pertinent labs & imaging results that were available during my care of the patient were reviewed by me and considered in my medical decision making (see chart for details).     Previous vaginal cytology without acute findings. Will repeat. Vaginal discharge with itching, no further odor. 1 tab diflucan provided while we await results. If symptoms worsen or do not improve in the next week to return to be seen or to follow up with PCP.  Patient verbalized understanding and agreeable to plan.   Final Clinical Impressions(s) / UC Diagnoses   Final diagnoses:  Vaginal itching     Discharge Instructions     We will treat you for concern for yeast infection, take 1 pill today.  Will notify of any positive findings and if any changes to treatment are needed.   Please return or follow up with gynecology for any persistent symptoms.     ED Prescriptions    Medication Sig Dispense Auth. Provider   fluconazole (DIFLUCAN) 200 MG tablet Take 1 tablet (200 mg total) by mouth once for 1 dose. 1 tablet Zigmund Gottron, NP     PDMP not reviewed this encounter.   Zigmund Gottron, NP 03/07/19 1333

## 2019-03-09 LAB — CERVICOVAGINAL ANCILLARY ONLY
Bacterial vaginitis: NEGATIVE
Candida vaginitis: POSITIVE — AB
Chlamydia: NEGATIVE
Neisseria Gonorrhea: NEGATIVE
Trichomonas: NEGATIVE

## 2019-03-31 ENCOUNTER — Other Ambulatory Visit: Payer: Self-pay

## 2019-03-31 ENCOUNTER — Encounter (HOSPITAL_COMMUNITY): Payer: Self-pay

## 2019-03-31 ENCOUNTER — Ambulatory Visit (INDEPENDENT_AMBULATORY_CARE_PROVIDER_SITE_OTHER): Payer: Medicaid Other

## 2019-03-31 ENCOUNTER — Ambulatory Visit (HOSPITAL_COMMUNITY)
Admission: EM | Admit: 2019-03-31 | Discharge: 2019-03-31 | Disposition: A | Discharge status: Home / Self Care | Payer: Medicaid Other | Attending: Family Medicine | Admitting: Family Medicine

## 2019-03-31 DIAGNOSIS — H9202 Otalgia, left ear: Secondary | ICD-10-CM | POA: Diagnosis present

## 2019-03-31 DIAGNOSIS — N898 Other specified noninflammatory disorders of vagina: Secondary | ICD-10-CM | POA: Diagnosis present

## 2019-03-31 DIAGNOSIS — Z3202 Encounter for pregnancy test, result negative: Secondary | ICD-10-CM | POA: Diagnosis not present

## 2019-03-31 DIAGNOSIS — Z711 Person with feared health complaint in whom no diagnosis is made: Secondary | ICD-10-CM | POA: Diagnosis present

## 2019-03-31 DIAGNOSIS — M79671 Pain in right foot: Secondary | ICD-10-CM | POA: Diagnosis present

## 2019-03-31 LAB — POC URINE PREG, ED: Preg Test, Ur: NEGATIVE

## 2019-03-31 LAB — POCT PREGNANCY, URINE: Preg Test, Ur: NEGATIVE

## 2019-03-31 MED ORDER — CEFTRIAXONE SODIUM 250 MG IJ SOLR
INTRAMUSCULAR | Status: AC
  Filled 2019-03-31: qty 250

## 2019-03-31 MED ORDER — CEFTRIAXONE SODIUM 250 MG IJ SOLR
250.0000 mg | Freq: Once | INTRAMUSCULAR | Status: AC
  Administered 2019-03-31: 15:00:00 250 mg via INTRAMUSCULAR

## 2019-03-31 MED ORDER — AZITHROMYCIN 250 MG PO TABS
1000.0000 mg | ORAL_TABLET | Freq: Once | ORAL | Status: AC
  Administered 2019-03-31: 15:00:00 1000 mg via ORAL

## 2019-03-31 MED ORDER — IBUPROFEN 800 MG PO TABS: 800.0000 mg | ORAL_TABLET | Freq: Three times a day (TID) | ORAL | 0 refills | Status: DC

## 2019-03-31 MED ORDER — AZITHROMYCIN 250 MG PO TABS
ORAL_TABLET | ORAL | Status: AC
  Filled 2019-03-31: qty 4

## 2019-03-31 MED ORDER — FLUCONAZOLE 150 MG PO TABS: ORAL_TABLET | ORAL | 0 refills | Status: DC

## 2019-03-31 MED ORDER — METRONIDAZOLE 500 MG PO TABS: 500.0000 mg | ORAL_TABLET | Freq: Two times a day (BID) | ORAL | 0 refills | Status: DC

## 2019-03-31 NOTE — ED Triage Notes (Addendum)
Patient presents to Urgent Care with complaints of vaginal discharge since two days ago. Patient reports she thinks her husband gave her an STD.  Patient would also like her right foot assessed from when she fell down the stairs yesterday and still has pain, pt also mentions she wants her left ear looked at.

## 2019-03-31 NOTE — ED Provider Notes (Signed)
[facility]   [id number] 03/31/19 Arrival Time: 1194  ASSESSMENT & PLAN:  1. Vaginal discharge   2. Concern about STD in female without diagnosis   3. Otalgia of left ear   4. Foot pain, right     I have personally viewed the imaging studies ordered this visit. No foot fracture appreciated. WBAT. No CAM walker needed.  No sign of ear infection. Monitor symptoms.  "Just treat me for everything" regarding concerns over STDs. No s/s of PID.   Meds ordered this encounter  Medications  . ibuprofen (ADVIL) 800 MG tablet    Sig: Take 1 tablet (800 mg total) by mouth 3 (three) times daily with meals.    Dispense:  21 tablet    Refill:  0  . metroNIDAZOLE (FLAGYL) 500 MG tablet    Sig: Take 1 tablet (500 mg total) by mouth 2 (two) times daily.    Dispense:  14 tablet    Refill:  0  . fluconazole (DIFLUCAN) 150 MG tablet    Sig: Take one tablet by mouth as a single dose. May repeat in 3 days if symptoms persist.    Dispense:  2 tablet    Refill:  0   UPT negative.   Discharge Instructions     You have been given the following medications today for treatment of suspected gonorrhea and/or chlamydia:  cefTRIAXone (ROCEPHIN) injection 250 mg azithromycin (ZITHROMAX) tablet 1,000 mg  Even though we have treated you today, we have sent testing for sexually transmitted infections. We will notify you of any positive results once they are received. If required, we will prescribe any medications you might need.  Please refrain from all sexual activity for at least the next seven days.     Pending: CERVICOVAGINAL ANCILLARY ONLY   Will notify of any positive results. Instructed to refrain from sexual activity for at least seven days.  Reviewed expectations re: course of current medical issues. Questions answered. Outlined signs and symptoms indicating need for more acute intervention. Patient verbalized understanding. After Visit Summary  given.   SUBJECTIVE:  [name] is a 38 y.o. female who presents with complaint of vaginal discharge. Onset abrupt. First noticed approx 2 d ago. Describes discharge as thick and clear and white; without odor. No specific aggravating or alleviating factors reported. Denies: urinary frequency, dysuria and gross hematuria. Afebrile. No abdominal or pelvic pain. Normal PO intake wihout n/v. No genital rashes or lesions. Reports that she is sexually active with single female partner without regular condom use. OTC treatment: none. History of STI: treated gonorrhea; 02/2019  Patient's last menstrual period was 03/04/2019.  Requests pregnancy test. "Just want to be sure I'm not pregnant".  Also reports R foot pain. Reports fall on stairs yesterday. Noted foot pain shortly after. Ambulatory immediately after fall and since. More pain with weight bearing. No extremity sensation changes or weakness. No OTC treatment.  Also would like her L ear evaluated. Reports 1-2 episodes of sharp ear pain for the past few days. No pain now. No ear drainage. No hearing changes. No recent illnesses reported.  ROS: As per HPI. All other systems negative.   OBJECTIVE:  Vitals:   03/31/19 1410  BP: (!) 130/93  Pulse: 68  Resp: 16  Temp: 98.4 F (36.9 C)  TempSrc: Oral  SpO2: 99%     General appearance: alert, cooperative, appears stated age and no distress L ear: normal EAC and TM; no drainage or bleeding Throat: lips, mucosa,  and tongue normal; teeth and gums normal CV: RRR Lungs: CTAB Back: no CVA tenderness; FROM at waist Abdomen: soft, non-tender GU: deferred Ext: lateral R foot with poorly localized TTP; no swelling or bruising; normal distal sensation; normal DP/PT pulses Skin: warm and dry Psychological: alert and cooperative; normal mood and affect.  Results for orders placed or performed during the hospital encounter of 03/31/19  Pregnancy, urine POC  Result Value Ref Range   Preg  Test, Ur NEGATIVE NEGATIVE  POC urine preg, ED (not at [facility])  Result Value Ref Range   Preg Test, Ur NEGATIVE NEGATIVE      Allergies  Allergen Reactions  . Peanut-Containing Drug Products Anaphylaxis, Itching and Rash  . Amoxicillin Hives, Swelling and Other (See Comments)    Has patient had a PCN reaction causing immediate rash, facial/tongue/throat swelling, SOB or lightheadedness with hypotension: No Has patient had a PCN reaction causing severe rash involving mucus membranes or skin necrosis: No Has patient had a PCN reaction that required hospitalization No Has patient had a PCN reaction occurring within the last 10 years: Yes If all of the above answers are "NO", then may proceed with Cephalosporin use.  . Latex Hives and Itching    Past Medical History:  Diagnosis Date  . Anemia   . Bipolar 1 disorder ([facility])   . Chlamydia   . Chronic abdominal pain   . Chronic nausea   . Deliberate self-cutting   . History of substance abuse ([facility])   . HSV-2 infection 2015  . Hypertension   . Infection    MRSA in 1995, negative since  . Schizophrenia ([facility])   . Seizures ([facility])    Not recently  . Sickle cell trait ([facility])   . Trichomonas infection    Family History  Problem Relation Age of Onset  . Cancer Mother   . Heart failure Mother   . Hypertension Mother   . Stroke Mother   . Diabetes Mother   . Healthy Father   . Hypertension Maternal Grandmother   . Anesthesia problems Neg Hx    Social History   Socioeconomic History  . Marital status: Legally Separated    Spouse name: Not on file  . Number of children: Not on file  . Years of education: Not on file  . Highest education level: Not on file  Occupational History  . Not on file  Tobacco Use  . Smoking status: Former Smoker    Packs/day: 0.50    Years: 1.50    Pack years: 0.75    Types: Cigarettes    Quit date: 04/09/2015    Years since quitting: 3.9  . Smokeless tobacco: Never Used  Substance and Sexual Activity   . Alcohol use: No    Comment: hx drug use  . Drug use: No    Types: Marijuana, Cocaine    Comment: past use, 4 years ago, edibles 03/2019  . Sexual activity: Not Currently    Birth control/protection: None    Comment: desires Depo Provera  Other Topics Concern  . Not on file  Social History Narrative  . Not on file   Social Determinants of Health   Financial Resource Strain:   . Difficulty of Paying Living Expenses: Not on file  Food Insecurity:   . Worried About Programme researcher, broadcasting/film/video in the Last Year: Not on file  . Ran Out of Food in the Last Year: Not on file  Transportation Needs:   . Lack of Transportation (  Medical): Not on file  . Lack of Transportation (Non-Medical): Not on file  Physical Activity:   . Days of Exercise per Week: Not on file  . Minutes of Exercise per Session: Not on file  Stress:   . Feeling of Stress : Not on file  Social Connections:   . Frequency of Communication with Friends and Family: Not on file  . Frequency of Social Gatherings with Friends and Family: Not on file  . Attends Religious Services: Not on file  . Active Member of Clubs or Organizations: Not on file  . Attends BankerClub or Organization Meetings: Not on file  . Marital Status: Not on file  Intimate Partner Violence:   . Fear of Current or Ex-Partner: Not on file  . Emotionally Abused: Not on file  . Physically Abused: Not on file  . Sexually Abused: Not on file          Mardella LaymanHagler, Krystan Northrop, MD 04/01/19 [phone]

## 2019-03-31 NOTE — Discharge Instructions (Signed)

## 2019-04-06 LAB — CERVICOVAGINAL ANCILLARY ONLY
Bacterial vaginitis: NEGATIVE
Candida vaginitis: POSITIVE — AB
Chlamydia: NEGATIVE
Neisseria Gonorrhea: NEGATIVE
Trichomonas: NEGATIVE

## 2019-06-20 ENCOUNTER — Other Ambulatory Visit: Payer: Self-pay

## 2019-06-20 ENCOUNTER — Ambulatory Visit (HOSPITAL_COMMUNITY)
Admission: EM | Admit: 2019-06-20 | Discharge: 2019-06-20 | Disposition: A | Discharge status: Home / Self Care | Payer: Medicaid Other | Attending: Urgent Care | Admitting: Urgent Care

## 2019-06-20 ENCOUNTER — Encounter (HOSPITAL_COMMUNITY): Payer: Self-pay

## 2019-06-20 DIAGNOSIS — Z7251 High risk heterosexual behavior: Secondary | ICD-10-CM | POA: Diagnosis present

## 2019-06-20 DIAGNOSIS — Z8619 Personal history of other infectious and parasitic diseases: Secondary | ICD-10-CM | POA: Diagnosis present

## 2019-06-20 DIAGNOSIS — N898 Other specified noninflammatory disorders of vagina: Secondary | ICD-10-CM | POA: Diagnosis present

## 2019-06-20 DIAGNOSIS — R102 Pelvic and perineal pain: Secondary | ICD-10-CM

## 2019-06-20 LAB — POC URINE PREG, ED: Preg Test, Ur: NEGATIVE

## 2019-06-20 LAB — POCT PREGNANCY, URINE: Preg Test, Ur: NEGATIVE

## 2019-06-20 MED ORDER — FLUCONAZOLE 150 MG PO TABS: 150.0000 mg | ORAL_TABLET | ORAL | 0 refills | Status: DC

## 2019-06-20 MED ORDER — DOXYCYCLINE HYCLATE 100 MG PO CAPS: 100.0000 mg | ORAL_CAPSULE | Freq: Two times a day (BID) | ORAL | 0 refills | Status: DC

## 2019-06-20 MED ORDER — CEFTRIAXONE SODIUM 500 MG IJ SOLR
500.0000 mg | Freq: Once | INTRAMUSCULAR | Status: AC
  Administered 2019-06-20: 500 mg via INTRAMUSCULAR

## 2019-06-20 MED ORDER — NAPROXEN 500 MG PO TABS: 500.0000 mg | ORAL_TABLET | Freq: Two times a day (BID) | ORAL | 0 refills | Status: DC

## 2019-06-20 MED ORDER — CEFTRIAXONE SODIUM 500 MG IJ SOLR
INTRAMUSCULAR | Status: AC
  Filled 2019-06-20: qty 500

## 2019-06-20 MED ORDER — METRONIDAZOLE 500 MG PO TABS: 500.0000 mg | ORAL_TABLET | Freq: Two times a day (BID) | ORAL | 0 refills | Status: DC

## 2019-06-20 MED ORDER — LIDOCAINE HCL (PF) 1 % IJ SOLN
INTRAMUSCULAR | Status: AC
  Filled 2019-06-20: qty 2

## 2019-06-20 NOTE — ED Triage Notes (Signed)
Pt present vaginal discharge with some discomfort and foul odor. She would like to be tested for all std. Symptoms started 4 days ago.

## 2019-06-20 NOTE — Discharge Instructions (Signed)
Avoid all forms of sexual intercourse (oral, vaginal, anal) for the next 7 days to avoid spreading/reinfecting. Return if symptoms worsen/do not resolve, you develop fever, abdominal pain, blood in your urine, or are re-exposed to an STI.  

## 2019-06-20 NOTE — ED Provider Notes (Signed)
[facility]   MRN: [id number] DOB: 07-18-1980  Subjective:   [name] is a 39 y.o. female presenting for 4-day history of acute onset malodorous vaginal discharge.  Patient states she is also had severe pelvic pain.  She is very concerned about having STIs, is married and previously had her husband cheated on her.  She last had gonorrhea in November, was treated with IM ceftriaxone.  She did not have any reaction.  No current facility-administered medications for this encounter.  Current Outpatient Medications:  .  escitalopram (LEXAPRO) 10 MG tablet, TK 1 T PO D, Disp: , Rfl:  .  fluconazole (DIFLUCAN) 150 MG tablet, Take one tablet by mouth as a single dose. May repeat in 3 days if symptoms persist., Disp: 2 tablet, Rfl: 0 .  ibuprofen (ADVIL) 800 MG tablet, Take 1 tablet (800 mg total) by mouth 3 (three) times daily with meals., Disp: 21 tablet, Rfl: 0 .  megestrol (MEGACE) 20 MG tablet, Take 20 mg by mouth 3 (three) times daily., Disp: , Rfl:  .  metroNIDAZOLE (FLAGYL) 500 MG tablet, Take 1 tablet (500 mg total) by mouth 2 (two) times daily., Disp: 14 tablet, Rfl: 0 .  NARCAN 4 MG/0.1ML LIQD nasal spray kit, INSTILL 1 SPRAY IN NOSTRIL PRN, Disp: , Rfl:  .  predniSONE (DELTASONE) 10 MG tablet, Take 10 mg by mouth daily., Disp: , Rfl:  .  SUBOXONE 8-2 MG FILM, PLACE 1 FILM UNDER THE TONGUE TID, Disp: , Rfl:    Allergies  Allergen Reactions  . Peanut-Containing Drug Products Anaphylaxis, Itching and Rash  . Amoxicillin Hives, Swelling and Other (See Comments)    Has patient had a PCN reaction causing immediate rash, facial/tongue/throat swelling, SOB or lightheadedness with hypotension: No Has patient had a PCN reaction causing severe rash involving mucus membranes or skin necrosis: No Has patient had a PCN reaction that required hospitalization No Has patient had a PCN reaction occurring within the last 10 years: Yes If all of the above answers are "NO", then may  proceed with Cephalosporin use.  . Latex Hives and Itching    Past Medical History:  Diagnosis Date  . Anemia   . Bipolar 1 disorder ([place])   . Chlamydia   . Chronic abdominal pain   . Chronic nausea   . Deliberate self-cutting   . History of substance abuse ([place])   . HSV-2 infection 2015  . Hypertension   . Infection    MRSA in 1995, negative since  . Schizophrenia ([place])   . Seizures ([place])    Not recently  . Sickle cell trait ([place])   . Trichomonas infection      Past Surgical History:  Procedure Laterality Date  . CESAREAN SECTION N/A 03/21/2017   Procedure: CESAREAN SECTION;  Surgeon: Aletha Halim, MD;  Location: [facility];  Service: Obstetrics;  Laterality: N/A;  . DILATION AND CURETTAGE OF UTERUS    . HEMORROIDECTOMY  2010  . plastic surgery on face    . UMBILICAL HERNIA REPAIR N/A 12/02/2017   Procedure: LAPAROSCOPIC EXCISION OF MESH ERAS PATHWAY;  Surgeon: Kinsinger, Arta Bruce, MD;  Location: [facility];  Service: General;  Laterality: N/A;    Family History  Problem Relation Age of Onset  . Cancer Mother   . Heart failure Mother   . Hypertension Mother   . Stroke Mother   . Diabetes Mother   . Healthy Father   . Hypertension Maternal Grandmother   . Anesthesia  problems Neg Hx     Social History   Tobacco Use  . Smoking status: Former Smoker    Packs/day: 0.50    Years: 1.50    Pack years: 0.75    Types: Cigarettes    Quit date: 04/09/2015    Years since quitting: 4.2  . Smokeless tobacco: Never Used  Substance Use Topics  . Alcohol use: No    Comment: hx drug use  . Drug use: No    Types: Marijuana, Cocaine    Comment: past use, 4 years ago, edibles 03/2019    ROS   Objective:   Vitals: BP (!) 138/91 (BP Location: Right Arm)   Pulse 66   Temp 99.1 F (37.3 C) (Oral)   Resp 16   LMP  (LMP Unknown)   SpO2 100%   Physical Exam Constitutional:      General: She is not in acute distress.    Appearance: Normal appearance. She  is well-developed. She is not ill-appearing, toxic-appearing or diaphoretic.  HENT:     Head: Normocephalic and atraumatic.     Nose: Nose normal.     Mouth/Throat:     Mouth: Mucous membranes are moist.     Pharynx: Oropharynx is clear.  Eyes:     General: No scleral icterus.    Extraocular Movements: Extraocular movements intact.     Pupils: Pupils are equal, round, and reactive to light.  Cardiovascular:     Rate and Rhythm: Normal rate.  Pulmonary:     Effort: Pulmonary effort is normal.  Abdominal:     General: Bowel sounds are normal. There is no distension.     Palpations: Abdomen is soft. There is no mass.     Tenderness: There is abdominal tenderness (Pelvic area). There is no right CVA tenderness, left CVA tenderness, guarding or rebound.  Genitourinary:    Comments: Patient declined pelvic exam. Skin:    General: Skin is warm and dry.  Neurological:     General: No focal deficit present.     Mental Status: She is alert and oriented to person, place, and time.  Psychiatric:        Mood and Affect: Mood normal.        Behavior: Behavior normal.        Thought Content: Thought content normal.        Judgment: Judgment normal.     Results for orders placed or performed during the hospital encounter of 06/20/19 (from the past 24 hour(s))  POC urine pregnancy     Status: None   Collection Time: 06/20/19  7:20 PM  Result Value Ref Range   Preg Test, Ur NEGATIVE NEGATIVE  Pregnancy, urine POC     Status: None   Collection Time: 06/20/19  7:20 PM  Result Value Ref Range   Preg Test, Ur NEGATIVE NEGATIVE    Assessment and Plan :   1. Pelvic pain   2. Vaginal discharge   3. History of gonorrhea   4. Unprotected sex     Patient insisted on getting treatment for PID.  I counseled on the regimen for this and he wanted to proceed with it.  She declined a pelvic exam and therefore I could not evaluate for cervical motion tenderness.  We will have her use Diflucan to  cover for yeast vaginitis which has been consistently positive in the past 3 months.  Start doxycycline Flagyl as a outpatient.  IM ceftriaxone in clinic. Counseled on safe sex practices  including abstaining for 1 week following treatment.  Counseled patient on potential for adverse effects with medications prescribed/recommended today, ER and return-to-clinic precautions discussed, patient verbalized understanding.    [name], [place] 06/23/19 [phone]

## 2019-06-23 LAB — CERVICOVAGINAL ANCILLARY ONLY
Bacterial vaginitis: NEGATIVE
Candida vaginitis: NEGATIVE
Chlamydia: NEGATIVE
Neisseria Gonorrhea: NEGATIVE
Trichomonas: NEGATIVE

## 2019-07-20 ENCOUNTER — Other Ambulatory Visit: Payer: Self-pay

## 2019-07-20 ENCOUNTER — Ambulatory Visit (HOSPITAL_COMMUNITY)
Admission: EM | Admit: 2019-07-20 | Discharge: 2019-07-20 | Disposition: A | Discharge status: Home / Self Care | Payer: Medicaid Other | Attending: Family Medicine | Admitting: Family Medicine

## 2019-07-20 ENCOUNTER — Encounter (HOSPITAL_COMMUNITY): Payer: Self-pay

## 2019-07-20 DIAGNOSIS — Z3202 Encounter for pregnancy test, result negative: Secondary | ICD-10-CM | POA: Diagnosis not present

## 2019-07-20 DIAGNOSIS — N898 Other specified noninflammatory disorders of vagina: Secondary | ICD-10-CM | POA: Diagnosis not present

## 2019-07-20 LAB — POCT URINALYSIS DIP (DEVICE)
Bilirubin Urine: NEGATIVE
Glucose, UA: NEGATIVE mg/dL
Hgb urine dipstick: NEGATIVE
Leukocytes,Ua: NEGATIVE
Nitrite: NEGATIVE
Protein, ur: NEGATIVE mg/dL
Specific Gravity, Urine: >=1.03 (ref 1.005–1.030)
Urobilinogen, UA: 1 mg/dL (ref 0.0–1.0)
pH: 5.5 (ref 5.0–8.0)

## 2019-07-20 LAB — POCT PREGNANCY, URINE: Preg Test, Ur: NEGATIVE

## 2019-07-20 LAB — POC URINE PREG, ED: Preg Test, Ur: NEGATIVE

## 2019-07-20 MED ORDER — TRIAMCINOLONE ACETONIDE 0.1 % EX CREA: 1.0000 "application " | TOPICAL_CREAM | Freq: Two times a day (BID) | CUTANEOUS | 0 refills | Status: DC

## 2019-07-20 MED ORDER — FLUCONAZOLE 150 MG PO TABS: 150.0000 mg | ORAL_TABLET | Freq: Once | ORAL | 0 refills | Status: AC

## 2019-07-20 MED ORDER — METRONIDAZOLE 500 MG PO TABS: 500.0000 mg | ORAL_TABLET | Freq: Two times a day (BID) | ORAL | 0 refills | Status: AC

## 2019-07-20 NOTE — Discharge Instructions (Signed)
Take 1 tab of Diflucan today to treat for yeast, may repeat after completing course of Flagyl.  Begin metronidazole twice daily for 1 week, take with food, avoid alcohol while taking.  We are testing you for Gonorrhea, Chlamydia, Trichomonas, Yeast and Bacterial Vaginosis. We will call you if anything is positive and let you know if you require any further treatment. Please inform partners of any positive results.   Please return if symptoms not improving with treatment, development of fever, nausea, vomiting, abdominal pain.

## 2019-07-20 NOTE — ED Triage Notes (Signed)
Pt states she has needs STD testing. Pt state she has had some vaginal discharge x 3 days.

## 2019-07-21 LAB — CERVICOVAGINAL ANCILLARY ONLY
Bacterial Vaginitis (gardnerella): NEGATIVE
Candida Glabrata: NEGATIVE
Candida Vaginitis: NEGATIVE
Chlamydia: NEGATIVE
Comment: NEGATIVE
Comment: NEGATIVE
Comment: NEGATIVE
Comment: NEGATIVE
Comment: NEGATIVE
Comment: NORMAL
Neisseria Gonorrhea: NEGATIVE
Trichomonas: NEGATIVE

## 2019-07-21 NOTE — ED Provider Notes (Signed)
[facility]    CSN: [id number] Arrival date & time: 07/20/19  1420      History   Chief Complaint Chief Complaint  Patient presents with  . SEXUALLY TRANSMITTED DISEASE    HPI [name] is a 39 y.o. female presenting today for evaluation of vaginal discharge.  Patient notes that over the past 3 days she has had a thick white vaginal discharge.  She would like to be screened for STDs that she recently found out her husband has been sleeping with another partner.  She has history of frequent yeast/BV and does feel symptoms may be related to this as she has had some slight itching and discomfort vaginally.  Does report some slight dysuria.  Last menstrual cycle was early March, typically has irregular cycles.  HPI  Past Medical History:  Diagnosis Date  . Anemia   . Bipolar 1 disorder ([place])   . Chlamydia   . Chronic abdominal pain   . Chronic nausea   . Deliberate self-cutting   . History of substance abuse ([place])   . HSV-2 infection 2015  . Hypertension   . Infection    MRSA in 1995, negative since  . Schizophrenia ([place])   . Seizures ([place])    Not recently  . Sickle cell trait ([place])   . Trichomonas infection     Patient Active Problem List   Diagnosis Date Noted  . Abdominal pain 12/02/2017  . Back pain affecting pregnancy in third trimester 03/20/2017  . Labor, premature with delivery 03/20/2017  . Preterm labor in third trimester 03/03/2017  . Chronic hypertension in pregnancy 02/21/2017  . GERD (gastroesophageal reflux disease) 02/21/2017  . Umbilical hernia 56/38/9373  . Hereditary disease in family possibly affecting fetus, fetus 38   . Penicillin allergy 10/02/2016  . Opiate use 09/25/2016  . Supervision of high risk pregnancy, antepartum 09/19/2016  . History of herpes genitalis 09/19/2016  . History of trichomoniasis 09/19/2016  . Advanced maternal age in multigravida, second trimester 09/19/2016  . [place] multiparity 09/19/2016  . Short  interval between pregnancies affecting pregnancy in first trimester, antepartum 09/19/2016  . Dichorionic diamniotic twin gestation 09/19/2016  . Chronic hypertension 07/03/2016  . Tobacco abuse 12/19/2014  . Bipolar disorder ([place]) 12/19/2014  . History of substance abuse ([place]) 12/19/2014  . Sickle cell trait ([place]) 12/19/2014    Past Surgical History:  Procedure Laterality Date  . CESAREAN SECTION N/A 03/21/2017   Procedure: CESAREAN SECTION;  Surgeon: Aletha Halim, MD;  Location: [facility];  Service: Obstetrics;  Laterality: N/A;  . DILATION AND CURETTAGE OF UTERUS    . HEMORROIDECTOMY  2010  . plastic surgery on face    . UMBILICAL HERNIA REPAIR N/A 12/02/2017   Procedure: LAPAROSCOPIC EXCISION OF MESH ERAS PATHWAY;  Surgeon: Kinsinger, Arta Bruce, MD;  Location: [facility];  Service: General;  Laterality: N/A;    OB History    Gravida  15   Para  9   Term  8   Preterm  1   AB  6   Living  10     SAB  6   TAB  0   Ectopic  0   Multiple  1   Live Births  10            Home Medications    Prior to Admission medications   Medication Sig Start Date End Date Taking? Authorizing Provider  escitalopram (LEXAPRO) 10 MG tablet TK 1 T PO D  08/22/18   [provider]  ibuprofen (ADVIL) 800 MG tablet Take 1 tablet (800 mg total) by mouth 3 (three) times daily with meals. 03/31/19   Vanessa Kick, MD  megestrol (MEGACE) 20 MG tablet Take 20 mg by mouth 3 (three) times daily. 01/13/19   [provider]  metroNIDAZOLE (FLAGYL) 500 MG tablet Take 1 tablet (500 mg total) by mouth 2 (two) times daily for 7 days. 07/20/19 07/27/19  Briggitte Boline C, PA-C  naproxen (NAPROSYN) 500 MG tablet Take 1 tablet (500 mg total) by mouth 2 (two) times daily. 06/20/19   Jaynee Eagles, PA-C  NARCAN 4 MG/0.1ML LIQD nasal spray kit INSTILL 1 SPRAY IN NOSTRIL PRN 09/10/18   [provider]  predniSONE (DELTASONE) 10 MG tablet Take 10 mg by mouth daily. 10/15/18    [provider]  SUBOXONE 8-2 MG FILM PLACE 1 FILM UNDER THE TONGUE TID 10/16/18   [provider]  triamcinolone cream (KENALOG) 0.1 % Apply 1 application topically 2 (two) times daily. 07/20/19   Jennelle Pinkstaff, Elesa Hacker, PA-C    Family History Family History  Problem Relation Age of Onset  . Cancer Mother   . Heart failure Mother   . Hypertension Mother   . Stroke Mother   . Diabetes Mother   . Healthy Father   . Hypertension Maternal Grandmother   . Anesthesia problems Neg Hx     Social History Social History   Tobacco Use  . Smoking status: Former Smoker    Packs/day: 0.50    Years: 1.50    Pack years: 0.75    Types: Cigarettes    Quit date: 04/09/2015    Years since quitting: 4.2  . Smokeless tobacco: Never Used  Substance Use Topics  . Alcohol use: No    Comment: hx drug use  . Drug use: No    Types: Marijuana, Cocaine    Comment: past use, 4 years ago, edibles 03/2019     Allergies   Peanut-containing drug products, Amoxicillin, and Latex   Review of Systems Review of Systems  Constitutional: Negative for fever.  Respiratory: Negative for shortness of breath.   Cardiovascular: Negative for chest pain.  Gastrointestinal: Negative for abdominal pain, diarrhea, nausea and vomiting.  Genitourinary: Positive for dysuria and vaginal discharge. Negative for flank pain, genital sores, hematuria, menstrual problem, vaginal bleeding and vaginal pain.  Musculoskeletal: Negative for back pain.  Skin: Negative for rash.  Neurological: Negative for dizziness, light-headedness and headaches.     Physical Exam Triage Vital Signs ED Triage Vitals  Enc Vitals Group     BP 07/20/19 1516 (!) 121/98     Pulse Rate 07/20/19 1516 74     Resp 07/20/19 1516 18     Temp 07/20/19 1516 99 F (37.2 C)     Temp Source 07/20/19 1516 Oral     SpO2 07/20/19 1516 100 %     Weight 07/20/19 1518 161 lb (73 kg)     Height --      Head Circumference --      Peak Flow --       Pain Score 07/20/19 1517 6     Pain Loc --      Pain Edu? --      Excl. in [place]? --    No data found.  Updated Vital Signs BP (!) 121/98 (BP Location: Right Arm)   Pulse 74   Temp 99 F (37.2 C) (Oral)   Resp 18  Wt 161 lb (73 kg)   LMP 06/10/2019   SpO2 100%   BMI 28.52 kg/m   Visual Acuity Right Eye Distance:   Left Eye Distance:   Bilateral Distance:    Right Eye Near:   Left Eye Near:    Bilateral Near:     Physical Exam Vitals and nursing note reviewed.  Constitutional:      Appearance: She is well-developed.     Comments: No acute distress  HENT:     Head: Normocephalic and atraumatic.     Nose: Nose normal.  Eyes:     Conjunctiva/sclera: Conjunctivae normal.  Cardiovascular:     Rate and Rhythm: Normal rate.  Pulmonary:     Effort: Pulmonary effort is normal. No respiratory distress.  Abdominal:     General: There is no distension.  Genitourinary:    Comments: Normal external female genitalia, no rashes or lesions noted, vaginal mucosa pink, cervix pink with moderate amount of white milky discharge present Musculoskeletal:        General: Normal range of motion.     Cervical back: Neck supple.  Skin:    General: Skin is warm and dry.  Neurological:     Mental Status: She is alert and oriented to person, place, and time.      UC Treatments / Results  Labs (all labs ordered are listed, but only abnormal results are displayed) Labs Reviewed  POCT URINALYSIS DIP (DEVICE) - Abnormal; Notable for the following components:      Result Value   Ketones, ur TRACE (*)    All other components within normal limits  POC URINE PREG, ED  POCT PREGNANCY, URINE  CERVICOVAGINAL ANCILLARY ONLY    EKG   Radiology No results found.  Procedures Procedures (including critical care time)  Medications Ordered in UC Medications - No data to display  Initial Impression / Assessment and Plan / UC Course  I have reviewed the triage vital signs and the  nursing notes.  Pertinent labs & imaging results that were available during my care of the patient were reviewed by me and considered in my medical decision making (see chart for details).     UA not suggestive of UTI.  Pregnancy test negative.  Vaginal swab pending to screen for causes of discharge.  Empirically treating for yeast and BV today.  Deferring empiric treatment of STDs, will call with results and provide further treatment as needed.  Recently had HIV and RPR in November 2020, will defer today.  Discussed strict return precautions. Patient verbalized understanding and is agreeable with plan.  Final Clinical Impressions(s) / UC Diagnoses   Final diagnoses:  Vaginal discharge     Discharge Instructions     Take 1 tab of Diflucan today to treat for yeast, may repeat after completing course of Flagyl.  Begin metronidazole twice daily for 1 week, take with food, avoid alcohol while taking.  We are testing you for Gonorrhea, Chlamydia, Trichomonas, Yeast and Bacterial Vaginosis. We will call you if anything is positive and let you know if you require any further treatment. Please inform partners of any positive results.   Please return if symptoms not improving with treatment, development of fever, nausea, vomiting, abdominal pain.    ED Prescriptions    Medication Sig Dispense Auth. Provider   metroNIDAZOLE (FLAGYL) 500 MG tablet Take 1 tablet (500 mg total) by mouth 2 (two) times daily for 7 days. 14 tablet ,  C, PA-C   fluconazole (DIFLUCAN)  150 MG tablet Take 1 tablet (150 mg total) by mouth once for 1 dose. 2 tablet ,  C, PA-C   triamcinolone cream (KENALOG) 0.1 % Apply 1 application topically 2 (two) times daily. 45 g , [place] C, PA-C     PDMP not reviewed this encounter.   Janith Lima, [place] 07/21/19 [phone]

## 2019-08-05 ENCOUNTER — Ambulatory Visit: Payer: Medicaid Other

## 2019-08-10 IMAGING — CT CT CHEST W/ CM
2 of 3 series · 15 of 36 positions shown, 18 images · IV contrast (APPLIED)
Comparison: Chest radiograph August 16, 2014

CLINICAL DATA: LEFT axillary pain, fever and nausea. History of
substance abuse.

EXAM:
CT CHEST WITH CONTRAST
TECHNIQUE: Multidetector CT imaging of the chest was performed during
intravenous contrast administration.
CONTRAST:  75mL OMNIPAQUE IOHEXOL 300 MG/ML  SOLN

[Series 3: thorax 2.0 i31f 2 · axial · 0.98mm/px · z∈[+1042,+1278]mm · 12 of 140 slices shown, 15 images]
[im 11/140  mediastinal]
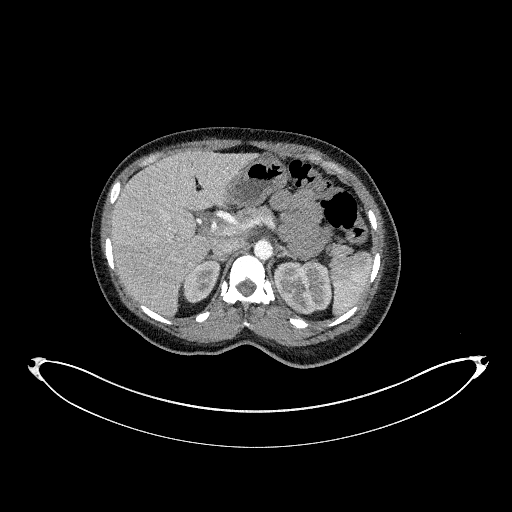
[im 11/140  lung]
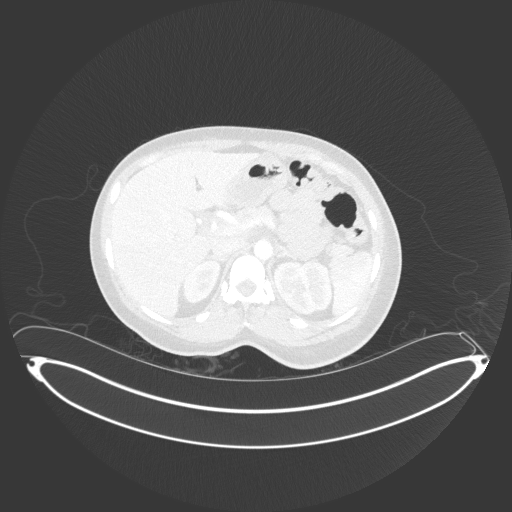
[im 21/140  lung]
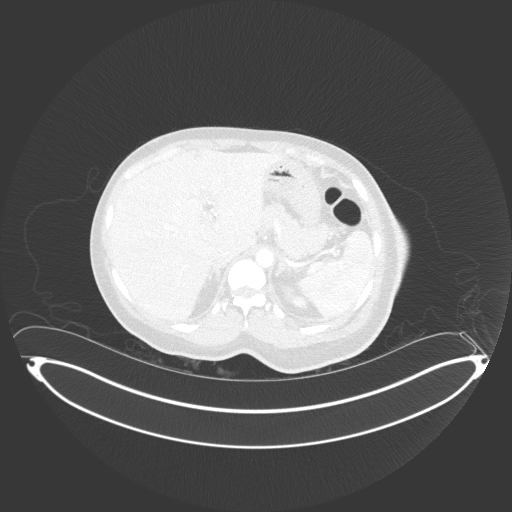
[im 31/140  lung]
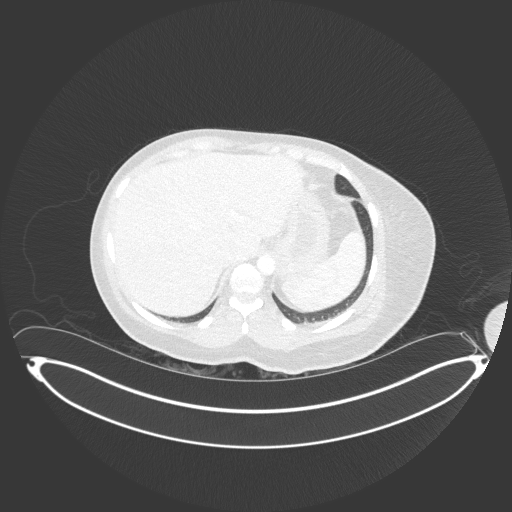
[im 42/140  lung]
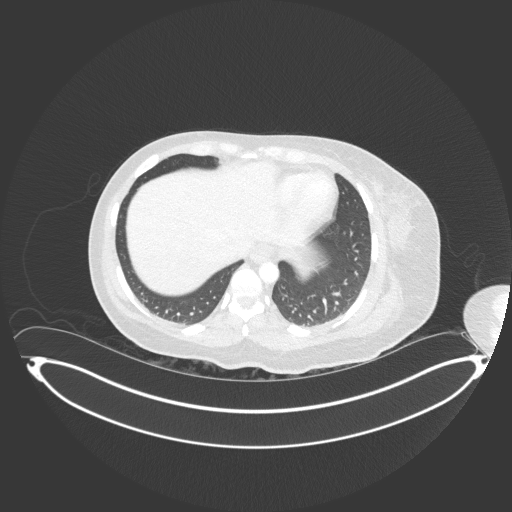
[im 52/140  mediastinal]
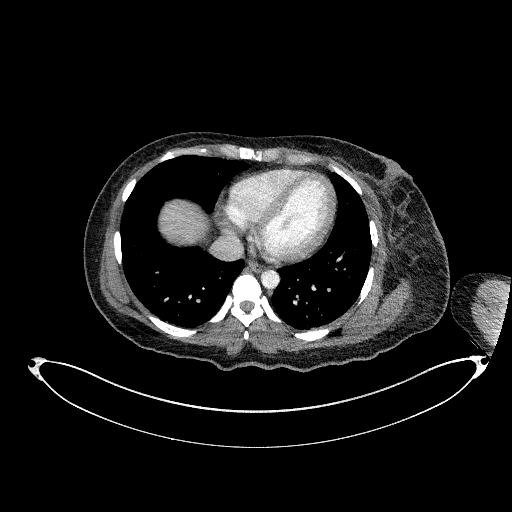
[im 52/140  lung]
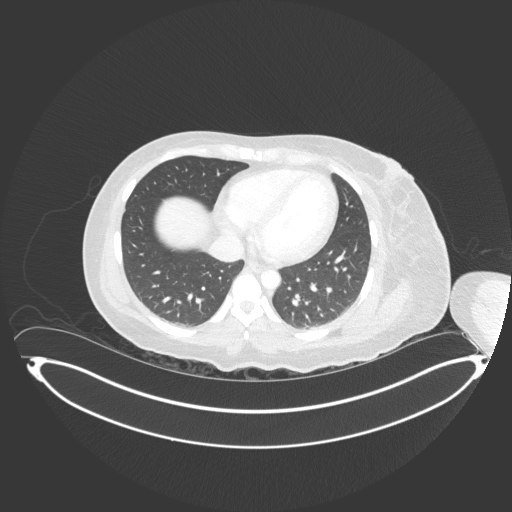
[im 62/140  lung]
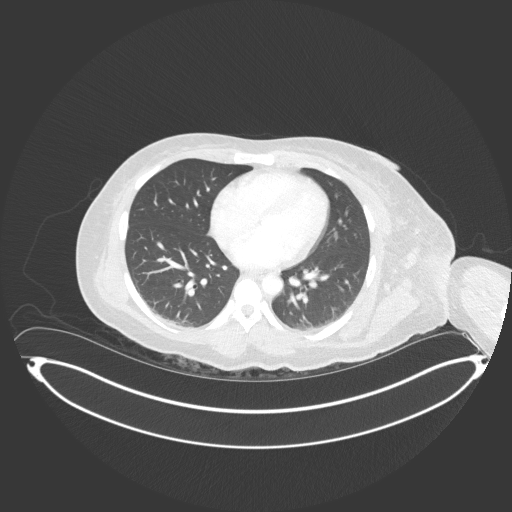
[im 78/140  lung]
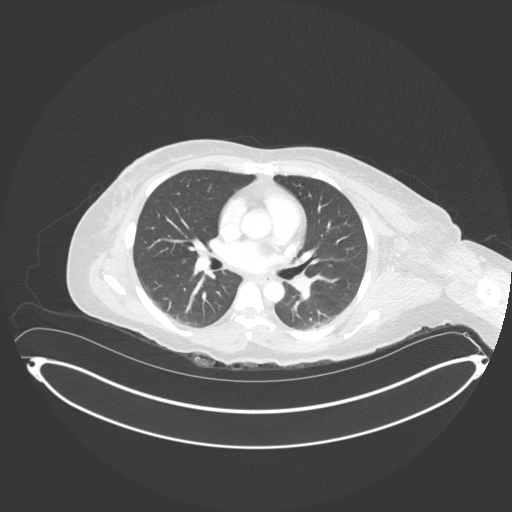
[im 88/140  lung]
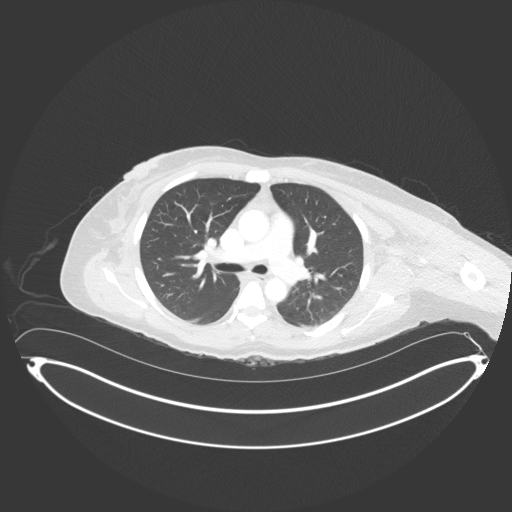
[im 98/140  mediastinal]
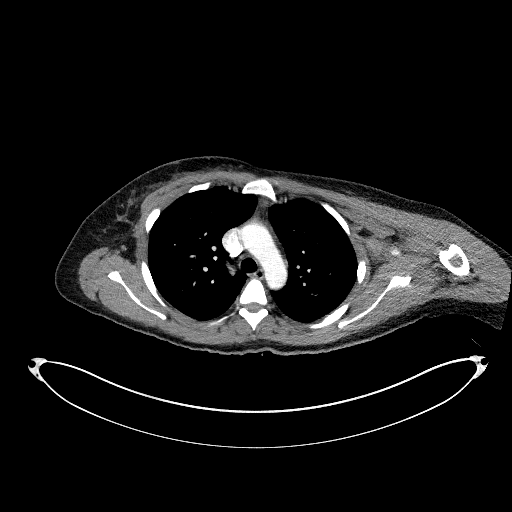
[im 98/140  lung]
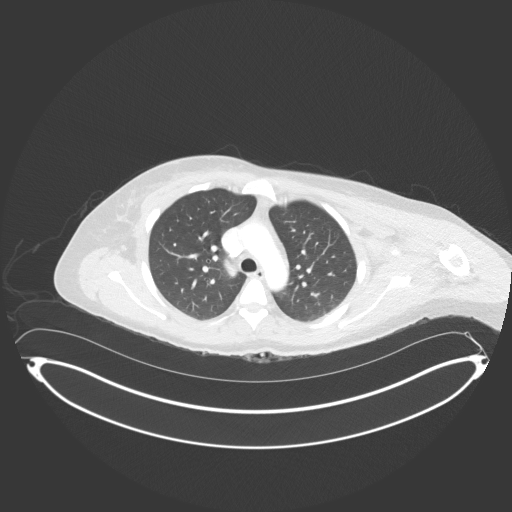
[im 109/140  lung]
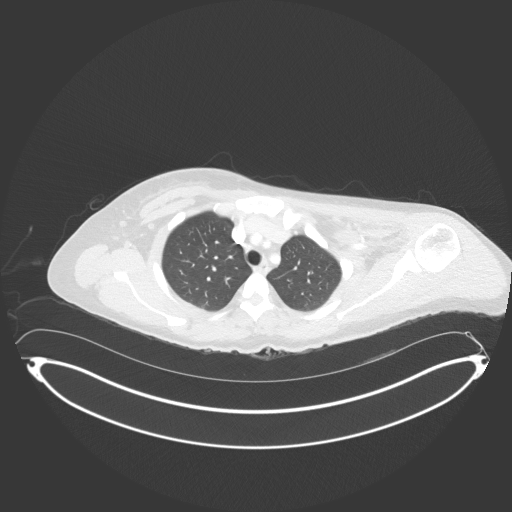
[im 119/140  lung]
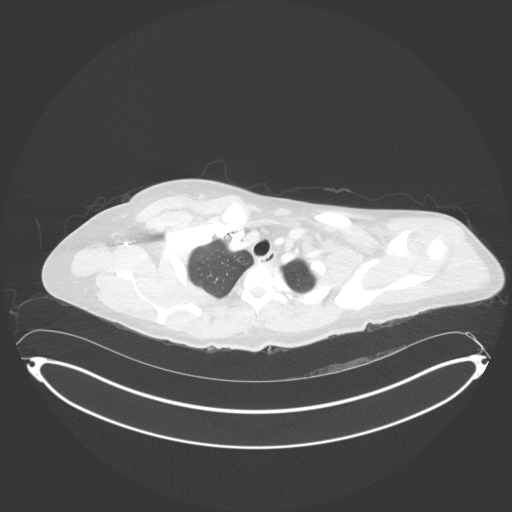
[im 129/140  lung]
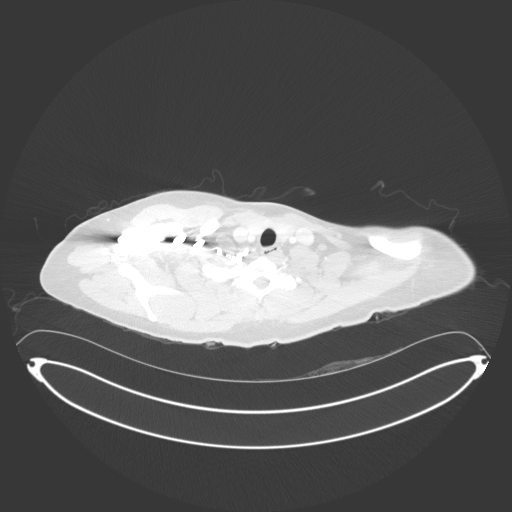

[Series 5: coronal · coronal · 0.59mm/px · 3 of 144 slices shown]
[im 29/144  lung]
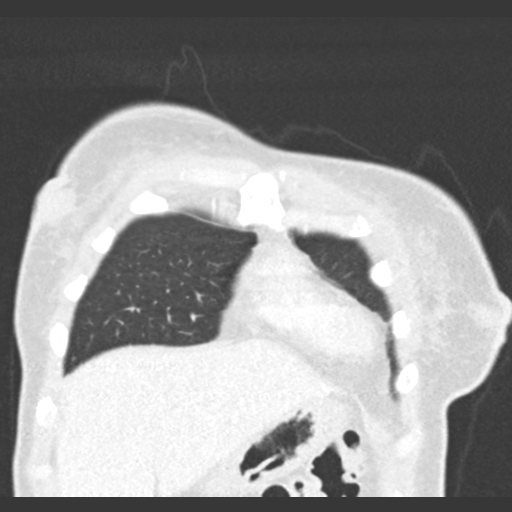
[im 58/144  lung]
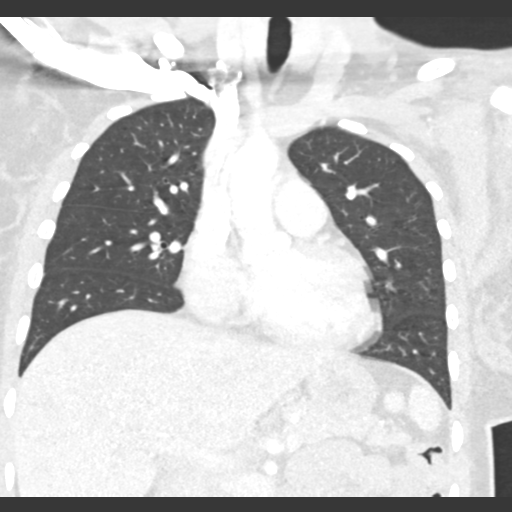
[im 86/144  lung]
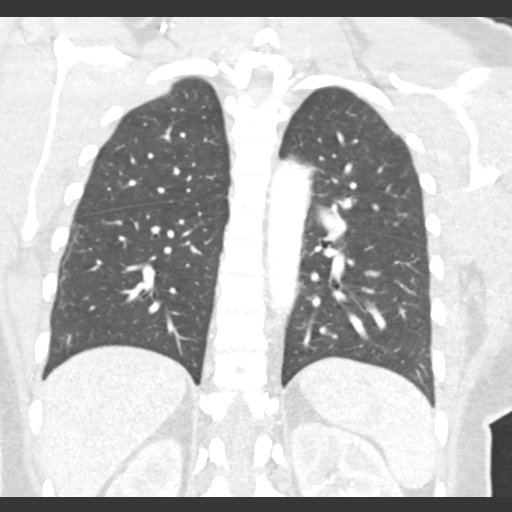

[15 of 36 positions shown; findings below may reference images not displayed]

FINDINGS: CARDIOVASCULAR: Heart and pericardium are unremarkable. Thoracic
aorta is normal course and caliber, unremarkable.

MEDIASTINUM/NODES: No mediastinal mass. No lymphadenopathy by CT
size criteria. Normal appearance of thoracic esophagus though not
tailored for evaluation.

LUNGS/PLEURA: Tracheobronchial tree is patent, no pneumothorax. No
pleural effusions, focal consolidations, pulmonary nodules or
masses.

UPPER ABDOMEN: Nonacute. 22 mm simple cyst RIGHT lobe of the liver,
11 mm cyst pole cyst segment 4.

MUSCULOSKELETAL: Focal subcentimeter nodular thickening RIGHT
hemidiaphragm. LEFT chest wall edema extending to the retro pectoral
fat, associated skin thickening without focal fluid collection,
subcutaneous gas or radiopaque foreign bodies. Preservation of the
intramuscular fat planes.
IMPRESSION: 1. LEFT axillary and chest wall cellulitis without abscess.
2. No acute cardiopulmonary process.

## 2019-09-12 ENCOUNTER — Encounter (HOSPITAL_COMMUNITY): Payer: Self-pay

## 2019-09-12 ENCOUNTER — Ambulatory Visit (HOSPITAL_COMMUNITY)
Admission: EM | Admit: 2019-09-12 | Discharge: 2019-09-12 | Disposition: A | Discharge status: Home / Self Care | Payer: Medicaid Other | Attending: Family Medicine | Admitting: Family Medicine

## 2019-09-12 ENCOUNTER — Other Ambulatory Visit: Payer: Self-pay

## 2019-09-12 DIAGNOSIS — Z202 Contact with and (suspected) exposure to infections with a predominantly sexual mode of transmission: Secondary | ICD-10-CM

## 2019-09-12 DIAGNOSIS — R350 Frequency of micturition: Secondary | ICD-10-CM | POA: Diagnosis present

## 2019-09-12 DIAGNOSIS — Z3202 Encounter for pregnancy test, result negative: Secondary | ICD-10-CM

## 2019-09-12 DIAGNOSIS — R1013 Epigastric pain: Secondary | ICD-10-CM | POA: Diagnosis not present

## 2019-09-12 LAB — POCT URINALYSIS DIP (DEVICE)
Glucose, UA: NEGATIVE mg/dL
Hgb urine dipstick: NEGATIVE
Ketones, ur: NEGATIVE mg/dL
Leukocytes,Ua: NEGATIVE
Nitrite: NEGATIVE
Protein, ur: NEGATIVE mg/dL
Specific Gravity, Urine: >=1.03 (ref 1.005–1.030)
Urobilinogen, UA: 0.2 mg/dL (ref 0.0–1.0)
pH: 5.5 (ref 5.0–8.0)

## 2019-09-12 LAB — HIV ANTIBODY (ROUTINE TESTING W REFLEX): HIV Screen 4th Generation wRfx: NONREACTIVE

## 2019-09-12 LAB — POC URINE PREG, ED: Preg Test, Ur: NEGATIVE

## 2019-09-12 MED ORDER — OMEPRAZOLE 40 MG PO CPDR: 40.0000 mg | DELAYED_RELEASE_CAPSULE | Freq: Every day | ORAL | 0 refills | Status: DC

## 2019-09-12 MED ORDER — NAPROXEN 375 MG PO TABS: ORAL_TABLET | ORAL | 0 refills | Status: DC

## 2019-09-12 NOTE — ED Provider Notes (Signed)
[facility]    CSN: [id number] Arrival date & time: 09/12/19  1503      History   Chief Complaint Chief Complaint  Patient presents with  . multiple complaints    HPI [name] is a 39 y.o. female.   HPI\ Patient patient is here with multiple complaints She states she has urinary frequency and nausea.  This has been going on since yesterday. She also has some right lower abdominal pain. She also has some epigastric pain. No cough cold or runny nose.  No chest pain, headache, or body aches.  No exposure to Covid. She does have some vaginal discharge.  She is worried about STD.  She states that she "broke up with my husband 2 months ago". She is not using consistent birth control Has not had any breast tenderness or feelings like she might be pregnant  Past Medical History:  Diagnosis Date  . Anemia   . Bipolar 1 disorder ([place])   . Chlamydia   . Chronic abdominal pain   . Chronic nausea   . Deliberate self-cutting   . History of substance abuse ([place])   . HSV-2 infection 2015  . Hypertension   . Infection    MRSA in 1995, negative since  . Schizophrenia ([place])   . Seizures ([place])    Not recently  . Sickle cell trait ([place])   . Trichomonas infection     Patient Active Problem List   Diagnosis Date Noted  . Abdominal pain 12/02/2017  . Back pain affecting pregnancy in third trimester 03/20/2017  . Labor, premature with delivery 03/20/2017  . Preterm labor in third trimester 03/03/2017  . Chronic hypertension in pregnancy 02/21/2017  . GERD (gastroesophageal reflux disease) 02/21/2017  . Umbilical hernia 17/00/1749  . Hereditary disease in family possibly affecting fetus, fetus 37   . Penicillin allergy 10/02/2016  . Opiate use 09/25/2016  . Supervision of high risk pregnancy, antepartum 09/19/2016  . History of herpes genitalis 09/19/2016  . History of trichomoniasis 09/19/2016  . Advanced maternal age in multigravida, second trimester  09/19/2016  . [place] multiparity 09/19/2016  . Short interval between pregnancies affecting pregnancy in first trimester, antepartum 09/19/2016  . Dichorionic diamniotic twin gestation 09/19/2016  . Chronic hypertension 07/03/2016  . Tobacco abuse 12/19/2014  . Bipolar disorder ([place]) 12/19/2014  . History of substance abuse ([place]) 12/19/2014  . Sickle cell trait ([place]) 12/19/2014    Past Surgical History:  Procedure Laterality Date  . CESAREAN SECTION N/A 03/21/2017   Procedure: CESAREAN SECTION;  Surgeon: Aletha Halim, MD;  Location: [facility];  Service: Obstetrics;  Laterality: N/A;  . DILATION AND CURETTAGE OF UTERUS    . HEMORROIDECTOMY  2010  . plastic surgery on face    . UMBILICAL HERNIA REPAIR N/A 12/02/2017   Procedure: LAPAROSCOPIC EXCISION OF MESH ERAS PATHWAY;  Surgeon: Kinsinger, Arta Bruce, MD;  Location: [facility];  Service: General;  Laterality: N/A;    OB History    Gravida  15   Para  9   Term  8   Preterm  1   AB  6   Living  10     SAB  6   TAB  0   Ectopic  0   Multiple  1   Live Births  10            Home Medications    Prior to Admission medications   Medication Sig Start Date End Date Taking? Authorizing  Provider  escitalopram (LEXAPRO) 10 MG tablet TK 1 T PO D 08/22/18   [provider]  naproxen (NAPROSYN) 375 MG tablet Take 1 pill 2 times a day with food 09/12/19   Raylene Everts, MD  [facility] 4 MG/0.1ML LIQD nasal spray kit INSTILL 1 SPRAY IN NOSTRIL PRN 09/10/18   [provider]  omeprazole (PRILOSEC) 40 MG capsule Take 1 capsule (40 mg total) by mouth daily. 09/12/19   Raylene Everts, MD  SUBOXONE 8-2 MG FILM PLACE 1 FILM UNDER THE TONGUE TID 10/16/18   [provider]    Family History Family History  Problem Relation Age of Onset  . Cancer Mother   . Heart failure Mother   . Hypertension Mother   . Stroke Mother   . Diabetes Mother   . Healthy Father   . Hypertension Maternal  Grandmother   . Anesthesia problems Neg Hx     Social History Social History   Tobacco Use  . Smoking status: Former Smoker    Packs/day: 0.50    Years: 1.50    Pack years: 0.75    Types: Cigarettes    Quit date: 04/09/2015    Years since quitting: 4.4  . Smokeless tobacco: Never Used  Substance Use Topics  . Alcohol use: No    Comment: hx drug use  . Drug use: No    Types: Marijuana, Cocaine    Comment: past use, 4 years ago, edibles 03/2019     Allergies   Peanut-containing drug products, Amoxicillin, and Latex   Review of Systems Review of Systems  Constitutional: Negative for chills, fatigue and fever.  HENT: Negative for congestion.   Respiratory: Negative for cough and shortness of breath.   Cardiovascular: Negative for chest pain.  Gastrointestinal: Positive for abdominal pain and nausea.  Genitourinary: Positive for frequency and pelvic pain. Negative for dysuria.  Musculoskeletal: Negative for myalgias.  Neurological: Negative for headaches.     Physical Exam Triage Vital Signs ED Triage Vitals [09/12/19 1642]  Enc Vitals Group     BP (!) 120/91     Pulse      Resp 18     Temp 99 F (37.2 C)     Temp Source Oral     SpO2      Weight 169 lb (76.7 kg)     Height 5' 3"  (1.6 m)     Head Circumference      Peak Flow      Pain Score 10     Pain Loc      Pain Edu?      Excl. in [place]?    No data found.  Updated Vital Signs BP (!) 120/91   Temp 99 F (37.2 C) (Oral)   Resp 18   Ht 5' 3"  (1.6 m)   Wt 76.7 kg   BMI 29.94 kg/m      Physical Exam Constitutional:      General: She is not in acute distress.    Appearance: Normal appearance. She is well-developed and normal weight.     Comments: Patient walks with a normal gait.  She appears in no acute distress.  HENT:     Head: Normocephalic and atraumatic.     Mouth/Throat:     Comments: Mask is in place Eyes:     Conjunctiva/sclera: Conjunctivae normal.     Pupils: Pupils are equal,  round, and reactive to light.  Cardiovascular:     Rate and Rhythm: Normal  rate and regular rhythm.     Heart sounds: Normal heart sounds.  Pulmonary:     Effort: Pulmonary effort is normal. No respiratory distress.     Breath sounds: Normal breath sounds.  Abdominal:     General: There is no distension.     Palpations: Abdomen is soft.     Tenderness: There is abdominal tenderness.     Comments: Tenderness to palpation mid epigastrium only.  No guarding or rebound.  No organomegaly.  Musculoskeletal:        General: Normal range of motion.     Cervical back: Normal range of motion and neck supple.  Skin:    General: Skin is warm and dry.  Neurological:     Mental Status: She is alert.  Psychiatric:        Mood and Affect: Mood normal.        Behavior: Behavior normal.      UC Treatments / Results  Labs (all labs ordered are listed, but only abnormal results are displayed) Labs Reviewed  POCT URINALYSIS DIP (DEVICE) - Abnormal; Notable for the following components:      Result Value   Bilirubin Urine SMALL (*)    All other components within normal limits  RPR  HIV ANTIBODY (ROUTINE TESTING W REFLEX)  POC URINE PREG, ED  CERVICOVAGINAL ANCILLARY ONLY  Pregnancy test is negative Urinalysis is largely negative  EKG   Radiology No results found.  Procedures Procedures (including critical care time)  Medications Ordered in UC Medications - No data to display  Initial Impression / Assessment and Plan / UC Course  I have reviewed the triage vital signs and the nursing notes.  Pertinent labs & imaging results that were available during my care of the patient were reviewed by me and considered in my medical decision making (see chart for details).      Patient declines pelvic exam She does want STD testing. Is advised to check my chart for her test results. Going to treat her with omeprazole for her stomach pain  She request pain medicine.  Is on Suboxone.  I  am reluctant to give her an NSAID with her abdominal pain, but we will give her low-dose of naproxen to take with food Follow-up will depend upon her STD culture and test results Final Clinical Impressions( s) / UC Diagnoses   Final diagnoses:  Abdominal pain, epigastric  Potential exposure to STD  Urine frequency     Discharge Instructions     You have been tested for multiple sexually transmitted diseases including gonorrhea, chlamydia, trichomonas, syphilis, and HIV I have also tested you for vaginal infections yeast and BV You need to check for your test results in a couple of days on MyChart.  You will be called if any of your test results are positive Your urine test and your pregnancy test are negative I am placing you on omeprazole 40 mg a day once a day for your stomach pain Go to your primary care doctor, [place], for ongoing problems    ED Prescriptions    Medication Sig Dispense Auth. Provider   omeprazole (PRILOSEC) 40 MG capsule Take 1 capsule (40 mg total) by mouth daily. 30 capsule Raylene Everts, MD   naproxen (NAPROSYN) 375 MG tablet Take 1 pill 2 times a day with food 20 tablet Raylene Everts, MD     PDMP not reviewed this encounter.   Raylene Everts, MD 09/12/19 [phone]

## 2019-09-12 NOTE — ED Triage Notes (Addendum)
Pt c/o nausea, vaginal pain, 10/10 pulling pain mid upper abdominal area, frequent urination7/10 throbbing non radiating right sided chest pain since yesterday. PT denies SOB. Pt has non labored breathing. Pt wants STI testing.

## 2019-09-12 NOTE — Discharge Instructions (Signed)
You have been tested for multiple sexually transmitted diseases including gonorrhea, chlamydia, trichomonas, syphilis, and HIV I have also tested you for vaginal infections yeast and BV You need to check for your test results in a couple of days on MyChart.  You will be called if any of your test results are positive Your urine test and your pregnancy test are negative I am placing you on omeprazole 40 mg a day once a day for your stomach pain Go to your primary care doctor, [place], for ongoing problems

## 2019-09-13 LAB — RPR: RPR Ser Ql: NONREACTIVE

## 2019-09-14 ENCOUNTER — Telehealth (HOSPITAL_COMMUNITY): Payer: Self-pay | Admitting: Orthopedic Surgery

## 2019-09-14 LAB — CERVICOVAGINAL ANCILLARY ONLY
Bacterial Vaginitis (gardnerella): POSITIVE — AB
Candida Glabrata: NEGATIVE
Candida Vaginitis: NEGATIVE
Chlamydia: NEGATIVE
Comment: NEGATIVE
Comment: NEGATIVE
Comment: NEGATIVE
Comment: NEGATIVE
Comment: NEGATIVE
Comment: NORMAL
Neisseria Gonorrhea: NEGATIVE
Trichomonas: NEGATIVE

## 2019-09-14 MED ORDER — METRONIDAZOLE 500 MG PO TABS: 500.0000 mg | ORAL_TABLET | Freq: Two times a day (BID) | ORAL | 0 refills | Status: DC

## 2019-09-26 ENCOUNTER — Encounter (HOSPITAL_COMMUNITY): Payer: Self-pay | Admitting: Emergency Medicine

## 2019-09-26 ENCOUNTER — Emergency Department (HOSPITAL_COMMUNITY)
Admission: EM | Admit: 2019-09-26 | Discharge: 2019-09-27 | Disposition: A | Discharge status: Home / Self Care | Payer: Medicaid Other | Attending: Emergency Medicine | Admitting: Emergency Medicine

## 2019-09-26 DIAGNOSIS — Z5321 Procedure and treatment not carried out due to patient leaving prior to being seen by health care provider: Secondary | ICD-10-CM | POA: Diagnosis not present

## 2019-09-26 DIAGNOSIS — R109 Unspecified abdominal pain: Secondary | ICD-10-CM | POA: Insufficient documentation

## 2019-09-26 NOTE — ED Triage Notes (Addendum)
Per EMS, patient c/o lower abdominal pain and pain in pelvis x40 minutes. Denies N/V/D. Denies vaginal discharge and vaginal bleeding.  Patient unknown LMP. Uncooperative answering further questions.

## 2019-09-26 NOTE — ED Notes (Signed)
Called for labs No answer x1

## 2019-09-27 ENCOUNTER — Emergency Department (HOSPITAL_COMMUNITY): Payer: Medicaid Other

## 2019-09-27 ENCOUNTER — Other Ambulatory Visit: Payer: Self-pay

## 2019-09-27 ENCOUNTER — Emergency Department (HOSPITAL_COMMUNITY)
Admission: EM | Admit: 2019-09-27 | Discharge: 2019-09-27 | Disposition: A | Discharge status: Home / Self Care | Payer: Medicaid Other | Source: Home / Self Care | Attending: Emergency Medicine | Admitting: Emergency Medicine

## 2019-09-27 ENCOUNTER — Encounter (HOSPITAL_COMMUNITY): Payer: Self-pay | Admitting: Emergency Medicine

## 2019-09-27 DIAGNOSIS — R1030 Lower abdominal pain, unspecified: Secondary | ICD-10-CM

## 2019-09-27 DIAGNOSIS — N9489 Other specified conditions associated with female genital organs and menstrual cycle: Secondary | ICD-10-CM

## 2019-09-27 LAB — CBC
HCT: 38.2 % (ref 36.0–46.0)
Hemoglobin: 12.7 g/dL (ref 12.0–15.0)
MCH: 30.2 pg (ref 26.0–34.0)
MCHC: 33.2 g/dL (ref 30.0–36.0)
MCV: 91 fL (ref 80.0–100.0)
Platelets: 238 10*3/uL (ref 150–400)
RBC: 4.2 MIL/uL (ref 3.87–5.11)
RDW: 14.5 % (ref 11.5–15.5)
WBC: 10.3 10*3/uL (ref 4.0–10.5)
nRBC: 0 % (ref 0.0–0.2)

## 2019-09-27 LAB — URINALYSIS, ROUTINE W REFLEX MICROSCOPIC
Bilirubin Urine: NEGATIVE
Glucose, UA: NEGATIVE mg/dL
Hgb urine dipstick: NEGATIVE
Ketones, ur: NEGATIVE mg/dL
Nitrite: NEGATIVE
Protein, ur: NEGATIVE mg/dL
Specific Gravity, Urine: 1.024 (ref 1.005–1.030)
pH: 5 (ref 5.0–8.0)

## 2019-09-27 LAB — COMPREHENSIVE METABOLIC PANEL
ALT: 20 U/L (ref 0–44)
AST: 21 U/L (ref 15–41)
Albumin: 3 g/dL — ABNORMAL LOW (ref 3.5–5.0)
Alkaline Phosphatase: 46 U/L (ref 38–126)
Anion gap: 8 (ref 5–15)
BUN: 6 mg/dL (ref 6–20)
CO2: 24 mmol/L (ref 22–32)
Calcium: 8.9 mg/dL (ref 8.9–10.3)
Chloride: 109 mmol/L (ref 98–111)
Creatinine, Ser: 0.82 mg/dL (ref 0.44–1.00)
GFR calc Af Amer: >60 mL/min (ref >60)
GFR calc non Af Amer: >60 mL/min (ref >60)
Glucose, Bld: 102 mg/dL — ABNORMAL HIGH (ref 70–99)
Potassium: 3.9 mmol/L (ref 3.5–5.1)
Sodium: 141 mmol/L (ref 135–145)
Total Bilirubin: 0.7 mg/dL (ref 0.3–1.2)
Total Protein: 5.2 g/dL — ABNORMAL LOW (ref 6.5–8.1)

## 2019-09-27 LAB — I-STAT BETA HCG BLOOD, ED (MC, WL, AP ONLY): I-stat hCG, quantitative: <5 m[IU]/mL

## 2019-09-27 LAB — WET PREP, GENITAL
Clue Cells Wet Prep HPF POC: NONE SEEN
Sperm: NONE SEEN
Trich, Wet Prep: NONE SEEN
Yeast Wet Prep HPF POC: NONE SEEN

## 2019-09-27 LAB — LIPASE, BLOOD: Lipase: 23 U/L (ref 11–51)

## 2019-09-27 MED ORDER — IOHEXOL 300 MG/ML  SOLN
100.0000 mL | Freq: Once | INTRAMUSCULAR | Status: AC | PRN
  Administered 2019-09-27: 100 mL via INTRAVENOUS

## 2019-09-27 MED ORDER — SODIUM CHLORIDE 0.9 % IV BOLUS
1000.0000 mL | Freq: Once | INTRAVENOUS | Status: AC
  Administered 2019-09-27: 1000 mL via INTRAVENOUS

## 2019-09-27 MED ORDER — DOXYCYCLINE HYCLATE 100 MG PO CAPS
100.0000 mg | ORAL_CAPSULE | Freq: Two times a day (BID) | ORAL | 0 refills | Status: AC
Start: 2019-09-27 — End: 2019-10-11

## 2019-09-27 MED ORDER — LIDOCAINE HCL (PF) 1 % IJ SOLN
INTRAMUSCULAR | Status: AC
  Filled 2019-09-27: qty 5

## 2019-09-27 MED ORDER — CEFTRIAXONE SODIUM 500 MG IJ SOLR
500.0000 mg | Freq: Once | INTRAMUSCULAR | Status: AC
  Administered 2019-09-27: 500 mg via INTRAMUSCULAR
  Filled 2019-09-27: qty 500

## 2019-09-27 MED ORDER — KETOROLAC TROMETHAMINE 15 MG/ML IJ SOLN
15.0000 mg | Freq: Once | INTRAMUSCULAR | Status: AC
  Administered 2019-09-27: 15 mg via INTRAVENOUS
  Filled 2019-09-27: qty 1

## 2019-09-27 MED ORDER — SODIUM CHLORIDE 0.9% FLUSH
3.0000 mL | Freq: Once | INTRAVENOUS | Status: AC
  Administered 2019-09-27: 3 mL via INTRAVENOUS

## 2019-09-27 MED ORDER — NAPROXEN 375 MG PO TABS: 375.0000 mg | ORAL_TABLET | Freq: Two times a day (BID) | ORAL | 0 refills | Status: AC

## 2019-09-27 MED ORDER — FENTANYL CITRATE (PF) 100 MCG/2ML IJ SOLN
50.0000 ug | Freq: Once | INTRAMUSCULAR | Status: AC
  Administered 2019-09-27: 50 ug via INTRAVENOUS
  Filled 2019-09-27: qty 2

## 2019-09-27 NOTE — ED Triage Notes (Signed)
Patient reports LLQ pain with nausea yesterday , no emesis or diarrhea , no fever or chills .

## 2019-09-27 NOTE — Discharge Instructions (Addendum)
You were provided with a copy of your CT abdomen.  You will need to follow-up with your gynecologist along with primary care physician, a referral for both are attached to your chart.  You will also be going home on a short prescription of antibiotics, you will need to take this medication, 1 tablet daily for the next 14 days.  Please be aware he will need to complete this medication.  I have also prescribed medication to help with your pain, please take this as needed.

## 2019-09-27 NOTE — ED Provider Notes (Signed)
[facility] EMERGENCY DEPARTMENT Provider Note   CSN: [id number] Arrival date & time: 09/27/19  0175     History Chief Complaint  Patient presents with  . Abdominal Pain    [name] is a 39 y.o. female.  39 y.o female with a PMH of Anemia, Bipolar, Chlamydia, substance abuse presents to the ED with chief complaint of lower abdominal pain for the past day.  Reports she began feeling a sharp, consistent aching suprapubic pain with radiation to her whole lower abdomen.  Reports the pain has increased in severity, states this is worsening at this time.  He also endorses some nausea but has not had any episodes of vomiting or diarrhea.  States that she feels like she is somewhat straining to defecate, has not had any blood in her stool.  Patient reports she has been pregnant 6-8 times with 10 children at home and this is "the worst pain of my life ".  She has not taken any medication for improvement in her symptoms.  Pain is exacerbated with sitting up.  Does have a prior history of a hernia repaired.  Last bowel movement was yesterday with no blood in her stool.  No fevers, last menstrual period at the end of May.  She is not " currently sexually active ", although her last sexual encounter was a week ago.  No vaginal discharge, no vaginal bleeding, no fevers.     The history is provided by the patient and medical records.  Abdominal Pain Pain location:  Suprapubic, LLQ and RLQ Pain quality: aching and stabbing   Pain radiates to:  Does not radiate Pain severity:  Moderate Onset quality:  Gradual Duration:  1 day Timing:  Constant Progression:  Worsening Chronicity:  New Context: alcohol use   Context: not suspicious food intake   Associated symptoms: nausea   Associated symptoms: no chest pain, no constipation, no diarrhea, no fever, no shortness of breath, no sore throat, no vaginal bleeding, no vaginal discharge and no vomiting        Past Medical  History:  Diagnosis Date  . Anemia   . Bipolar 1 disorder ([place])   . Chlamydia   . Chronic abdominal pain   . Chronic nausea   . Deliberate self-cutting   . History of substance abuse ([place])   . HSV-2 infection 2015  . Hypertension   . Infection    MRSA in 1995, negative since  . Schizophrenia ([place])   . Seizures ([place])    Not recently  . Sickle cell trait ([place])   . Trichomonas infection     Patient Active Problem List   Diagnosis Date Noted  . Abdominal pain 12/02/2017  . Back pain affecting pregnancy in third trimester 03/20/2017  . Labor, premature with delivery 03/20/2017  . Preterm labor in third trimester 03/03/2017  . Chronic hypertension in pregnancy 02/21/2017  . GERD (gastroesophageal reflux disease) 02/21/2017  . Umbilical hernia 01/30/8526  . Hereditary disease in family possibly affecting fetus, fetus 36   . Penicillin allergy 10/02/2016  . Opiate use 09/25/2016  . Supervision of high risk pregnancy, antepartum 09/19/2016  . History of herpes genitalis 09/19/2016  . History of trichomoniasis 09/19/2016  . Advanced maternal age in multigravida, second trimester 09/19/2016  . [place] multiparity 09/19/2016  . Short interval between pregnancies affecting pregnancy in first trimester, antepartum 09/19/2016  . Dichorionic diamniotic twin gestation 09/19/2016  . Chronic hypertension 07/03/2016  . Tobacco abuse 12/19/2014  . Bipolar  disorder ([place]) 12/19/2014  . History of substance abuse ([place]) 12/19/2014  . Sickle cell trait ([place]) 12/19/2014    Past Surgical History:  Procedure Laterality Date  . CESAREAN SECTION N/A 03/21/2017   Procedure: CESAREAN SECTION;  Surgeon: Aletha Halim, MD;  Location: [facility];  Service: Obstetrics;  Laterality: N/A;  . DILATION AND CURETTAGE OF UTERUS    . HEMORROIDECTOMY  2010  . plastic surgery on face    . UMBILICAL HERNIA REPAIR N/A 12/02/2017   Procedure: LAPAROSCOPIC EXCISION OF MESH ERAS PATHWAY;  Surgeon:  Kinsinger, Arta Bruce, MD;  Location: [facility];  Service: General;  Laterality: N/A;     OB History    Gravida  15   Para  9   Term  8   Preterm  1   AB  6   Living  10     SAB  6   TAB  0   Ectopic  0   Multiple  1   Live Births  10           Family History  Problem Relation Age of Onset  . Cancer Mother   . Heart failure Mother   . Hypertension Mother   . Stroke Mother   . Diabetes Mother   . Healthy Father   . Hypertension Maternal Grandmother   . Anesthesia problems Neg Hx     Social History   Tobacco Use  . Smoking status: Former Smoker    Packs/day: 0.50    Years: 1.50    Pack years: 0.75    Types: Cigarettes    Quit date: 04/09/2015    Years since quitting: 4.4  . Smokeless tobacco: Never Used  Vaping Use  . Vaping Use: Never used  Substance Use Topics  . Alcohol use: No    Comment: hx drug use  . Drug use: No    Types: Marijuana, Cocaine    Comment: past use, 4 years ago, edibles 03/2019    Home Medications Prior to Admission medications   Medication Sig Start Date End Date Taking? Authorizing Provider  escitalopram (LEXAPRO) 10 MG tablet Take 10 mg by mouth daily.  08/22/18  Yes [provider]  lamoTRIgine (LAMICTAL) 25 MG tablet Take 25 mg by mouth 2 (two) times daily. 09/14/19  Yes [provider]  NARCAN 4 MG/0.1ML LIQD nasal spray kit Place 1 spray into the nose once.  09/10/18  Yes [provider]  QUEtiapine (SEROQUEL) 100 MG tablet Take 100 mg by mouth at bedtime. 09/14/19  Yes [provider]  SUBOXONE 8-2 MG FILM Take 1 Film by mouth in the morning, at noon, and at bedtime.  10/16/18  Yes [provider]  doxycycline (VIBRAMYCIN) 100 MG capsule Take 1 capsule (100 mg total) by mouth 2 (two) times daily for 14 days. 09/27/19 10/11/19  Janeece Fitting, PA-C  metroNIDAZOLE (FLAGYL) 500 MG tablet Take 1 tablet (500 mg total) by mouth 2 (two) times daily. Patient not taking: Reported on 09/27/2019  09/14/19   Chase Picket, MD  naproxen (NAPROSYN) 375 MG tablet Take 1 pill 2 times a day with food Patient not taking: Reported on 09/27/2019 09/12/19   Raylene Everts, MD  naproxen (NAPROSYN) 375 MG tablet Take 1 tablet (375 mg total) by mouth 2 (two) times daily for 7 days. 09/27/19 10/04/19  Janeece Fitting, PA-C  omeprazole (PRILOSEC) 40 MG capsule Take 1 capsule (40 mg total) by mouth daily. Patient not taking: Reported on 09/27/2019  09/12/19   Raylene Everts, MD    Allergies    Peanut-containing drug products, Amoxicillin, and Latex  Review of Systems   Review of Systems  Constitutional: Negative for fever.  HENT: Negative for sore throat.   Respiratory: Negative for shortness of breath.   Cardiovascular: Negative for chest pain.  Gastrointestinal: Positive for abdominal pain and nausea. Negative for blood in stool, constipation, diarrhea and vomiting.  Genitourinary: Positive for pelvic pain. Negative for flank pain, vaginal bleeding and vaginal discharge.  Musculoskeletal: Negative for back pain.  Skin: Negative for pallor and wound.  Neurological: Negative for light-headedness and headaches.  All other systems reviewed and are negative.   Physical Exam Updated Vital Signs BP 105/74   Pulse 74   Temp 98.4 F (36.9 C) (Oral)   Resp 16   Ht _0  (1.6 m)   Wt 75 kg   LMP 08/25/2019   SpO2 100%   BMI 29.29 kg/m   Physical Exam Vitals and nursing note reviewed. Exam conducted with a chaperone present.  Constitutional:      Appearance: She is well-developed.  HENT:     Head: Normocephalic and atraumatic.     Mouth/Throat:     Mouth: Mucous membranes are moist.  Cardiovascular:     Rate and Rhythm: Normal rate.  Pulmonary:     Effort: Pulmonary effort is normal.     Breath sounds: No wheezing or rales.     Comments: Lungs are clear to auscultation with any wheezing, rhonchi, rales. Abdominal:     General: Abdomen is flat. Bowel sounds are normal.      Palpations: Abdomen is soft.     Tenderness: There is abdominal tenderness in the right lower quadrant, suprapubic area and left lower quadrant. There is no right CVA tenderness or left CVA tenderness.     Hernia: No hernia is present. There is no hernia in the umbilical area.     Comments: If he can tenderness along the suprapubic, lower abdomen region.  Bowel sounds are normal.  Genitourinary:    Exam position: Supine.     Labia:        Right: No tenderness.        Left: No tenderness.      Comments: Significant amount of white copious foul smelling discharge noted. Mild CMT, no adnexa tenderness.  Chaperoned by Cruzita Lederer.  Skin:    General: Skin is warm and dry.  Neurological:     Mental Status: She is alert and oriented to person, place, and time.     ED Results / Procedures / Treatments   Labs (all labs ordered are listed, but only abnormal results are displayed) Labs Reviewed  WET PREP, GENITAL - Abnormal; Notable for the following components:      Result Value   WBC, Wet Prep HPF POC MANY (*)    All other components within normal limits  COMPREHENSIVE METABOLIC PANEL - Abnormal; Notable for the following components:   Glucose, Bld 102 (*)    Total Protein 5.2 (*)    Albumin 3.0 (*)    All other components within normal limits  URINALYSIS, ROUTINE W REFLEX MICROSCOPIC - Abnormal; Notable for the following components:   Leukocytes,Ua TRACE (*)    Bacteria, UA RARE (*)    All other components within normal limits  LIPASE, BLOOD  CBC  I-STAT BETA HCG BLOOD, ED ([facility], [facility], [facility] ONLY)  GC/CHLAMYDIA PROBE AMP ([place]) NOT AT [facility]  EKG None  Radiology CT ABDOMEN PELVIS W CONTRAST  Result Date: 09/27/2019 CLINICAL DATA:  Left lower quadrant abdominal pain, nausea EXAM: CT ABDOMEN AND PELVIS WITH CONTRAST TECHNIQUE: Multidetector CT imaging of the abdomen and pelvis was performed using the standard protocol following bolus administration of intravenous contrast.  CONTRAST:  125m OMNIPAQUE IOHEXOL 300 MG/ML  SOLN COMPARISON:  11/25/2017 FINDINGS: Lower chest: No acute abnormality. Hepatobiliary: Hepatic steatosis. Small fluid attenuation cysts or hemangiomata unchanged compared to prior examination. No gallstones, gallbladder wall thickening, or biliary dilatation. Pancreas: Unremarkable. No pancreatic ductal dilatation or surrounding inflammatory changes. Spleen: Normal in size without significant abnormality. Adrenals/Urinary Tract: Adrenal glands are unremarkable. Tiny nonobstructive calculi of the superior pole of the left kidney (series 6, image 88) and inferior pole of the right kidney (series 6, image 78). No hydronephrosis. Bladder is unremarkable. Stomach/Bowel: Stomach is within normal limits. Appendix appears normal. No evidence of bowel wall thickening, distention, or inflammatory changes. Vascular/Lymphatic: The bilateral ovarian veins are enlarged, particularly the right, measuring approximately 1.4 cm near the confluence with the IVC (series 3, image 42). 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 No enlarged abdominal or pelvic lymph nodes. Reproductive: No mass or other significant abnormality. Other: No abdominal wall hernia or abnormality. No abdominopelvic ascites. Musculoskeletal: No acute or significant osseous findings. IMPRESSION: 1. No acute CT findings of the abdomen or pelvis to explain left lower quadrant pain. 2. The bilateral ovarian veins are enlarged, particularly the right, measuring approximately 1.4 cm near the confluence with the IVC. 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 These findings can be seen in pelvic congestion syndrome, if clinically referable symptoms are present. 3. Hepatic steatosis. 4. Nonobstructive bilateral nephrolithiasis.  No hydronephrosis. Electronically Signed   By: AEddie CandleM.D.   On: 09/27/2019 11:34     Procedures Procedures (including critical care time)  Medications Ordered in ED Medications  cefTRIAXone (ROCEPHIN) injection 500 mg (has no administration in time range)  ketorolac (TORADOL) 15 MG/ML injection 15 mg (has no administration in time range)  sodium chloride flush (NS) 0.9 % injection 3 mL (3 mLs Intravenous Given 09/27/19 1013)  fentaNYL (SUBLIMAZE) injection 50 mcg (50 mcg Intravenous Given 09/27/19 1013)  sodium chloride 0.9 % bolus 1,000 mL (1,000 mLs Intravenous New Bag/Given 09/27/19 1012)  iohexol (OMNIPAQUE) 300 MG/ML solution 100 mL (100 mLs Intravenous Contrast Given 09/27/19 1100)    ED Course  I have reviewed the triage vital signs and the nursing notes.  Pertinent labs & imaging results that were available during my care of the patient were reviewed by me and considered in my medical decision making (see chart for details).  Clinical Course as of Sep 26 1212  Mon Sep 27, 2019  0849 Leukocytes,Ua(!): TRACE [JS]  1141 WBC, Wet Prep HPF POC(!): MANY [JS]    Clinical Course User Index [JS] SJaneece Fitting PA-C   MDM Rules/Calculators/A&P  Patient with past medical history of substance abuse presents to the ED with complaints of lower abdominal pain since yesterday.  According to chart review, she is had multiple visits to urgent care for pelvic pain along with STI complaints.  Today she reports her pain feels different, she states this is the worst pain of her life aside from her prior pregnancies.  She is currently sexually active although she denied this, last sexual encounter was about a week ago.  She does not have any  vaginal bleeding, vaginal discharge.  Her last menstrual cycle was around last month.  No fever, vomiting, blood in her stool.  During evaluation she is not ill, nontoxic appearing, vitals are within normal limits.  Abdomen is tender to palpation with some voluntary guarding along the lower aspect.  Bowel sounds are present and normal.  No back  pain.  Lungs are clear to auscultation without any wheezing or rales.   Interpretation of his labs showed a CBC without any WBCs, hemoglobin is within normal limits. Lipase is unremarkable. CMP without any electrolyte derangement, creatinine level is at her baseline.  LFTs are unremarkable.  She does report a prior history of peptic ulcers, we discussed that this will unlikely be the cause of her symptoms that she is having more so lower quadrant pain.  UA is remarkable for trace of leukocytes and rare bacteria but is also contaminated with a high amount of squamous cells present.  ECG is negative on today's visit.  Leg exam performed with Magda Paganini RN at the bedside, significant amount of copious malodorous white discharge noted.  Mild CMT, no adnexal tenderness.  After chart review, patient has had visits to urgent care within the past month, reports she has not had any pelvic exam or STI testing at that time.  Prep along with GC have been collected.Some suspicion for PID. However, does have significant tenderness of her abdomen, cannot rule out diverticulitis. Prior CT abdomen from 2019 was benign.   After performing pelvic exam, patient is requesting to be tested for ovarian cancer, lupus.  We discussed that this will likely need to be done by primary care physician.  She is asking for referral for gynecologist as well.Given fentanyl 50 mcg for pain control.  CT Abdomen and Pelvis:  1. No acute CT findings of the abdomen or pelvis to explain left  lower quadrant pain.  2. The bilateral ovarian veins are enlarged, particularly the right,  measuring approximately 1.4 cm near the confluence with the IVC.  There are likely prominent uterine varices, not well appreciated due  to phase of contrast. These findings can be seen in pelvic  congestion syndrome, if clinically referable symptoms are present.  3. Hepatic steatosis.  4. Nonobstructive bilateral nephrolithiasis. No hydronephrosis.      Due  to patient's mild CMT and patient's WBC during her wet prep.Will treat for PID, she is only sexually active with last sexual intercourse about a week ago.  Although she denied current activity.  She does have several visits to urgent care for pelvic pain.  Patient wants to go home on a short course of anti-inflammatories.  She is asking for a BCP referral along with a GYN follow-up.  This will be provided for patient.  Vitals are within normal limits, patient stable for discharge.  Patient also provided with CT Abdomen/Pelvis report, additional pain medication was ordered. Encouraged GYN follow up, she is agreeable.   Portions of this note were generated with [employer]. Dictation errors may occur despite best attempts at proofreading.  Final Clinical Impression(s) / ED Diagnoses Final diagnoses:  Lower abdominal pain  Pelvic congestion syndrome    Rx / DC Orders ED Discharge Orders         Ordered    doxycycline (VIBRAMYCIN) 100 MG capsule  2 times daily     Discontinue  Reprint     09/27/19 1147    naproxen (NAPROSYN) 375 MG tablet  2 times daily     Discontinue  Reprint     09/27/19 Angie, Aerielle [place], PA-C 09/27/19 1214    Truddie Hidden, MD 09/27/19 1450

## 2019-09-28 LAB — GC/CHLAMYDIA PROBE AMP (~~LOC~~) NOT AT ARMC
Chlamydia: NEGATIVE
Comment: NEGATIVE
Comment: NORMAL
Neisseria Gonorrhea: NEGATIVE

## 2019-10-29 ENCOUNTER — Other Ambulatory Visit: Payer: Self-pay

## 2019-10-29 ENCOUNTER — Ambulatory Visit (HOSPITAL_COMMUNITY)
Admission: EM | Admit: 2019-10-29 | Discharge: 2019-10-29 | Disposition: A | Discharge status: Home / Self Care | Payer: Medicaid Other | Attending: Family Medicine | Admitting: Family Medicine

## 2019-10-29 ENCOUNTER — Encounter (HOSPITAL_COMMUNITY): Payer: Self-pay

## 2019-10-29 DIAGNOSIS — L309 Dermatitis, unspecified: Secondary | ICD-10-CM | POA: Insufficient documentation

## 2019-10-29 DIAGNOSIS — N760 Acute vaginitis: Secondary | ICD-10-CM | POA: Insufficient documentation

## 2019-10-29 DIAGNOSIS — Z3202 Encounter for pregnancy test, result negative: Secondary | ICD-10-CM

## 2019-10-29 DIAGNOSIS — R109 Unspecified abdominal pain: Secondary | ICD-10-CM | POA: Diagnosis not present

## 2019-10-29 DIAGNOSIS — Z760 Encounter for issue of repeat prescription: Secondary | ICD-10-CM | POA: Insufficient documentation

## 2019-10-29 DIAGNOSIS — R1010 Upper abdominal pain, unspecified: Secondary | ICD-10-CM | POA: Diagnosis present

## 2019-10-29 LAB — POCT URINALYSIS DIP (DEVICE)
Bilirubin Urine: NEGATIVE
Glucose, UA: NEGATIVE mg/dL
Hgb urine dipstick: NEGATIVE
Ketones, ur: NEGATIVE mg/dL
Leukocytes,Ua: NEGATIVE
Nitrite: NEGATIVE
Protein, ur: NEGATIVE mg/dL
Specific Gravity, Urine: >=1.03 (ref 1.005–1.030)
Urobilinogen, UA: 0.2 mg/dL (ref 0.0–1.0)
pH: 6 (ref 5.0–8.0)

## 2019-10-29 LAB — POC URINE PREG, ED: Preg Test, Ur: NEGATIVE

## 2019-10-29 MED ORDER — TRIAMCINOLONE ACETONIDE 0.1 % EX CREA
1.0000 | TOPICAL_CREAM | Freq: Two times a day (BID) | CUTANEOUS | 0 refills | Status: DC
Start: 2019-10-29 — End: 2020-02-03

## 2019-10-29 MED ORDER — NAPROXEN 500 MG PO TABS: 500.0000 mg | ORAL_TABLET | Freq: Two times a day (BID) | ORAL | 0 refills | Status: DC

## 2019-10-29 MED ORDER — FLUCONAZOLE 150 MG PO TABS
150.0000 mg | ORAL_TABLET | Freq: Once | ORAL | 0 refills | Status: AC
Start: 2019-10-29 — End: 2019-10-29

## 2019-10-29 MED ORDER — DOXYCYCLINE HYCLATE 100 MG PO CAPS
100.0000 mg | ORAL_CAPSULE | Freq: Two times a day (BID) | ORAL | 0 refills | Status: AC
Start: 2019-10-29 — End: 2019-11-05

## 2019-10-29 MED ORDER — METRONIDAZOLE 500 MG PO TABS
500.0000 mg | ORAL_TABLET | Freq: Two times a day (BID) | ORAL | 0 refills | Status: AC
Start: 2019-10-29 — End: 2019-11-05

## 2019-10-29 NOTE — ED Provider Notes (Signed)
[place]    CSN: [id number] Arrival date & time: 10/29/19  1157      History   Chief Complaint Chief Complaint  Patient presents with  . Vaginitis  . Abdominal Pain    HPI [name] is a 39 y.o. female presenting today for evaluation of abdominal pain and vaginal discharge.  Patient reports that over the past 2 days she has had thick white creamy discharge along with burning which she feels all the time, but also with urination.  Has had some abdominal pain around her umbilicus as well as to the left.  Has been eating and drinking normally without affecting pain.  Has had increasing pain with straining for bowel movements but bowels have been regular.  Has history of recurrent yeast/BV.  Reports recent PID treatment which she felt symptoms similar to today.  Reports significant improvement with doxycycline.  Last menstrual cycle was early July.  Is not on birth control.  Requesting refill of triamcinolone cream for rash on face.  Reports eczema flaring up recently.  HPI  Past Medical History:  Diagnosis Date  . Anemia   . Bipolar 1 disorder ([place])   . Chlamydia   . Chronic abdominal pain   . Chronic nausea   . Deliberate self-cutting   . History of substance abuse ([place])   . HSV-2 infection 2015  . Hypertension   . Infection    MRSA in 1995, negative since  . Schizophrenia ([place])   . Seizures ([place])    Not recently  . Sickle cell trait ([place])   . Trichomonas infection     Patient Active Problem List   Diagnosis Date Noted  . Abdominal pain 12/02/2017  . Back pain affecting pregnancy in third trimester 03/20/2017  . Labor, premature with delivery 03/20/2017  . Preterm labor in third trimester 03/03/2017  . Chronic hypertension in pregnancy 02/21/2017  . GERD (gastroesophageal reflux disease) 02/21/2017  . Umbilical hernia 20/94/7096  . Hereditary disease in family possibly affecting fetus, fetus 63   . Penicillin allergy 10/02/2016  . Opiate use  09/25/2016  . Supervision of high risk pregnancy, antepartum 09/19/2016  . History of herpes genitalis 09/19/2016  . History of trichomoniasis 09/19/2016  . Advanced maternal age in multigravida, second trimester 09/19/2016  . [place] multiparity 09/19/2016  . Short interval between pregnancies affecting pregnancy in first trimester, antepartum 09/19/2016  . Dichorionic diamniotic twin gestation 09/19/2016  . Chronic hypertension 07/03/2016  . Tobacco abuse 12/19/2014  . Bipolar disorder ([place]) 12/19/2014  . History of substance abuse ([place]) 12/19/2014  . Sickle cell trait ([place]) 12/19/2014    Past Surgical History:  Procedure Laterality Date  . CESAREAN SECTION N/A 03/21/2017   Procedure: CESAREAN SECTION;  Surgeon: Aletha Halim, MD;  Location: [facility];  Service: Obstetrics;  Laterality: N/A;  . DILATION AND CURETTAGE OF UTERUS    . HEMORROIDECTOMY  2010  . plastic surgery on face    . UMBILICAL HERNIA REPAIR N/A 12/02/2017   Procedure: LAPAROSCOPIC EXCISION OF MESH ERAS PATHWAY;  Surgeon: Kinsinger, Arta Bruce, MD;  Location: [facility];  Service: General;  Laterality: N/A;    OB History    Gravida  15   Para  9   Term  8   Preterm  1   AB  6   Living  10     SAB  6   TAB  0   Ectopic  0   Multiple  1   Live  Births  10            Home Medications    Prior to Admission medications   Medication Sig Start Date End Date Taking? Authorizing Provider  doxycycline (VIBRAMYCIN) 100 MG capsule Take 1 capsule (100 mg total) by mouth 2 (two) times daily for 7 days. 10/29/19 11/05/19  Doren Kaspar C, PA-C  escitalopram (LEXAPRO) 10 MG tablet Take 10 mg by mouth daily.  08/22/18   [provider]  fluconazole (DIFLUCAN) 150 MG tablet Take 1 tablet (150 mg total) by mouth once for 1 dose. 10/29/19 10/29/19  Jacy Brocker C, PA-C  lamoTRIgine (LAMICTAL) 25 MG tablet Take 25 mg by mouth 2 (two) times daily. 09/14/19   [provider]    metroNIDAZOLE (FLAGYL) 500 MG tablet Take 1 tablet (500 mg total) by mouth 2 (two) times daily for 7 days. 10/29/19 11/05/19  Shawnelle Spoerl C, PA-C  naproxen (NAPROSYN) 500 MG tablet Take 1 tablet (500 mg total) by mouth 2 (two) times daily. 10/29/19   Tanayia Wahlquist C, PA-C  NARCAN 4 MG/0.1ML LIQD nasal spray kit Place 1 spray into the nose once.  09/10/18   [provider]  QUEtiapine (SEROQUEL) 100 MG tablet Take 100 mg by mouth at bedtime. 09/14/19   [provider]  SUBOXONE 8-2 MG FILM Take 1 Film by mouth in the morning, at noon, and at bedtime.  10/16/18   [provider]  triamcinolone cream (KENALOG) 0.1 % Apply 1 application topically 2 (two) times daily. 10/29/19   Adysson Revelle C, PA-C  omeprazole (PRILOSEC) 40 MG capsule Take 1 capsule (40 mg total) by mouth daily. Patient not taking: Reported on 09/27/2019 09/12/19 10/29/19  Raylene Everts, MD    Family History Family History  Problem Relation Age of Onset  . Cancer Mother   . Heart failure Mother   . Hypertension Mother   . Stroke Mother   . Diabetes Mother   . Healthy Father   . Hypertension Maternal Grandmother   . Anesthesia problems Neg Hx     Social History Social History   Tobacco Use  . Smoking status: Former Smoker    Packs/day: 0.50    Years: 1.50    Pack years: 0.75    Types: Cigarettes    Quit date: 04/09/2015    Years since quitting: 4.5  . Smokeless tobacco: Never Used  Vaping Use  . Vaping Use: Never used  Substance Use Topics  . Alcohol use: No    Comment: hx drug use  . Drug use: No    Types: Marijuana, Cocaine    Comment: past use, 4 years ago, edibles 03/2019     Allergies   Peanut-containing drug products, Amoxicillin, and Latex   Review of Systems Review of Systems  Constitutional: Negative for fever.  Respiratory: Negative for shortness of breath.   Cardiovascular: Negative for chest pain.  Gastrointestinal: Positive for abdominal pain. Negative for  diarrhea, nausea and vomiting.  Genitourinary: Positive for dysuria and vaginal discharge. Negative for flank pain, genital sores, hematuria, menstrual problem, vaginal bleeding and vaginal pain.  Musculoskeletal: Negative for back pain.  Skin: Negative for rash.  Neurological: Negative for dizziness, light-headedness and headaches.     Physical Exam Triage Vital Signs ED Triage Vitals  Enc Vitals Group     BP      Pulse      Resp      Temp      Temp src  SpO2      Weight      Height      Head Circumference      Peak Flow      Pain Score      Pain Loc      Pain Edu?      Excl. in [place]?    No data found.  Updated Vital Signs BP (!) 136/76 (BP Location: Right Arm)   Pulse 77   Temp 99.3 F (37.4 C) (Oral)   Resp 17   LMP 10/09/2019   SpO2 100%   Visual Acuity Right Eye Distance:   Left Eye Distance:   Bilateral Distance:    Right Eye Near:   Left Eye Near:    Bilateral Near:     Physical Exam Vitals and nursing note reviewed.  Constitutional:      Appearance: She is well-developed.     Comments: No acute distress  HENT:     Head: Normocephalic and atraumatic.     Nose: Nose normal.  Eyes:     Conjunctiva/sclera: Conjunctivae normal.  Cardiovascular:     Rate and Rhythm: Normal rate.  Pulmonary:     Effort: Pulmonary effort is normal. No respiratory distress.  Abdominal:     General: There is no distension.     Comments: Soft, nondistended, nontender to light and deep palpation throughout abdomen  Musculoskeletal:        General: Normal range of motion.     Cervical back: Neck supple.  Skin:    General: Skin is warm and dry.  Neurological:     Mental Status: She is alert and oriented to person, place, and time.      UC Treatments / Results  Labs (all labs ordered are listed, but only abnormal results are displayed) Labs Reviewed  POC URINE PREG, ED  POCT URINALYSIS DIP (DEVICE)  CERVICOVAGINAL ANCILLARY ONLY    EKG   Radiology No  results found.  Procedures Procedures (including critical care time)  Medications Ordered in UC Medications - No data to display  Initial Impression / Assessment and Plan / UC Course  I have reviewed the triage vital signs and the nursing notes.  Pertinent labs & imaging results that were available during my care of the patient were reviewed by me and considered in my medical decision making (see chart for details).     Empirically treating for yeast and BV today with metronidazole and Diflucan.  Did opt to go ahead and provide repeat course of doxycycline to treat for chlamydia/PID/cervicitis.  Pregnancy test negative.  UA unremarkable.  Vaginal swab pending, will call with results and provide further treatment as needed.  Naprosyn for pain.  Discussed strict return precautions. Patient verbalized understanding and is agreeable with plan.  Final Clinical Impressions(s) / UC Diagnoses   Final diagnoses:  Pain of upper abdomen  Vaginitis and vulvovaginitis  Eczema, unspecified type  Medication refill     Discharge Instructions     May begin metronidazole and doxycycline twice daily for the next week.  1 tablet of Diflucan today, stating second tablet for after completion of antibiotics.  We are testing you for Gonorrhea, Chlamydia, Trichomonas, Yeast and Bacterial Vaginosis. We will call you if anything is positive and let you know if you require any further treatment. Please inform partners of any positive results.   Please return if symptoms not improving with treatment, development of fever, nausea, vomiting, abdominal pain.     ED Prescriptions  Medication Sig Dispense Auth. Provider   doxycycline (VIBRAMYCIN) 100 MG capsule Take 1 capsule (100 mg total) by mouth 2 (two) times daily for 7 days. 14 capsule Melisssa Donner C, PA-C   metroNIDAZOLE (FLAGYL) 500 MG tablet Take 1 tablet (500 mg total) by mouth 2 (two) times daily for 7 days. 14 tablet Jozi Malachi C, PA-C     fluconazole (DIFLUCAN) 150 MG tablet Take 1 tablet (150 mg total) by mouth once for 1 dose. 2 tablet Lysa Livengood C, PA-C   triamcinolone cream (KENALOG) 0.1 % Apply 1 application topically 2 (two) times daily. 80 g Tavita Eastham C, PA-C   naproxen (NAPROSYN) 500 MG tablet Take 1 tablet (500 mg total) by mouth 2 (two) times daily. 30 tablet Shemika Robbs, [place] C, PA-C     PDMP not reviewed this encounter.   Janith Lima, [place] 10/29/19 1339

## 2019-10-29 NOTE — Discharge Instructions (Signed)
May begin metronidazole and doxycycline twice daily for the next week.  1 tablet of Diflucan today, stating second tablet for after completion of antibiotics.  We are testing you for Gonorrhea, Chlamydia, Trichomonas, Yeast and Bacterial Vaginosis. We will call you if anything is positive and let you know if you require any further treatment. Please inform partners of any positive results.   Please return if symptoms not improving with treatment, development of fever, nausea, vomiting, abdominal pain.

## 2019-10-29 NOTE — ED Triage Notes (Signed)
Pt presents with lower abdominal pain, vaginal discharge and burning during urination for past few days.

## 2019-11-01 LAB — CERVICOVAGINAL ANCILLARY ONLY
Bacterial Vaginitis (gardnerella): NEGATIVE
Candida Glabrata: NEGATIVE
Candida Vaginitis: POSITIVE — AB
Chlamydia: NEGATIVE
Comment: NEGATIVE
Comment: NEGATIVE
Comment: NEGATIVE
Comment: NEGATIVE
Comment: NEGATIVE
Comment: NORMAL
Neisseria Gonorrhea: NEGATIVE
Trichomonas: NEGATIVE

## 2019-12-18 ENCOUNTER — Emergency Department (HOSPITAL_COMMUNITY)
Admission: EM | Admit: 2019-12-18 | Discharge: 2019-12-18 | Disposition: A | Discharge status: Home / Self Care | Payer: Medicaid Other | Attending: Emergency Medicine | Admitting: Emergency Medicine

## 2019-12-18 ENCOUNTER — Other Ambulatory Visit: Payer: Self-pay

## 2019-12-18 ENCOUNTER — Ambulatory Visit (HOSPITAL_COMMUNITY)
Admission: EM | Admit: 2019-12-18 | Discharge: 2019-12-18 | Disposition: A | Discharge status: Home / Self Care | Payer: Medicaid Other

## 2019-12-18 ENCOUNTER — Encounter (HOSPITAL_COMMUNITY): Payer: Self-pay | Admitting: *Deleted

## 2019-12-18 ENCOUNTER — Encounter (HOSPITAL_COMMUNITY): Payer: Self-pay | Admitting: Emergency Medicine

## 2019-12-18 DIAGNOSIS — L03113 Cellulitis of right upper limb: Secondary | ICD-10-CM

## 2019-12-18 DIAGNOSIS — L02413 Cutaneous abscess of right upper limb: Secondary | ICD-10-CM | POA: Insufficient documentation

## 2019-12-18 DIAGNOSIS — R110 Nausea: Secondary | ICD-10-CM | POA: Insufficient documentation

## 2019-12-18 DIAGNOSIS — R109 Unspecified abdominal pain: Secondary | ICD-10-CM | POA: Insufficient documentation

## 2019-12-18 DIAGNOSIS — R2231 Localized swelling, mass and lump, right upper limb: Secondary | ICD-10-CM | POA: Diagnosis present

## 2019-12-18 DIAGNOSIS — Z5321 Procedure and treatment not carried out due to patient leaving prior to being seen by health care provider: Secondary | ICD-10-CM | POA: Diagnosis not present

## 2019-12-18 HISTORY — DX: Systemic lupus erythematosus, unspecified: M32.9

## 2019-12-18 HISTORY — DX: Reserved for concepts with insufficient information to code with codable children: IMO0002

## 2019-12-18 LAB — I-STAT BETA HCG BLOOD, ED (MC, WL, AP ONLY): I-stat hCG, quantitative: 5.5 m[IU]/mL — ABNORMAL HIGH (ref <5)

## 2019-12-18 LAB — COMPREHENSIVE METABOLIC PANEL
ALT: 33 U/L (ref 0–44)
AST: 37 U/L (ref 15–41)
Albumin: 3.8 g/dL (ref 3.5–5.0)
Alkaline Phosphatase: 61 U/L (ref 38–126)
Anion gap: 11 (ref 5–15)
BUN: 5 mg/dL — ABNORMAL LOW (ref 6–20)
CO2: 23 mmol/L (ref 22–32)
Calcium: 9.2 mg/dL (ref 8.9–10.3)
Chloride: 104 mmol/L (ref 98–111)
Creatinine, Ser: 0.79 mg/dL (ref 0.44–1.00)
GFR calc Af Amer: >60 mL/min (ref >60)
GFR calc non Af Amer: >60 mL/min (ref >60)
Glucose, Bld: 97 mg/dL (ref 70–99)
Potassium: 3.4 mmol/L — ABNORMAL LOW (ref 3.5–5.1)
Sodium: 138 mmol/L (ref 135–145)
Total Bilirubin: 1.3 mg/dL — ABNORMAL HIGH (ref 0.3–1.2)
Total Protein: 6.4 g/dL — ABNORMAL LOW (ref 6.5–8.1)

## 2019-12-18 LAB — CBC
HCT: 38.8 % (ref 36.0–46.0)
Hemoglobin: 12.7 g/dL (ref 12.0–15.0)
MCH: 29.4 pg (ref 26.0–34.0)
MCHC: 32.7 g/dL (ref 30.0–36.0)
MCV: 89.8 fL (ref 80.0–100.0)
Platelets: 171 10*3/uL (ref 150–400)
RBC: 4.32 MIL/uL (ref 3.87–5.11)
RDW: 13.9 % (ref 11.5–15.5)
WBC: 11.9 10*3/uL — ABNORMAL HIGH (ref 4.0–10.5)
nRBC: 0 % (ref 0.0–0.2)

## 2019-12-18 LAB — LIPASE, BLOOD: Lipase: 23 U/L (ref 11–51)

## 2019-12-18 NOTE — Discharge Instructions (Addendum)
Go directly to the Emergency Department for evaluation of your arm swelling and redness.

## 2019-12-18 NOTE — ED Triage Notes (Signed)
Pt reports waking up this AM with pain, swelling in right middle finger, hand; with shooting pain radiating up into right axilla; reports a "lump" in right axilla.  Unsure if fevers.  RUE fingers warm, pink with prompt cap refill.  RUE very tender to palpation and position changes.  Reports being told she has lupus, does not have a PCP.

## 2019-12-18 NOTE — ED Triage Notes (Signed)
RN called to lobby d/t mother stating she is concerned pt is having a stroke.  Pt states she noticed a bump under her arm starting this morning.  Is quite painful and middle finger has some swelling.  Pain is making her nauseated.  No speech changes.  No facial droop noted.  Pt to complete registration and wait in lobby.

## 2019-12-18 NOTE — ED Triage Notes (Signed)
Pt. Stated, [name] had stomach pain that started this morning. Im also have a boil or something under my rt. Arm.

## 2019-12-18 NOTE — ED Notes (Signed)
Patient is being discharged from the Urgent [facility] Center and sent to the Emergency Department via private vehicle with family. Per K. Arlana Pouch, NP, patient is stable but in need of higher level of care due to RUE cellulitis. Patient is aware and verbalizes understanding of plan of care.  Vitals:   12/18/19 1346  BP: 134/90  Pulse: (!) 101  Resp: 18  Temp: 99.3 F (37.4 C)  SpO2: 97%

## 2019-12-18 NOTE — ED Provider Notes (Signed)
[name]    CSN: [id number] Arrival date & time: 12/18/19  1214      History   Chief Complaint Chief Complaint  Patient presents with  . Arm Pain    HPI [name] is a 39 y.o. female.   Patient presents with right hand and arm swelling, pain, redness since this morning she states the pain radiates up her arm into her axilla.  She reports feeling warm but has not taken her temperature.  She also reports nausea and dizziness.  No known injury or cause per patient.  She reports weakness in her arm due to pain with movement.  Her medical history is significant for substance abuse, schizophrenia, bipolar 1, seizures, HSV.  The history is provided by the patient.    Past Medical History:  Diagnosis Date  . Anemia   . Bipolar 1 disorder ([place])   . Chlamydia   . Chronic abdominal pain   . Chronic nausea   . Deliberate self-cutting   . History of substance abuse ([place])   . HSV-2 infection 2015  . Hypertension   . Infection    MRSA in 1995, negative since  . Lupus ([place])   . Schizophrenia ([place])   . Seizures ([place])    Not recently  . Sickle cell trait ([place])   . Trichomonas infection     Patient Active Problem List   Diagnosis Date Noted  . Abdominal pain 12/02/2017  . Back pain affecting pregnancy in third trimester 03/20/2017  . Labor, premature with delivery 03/20/2017  . Preterm labor in third trimester 03/03/2017  . Chronic hypertension in pregnancy 02/21/2017  . GERD (gastroesophageal reflux disease) 02/21/2017  . Umbilical hernia 27/06/5007  . Hereditary disease in family possibly affecting fetus, fetus 71   . Penicillin allergy 10/02/2016  . Opiate use 09/25/2016  . Supervision of high risk pregnancy, antepartum 09/19/2016  . History of herpes genitalis 09/19/2016  . History of trichomoniasis 09/19/2016  . Advanced maternal age in multigravida, second trimester 09/19/2016  . [place] multiparity 09/19/2016  . Short interval between pregnancies  affecting pregnancy in first trimester, antepartum 09/19/2016  . Dichorionic diamniotic twin gestation 09/19/2016  . Chronic hypertension 07/03/2016  . Tobacco abuse 12/19/2014  . Bipolar disorder ([place]) 12/19/2014  . History of substance abuse ([place]) 12/19/2014  . Sickle cell trait ([place]) 12/19/2014    Past Surgical History:  Procedure Laterality Date  . CESAREAN SECTION N/A 03/21/2017   Procedure: CESAREAN SECTION;  Surgeon: Aletha Halim, MD;  Location: [facility];  Service: Obstetrics;  Laterality: N/A;  . DILATION AND CURETTAGE OF UTERUS    . HEMORROIDECTOMY  2010  . HERNIA REPAIR    . plastic surgery on face    . UMBILICAL HERNIA REPAIR N/A 12/02/2017   Procedure: LAPAROSCOPIC EXCISION OF MESH ERAS PATHWAY;  Surgeon: Kinsinger, Arta Bruce, MD;  Location: [facility];  Service: General;  Laterality: N/A;    OB History    Gravida  15   Para  9   Term  8   Preterm  1   AB  6   Living  10     SAB  6   TAB  0   Ectopic  0   Multiple  1   Live Births  10            Home Medications    Prior to Admission medications   Medication Sig Start Date End Date Taking? Authorizing Provider  escitalopram (LEXAPRO) 10  MG tablet Take 10 mg by mouth daily.  08/22/18   [provider]  lamoTRIgine (LAMICTAL) 25 MG tablet Take 25 mg by mouth 2 (two) times daily. 09/14/19   [provider]  naproxen (NAPROSYN) 500 MG tablet Take 1 tablet (500 mg total) by mouth 2 (two) times daily. 10/29/19   Wieters, Hallie C, PA-C  NARCAN 4 MG/0.1ML LIQD nasal spray kit Place 1 spray into the nose once.  09/10/18   [provider]  QUEtiapine (SEROQUEL) 100 MG tablet Take 100 mg by mouth at bedtime. 09/14/19   [provider]  SUBOXONE 8-2 MG FILM Take 1 Film by mouth in the morning, at noon, and at bedtime.  10/16/18   [provider]  triamcinolone cream (KENALOG) 0.1 % Apply 1 application topically 2 (two) times daily. 10/29/19   Wieters, Hallie C,  PA-C  omeprazole (PRILOSEC) 40 MG capsule Take 1 capsule (40 mg total) by mouth daily. Patient not taking: Reported on 09/27/2019 09/12/19 10/29/19  Raylene Everts, MD    Family History Family History  Problem Relation Age of Onset  . Cancer Mother   . Heart failure Mother   . Hypertension Mother   . Stroke Mother   . Diabetes Mother   . Healthy Father   . Hypertension Maternal Grandmother   . Anesthesia problems Neg Hx     Social History Social History   Tobacco Use  . Smoking status: Former Smoker    Packs/day: 0.50    Years: 1.50    Pack years: 0.75    Types: Cigarettes    Quit date: 04/09/2015    Years since quitting: 4.6  . Smokeless tobacco: Never Used  Vaping Use  . Vaping Use: Never used  Substance Use Topics  . Alcohol use: No    Comment: hx drug use  . Drug use: No    Types: Marijuana, Cocaine    Comment: past use, 4 years ago, edibles 03/2019     Allergies   Peanut-containing drug products, Amoxicillin, and Latex   Review of Systems Review of Systems  Constitutional: Negative for chills and fever.  HENT: Negative for ear pain and sore throat.   Eyes: Negative for pain and visual disturbance.  Respiratory: Negative for cough and shortness of breath.   Cardiovascular: Negative for chest pain and palpitations.  Gastrointestinal: Negative for abdominal pain and vomiting.  Genitourinary: Negative for dysuria and hematuria.  Musculoskeletal: Positive for arthralgias. Negative for back pain.  Skin: Positive for color change. Negative for rash.  Neurological: Negative for seizures and syncope.  All other systems reviewed and are negative.    Physical Exam Triage Vital Signs ED Triage Vitals  Enc Vitals Group     BP 12/18/19 1346 134/90     Pulse Rate 12/18/19 1346 (!) 101     Resp 12/18/19 1346 18     Temp 12/18/19 1346 99.3 F (37.4 C)     Temp Source 12/18/19 1346 Oral     SpO2 12/18/19 1346 97 %     Weight --      Height --      Head  Circumference --      Peak Flow --      Pain Score 12/18/19 1349 10     Pain Loc --      Pain Edu? --      Excl. in [place]? --    No data found.  Updated Vital Signs BP 134/90   Pulse Marland Kitchen)  101   Temp 99.3 F (37.4 C) (Oral)   Resp 18   LMP 12/08/2019 (Approximate)   SpO2 97%   Visual Acuity Right Eye Distance:   Left Eye Distance:   Bilateral Distance:    Right Eye Near:   Left Eye Near:    Bilateral Near:     Physical Exam Vitals and nursing note reviewed.  Constitutional:      General: She is not in acute distress.    Appearance: She is well-developed. She is ill-appearing.  HENT:     Head: Normocephalic and atraumatic.     Mouth/Throat:     Mouth: Mucous membranes are moist.  Eyes:     Conjunctiva/sclera: Conjunctivae normal.  Cardiovascular:     Rate and Rhythm: Normal rate and regular rhythm.     Heart sounds: No murmur heard.   Pulmonary:     Effort: Pulmonary effort is normal. No respiratory distress.     Breath sounds: Normal breath sounds.  Abdominal:     Palpations: Abdomen is soft.     Tenderness: There is no abdominal tenderness.  Musculoskeletal:        General: Swelling and tenderness present. No deformity.     Cervical back: Neck supple.  Skin:    General: Skin is warm and dry.     Capillary Refill: Capillary refill takes less than 2 seconds.     Findings: Erythema present. No bruising or lesion.     Comments: Right hand acutely tender, erythematous, and edematous; erythema extending up to forearm.  Neurological:     General: No focal deficit present.     Mental Status: She is alert and oriented to person, place, and time.     Sensory: No sensory deficit.     Motor: Weakness present.     Gait: Gait normal.  Psychiatric:        Mood and Affect: Mood normal.        Behavior: Behavior normal.      UC Treatments / Results  Labs (all labs ordered are listed, but only abnormal results are displayed) Labs Reviewed - No data to  display  EKG   Radiology No results found.  Procedures Procedures (including critical care time)  Medications Ordered in UC Medications - No data to display  Initial Impression / Assessment and Plan / UC Course  I have reviewed the triage vital signs and the nursing notes.  Pertinent labs & imaging results that were available during my care of the patient were reviewed by me and considered in my medical decision making (see chart for details).   Cellulitis of right hand and forearm.  Sending patient to the ED for evaluation.  Patient agrees to plan of care.   Final Clinical Impressions(s) / UC Diagnoses   Final diagnoses:  Cellulitis of right arm     Discharge Instructions     Go directly to the Emergency Department for evaluation of your arm swelling and redness.         ED Prescriptions    None     PDMP not reviewed this encounter.   Sharion Balloon, NP 12/18/19 1406

## 2020-01-17 ENCOUNTER — Ambulatory Visit (HOSPITAL_COMMUNITY)
Admission: EM | Admit: 2020-01-17 | Discharge: 2020-01-17 | Discharge status: Against Medical Advice | Payer: Medicaid Other

## 2020-02-03 ENCOUNTER — Encounter (HOSPITAL_COMMUNITY): Payer: Self-pay | Admitting: Emergency Medicine

## 2020-02-03 ENCOUNTER — Ambulatory Visit (HOSPITAL_COMMUNITY)
Admission: EM | Admit: 2020-02-03 | Discharge: 2020-02-03 | Disposition: A | Discharge status: Home / Self Care | Payer: Medicaid Other | Attending: Family Medicine | Admitting: Family Medicine

## 2020-02-03 ENCOUNTER — Other Ambulatory Visit: Payer: Self-pay

## 2020-02-03 DIAGNOSIS — Z3202 Encounter for pregnancy test, result negative: Secondary | ICD-10-CM | POA: Diagnosis not present

## 2020-02-03 DIAGNOSIS — R109 Unspecified abdominal pain: Secondary | ICD-10-CM

## 2020-02-03 DIAGNOSIS — Z202 Contact with and (suspected) exposure to infections with a predominantly sexual mode of transmission: Secondary | ICD-10-CM | POA: Diagnosis not present

## 2020-02-03 DIAGNOSIS — R1032 Left lower quadrant pain: Secondary | ICD-10-CM | POA: Diagnosis not present

## 2020-02-03 DIAGNOSIS — N760 Acute vaginitis: Secondary | ICD-10-CM | POA: Insufficient documentation

## 2020-02-03 LAB — POCT URINALYSIS DIPSTICK, ED / UC
Glucose, UA: NEGATIVE mg/dL
Hgb urine dipstick: NEGATIVE
Leukocytes,Ua: NEGATIVE
Nitrite: NEGATIVE
Protein, ur: NEGATIVE mg/dL
Specific Gravity, Urine: >=1.03 (ref 1.005–1.030)
Urobilinogen, UA: 1 mg/dL (ref 0.0–1.0)
pH: 6 (ref 5.0–8.0)

## 2020-02-03 LAB — POC URINE PREG, ED: Preg Test, Ur: NEGATIVE

## 2020-02-03 MED ORDER — KETOROLAC TROMETHAMINE 60 MG/2ML IM SOLN
INTRAMUSCULAR | Status: AC
  Filled 2020-02-03: qty 2

## 2020-02-03 MED ORDER — KETOROLAC TROMETHAMINE 60 MG/2ML IM SOLN
60.0000 mg | Freq: Once | INTRAMUSCULAR | Status: AC
  Administered 2020-02-03: 60 mg via INTRAMUSCULAR

## 2020-02-03 MED ORDER — TRIAMCINOLONE ACETONIDE 0.1 % EX CREA: 1.0000 "application " | TOPICAL_CREAM | Freq: Two times a day (BID) | CUTANEOUS | 0 refills | Status: DC

## 2020-02-03 NOTE — ED Provider Notes (Signed)
[facility]    CSN: [id number] Arrival date & time: 02/03/20  1814      History   Chief Complaint Chief Complaint  Patient presents with  . SEXUALLY TRANSMITTED DISEASE  . Possible Pregnancy  . Abdominal Pain    HPI [name] is a 39 y.o. female.   Patient presenting today with 1 day history of thin vaginal discharge and irritation, pelvic pain worst on the left, nausea and vomiting, fatigue. Denies dysuria, hematuria, diarrhea, rashes, sick contacts, new foods or medications. Not trying anything OTC for sxs.      Past Medical History:  Diagnosis Date  . Anemia   . Bipolar 1 disorder ([place])   . Chlamydia   . Chronic abdominal pain   . Chronic nausea   . Deliberate self-cutting   . History of substance abuse ([place])   . HSV-2 infection 2015  . Hypertension   . Infection    MRSA in 1995, negative since  . Lupus ([place])   . Schizophrenia ([place])   . Seizures ([place])    Not recently  . Sickle cell trait ([place])   . Trichomonas infection     Patient Active Problem List   Diagnosis Date Noted  . Abdominal pain 12/02/2017  . Back pain affecting pregnancy in third trimester 03/20/2017  . Labor, premature with delivery 03/20/2017  . Preterm labor in third trimester 03/03/2017  . Chronic hypertension in pregnancy 02/21/2017  . GERD (gastroesophageal reflux disease) 02/21/2017  . Umbilical hernia 27/09/2374  . Hereditary disease in family possibly affecting fetus, fetus 60   . Penicillin allergy 10/02/2016  . Opiate use 09/25/2016  . Supervision of high risk pregnancy, antepartum 09/19/2016  . History of herpes genitalis 09/19/2016  . History of trichomoniasis 09/19/2016  . Advanced maternal age in multigravida, second trimester 09/19/2016  . [place] multiparity 09/19/2016  . Short interval between pregnancies affecting pregnancy in first trimester, antepartum 09/19/2016  . Dichorionic diamniotic twin gestation 09/19/2016  . Chronic hypertension 07/03/2016    . Tobacco abuse 12/19/2014  . Bipolar disorder ([place]) 12/19/2014  . History of substance abuse ([place]) 12/19/2014  . Sickle cell trait ([place]) 12/19/2014    Past Surgical History:  Procedure Laterality Date  . CESAREAN SECTION N/A 03/21/2017   Procedure: CESAREAN SECTION;  Surgeon: Aletha Halim, MD;  Location: [facility];  Service: Obstetrics;  Laterality: N/A;  . DILATION AND CURETTAGE OF UTERUS    . HEMORROIDECTOMY  2010  . HERNIA REPAIR    . plastic surgery on face    . UMBILICAL HERNIA REPAIR N/A 12/02/2017   Procedure: LAPAROSCOPIC EXCISION OF MESH ERAS PATHWAY;  Surgeon: Kinsinger, Arta Bruce, MD;  Location: [facility];  Service: General;  Laterality: N/A;    OB History    Gravida  15   Para  9   Term  8   Preterm  1   AB  6   Living  10     SAB  6   TAB  0   Ectopic  0   Multiple  1   Live Births  10            Home Medications    Prior to Admission medications   Medication Sig Start Date End Date Taking? Authorizing Provider  escitalopram (LEXAPRO) 10 MG tablet Take 10 mg by mouth daily.  08/22/18  Yes [provider]  lamoTRIgine (LAMICTAL) 25 MG tablet Take 25 mg by mouth 2 (two) times daily. 09/14/19  [provider]  naproxen (NAPROSYN) 500 MG tablet Take 1 tablet (500 mg total) by mouth 2 (two) times daily. 10/29/19   Wieters, Hallie C, PA-C  NARCAN 4 MG/0.1ML LIQD nasal spray kit Place 1 spray into the nose once.  09/10/18   [provider]  QUEtiapine (SEROQUEL) 100 MG tablet Take 100 mg by mouth at bedtime. 09/14/19   [provider]  SUBOXONE 8-2 MG FILM Take 1 Film by mouth in the morning, at noon, and at bedtime.  10/16/18   [provider]  triamcinolone cream (KENALOG) 0.1 % Apply 1 application topically 2 (two) times daily. 02/03/20   Volney American, PA-C  omeprazole (PRILOSEC) 40 MG capsule Take 1 capsule (40 mg total) by mouth daily. Patient not taking: Reported on 09/27/2019 09/12/19  10/29/19  Raylene Everts, MD    Family History Family History  Problem Relation Age of Onset  . Cancer Mother   . Heart failure Mother   . Hypertension Mother   . Stroke Mother   . Diabetes Mother   . Healthy Father   . Hypertension Maternal Grandmother   . Anesthesia problems Neg Hx     Social History Social History   Tobacco Use  . Smoking status: Former Smoker    Packs/day: 0.50    Years: 1.50    Pack years: 0.75    Types: Cigarettes    Quit date: 04/09/2015    Years since quitting: 4.8  . Smokeless tobacco: Never Used  Vaping Use  . Vaping Use: Never used  Substance Use Topics  . Alcohol use: No    Comment: hx drug use  . Drug use: No    Types: Marijuana, Cocaine    Comment: past use, 4 years ago, edibles 03/2019     Allergies   Peanut-containing drug products, Amoxicillin, and Latex   Review of Systems Review of Systems PER HPI    Physical Exam Triage Vital Signs ED Triage Vitals  Enc Vitals Group     BP 02/03/20 1852 135/73     Pulse Rate 02/03/20 1852 79     Resp 02/03/20 1852 20     Temp 02/03/20 1852 99 F (37.2 C)     Temp Source 02/03/20 1852 Oral     SpO2 02/03/20 1852 100 %     Weight --      Height --      Head Circumference --      Peak Flow --      Pain Score 02/03/20 1846 10     Pain Loc --      Pain Edu? --      Excl. in [place]? --    No data found.  Updated Vital Signs BP 135/73 (BP Location: Left Arm)   Pulse 79   Temp 99 F (37.2 C) (Oral)   Resp 20   LMP 01/17/2020   SpO2 100%   Visual Acuity Right Eye Distance:   Left Eye Distance:   Bilateral Distance:    Right Eye Near:   Left Eye Near:    Bilateral Near:     Physical Exam Vitals and nursing note reviewed.  Constitutional:      Appearance: Normal appearance. She is not ill-appearing.  HENT:     Head: Atraumatic.  Eyes:     Extraocular Movements: Extraocular movements intact.     Conjunctiva/sclera: Conjunctivae normal.  Cardiovascular:     Rate  and Rhythm: Normal rate and regular rhythm.  Heart sounds: Normal heart sounds.  Pulmonary:     Effort: Pulmonary effort is normal.     Breath sounds: Normal breath sounds.  Musculoskeletal:        General: Normal range of motion.     Cervical back: Normal range of motion and neck supple.  Skin:    General: Skin is warm and dry.  Neurological:     Mental Status: She is alert and oriented to person, place, and time.  Psychiatric:        Mood and Affect: Mood normal.        Thought Content: Thought content normal.        Judgment: Judgment normal.      UC Treatments / Results  Labs (all labs ordered are listed, but only abnormal results are displayed) Labs Reviewed  POCT URINALYSIS DIPSTICK, ED / UC - Abnormal; Notable for the following components:      Result Value   Bilirubin Urine SMALL (*)    Ketones, ur TRACE (*)    All other components within normal limits  POC URINE PREG, ED  CERVICOVAGINAL ANCILLARY ONLY    EKG   Radiology No results found.  Procedures Procedures (including critical care time)  Medications Ordered in UC Medications  ketorolac (TORADOL) injection 60 mg (60 mg Intramuscular Given 02/03/20 1931)    Initial Impression / Assessment and Plan / UC Course  I have reviewed the triage vital signs and the nursing notes.  Pertinent labs & imaging results that were available during my care of the patient were reviewed by me and considered in my medical decision making (see chart for details).     U/A without evidence of infection, urine preg neg, aptima swab pending. Vitals reassuring, exam showing tenderness left side of abdomen worst in lower quadrant. Will give IM toradol for pain today, await aptima swab, discussed ER precautions for worsening sxs.  Requesting kenalog cream refill, refill sent  Final Clinical Impressions(s) / UC Diagnoses   Final diagnoses:  Left lower quadrant abdominal pain  Acute vaginitis   Discharge Instructions     None    ED Prescriptions    Medication Sig Dispense Auth. Provider   triamcinolone cream (KENALOG) 0.1 % Apply 1 application topically 2 (two) times daily. 80 g Volney American, [place]     PDMP not reviewed this encounter.   Volney American, [place] 02/03/20 [phone]

## 2020-02-03 NOTE — ED Triage Notes (Signed)
Pt c/o abd pain LLQ and peri umbilical and anal and vaginal itching onset yesterday. Pt states she is concerned for an STD or yeast infection but would also liked to be checked for pregnancy.

## 2020-02-04 LAB — CERVICOVAGINAL ANCILLARY ONLY
Bacterial Vaginitis (gardnerella): NEGATIVE
Candida Glabrata: NEGATIVE
Candida Vaginitis: NEGATIVE
Chlamydia: NEGATIVE
Comment: NEGATIVE
Comment: NEGATIVE
Comment: NEGATIVE
Comment: NEGATIVE
Comment: NEGATIVE
Comment: NORMAL
Neisseria Gonorrhea: NEGATIVE
Trichomonas: NEGATIVE

## 2020-06-05 ENCOUNTER — Ambulatory Visit: Payer: Medicaid Other | Admitting: Family Medicine

## 2020-07-13 ENCOUNTER — Other Ambulatory Visit: Payer: Self-pay

## 2020-07-13 ENCOUNTER — Ambulatory Visit (HOSPITAL_COMMUNITY)
Admission: EM | Admit: 2020-07-13 | Discharge: 2020-07-13 | Disposition: A | Discharge status: Home / Self Care | Payer: Medicaid Other | Attending: Urgent Care | Admitting: Urgent Care

## 2020-07-13 ENCOUNTER — Encounter (HOSPITAL_COMMUNITY): Payer: Self-pay

## 2020-07-13 DIAGNOSIS — R102 Pelvic and perineal pain: Secondary | ICD-10-CM | POA: Diagnosis present

## 2020-07-13 DIAGNOSIS — Z3202 Encounter for pregnancy test, result negative: Secondary | ICD-10-CM

## 2020-07-13 DIAGNOSIS — Z7251 High risk heterosexual behavior: Secondary | ICD-10-CM | POA: Diagnosis present

## 2020-07-13 DIAGNOSIS — R590 Localized enlarged lymph nodes: Secondary | ICD-10-CM | POA: Diagnosis present

## 2020-07-13 DIAGNOSIS — J3089 Other allergic rhinitis: Secondary | ICD-10-CM | POA: Insufficient documentation

## 2020-07-13 DIAGNOSIS — N898 Other specified noninflammatory disorders of vagina: Secondary | ICD-10-CM | POA: Diagnosis present

## 2020-07-13 DIAGNOSIS — Z202 Contact with and (suspected) exposure to infections with a predominantly sexual mode of transmission: Secondary | ICD-10-CM | POA: Diagnosis present

## 2020-07-13 DIAGNOSIS — Z8742 Personal history of other diseases of the female genital tract: Secondary | ICD-10-CM | POA: Insufficient documentation

## 2020-07-13 DIAGNOSIS — R070 Pain in throat: Secondary | ICD-10-CM | POA: Insufficient documentation

## 2020-07-13 LAB — POC URINE PREG, ED: Preg Test, Ur: NEGATIVE

## 2020-07-13 LAB — HIV ANTIBODY (ROUTINE TESTING W REFLEX): HIV Screen 4th Generation wRfx: NONREACTIVE

## 2020-07-13 MED ORDER — LIDOCAINE HCL (PF) 1 % IJ SOLN
INTRAMUSCULAR | Status: AC
  Filled 2020-07-13: qty 30

## 2020-07-13 MED ORDER — METRONIDAZOLE 500 MG PO TABS: 500.0000 mg | ORAL_TABLET | Freq: Two times a day (BID) | ORAL | 0 refills | Status: DC

## 2020-07-13 MED ORDER — CETIRIZINE HCL 10 MG PO TABS: 10.0000 mg | ORAL_TABLET | Freq: Every day | ORAL | 0 refills | Status: DC

## 2020-07-13 MED ORDER — CEFTRIAXONE SODIUM 500 MG IJ SOLR
500.0000 mg | Freq: Once | INTRAMUSCULAR | Status: AC
  Administered 2020-07-13: 500 mg via INTRAMUSCULAR

## 2020-07-13 MED ORDER — FLUTICASONE PROPIONATE 50 MCG/ACT NA SUSP: 2.0000 | Freq: Every day | NASAL | 0 refills | Status: DC

## 2020-07-13 NOTE — ED Triage Notes (Signed)
Pt presents with loss of voice, swollen & painful lymph nodes in neck, vaginal discharge, fishy vaginal odor, and vaginal pain & itching for past few days.  Pt states he partner was sexually exposed to someone else that was treated for gonorrhea.

## 2020-07-13 NOTE — Discharge Instructions (Signed)
Today, we have given you an injection of ceftriaxone to cover for your exposure to gonorrhea.  I offered you a course of fluconazole to help with a yeast infection but she refused.  He requested a course of metronidazole to help cover for bacterial vaginitis and I sent the prescription to your pharmacy electronically.  We will let you know what your results are over the next 24 to 48 hours. Avoid all forms of sexual intercourse (oral, vaginal, anal) for the next 7 days to avoid spreading/reinfecting. Return if symptoms worsen/do not resolve, you develop fever, abdominal pain, blood in your urine, or are re-exposed to an STI.  Please make sure you follow-up with your regular doctor regarding your allergic rhinitis.  In the meantime, start taking Zyrtec and Flonase daily.

## 2020-07-13 NOTE — ED Provider Notes (Signed)
[facility]   MRN: [id number] DOB: [birth year]  Subjective:   [name] is a 40 y.o. female presenting for 4-day history of acute onset moderate throat pain, left arm pain, hoarseness, vaginal discharge, malodorous discharge, pelvic pain.  Patient states that she had sex with a new partner, did not use condoms, had oral and vaginal sex.  States that he tested positive for gonorrhea.  She also reports a history of BV and would like treatment for this.  States that she has a PCP and regularly gets azithromycin and prednisone whenever she has trouble with her allergies and is requesting this today as well.  No current facility-administered medications for this encounter.  Current Outpatient Medications:  .  escitalopram (LEXAPRO) 10 MG tablet, Take 10 mg by mouth daily. , Disp: , Rfl:  .  lamoTRIgine (LAMICTAL) 25 MG tablet, Take 25 mg by mouth 2 (two) times daily., Disp: , Rfl:  .  naproxen (NAPROSYN) 500 MG tablet, Take 1 tablet (500 mg total) by mouth 2 (two) times daily., Disp: 30 tablet, Rfl: 0 .  NARCAN 4 MG/0.1ML LIQD nasal spray kit, Place 1 spray into the nose once. , Disp: , Rfl:  .  QUEtiapine (SEROQUEL) 100 MG tablet, Take 100 mg by mouth at bedtime., Disp: , Rfl:  .  SUBOXONE 8-2 MG FILM, Take 1 Film by mouth in the morning, at noon, and at bedtime. , Disp: , Rfl:  .  triamcinolone cream (KENALOG) 0.1 %, Apply 1 application topically 2 (two) times daily., Disp: 80 g, Rfl: 0   Allergies  Allergen Reactions  . Peanut-Containing Drug Products Anaphylaxis, Itching and Rash  . Amoxicillin Hives, Swelling and Other (See Comments)    Has patient had a PCN reaction causing immediate rash, facial/tongue/throat swelling, SOB or lightheadedness with hypotension: No Has patient had a PCN reaction causing severe rash involving mucus membranes or skin necrosis: No Has patient had a PCN reaction that required hospitalization No Has patient had a PCN reaction occurring  within the last 10 years: Yes If all of the above answers are "NO", then may proceed with Cephalosporin use.  . Latex Hives and Itching    Past Medical History:  Diagnosis Date  . Anemia   . Bipolar 1 disorder ([place])   . Chlamydia   . Chronic abdominal pain   . Chronic nausea   . Deliberate self-cutting   . History of substance abuse ([place])   . HSV-2 infection 2015  . Hypertension   . Infection    MRSA in 1995, negative since  . Lupus ([place])   . Schizophrenia ([place])   . Seizures ([place])    Not recently  . Sickle cell trait ([place])   . Trichomonas infection      Past Surgical History:  Procedure Laterality Date  . CESAREAN SECTION N/A 03/21/2017   Procedure: CESAREAN SECTION;  Surgeon: Aletha Halim, MD;  Location: [facility];  Service: Obstetrics;  Laterality: N/A;  . DILATION AND CURETTAGE OF UTERUS    . HEMORROIDECTOMY  2010  . HERNIA REPAIR    . plastic surgery on face    . UMBILICAL HERNIA REPAIR N/A 12/02/2017   Procedure: LAPAROSCOPIC EXCISION OF MESH ERAS PATHWAY;  Surgeon: Kinsinger, Arta Bruce, MD;  Location: [facility];  Service: General;  Laterality: N/A;    Family History  Problem Relation Age of Onset  . Cancer Mother   . Heart failure Mother   . Hypertension Mother   .  Stroke Mother   . Diabetes Mother   . Healthy Father   . Hypertension Maternal Grandmother   . Anesthesia problems Neg Hx     Social History   Tobacco Use  . Smoking status: Former Smoker    Packs/day: 0.50    Years: 1.50    Pack years: 0.75    Types: Cigarettes    Quit date: 04/09/2015    Years since quitting: 5.2  . Smokeless tobacco: Never Used  Vaping Use  . Vaping Use: Never used  Substance Use Topics  . Alcohol use: No    Comment: hx drug use  . Drug use: No    Types: Marijuana, Cocaine    Comment: past use, 4 years ago, edibles 03/2019    ROS   Objective:   Vitals: BP (!) 150/82 (BP Location: Left Arm)   Pulse 60   Temp 98.6 F (37 C) (Oral)   Resp 17    SpO2 97%   Physical Exam Constitutional:      General: She is not in acute distress.    Appearance: Normal appearance. She is well-developed. She is not ill-appearing, toxic-appearing or diaphoretic.  HENT:     Head: Normocephalic and atraumatic.     Nose: Nose normal.     Mouth/Throat:     Mouth: Mucous membranes are moist.     Pharynx: No oropharyngeal exudate or posterior oropharyngeal erythema.     Comments: Postnasal drainage overlying pharynx.  Hoarseness. Eyes:     General: No scleral icterus.       Right eye: No discharge.        Left eye: No discharge.     Extraocular Movements: Extraocular movements intact.     Conjunctiva/sclera: Conjunctivae normal.     Pupils: Pupils are equal, round, and reactive to light.  Cardiovascular:     Rate and Rhythm: Normal rate and regular rhythm.     Pulses: Normal pulses.     Heart sounds: Normal heart sounds. No murmur heard. No friction rub. No gallop.   Pulmonary:     Effort: Pulmonary effort is normal. No respiratory distress.     Breath sounds: Normal breath sounds. No stridor. No wheezing, rhonchi or rales.  Abdominal:     General: Bowel sounds are normal. There is no distension.     Palpations: Abdomen is soft. There is no mass.     Tenderness: There is no abdominal tenderness. There is no guarding or rebound.  Lymphadenopathy:     Cervical: Cervical adenopathy (Anterior, bilateral) present.  Skin:    General: Skin is warm and dry.     Findings: No rash.  Neurological:     Mental Status: She is alert and oriented to person, place, and time.  Psychiatric:        Mood and Affect: Mood normal.        Behavior: Behavior normal.        Thought Content: Thought content normal.        Judgment: Judgment normal.      Results for orders placed or performed during the hospital encounter of 07/13/20 (from the past 24 hour(s))  POC urine pregnancy     Status: None   Collection Time: 07/13/20  3:53 PM  Result Value Ref Range    Preg Test, Ur NEGATIVE NEGATIVE    Assessment and Plan :   PDMP not reviewed this encounter.  1. Vaginal discharge   2. Exposure to gonorrhea   3. Pelvic pain  4. History of vaginitis   5. Unprotected sex   66. Cervical lymphadenopathy   7. Throat pain   8. Allergic rhinitis due to other allergic trigger, unspecified seasonality    Given patient's exposure to gonorrhea, will use IM ceftriaxone.  Offered patient fluconazole as she has a history of yeast infections and has vaginal itching.  Patient refused this.  She requested metronidazole for history of BV, send a prescription for this to her pharmacy.  Counseled on need to abstain for 7 days.  Recommended Flonase, Zyrtec for her allergic rhinitis.  I refused to fill a prednisone course, azithromycin for her allergies.  Recommend she follow-up with her PCP.  Labs pending, will notify her if she needs further treatment. Counseled patient on potential for adverse effects with medications prescribed/recommended today, ER and return-to-clinic precautions discussed, patient verbalized understanding.    Jaynee Eagles, [place] 07/13/20 [id number]

## 2020-07-14 LAB — CERVICOVAGINAL ANCILLARY ONLY
Bacterial Vaginitis (gardnerella): POSITIVE — AB
Candida Glabrata: NEGATIVE
Candida Vaginitis: NEGATIVE
Chlamydia: NEGATIVE
Comment: NEGATIVE
Comment: NEGATIVE
Comment: NEGATIVE
Comment: NEGATIVE
Comment: NEGATIVE
Comment: NORMAL
Neisseria Gonorrhea: NEGATIVE
Trichomonas: NEGATIVE

## 2020-07-14 LAB — RPR: RPR Ser Ql: NONREACTIVE

## 2020-07-14 LAB — CYTOLOGY, (ORAL, ANAL, URETHRAL) ANCILLARY ONLY
Chlamydia: NEGATIVE
Comment: NEGATIVE
Comment: NEGATIVE
Comment: NORMAL
Neisseria Gonorrhea: NEGATIVE
Trichomonas: NEGATIVE

## 2020-07-28 ENCOUNTER — Other Ambulatory Visit: Payer: Self-pay

## 2020-08-16 ENCOUNTER — Other Ambulatory Visit: Payer: Self-pay | Admitting: Family Medicine

## 2020-10-11 ENCOUNTER — Other Ambulatory Visit: Payer: Self-pay

## 2020-10-11 ENCOUNTER — Ambulatory Visit (HOSPITAL_COMMUNITY)
Admission: EM | Admit: 2020-10-11 | Discharge: 2020-10-11 | Disposition: A | Discharge status: Home / Self Care | Payer: Medicaid Other | Attending: Student | Admitting: Student

## 2020-10-11 ENCOUNTER — Encounter (HOSPITAL_COMMUNITY): Payer: Self-pay | Admitting: *Deleted

## 2020-10-11 DIAGNOSIS — Z9889 Other specified postprocedural states: Secondary | ICD-10-CM

## 2020-10-11 DIAGNOSIS — Z8719 Personal history of other diseases of the digestive system: Secondary | ICD-10-CM

## 2020-10-11 DIAGNOSIS — K429 Umbilical hernia without obstruction or gangrene: Secondary | ICD-10-CM

## 2020-10-11 LAB — POCT URINALYSIS DIPSTICK, ED / UC
Bilirubin Urine: NEGATIVE
Glucose, UA: NEGATIVE mg/dL
Hgb urine dipstick: NEGATIVE
Ketones, ur: NEGATIVE mg/dL
Leukocytes,Ua: NEGATIVE
Nitrite: NEGATIVE
Protein, ur: NEGATIVE mg/dL
Specific Gravity, Urine: >=1.03 (ref 1.005–1.030)
Urobilinogen, UA: 1 mg/dL (ref 0.0–1.0)
pH: 6 (ref 5.0–8.0)

## 2020-10-11 LAB — POC URINE PREG, ED: Preg Test, Ur: NEGATIVE

## 2020-10-11 NOTE — Discharge Instructions (Addendum)
-  Please head straight to [facility] or [facility] emergency department for further evaluation and management of your hernia.  I am concerned that you have an obstructed hernia, which is where a loop of bowel get stuck in the hernia.  This can be deadly as it can cause death of the tissue and further issues like infection and perforation.  The emergency department will also address your vaginal discharge.  If you develop new symptoms on the way, like dizziness, weakness-stop and call 911.

## 2020-10-11 NOTE — ED Provider Notes (Signed)
[facility]    CSN: [id number] Arrival date & time: 10/11/20  1135      History   Chief Complaint Chief Complaint  Patient presents with   ABD hernia   Vaginal Discharge    HPI MEI SUITS is a 40 y.o. female presenting with concern for hernia, as well as vaginal discharge.  Medical history chlamydia, trichomonas, chronic nausea, chronic abdominal pain, hypertension, bipolar 1, substance abuse, schizophrenia, sickle cell trait.  History of umbilical hernia status postrepair.  This has been repaired multiple times, most recently 2019.  Patient endorses 3 days of umbilical hernia, with visible bulge and 10/10 pain.  Patient endorses some constipation and nausea, though she did have a normal bowel movement this morning and is still passing gas.  Has not vomited today.  Denies blood in vomit or stool, denies dark or tarry stool.  She is also concerned about her vaginal discharge, though we did not address this given acute abdominal symptoms today.  HPI  Past Medical History:  Diagnosis Date   Anemia    Bipolar 1 disorder (HCC)    Chlamydia    Chronic abdominal pain    Chronic nausea    Deliberate self-cutting    History of substance abuse ([place])    HSV-2 infection 2015   Hypertension    Infection    MRSA in 1995, negative since   Lupus ([place])    Schizophrenia ([place])    Seizures ([place])    Not recently   Sickle cell trait ([place])    Trichomonas infection     Patient Active Problem List   Diagnosis Date Noted   Abdominal pain 12/02/2017   Back pain affecting pregnancy in third trimester 03/20/2017   Labor, premature with delivery 03/20/2017   Preterm labor in third trimester 03/03/2017   Chronic hypertension in pregnancy 02/21/2017   GERD (gastroesophageal reflux disease) 21/02/7355   Umbilical hernia 70/14/1030   Hereditary disease in family possibly affecting fetus, fetus 1    Penicillin allergy 10/02/2016   Opiate use 09/25/2016   Supervision of high risk  pregnancy, antepartum 09/19/2016   History of herpes genitalis 09/19/2016   History of trichomoniasis 09/19/2016   Advanced maternal age in multigravida, second trimester 09/19/2016   Grand multiparity 09/19/2016   Short interval between pregnancies affecting pregnancy in first trimester, antepartum 09/19/2016   Dichorionic diamniotic twin gestation 09/19/2016   Chronic hypertension 07/03/2016   Tobacco abuse 12/19/2014   Bipolar disorder ([place]) 12/19/2014   History of substance abuse ([place]) 12/19/2014   Sickle cell trait ([place]) 12/19/2014    Past Surgical History:  Procedure Laterality Date   CESAREAN SECTION N/A 03/21/2017   Procedure: CESAREAN SECTION;  Surgeon: Aletha Halim, MD;  Location: [facility];  Service: Obstetrics;  Laterality: N/A;   DILATION AND CURETTAGE OF UTERUS     HEMORROIDECTOMY  2010   HERNIA REPAIR     plastic surgery on face     UMBILICAL HERNIA REPAIR N/A 12/02/2017   Procedure: LAPAROSCOPIC EXCISION OF MESH ERAS PATHWAY;  Surgeon: Mickeal Skinner, MD;  Location: [facility];  Service: General;  Laterality: N/A;    OB History     Gravida  15   Para  9   Term  8   Preterm  1   AB  6   Living  10      SAB  6   IAB  0   Ectopic  0   Multiple  1  Live Births  10            Home Medications    Prior to Admission medications   Medication Sig Start Date End Date Taking? Authorizing Provider  cetirizine (ZYRTEC ALLERGY) 10 MG tablet Take 1 tablet (10 mg total) by mouth daily. 07/13/20   Jaynee Eagles, PA-C  escitalopram (LEXAPRO) 10 MG tablet Take 10 mg by mouth daily.  08/22/18   [provider]  fluticasone (FLONASE) 50 MCG/ACT nasal spray Place 2 sprays into both nostrils daily. 07/13/20   Jaynee Eagles, PA-C  lamoTRIgine (LAMICTAL) 25 MG tablet Take 25 mg by mouth 2 (two) times daily. 09/14/19   [provider]  metroNIDAZOLE (FLAGYL) 500 MG tablet Take 1 tablet (500 mg total) by mouth 2 (two) times daily with a  meal. DO NOT CONSUME ALCOHOL WHILE TAKING THIS MEDICATION. 07/13/20   Jaynee Eagles, PA-C  naproxen (NAPROSYN) 500 MG tablet Take 1 tablet (500 mg total) by mouth 2 (two) times daily. 10/29/19   Wieters, Hallie C, PA-C  NARCAN 4 MG/0.1ML LIQD nasal spray kit Place 1 spray into the nose once.  09/10/18   [provider]  QUEtiapine (SEROQUEL) 100 MG tablet Take 100 mg by mouth at bedtime. 09/14/19   [provider]  SUBOXONE 8-2 MG FILM Take 1 Film by mouth in the morning, at noon, and at bedtime.  10/16/18   [provider]  triamcinolone cream (KENALOG) 0.1 % Apply 1 application topically 2 (two) times daily. 02/03/20   Volney American, PA-C  omeprazole (PRILOSEC) 40 MG capsule Take 1 capsule (40 mg total) by mouth daily. Patient not taking: Reported on 09/27/2019 09/12/19 10/29/19  Raylene Everts, MD    Family History Family History  Problem Relation Age of Onset   Cancer Mother    Heart failure Mother    Hypertension Mother    Stroke Mother    Diabetes Mother    Healthy Father    Hypertension Maternal Grandmother    Anesthesia problems Neg Hx     Social History Social History   Tobacco Use   Smoking status: Former    Packs/day: 0.50    Years: 1.50    Pack years: 0.75    Types: Cigarettes    Quit date: 04/09/2015    Years since quitting: 5.5   Smokeless tobacco: Never  Vaping Use   Vaping Use: Never used  Substance Use Topics   Alcohol use: No    Comment: hx drug use   Drug use: No    Types: Marijuana, Cocaine    Comment: past use, 4 years ago, edibles 03/2019     Allergies   Peanut-containing drug products, Amoxicillin, and Latex   Review of Systems Review of Systems  Gastrointestinal:  Positive for abdominal distention, abdominal pain, constipation, nausea and vomiting. Negative for diarrhea.  All other systems reviewed and are negative.   Physical Exam Triage Vital Signs ED Triage Vitals  Enc Vitals Group     BP 10/11/20 1247 (!)  122/94     Pulse Rate 10/11/20 1247 70     Resp 10/11/20 1247 20     Temp 10/11/20 1247 99.2 F (37.3 C)     Temp src --      SpO2 10/11/20 1247 100 %     Weight --      Height --      Head Circumference --      Peak Flow --  Pain Score 10/11/20 1248 10     Pain Loc --      Pain Edu? --      Excl. in [place]? --    No data found.  Updated Vital Signs BP (!) 122/94   Pulse 70   Temp 99.2 F (37.3 C)   Resp 20   LMP 10/08/2020   SpO2 100%   Visual Acuity Right Eye Distance:   Left Eye Distance:   Bilateral Distance:    Right Eye Near:   Left Eye Near:    Bilateral Near:     Physical Exam Vitals reviewed.  Constitutional:      General: She is not in acute distress.    Appearance: Normal appearance. She is not ill-appearing.  HENT:     Head: Normocephalic and atraumatic.     Mouth/Throat:     Mouth: Mucous membranes are moist.     Comments: Moist mucous membranes Eyes:     Extraocular Movements: Extraocular movements intact.     Pupils: Pupils are equal, round, and reactive to light.  Cardiovascular:     Rate and Rhythm: Normal rate and regular rhythm.     Heart sounds: Normal heart sounds.  Pulmonary:     Effort: Pulmonary effort is normal.     Breath sounds: Normal breath sounds. No wheezing, rhonchi or rales.  Abdominal:     General: Bowel sounds are normal. There is distension.     Palpations: Abdomen is soft. There is no mass.     Tenderness: There is abdominal tenderness in the left lower quadrant. There is guarding. There is no right CVA tenderness, left CVA tenderness or rebound.     Hernia: A hernia is present. Hernia is present in the umbilical area.     Comments: Abdomen is significantly distended. Umbilicus with visible 3 x 4 cm hernia, exquisitely tender but reducible.  Diffuse abdominal pain to palpation, worse over umbilicus and left lower quadrant.  Patient uncomfortable throughout exam.  Bowel sounds are full throughout.  Skin:    General:  Skin is warm.     Capillary Refill: Capillary refill takes less than 2 seconds.     Comments: Good skin turgor  Neurological:     General: No focal deficit present.     Mental Status: She is alert and oriented to person, place, and time.  Psychiatric:        Mood and Affect: Mood normal.        Behavior: Behavior normal.     UC Treatments / Results  Labs (all labs ordered are listed, but only abnormal results are displayed) Labs Reviewed  POCT URINALYSIS DIPSTICK, ED / UC  POC URINE PREG, ED    EKG   Radiology No results found.  Procedures Procedures (including critical care time)  Medications Ordered in UC Medications - No data to display  Initial Impression / Assessment and Plan / UC Course  I have reviewed the triage vital signs and the nursing notes.  Pertinent labs & imaging results that were available during my care of the patient were reviewed by me and considered in my medical decision making (see chart for details).     This patient is a very pleasant 40 y.o. year old female presenting with umbilical hernia. On exam, abdomen is significantly distended with obvious protruding hernia and exquisite discomfort.  I am recommending this patient head straight to [facility] emergency department for further evaluation and management, including likely abdominal imaging.  I  do have concern for early obstruction based on symptoms.  Patient is also concerned about her vaginal discharge, which I will defer to emergency department provider.  She is hemodynamically stable for transport in personal vehicle at this time.   Final Clinical Impressions(s) / UC Diagnoses   Final diagnoses:  Umbilical hernia without obstruction and without gangrene  History of hernia repair     Discharge Instructions      -Please head straight to [facility] emergency department for further evaluation and management of your hernia.  I am concerned that you have an obstructed  hernia, which is where a loop of bowel get stuck in the hernia.  This can be deadly as it can cause death of the tissue and further issues like infection and perforation.  The emergency department will also address your vaginal discharge.  If you develop new symptoms on the way, like dizziness, weakness-stop and call 911.   ED Prescriptions   None    PDMP not reviewed this encounter.   Hazel Sams, PA-C 10/11/20 1409

## 2020-10-11 NOTE — ED Triage Notes (Signed)
Pt reports Vag DC 2-3 days ago. Lupus for 4 days and ABD hernia  3days

## 2020-12-04 ENCOUNTER — Encounter (HOSPITAL_BASED_OUTPATIENT_CLINIC_OR_DEPARTMENT_OTHER): Payer: Self-pay | Admitting: *Deleted

## 2020-12-04 ENCOUNTER — Other Ambulatory Visit: Payer: Self-pay

## 2020-12-04 DIAGNOSIS — K429 Umbilical hernia without obstruction or gangrene: Secondary | ICD-10-CM | POA: Insufficient documentation

## 2020-12-04 DIAGNOSIS — Z91040 Latex allergy status: Secondary | ICD-10-CM | POA: Insufficient documentation

## 2020-12-04 DIAGNOSIS — T192XXA Foreign body in vulva and vagina, initial encounter: Secondary | ICD-10-CM | POA: Insufficient documentation

## 2020-12-04 DIAGNOSIS — Z91010 Allergy to peanuts: Secondary | ICD-10-CM | POA: Diagnosis not present

## 2020-12-04 DIAGNOSIS — Z87891 Personal history of nicotine dependence: Secondary | ICD-10-CM | POA: Diagnosis not present

## 2020-12-04 DIAGNOSIS — I10 Essential (primary) hypertension: Secondary | ICD-10-CM | POA: Insufficient documentation

## 2020-12-04 DIAGNOSIS — X58XXXA Exposure to other specified factors, initial encounter: Secondary | ICD-10-CM | POA: Insufficient documentation

## 2020-12-04 NOTE — ED Triage Notes (Signed)
C/o condom in vaginal x 12 hrs , also c/o " hernia pain "

## 2020-12-05 ENCOUNTER — Emergency Department (HOSPITAL_BASED_OUTPATIENT_CLINIC_OR_DEPARTMENT_OTHER)
Admission: EM | Admit: 2020-12-05 | Discharge: 2020-12-05 | Disposition: A | Discharge status: Home / Self Care | Payer: Medicaid Other | Attending: Emergency Medicine | Admitting: Emergency Medicine

## 2020-12-05 DIAGNOSIS — T192XXA Foreign body in vulva and vagina, initial encounter: Secondary | ICD-10-CM

## 2020-12-05 DIAGNOSIS — K429 Umbilical hernia without obstruction or gangrene: Secondary | ICD-10-CM

## 2020-12-05 LAB — URINALYSIS, ROUTINE W REFLEX MICROSCOPIC
Bilirubin Urine: NEGATIVE
Glucose, UA: NEGATIVE mg/dL
Hgb urine dipstick: NEGATIVE
Ketones, ur: NEGATIVE mg/dL
Leukocytes,Ua: NEGATIVE
Nitrite: NEGATIVE
Protein, ur: NEGATIVE mg/dL
Specific Gravity, Urine: 1.025 (ref 1.005–1.030)
pH: 6 (ref 5.0–8.0)

## 2020-12-05 LAB — HIV ANTIBODY (ROUTINE TESTING W REFLEX): HIV Screen 4th Generation wRfx: NONREACTIVE

## 2020-12-05 LAB — WET PREP, GENITAL
Clue Cells Wet Prep HPF POC: NONE SEEN
Sperm: NONE SEEN
Trich, Wet Prep: NONE SEEN
Yeast Wet Prep HPF POC: NONE SEEN

## 2020-12-05 LAB — PREGNANCY, URINE: Preg Test, Ur: NEGATIVE

## 2020-12-05 MED ORDER — ACETAMINOPHEN 325 MG PO TABS: 650.0000 mg | ORAL_TABLET | Freq: Four times a day (QID) | ORAL | 0 refills | Status: DC | PRN

## 2020-12-05 MED ORDER — ACETAMINOPHEN 325 MG PO TABS
650.0000 mg | ORAL_TABLET | Freq: Once | ORAL | Status: AC
  Administered 2020-12-05: 650 mg via ORAL
  Filled 2020-12-05: qty 2

## 2020-12-05 NOTE — ED Provider Notes (Signed)
[facility] EMERGENCY DEPARTMENT Provider Note   CSN: [id number] Arrival date & time: 12/04/20  2242     History Chief Complaint  Patient presents with   Foreign Body in Vagina    [name] is a 40 y.o. female.  The history is provided by the patient.  Foreign Body in Vagina She has history of bipolar disorder, hypertension, schizophrenia and comes in with 2 complaints.  She has a condom in her vagina which she has not been able to remove.  It has been there for approximately 24 hours.  She is complaining of some discharge and some mild pelvic pain.  She also has an umbilical hernia and states that she is having severe pain from her hernia and wants to be referred for surgical management.  She denies any other contraception besides condoms.   Past Medical History:  Diagnosis Date   Anemia    Bipolar 1 disorder (HCC)    Chlamydia    Chronic abdominal pain    Chronic nausea    Deliberate self-cutting    History of substance abuse ([place])    HSV-2 infection 2015   Hypertension    Infection    MRSA in 1995, negative since   Lupus ([place])    Schizophrenia ([place])    Seizures ([place])    Not recently   Sickle cell trait ([place])    Trichomonas infection     Patient Active Problem List   Diagnosis Date Noted   Abdominal pain 12/02/2017   Back pain affecting pregnancy in third trimester 03/20/2017   Labor, premature with delivery 03/20/2017   Preterm labor in third trimester 03/03/2017   Chronic hypertension in pregnancy 02/21/2017   GERD (gastroesophageal reflux disease) 77/41/2878   Umbilical hernia 67/67/2094   Hereditary disease in family possibly affecting fetus, fetus 1    Penicillin allergy 10/02/2016   Opiate use 09/25/2016   Supervision of high risk pregnancy, antepartum 09/19/2016   History of herpes genitalis 09/19/2016   History of trichomoniasis 09/19/2016   Advanced maternal age in multigravida, second trimester 09/19/2016   Grand multiparity  09/19/2016   Short interval between pregnancies affecting pregnancy in first trimester, antepartum 09/19/2016   Dichorionic diamniotic twin gestation 09/19/2016   Chronic hypertension 07/03/2016   Tobacco abuse 12/19/2014   Bipolar disorder ([place]) 12/19/2014   History of substance abuse ([place]) 12/19/2014   Sickle cell trait ([place]) 12/19/2014    Past Surgical History:  Procedure Laterality Date   CESAREAN SECTION N/A 03/21/2017   Procedure: CESAREAN SECTION;  Surgeon: Aletha Halim, MD;  Location: [facility];  Service: Obstetrics;  Laterality: N/A;   DILATION AND CURETTAGE OF UTERUS     HEMORROIDECTOMY  2010   HERNIA REPAIR     plastic surgery on face     UMBILICAL HERNIA REPAIR N/A 12/02/2017   Procedure: LAPAROSCOPIC EXCISION OF MESH ERAS PATHWAY;  Surgeon: Mickeal Skinner, MD;  Location: [facility];  Service: General;  Laterality: N/A;     OB History     Gravida  15   Para  9   Term  8   Preterm  1   AB  6   Living  10      SAB  6   IAB  0   Ectopic  0   Multiple  1   Live Births  69           Family History  Problem Relation Age of Onset   Cancer Mother  Heart failure Mother    Hypertension Mother    Stroke Mother    Diabetes Mother    Healthy Father    Hypertension Maternal Grandmother    Anesthesia problems Neg Hx     Social History   Tobacco Use   Smoking status: Former    Packs/day: 0.50    Years: 1.50    Pack years: 0.75    Types: Cigarettes    Quit date: 04/09/2015    Years since quitting: 5.6   Smokeless tobacco: Never  Vaping Use   Vaping Use: Never used  Substance Use Topics   Alcohol use: No    Comment: hx drug use   Drug use: No    Types: Marijuana, Cocaine    Comment: past use, 4 years ago, edibles 03/2019    Home Medications Prior to Admission medications   Medication Sig Start Date End Date Taking? Authorizing Provider  cetirizine (ZYRTEC ALLERGY) 10 MG tablet Take 1 tablet (10 mg total) by mouth daily.  07/13/20   Jaynee Eagles, PA-C  escitalopram (LEXAPRO) 10 MG tablet Take 10 mg by mouth daily.  08/22/18   [provider]  fluticasone (FLONASE) 50 MCG/ACT nasal spray Place 2 sprays into both nostrils daily. 07/13/20   Jaynee Eagles, PA-C  lamoTRIgine (LAMICTAL) 25 MG tablet Take 25 mg by mouth 2 (two) times daily. 09/14/19   [provider]  metroNIDAZOLE (FLAGYL) 500 MG tablet Take 1 tablet (500 mg total) by mouth 2 (two) times daily with a meal. DO NOT CONSUME ALCOHOL WHILE TAKING THIS MEDICATION. 07/13/20   Jaynee Eagles, PA-C  naproxen (NAPROSYN) 500 MG tablet Take 1 tablet (500 mg total) by mouth 2 (two) times daily. 10/29/19   Wieters, Hallie C, PA-C  NARCAN 4 MG/0.1ML LIQD nasal spray kit Place 1 spray into the nose once.  09/10/18   [provider]  QUEtiapine (SEROQUEL) 100 MG tablet Take 100 mg by mouth at bedtime. 09/14/19   [provider]  SUBOXONE 8-2 MG FILM Take 1 Film by mouth in the morning, at noon, and at bedtime.  10/16/18   [provider]  triamcinolone cream (KENALOG) 0.1 % Apply 1 application topically 2 (two) times daily. 02/03/20   Volney American, PA-C  omeprazole (PRILOSEC) 40 MG capsule Take 1 capsule (40 mg total) by mouth daily. Patient not taking: Reported on 09/27/2019 09/12/19 10/29/19  Raylene Everts, MD    Allergies    Peanut-containing drug products, Amoxicillin, and Latex  Review of Systems   Review of Systems  All other systems reviewed and are negative.  Physical Exam Updated Vital Signs BP 114/77 (BP Location: Right Arm)   Pulse 63   Temp 98.3 F (36.8 C) (Oral)   Resp 14   Ht 5' 3"  (1.6 m)   Wt 62.6 kg   SpO2 100%   BMI 24.45 kg/m   Physical Exam Vitals and nursing note reviewed.  40 year old female, resting comfortably and in no acute distress. Vital signs are normal. Oxygen saturation is 100%, which is normal. Head is normocephalic and atraumatic. PERRLA, EOMI. Oropharynx is clear. Neck is nontender  and supple without adenopathy or JVD. Back is nontender and there is no CVA tenderness. Lungs are clear without rales, wheezes, or rhonchi. Chest is nontender. Heart has regular rate and rhythm without murmur. Abdomen is soft, flat, with a small umbilical hernia present which is easily reducible.  There is tenderness to palpation of the hernia but there is  no rebound or guarding.  Peristalsis is normoactive. Pelvic: Normal external female genitalia.  Foreign body is noted in the vaginal fornix.  No significant discharge present.  Foreign bodies removed with ring forceps, cervix is noted to be closed.  On bimanual exam, there is no tenderness, no adnexal masses, no cervical motion tenderness. Extremities have no cyanosis or edema, full range of motion is present. Skin is warm and dry without rash. Neurologic: Mental status is normal, cranial nerves are intact, there are no motor or sensory deficits.  ED Results / Procedures / Treatments   Labs (all labs ordered are listed, but only abnormal results are displayed) Labs Reviewed  WET PREP, GENITAL - Abnormal; Notable for the following components:      Result Value   WBC, Wet Prep HPF POC FEW (*)    All other components within normal limits  URINALYSIS, ROUTINE W REFLEX MICROSCOPIC - Abnormal; Notable for the following components:   APPearance HAZY (*)    All other components within normal limits  PREGNANCY, URINE  RPR  HIV ANTIBODY (ROUTINE TESTING W REFLEX)  GC/CHLAMYDIA PROBE AMP ([place]) NOT AT [facility]    Procedures .Foreign Body Removal  Date/Time: 12/05/2020 6:19 AM Performed by: Delora Fuel, MD Authorized by: Delora Fuel, MD  Consent: Verbal consent obtained. Written consent not obtained. Risks and benefits: risks, benefits and alternatives were discussed Consent given by: patient Patient understanding: patient states understanding of the procedure being performed Patient consent: the patient's understanding of the  procedure matches consent given Procedure consent: procedure consent matches procedure scheduled Relevant documents: relevant documents present and verified Site marked: the operative site was marked Required items: required blood products, implants, devices, and special equipment available Patient identity confirmed: verbally with patient and arm band Time out: Immediately prior to procedure a "time out" was called to verify the correct patient, procedure, equipment, support staff and site/side marked as required. Body area: vagina  Sedation: Patient sedated: no  Patient restrained: no Patient cooperative: yes Localization method: speculum Removal mechanism: ring forceps Complexity: simple 1 objects recovered. Objects recovered: condom Post-procedure assessment: foreign body removed Patient tolerance: patient tolerated the procedure well with no immediate complications    Medications Ordered in ED Medications  acetaminophen (TYLENOL) tablet 650 mg (650 mg Oral Given 12/05/20 5009)    ED Course  I have reviewed the triage vital signs and the nursing notes.  Pertinent lab results that were available during my care of the patient were reviewed by me and considered in my medical decision making (see chart for details).   MDM Rules/Calculators/A&P                         Symptomatic umbilical hernia.  Lost condom in vagina.  Old records reviewed showing prior ED visits for vaginitis and umbilical hernia.  She is referred to general surgery for evaluation for possible herniorrhaphy.  Emergency contraception was offered, patient has declined.  Foreign body is removed with the aid of ring forceps, no obvious evidence of vaginal infection.  Specimens are sent to rule out sexually transmitted infections.  She was given a dose of acetaminophen for pain with good relief.  She is requesting a prescription for the acetaminophen that she got here.  When I explained to her that she could buy it  over-the-counter, she stated the know that she wanted prescription for specifically the medication she got here.  She is given a prescription for acetaminophen.  WEt  prep is negative, she is discharged.  Final Clinical Impression(s) / ED Diagnoses Final diagnoses:  Foreign body in vagina, initial encounter  Umbilical hernia without obstruction and without gangrene    Rx / DC Orders ED Discharge Orders          Ordered    acetaminophen (TYLENOL) 325 MG tablet  Every 6 hours PRN        12/05/20 4932             Delora Fuel, MD 41/99/14 [phone]

## 2020-12-05 NOTE — Discharge Instructions (Addendum)
Your tests for gonorrhea, chlamydia, syphilis, HIV will not be back for another 2-3 days.  Please look for the results on MyChart.  We will contact you if any of them are positive.  Please make an appointment with the surgeon to evaluate whether you are a candidate for surgery for your hernia.

## 2020-12-06 LAB — GC/CHLAMYDIA PROBE AMP (~~LOC~~) NOT AT ARMC
Chlamydia: NEGATIVE
Comment: NEGATIVE
Comment: NORMAL
Neisseria Gonorrhea: NEGATIVE

## 2020-12-06 LAB — RPR: RPR Ser Ql: NONREACTIVE

## 2020-12-22 ENCOUNTER — Emergency Department (HOSPITAL_COMMUNITY)
Admission: EM | Admit: 2020-12-22 | Discharge: 2020-12-22 | Disposition: A | Discharge status: Home / Self Care | Payer: Medicaid Other | Attending: Emergency Medicine | Admitting: Emergency Medicine

## 2020-12-22 ENCOUNTER — Encounter (HOSPITAL_COMMUNITY): Payer: Self-pay | Admitting: Emergency Medicine

## 2020-12-22 DIAGNOSIS — Z91010 Allergy to peanuts: Secondary | ICD-10-CM | POA: Diagnosis not present

## 2020-12-22 DIAGNOSIS — N9489 Other specified conditions associated with female genital organs and menstrual cycle: Secondary | ICD-10-CM | POA: Diagnosis not present

## 2020-12-22 DIAGNOSIS — Z20822 Contact with and (suspected) exposure to covid-19: Secondary | ICD-10-CM | POA: Insufficient documentation

## 2020-12-22 DIAGNOSIS — F319 Bipolar disorder, unspecified: Secondary | ICD-10-CM

## 2020-12-22 DIAGNOSIS — Z91040 Latex allergy status: Secondary | ICD-10-CM | POA: Diagnosis not present

## 2020-12-22 DIAGNOSIS — Y900 Blood alcohol level of less than 20 mg/100 ml: Secondary | ICD-10-CM | POA: Insufficient documentation

## 2020-12-22 DIAGNOSIS — Z79899 Other long term (current) drug therapy: Secondary | ICD-10-CM | POA: Diagnosis not present

## 2020-12-22 DIAGNOSIS — Z046 Encounter for general psychiatric examination, requested by authority: Secondary | ICD-10-CM | POA: Insufficient documentation

## 2020-12-22 DIAGNOSIS — Z638 Other specified problems related to primary support group: Secondary | ICD-10-CM

## 2020-12-22 DIAGNOSIS — Z87891 Personal history of nicotine dependence: Secondary | ICD-10-CM | POA: Insufficient documentation

## 2020-12-22 DIAGNOSIS — I10 Essential (primary) hypertension: Secondary | ICD-10-CM | POA: Insufficient documentation

## 2020-12-22 LAB — RAPID URINE DRUG SCREEN, HOSP PERFORMED
Amphetamines: NOT DETECTED
Barbiturates: NOT DETECTED
Benzodiazepines: NOT DETECTED
Cocaine: NOT DETECTED
Opiates: NOT DETECTED
Tetrahydrocannabinol: POSITIVE — AB

## 2020-12-22 LAB — COMPREHENSIVE METABOLIC PANEL
ALT: 12 U/L (ref 0–44)
AST: 17 U/L (ref 15–41)
Albumin: 4.3 g/dL (ref 3.5–5.0)
Alkaline Phosphatase: 52 U/L (ref 38–126)
Anion gap: 8 (ref 5–15)
BUN: 11 mg/dL (ref 6–20)
CO2: 27 mmol/L (ref 22–32)
Calcium: 9.2 mg/dL (ref 8.9–10.3)
Chloride: 104 mmol/L (ref 98–111)
Creatinine, Ser: 0.75 mg/dL (ref 0.44–1.00)
GFR, Estimated: >60 mL/min (ref >60)
Glucose, Bld: 103 mg/dL — ABNORMAL HIGH (ref 70–99)
Potassium: 3.3 mmol/L — ABNORMAL LOW (ref 3.5–5.1)
Sodium: 139 mmol/L (ref 135–145)
Total Bilirubin: 1.2 mg/dL (ref 0.3–1.2)
Total Protein: 7.1 g/dL (ref 6.5–8.1)

## 2020-12-22 LAB — CBC
HCT: 39 % (ref 36.0–46.0)
Hemoglobin: 13 g/dL (ref 12.0–15.0)
MCH: 30.3 pg (ref 26.0–34.0)
MCHC: 33.3 g/dL (ref 30.0–36.0)
MCV: 90.9 fL (ref 80.0–100.0)
Platelets: 198 10*3/uL (ref 150–400)
RBC: 4.29 MIL/uL (ref 3.87–5.11)
RDW: 13.8 % (ref 11.5–15.5)
WBC: 7.7 10*3/uL (ref 4.0–10.5)
nRBC: 0 % (ref 0.0–0.2)

## 2020-12-22 LAB — ACETAMINOPHEN LEVEL: Acetaminophen (Tylenol), Serum: <10 ug/mL — ABNORMAL LOW (ref 10–30)

## 2020-12-22 LAB — I-STAT BETA HCG BLOOD, ED (MC, WL, AP ONLY): I-stat hCG, quantitative: <5 m[IU]/mL (ref <5)

## 2020-12-22 LAB — RESP PANEL BY RT-PCR (FLU A&B, COVID) ARPGX2
Influenza A by PCR: NEGATIVE
Influenza B by PCR: NEGATIVE
SARS Coronavirus 2 by RT PCR: NEGATIVE

## 2020-12-22 LAB — SALICYLATE LEVEL: Salicylate Lvl: <7 mg/dL — ABNORMAL LOW (ref 7.0–30.0)

## 2020-12-22 LAB — ETHANOL: Alcohol, Ethyl (B): <10 mg/dL (ref <10)

## 2020-12-22 MED ORDER — POTASSIUM CHLORIDE CRYS ER 20 MEQ PO TBCR
40.0000 meq | EXTENDED_RELEASE_TABLET | Freq: Once | ORAL | Status: AC
  Administered 2020-12-22: 40 meq via ORAL
  Filled 2020-12-22: qty 2

## 2020-12-22 MED ORDER — ACETAMINOPHEN 325 MG PO TABS
650.0000 mg | ORAL_TABLET | ORAL | Status: DC | PRN
Start: 2020-12-22 — End: 2020-12-22
  Administered 2020-12-22: 650 mg via ORAL
  Filled 2020-12-22: qty 2

## 2020-12-22 MED ORDER — ARIPIPRAZOLE 5 MG PO TABS
5.0000 mg | ORAL_TABLET | Freq: Every day | ORAL | Status: DC
  Administered 2020-12-22: 5 mg via ORAL
  Filled 2020-12-22: qty 1
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
## 2020-12-22 NOTE — BH Assessment (Signed)
[facility] Assessment Progress Note   Per Maxie Barb, NP, this pt does not require psychiatric hospitalization at this time.  Pt presents under IVC initiated by pt's cousin and upheld by EDP Zadie Rhine, MD, which has been rescinded by Nelly Rout, MD.  Pt is psychiatrically cleared.  Discharge instructions advise pt to continue treatment with [facility], her current outpatient provider.  EDP Linwood Dibbles, MD and pt's nurse, Nash Dimmer, have been notified.  Doylene Canning, MA Triage Specialist [phone]

## 2020-12-22 NOTE — ED Provider Notes (Signed)
Patient was evaluated by the psychiatry team.  She is medically cleared and is psychiatrically cleared for outpatient treatment.  No indications for hospitalization at this time   Linwood Dibbles, MD 12/22/20 1209

## 2020-12-22 NOTE — Discharge Instructions (Signed)
For your behavioral health needs, you are advised to continue treatment with your regular outpatient provider at your earliest opportunity:       [facility]      [address]      [place], [place] [address]      [phone]
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
## 2021-02-20 ENCOUNTER — Other Ambulatory Visit: Payer: Self-pay | Admitting: Family Medicine

## 2021-03-29 ENCOUNTER — Encounter: Payer: Self-pay | Admitting: General Practice

## 2021-08-02 ENCOUNTER — Ambulatory Visit (HOSPITAL_COMMUNITY)
Admission: EM | Admit: 2021-08-02 | Discharge: 2021-08-03 | Disposition: A | Discharge status: Home / Self Care | Payer: Medicaid Other | Attending: Behavioral Health | Admitting: Behavioral Health

## 2021-08-02 DIAGNOSIS — Z046 Encounter for general psychiatric examination, requested by authority: Secondary | ICD-10-CM

## 2021-08-02 DIAGNOSIS — F19959 Other psychoactive substance use, unspecified with psychoactive substance-induced psychotic disorder, unspecified: Secondary | ICD-10-CM | POA: Diagnosis not present

## 2021-08-02 DIAGNOSIS — F319 Bipolar disorder, unspecified: Secondary | ICD-10-CM | POA: Diagnosis present

## 2021-08-02 DIAGNOSIS — Z20822 Contact with and (suspected) exposure to covid-19: Secondary | ICD-10-CM | POA: Insufficient documentation

## 2021-08-02 DIAGNOSIS — F1994 Other psychoactive substance use, unspecified with psychoactive substance-induced mood disorder: Secondary | ICD-10-CM | POA: Diagnosis present

## 2021-08-02 DIAGNOSIS — Z91148 Patient's other noncompliance with medication regimen for other reason: Secondary | ICD-10-CM | POA: Insufficient documentation

## 2021-08-02 DIAGNOSIS — F1910 Other psychoactive substance abuse, uncomplicated: Secondary | ICD-10-CM | POA: Diagnosis present

## 2021-08-02 LAB — POCT URINE DRUG SCREEN - MANUAL ENTRY (I-SCREEN)
POC Amphetamine UR: NOT DETECTED
POC Buprenorphine (BUP): NOT DETECTED
POC Cocaine UR: NOT DETECTED
POC Marijuana UR: POSITIVE — AB
POC Methadone UR: NOT DETECTED
POC Methamphetamine UR: NOT DETECTED
POC Morphine: NOT DETECTED
POC Oxazepam (BZO): NOT DETECTED
POC Oxycodone UR: NOT DETECTED
POC Secobarbital (BAR): NOT DETECTED

## 2021-08-02 LAB — RESP PANEL BY RT-PCR (FLU A&B, COVID) ARPGX2
Influenza A by PCR: NEGATIVE
Influenza B by PCR: NEGATIVE
SARS Coronavirus 2 by RT PCR: NEGATIVE

## 2021-08-02 LAB — COMPREHENSIVE METABOLIC PANEL
ALT: 18 U/L (ref 0–44)
AST: 25 U/L (ref 15–41)
Albumin: 3.8 g/dL (ref 3.5–5.0)
Alkaline Phosphatase: 43 U/L (ref 38–126)
Anion gap: 8 (ref 5–15)
BUN: 10 mg/dL (ref 6–20)
CO2: 26 mmol/L (ref 22–32)
Calcium: 9.3 mg/dL (ref 8.9–10.3)
Chloride: 105 mmol/L (ref 98–111)
Creatinine, Ser: 0.95 mg/dL (ref 0.44–1.00)
GFR, Estimated: >60 mL/min (ref >60)
Glucose, Bld: 76 mg/dL (ref 70–99)
Potassium: 3 mmol/L — ABNORMAL LOW (ref 3.5–5.1)
Sodium: 139 mmol/L (ref 135–145)
Total Bilirubin: 1.1 mg/dL (ref 0.3–1.2)
Total Protein: 6.3 g/dL — ABNORMAL LOW (ref 6.5–8.1)

## 2021-08-02 LAB — CBC WITH DIFFERENTIAL/PLATELET
Abs Immature Granulocytes: 0.03 10*3/uL (ref 0.00–0.07)
Basophils Absolute: 0.1 10*3/uL (ref 0.0–0.1)
Basophils Relative: 1 %
Eosinophils Absolute: 0.2 10*3/uL (ref 0.0–0.5)
Eosinophils Relative: 1 %
HCT: 38.2 % (ref 36.0–46.0)
Hemoglobin: 13.8 g/dL (ref 12.0–15.0)
Immature Granulocytes: 0 %
Lymphocytes Relative: 27 %
Lymphs Abs: 2.9 10*3/uL (ref 0.7–4.0)
MCH: 31.6 pg (ref 26.0–34.0)
MCHC: 36.1 g/dL — ABNORMAL HIGH (ref 30.0–36.0)
MCV: 87.4 fL (ref 80.0–100.0)
Monocytes Absolute: 1 10*3/uL (ref 0.1–1.0)
Monocytes Relative: 9 %
Neutro Abs: 6.6 10*3/uL (ref 1.7–7.7)
Neutrophils Relative %: 62 %
Platelets: 245 10*3/uL (ref 150–400)
RBC: 4.37 MIL/uL (ref 3.87–5.11)
RDW: 13.2 % (ref 11.5–15.5)
WBC: 10.7 10*3/uL — ABNORMAL HIGH (ref 4.0–10.5)
nRBC: 0 % (ref 0.0–0.2)

## 2021-08-02 LAB — LIPID PANEL
Cholesterol: 150 mg/dL (ref 0–200)
HDL: 60 mg/dL (ref >40)
LDL Cholesterol: 82 mg/dL (ref 0–99)
Total CHOL/HDL Ratio: 2.5 RATIO
Triglycerides: 41 mg/dL (ref <150)
VLDL: 8 mg/dL (ref 0–40)

## 2021-08-02 LAB — POC SARS CORONAVIRUS 2 AG: SARSCOV2ONAVIRUS 2 AG: NEGATIVE

## 2021-08-02 LAB — ETHANOL: Alcohol, Ethyl (B): <10 mg/dL (ref <10)

## 2021-08-02 LAB — HIV ANTIBODY (ROUTINE TESTING W REFLEX): HIV Screen 4th Generation wRfx: NONREACTIVE

## 2021-08-02 LAB — TSH: TSH: 0.6 u[IU]/mL (ref 0.350–4.500)

## 2021-08-02 MED ORDER — ACETAMINOPHEN 325 MG PO TABS: 650.0000 mg | ORAL_TABLET | Freq: Four times a day (QID) | ORAL | Status: DC | PRN

## 2021-08-02 MED ORDER — HYDROXYZINE HCL 25 MG PO TABS: 25.0000 mg | ORAL_TABLET | Freq: Three times a day (TID) | ORAL | Status: DC | PRN

## 2021-08-02 MED ORDER — ARIPIPRAZOLE 5 MG PO TABS
5.0000 mg | ORAL_TABLET | Freq: Every day | ORAL | Status: DC
  Administered 2021-08-03: 5 mg via ORAL
  Filled 2021-08-02: qty 1

## 2021-08-02 MED ORDER — MAGNESIUM HYDROXIDE 400 MG/5ML PO SUSP: 30.0000 mL | Freq: Every day | ORAL | Status: DC | PRN

## 2021-08-02 MED ORDER — ALUM & MAG HYDROXIDE-SIMETH 200-200-20 MG/5ML PO SUSP: 30.0000 mL | ORAL | Status: DC | PRN

## 2021-08-02 MED ORDER — TRAZODONE HCL 50 MG PO TABS: 50.0000 mg | ORAL_TABLET | Freq: Every day | ORAL | Status: DC

## 2021-08-02 NOTE — ED Notes (Signed)
Patient was admitted to obs unit today. Patient denies SI/HI. Patient has been calm and cooperative throughout the time she has been in the facility.  ?

## 2021-08-02 NOTE — ED Notes (Signed)
Pt sleeping at present, no distress noted. Respirations even & unlabored.  Monitoring for safety. 
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
## 2021-08-03 ENCOUNTER — Encounter (HOSPITAL_COMMUNITY): Payer: Self-pay | Admitting: Registered Nurse

## 2021-08-03 DIAGNOSIS — F1910 Other psychoactive substance abuse, uncomplicated: Secondary | ICD-10-CM | POA: Diagnosis present

## 2021-08-03 DIAGNOSIS — Z046 Encounter for general psychiatric examination, requested by authority: Secondary | ICD-10-CM

## 2021-08-03 DIAGNOSIS — F1994 Other psychoactive substance use, unspecified with psychoactive substance-induced mood disorder: Secondary | ICD-10-CM | POA: Diagnosis present

## 2021-08-03 LAB — POCT PREGNANCY, URINE: Preg Test, Ur: NEGATIVE

## 2021-08-03 LAB — RPR: RPR Ser Ql: NONREACTIVE

## 2021-08-03 MED ORDER — POTASSIUM CHLORIDE CRYS ER 20 MEQ PO TBCR
40.0000 meq | EXTENDED_RELEASE_TABLET | Freq: Once | ORAL | Status: AC
  Administered 2021-08-03: 40 meq via ORAL
  Filled 2021-08-03: qty 2

## 2021-08-03 NOTE — ED Provider Notes (Deleted)
Pt potassium level result at 3.3 ?40 meq order x 1  ?

## 2021-08-03 NOTE — Progress Notes (Signed)
Patient resting with unlabored respirations. Staff will continue to monitor. ?

## 2021-08-03 NOTE — ED Notes (Signed)
Pt sleeping at present, no distress noted. Respirations even & unlabored.  Monitoring for safety. 

## 2021-08-03 NOTE — ED Notes (Signed)
Pt resting at present, no distress noted. Respirations even & unlabored.  Monitoring for safety. °

## 2021-08-03 NOTE — BH Assessment (Signed)
BHH Assessment Progress Note ?  ?Per Shuvon Rankin NP, this pt does not require psychiatric hospitalization at this time.  Pt is psychiatrically cleared.  Discharge instructions advise pt to follow up with [facility].  Shuvon has been notified. ? ?Doylene Canning, MA ?Triage Specialist ?[phone]  ?

## 2021-08-03 NOTE — Discharge Instructions (Addendum)
For your behavioral health needs you are advised to follow up with the [facility].  They offer psychiatry, therapy and treatment for substance use disorders.  Contact them at your earliest opportunity to ask about enrolling in their program: ? ?     [facility] ?     [address] ?     [place], [place] [address] ?     [phone]  ? ?
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
## 2021-08-03 NOTE — Discharge Summary (Signed)
[name] to be D/C'd Home per NP order. Discussed with the patient and all questions fully answered. An After Visit Summary was printed and given to the patient. Patient escorted out and D/C home via taxi cab. ?[name]  [name]  ?08/03/2021 3:07 PM ?  ?   ?

## 2021-08-03 NOTE — ED Provider Notes (Signed)
Pt potasium level resulted at 3.0 a drop from 3.3 ?Order potasium 40 MEq x 1 ?

## 2021-08-06 LAB — HEMOGLOBIN A1C
Hgb A1c MFr Bld: <4.2 % — ABNORMAL LOW (ref 4.8–5.6)
Mean Plasma Glucose: <74 mg/dL

## 2021-09-18 ENCOUNTER — Ambulatory Visit (HOSPITAL_COMMUNITY)
Admission: EM | Admit: 2021-09-18 | Discharge: 2021-09-18 | Disposition: A | Discharge status: Home / Self Care | Payer: Medicaid Other | Attending: Psychiatry | Admitting: Psychiatry

## 2021-09-18 DIAGNOSIS — F319 Bipolar disorder, unspecified: Secondary | ICD-10-CM | POA: Insufficient documentation

## 2021-09-18 DIAGNOSIS — F1410 Cocaine abuse, uncomplicated: Secondary | ICD-10-CM | POA: Insufficient documentation
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
## 2021-09-18 NOTE — Progress Notes (Signed)
   09/18/21 1437  [facility] Triage Screening (Walk-ins at [facility] only)  What Is the Reason for Your Visit/Call Today? [name] 41 year old female present voluntary to [facility] reporting she stopped taking her medication. Dx history Bipolar and Depression. Report dealing with multiple stressors. Denied suicidal/homicidal and denied auditory/visual hallucinations. Report relapsed 4 months ago after been clean for 13 years. Report continues to use to self-medicate. Last used cocaine this morning 09/18/2021. Report uses cocaine to relieve stress. Denied additional substance.  How Long Has This Been Causing You Problems? 1 wk - 1 month  Have You Recently Had Any Thoughts About Hurting Yourself? No  Are You Planning to Commit Suicide/Harm Yourself At This time? No  Have you Recently Had Thoughts About Hurting Someone [name]? No  Are You Planning To Harm Someone At This Time? No  Are you currently experiencing any auditory, visual or other hallucinations? No  Have You Used Any Alcohol or Drugs in the Past 24 Hours? No  Do you have any current medical co-morbidities that require immediate attention? No  Clinician description of patient physical appearance/behavior: Patient present in a pleasant manner, cooperative and dressed appropriately for the weather.  What Do You Feel Would Help You the Most Today? Medication(s)  If access to [facility] was not available, would you have sought care in the Emergency Department? No  Determination of Need Routine (7 days)  Options For Referral Medication Management

## 2021-10-16 ENCOUNTER — Telehealth (HOSPITAL_COMMUNITY): Payer: Self-pay | Admitting: General Practice

## 2021-10-16 NOTE — BH Assessment (Signed)
Care Management - [facility] Follow Up Discharges   Writer attempted to make contact with patient today and was unsuccessful.  Writer left a HIPPA compliant voice message.   Per chart review, patient was provided with substance abuse outpatient resources.  

## 2021-10-29 ENCOUNTER — Emergency Department (HOSPITAL_COMMUNITY): Payer: Medicaid Other

## 2021-10-29 ENCOUNTER — Emergency Department (HOSPITAL_COMMUNITY)
Admission: EM | Admit: 2021-10-29 | Discharge: 2021-10-29 | Discharge status: Against Medical Advice | Payer: Medicaid Other | Attending: Emergency Medicine | Admitting: Emergency Medicine

## 2021-10-29 DIAGNOSIS — R799 Abnormal finding of blood chemistry, unspecified: Secondary | ICD-10-CM | POA: Diagnosis not present

## 2021-10-29 DIAGNOSIS — I10 Essential (primary) hypertension: Secondary | ICD-10-CM | POA: Diagnosis not present

## 2021-10-29 DIAGNOSIS — Z20822 Contact with and (suspected) exposure to covid-19: Secondary | ICD-10-CM | POA: Insufficient documentation

## 2021-10-29 DIAGNOSIS — O26891 Other specified pregnancy related conditions, first trimester: Secondary | ICD-10-CM | POA: Diagnosis not present

## 2021-10-29 DIAGNOSIS — R4182 Altered mental status, unspecified: Secondary | ICD-10-CM | POA: Insufficient documentation

## 2021-10-29 DIAGNOSIS — Z91040 Latex allergy status: Secondary | ICD-10-CM | POA: Diagnosis not present

## 2021-10-29 DIAGNOSIS — R531 Weakness: Secondary | ICD-10-CM | POA: Diagnosis not present

## 2021-10-29 DIAGNOSIS — O0289 Other abnormal products of conception: Secondary | ICD-10-CM | POA: Diagnosis not present

## 2021-10-29 DIAGNOSIS — Z91010 Allergy to peanuts: Secondary | ICD-10-CM | POA: Insufficient documentation

## 2021-10-29 DIAGNOSIS — I639 Cerebral infarction, unspecified: Secondary | ICD-10-CM | POA: Diagnosis not present

## 2021-10-29 DIAGNOSIS — Z3A01 Less than 8 weeks gestation of pregnancy: Secondary | ICD-10-CM | POA: Diagnosis not present

## 2021-10-29 DIAGNOSIS — G43109 Migraine with aura, not intractable, without status migrainosus: Secondary | ICD-10-CM

## 2021-10-29 DIAGNOSIS — G43809 Other migraine, not intractable, without status migrainosus: Secondary | ICD-10-CM | POA: Insufficient documentation

## 2021-10-29 LAB — COMPREHENSIVE METABOLIC PANEL
ALT: 11 U/L (ref 0–44)
AST: 14 U/L — ABNORMAL LOW (ref 15–41)
Albumin: 3.8 g/dL (ref 3.5–5.0)
Alkaline Phosphatase: 43 U/L (ref 38–126)
Anion gap: 11 (ref 5–15)
BUN: 7 mg/dL (ref 6–20)
CO2: 18 mmol/L — ABNORMAL LOW (ref 22–32)
Calcium: 9.1 mg/dL (ref 8.9–10.3)
Chloride: 106 mmol/L (ref 98–111)
Creatinine, Ser: 0.64 mg/dL (ref 0.44–1.00)
GFR, Estimated: >60 mL/min (ref >60)
Glucose, Bld: 89 mg/dL (ref 70–99)
Potassium: 3.8 mmol/L (ref 3.5–5.1)
Sodium: 135 mmol/L (ref 135–145)
Total Bilirubin: 0.9 mg/dL (ref 0.3–1.2)
Total Protein: 6.5 g/dL (ref 6.5–8.1)

## 2021-10-29 LAB — CBC
HCT: 34.7 % — ABNORMAL LOW (ref 36.0–46.0)
Hemoglobin: 12.2 g/dL (ref 12.0–15.0)
MCH: 31.1 pg (ref 26.0–34.0)
MCHC: 35.2 g/dL (ref 30.0–36.0)
MCV: 88.5 fL (ref 80.0–100.0)
Platelets: 180 10*3/uL (ref 150–400)
RBC: 3.92 MIL/uL (ref 3.87–5.11)
RDW: 14 % (ref 11.5–15.5)
WBC: 10.5 10*3/uL (ref 4.0–10.5)
nRBC: 0 % (ref 0.0–0.2)

## 2021-10-29 LAB — DIFFERENTIAL
Abs Immature Granulocytes: 0.08 10*3/uL — ABNORMAL HIGH (ref 0.00–0.07)
Basophils Absolute: 0 10*3/uL (ref 0.0–0.1)
Basophils Relative: 0 %
Eosinophils Absolute: 0 10*3/uL (ref 0.0–0.5)
Eosinophils Relative: 0 %
Immature Granulocytes: 1 %
Lymphocytes Relative: 20 %
Lymphs Abs: 2.1 10*3/uL (ref 0.7–4.0)
Monocytes Absolute: 0.8 10*3/uL (ref 0.1–1.0)
Monocytes Relative: 8 %
Neutro Abs: 7.5 10*3/uL (ref 1.7–7.7)
Neutrophils Relative %: 71 %

## 2021-10-29 LAB — APTT: aPTT: 32 seconds (ref 24–36)

## 2021-10-29 LAB — RAPID URINE DRUG SCREEN, HOSP PERFORMED
Amphetamines: NOT DETECTED
Barbiturates: NOT DETECTED
Benzodiazepines: NOT DETECTED
Cocaine: NOT DETECTED
Opiates: NOT DETECTED
Tetrahydrocannabinol: NOT DETECTED

## 2021-10-29 LAB — URINALYSIS, ROUTINE W REFLEX MICROSCOPIC
Bilirubin Urine: NEGATIVE
Glucose, UA: NEGATIVE mg/dL
Hgb urine dipstick: NEGATIVE
Ketones, ur: NEGATIVE mg/dL
Leukocytes,Ua: NEGATIVE
Nitrite: NEGATIVE
Protein, ur: NEGATIVE mg/dL
Specific Gravity, Urine: 1.042 — ABNORMAL HIGH (ref 1.005–1.030)
pH: 6 (ref 5.0–8.0)

## 2021-10-29 LAB — PROTIME-INR
INR: 1 (ref 0.8–1.2)
Prothrombin Time: 13.3 seconds (ref 11.4–15.2)

## 2021-10-29 LAB — I-STAT BETA HCG BLOOD, ED (MC, WL, AP ONLY): I-stat hCG, quantitative: >2000 m[IU]/mL — ABNORMAL HIGH (ref <5)

## 2021-10-29 LAB — I-STAT CHEM 8, ED
BUN: 7 mg/dL (ref 6–20)
Calcium, Ion: 1.11 mmol/L — ABNORMAL LOW (ref 1.15–1.40)
Chloride: 104 mmol/L (ref 98–111)
Creatinine, Ser: 0.5 mg/dL (ref 0.44–1.00)
Glucose, Bld: 89 mg/dL (ref 70–99)
HCT: 38 % (ref 36.0–46.0)
Hemoglobin: 12.9 g/dL (ref 12.0–15.0)
Potassium: 3.7 mmol/L (ref 3.5–5.1)
Sodium: 134 mmol/L — ABNORMAL LOW (ref 135–145)
TCO2: 19 mmol/L — ABNORMAL LOW (ref 22–32)

## 2021-10-29 LAB — ABO/RH: ABO/RH(D): A POS

## 2021-10-29 LAB — RESP PANEL BY RT-PCR (FLU A&B, COVID) ARPGX2
Influenza A by PCR: NEGATIVE
Influenza B by PCR: NEGATIVE
SARS Coronavirus 2 by RT PCR: NEGATIVE

## 2021-10-29 LAB — AMMONIA: Ammonia: 22 umol/L (ref 9–35)

## 2021-10-29 LAB — ETHANOL: Alcohol, Ethyl (B): <10 mg/dL (ref <10)

## 2021-10-29 LAB — TSH: TSH: 1.256 u[IU]/mL (ref 0.350–4.500)

## 2021-10-29 LAB — CBG MONITORING, ED: Glucose-Capillary: 97 mg/dL (ref 70–99)

## 2021-10-29 MED ORDER — IOHEXOL 350 MG/ML SOLN
75.0000 mL | Freq: Once | INTRAVENOUS | Status: AC | PRN
  Administered 2021-10-29: 75 mL via INTRAVENOUS

## 2021-10-29 MED ORDER — METOCLOPRAMIDE HCL 5 MG/ML IJ SOLN
10.0000 mg | Freq: Once | INTRAMUSCULAR | Status: AC
  Administered 2021-10-29: 10 mg via INTRAVENOUS
  Filled 2021-10-29: qty 2

## 2021-10-29 MED ORDER — DIPHENHYDRAMINE HCL 50 MG/ML IJ SOLN
25.0000 mg | Freq: Once | INTRAMUSCULAR | Status: AC
  Administered 2021-10-29: 25 mg via INTRAVENOUS
  Filled 2021-10-29: qty 1

## 2021-10-29 NOTE — Consult Note (Signed)
Neurology Consultation Reason for Consult: Concern for stroke Referring Physician: Rancourt, S  CC: Concern for stroke  History is obtained from: Patient, EMS  HPI: [name] is a 41 y.o. female with a history of "seizures" in the chart as well as substance abuse who presents with an abrupt change this afternoon.  She was in her normal state of health a week ago per the patient, though she was apparently still walking and talking until 220 this afternoon.  She had been complaining of a headache and she walked into the infirmary with complaint of not feeling well.  She then started slurring her words and was not moving her left side well.  In the emergency department, code stroke was activated and she was evaluated by me emergently in the CT scanner.  The last known well was markedly unclear and therefore she was not a candidate for IV thrombolytic, and no LVO on scan.   LKW: Unclear tpa given?: no, unclear time of onset   Past Medical History:  Diagnosis Date   Anemia    Bipolar 1 disorder (HCC)    Chlamydia    Chronic abdominal pain    Chronic nausea    Deliberate self-cutting    History of substance abuse (HCC)    HSV-2 infection 2015   Hypertension    Infection    MRSA in 1995, negative since   Lupus (HCC)    Schizophrenia (HCC)    Seizures (HCC)    Not recently   Sickle cell trait (HCC)    Trichomonas infection      Family History  Problem Relation Age of Onset   Cancer Mother    Heart failure Mother    Hypertension Mother    Stroke Mother    Diabetes Mother    Healthy Father    Hypertension Maternal Grandmother    Anesthesia problems Neg Hx      Social History:  reports that she quit smoking about 6 years ago. Her smoking use included cigarettes. She has a 0.75 pack-year smoking history. She has never used smokeless tobacco. She reports that she does not drink alcohol and does not use drugs.   Exam: Current vital signs: BP 119/74   Pulse 71    Temp 98.7 F (37.1 C) (Rectal)   Resp 17   SpO2 100%  Vital signs in last 24 hours: Temp:  [98.7 F (37.1 C)] 98.7 F (37.1 C) (07/24 1758) Pulse Rate:  [69-97] 71 (07/24 1830) Resp:  [15-20] 17 (07/24 1830) BP: (119-141)/(74-91) 119/74 (07/24 1830) SpO2:  [100 %] 100 % (07/24 1830)   Physical Exam  Constitutional: Appears well-developed and well-nourished.  Psych: Affect appropriate to situation Eyes: No scleral injection HENT: No OP obstruction MSK: no joint deformities.  Cardiovascular: Normal rate and regular rhythm.  Respiratory: Effort normal, non-labored breathing GI: Soft.  No distension. There is no tenderness.  Skin: WDI  Neuro: Mental Status: Patient is awake, alert, at times she speaks relatively clearly, and at other times she holds her mouth in such a way as to appear to slur her words, but it does not appear to be a physiological dysarthria. Cranial Nerves: II: Visual Fields are full. Pupils are equal, round, and reactive to light.   III,IV, VI: EOMI without ptosis or diploplia.  V: Facial sensation is symmetric to temperature VII: She holds the left side of her face immobile, however at times when distracted she appears to move her left side of her face fairly  well. Motor: Whenever her left side is lifted, she actively pulls it down, when placed above her face, she clearly avoids it, she has an inconsistent and nonphysiological appearing exam Sensory: Sensation is symmetric to light touch and temperature in the arms and legs. Cerebellar: She does not perform     I have reviewed labs in epic and the results pertinent to this consultation are: Creatinine 0.64  I have reviewed the images obtained: CT head-negative, CTA head and neck-negative  Impression: 41 year old female presenting from jail with signs and symptoms most consistent with nonphysiological process.  He is complaining of a headache, and it is possible that she has some degree of complicated  migraine with psychological overlay.  I think it would be reasonable to try treating for complicated migraine, and if her symptoms resolve then I do not think any further work-up would be needed.  If she continues to have severe symptoms, then I think it would be reasonable to do an MRI and an EEG, but I suspect that this is extremely low yield given the nonphysiological character of her exam.  Recommendations: 1) would consider treating as complicated migraine given her complaints of headache 2) if she continues to have symptoms, could consider MRI/EEG   Ritta Slot, MD [facility] [phone]  If 7pm- 7am, please page neurology on call as listed in AMION.

## 2021-10-29 NOTE — ED Provider Notes (Signed)
[facility] EMERGENCY DEPARTMENT Provider Note   CSN: [id number] Arrival date & time: 10/29/21  1706     History  No chief complaint on file.   [name] is a 41 y.o. female.  Level 5 caveat for altered mental status.  Patient brought in from jail by EMS with sudden onset change in mental status.  Jail staff reports patient went to the infirmary "not feeling well" and complained of a headache.  She apparently then began slurring her words and not holding her head up.  Was noted to have some difficulty moving her left side and bilateral legs. Jail staff thinks she may have passed out.  Unknown if she hit her head.  Unknown if she takes any blood thinners.  Patient unable to give any reliable history.  No reported fever, nausea, vomiting, chest pain or shortness of breath.  She has been in jail since June 22. Unknown drug use. They report a questionable seizure disorder history but no seizure medications seen on med list. Chart review shows history of bipolar disorders, schizophrenia, lupus, hypertension.  The history is provided by the patient and the EMS personnel. The history is limited by the condition of the patient.       Home Medications Prior to Admission medications   Medication Sig Start Date End Date Taking? Authorizing Provider  ARIPiprazole (ABILIFY) 5 MG tablet Take 5 mg by mouth at bedtime. Patient not taking: Reported on 10/29/2021 10/11/20   [provider]  ketorolac (TORADOL) 10 MG tablet Take 10 mg by mouth 4 (four) times daily as needed (For pain). Patient not taking: Reported on 10/29/2021 06/26/21   [provider]  SUBOXONE 8-2 MG FILM Place 1 strip under the tongue daily. Patient not taking: Reported on 10/29/2021 07/02/21   [provider]  triamcinolone cream (KENALOG) 0.1 % Apply 1 application topically 2 (two) times daily. Patient not taking: Reported on 10/29/2021 02/03/20   Particia Nearing, PA-C   omeprazole (PRILOSEC) 40 MG capsule Take 1 capsule (40 mg total) by mouth daily. Patient not taking: Reported on 09/27/2019 09/12/19 10/29/19  Eustace Moore, MD      Allergies    Peanut-containing drug products, Amoxicillin, and Latex    Review of Systems   Review of Systems  Unable to perform ROS: Mental status change    Physical Exam Updated Vital Signs BP 120/84   Pulse 73   Temp 98.4 F (36.9 C) (Oral)   Resp 19   SpO2 100%  Physical Exam Constitutional:      Appearance: Normal appearance.     Comments: Obtunded, not answering questions, head slumped to the side.  Intermittently moans  HENT:     Head: Normocephalic and atraumatic.     Nose: No congestion.     Mouth/Throat:     Mouth: Mucous membranes are moist.  Eyes:     Extraocular Movements: Extraocular movements intact.     Pupils: Pupils are equal, round, and reactive to light.  Cardiovascular:     Rate and Rhythm: Normal rate and regular rhythm.     Heart sounds: No murmur heard. Pulmonary:     Effort: No respiratory distress.     Breath sounds: No wheezing.  Chest:     Chest wall: No tenderness.  Abdominal:     Tenderness: There is no abdominal tenderness. There is no guarding or rebound.  Musculoskeletal:        General: No swelling or tenderness. Normal  range of motion.  Skin:    General: Skin is warm.     Coloration: Skin is not jaundiced.     Findings: No rash.  Neurological:     Mental Status: She is alert.     Comments: Minimally cooperative.  She would not cooperate for formal testing.  When attempting to hold arms up against gravity she allows her left arm to fall to the bed but protects her face.  On attempting to lift her legs off the bed she lets them fall to the bed violently will not keep them elevated. Attempted finger-to-nose testing on the right which she is poorly compliant with. No obvious facial droop     ED Results / Procedures / Treatments   Labs (all labs ordered are  listed, but only abnormal results are displayed) Labs Reviewed  CBC - Abnormal; Notable for the following components:      Result Value   HCT 34.7 (*)    All other components within normal limits  DIFFERENTIAL - Abnormal; Notable for the following components:   Abs Immature Granulocytes 0.08 (*)    All other components within normal limits  COMPREHENSIVE METABOLIC PANEL - Abnormal; Notable for the following components:   CO2 18 (*)    AST 14 (*)    All other components within normal limits  I-STAT CHEM 8, ED - Abnormal; Notable for the following components:   Sodium 134 (*)    Calcium, Ion 1.11 (*)    TCO2 19 (*)    All other components within normal limits  I-STAT BETA HCG BLOOD, ED ([facility], [facility], [facility] ONLY) - Abnormal; Notable for the following components:   I-stat hCG, quantitative >2,000.0 (*)    All other components within normal limits  RESP PANEL BY RT-PCR (FLU A&B, COVID) ARPGX2  ETHANOL  PROTIME-INR  APTT  AMMONIA  TSH  RAPID URINE DRUG SCREEN, HOSP PERFORMED  URINALYSIS, ROUTINE W REFLEX MICROSCOPIC  HCG, QUANTITATIVE, PREGNANCY  CK  CBG MONITORING, ED  ABO/RH    EKG EKG Interpretation  Date/Time:  Monday October 29 2021 18:03:56 EDT Ventricular Rate:  68 PR Interval:  155 QRS Duration: 89 QT Interval:  403 QTC Calculation: 429 R Axis:   88 Text Interpretation: Sinus rhythm Probable left ventricular hypertrophy No significant change was found Confirmed by Glynn Octave [id number]) on 10/29/2021 8:40:11 PM  Radiology US OB Comp Less 14 Wks  Result Date: 10/29/2021 CLINICAL DATA:  AMS. EXAM: OBSTETRIC <14 WK ULTRASOUND TECHNIQUE: Transabdominal ultrasound was performed for evaluation of the gestation as well as the maternal uterus and adnexal regions. COMPARISON:  March 21, 2017 FINDINGS: Intrauterine gestational sac: Single Yolk sac:  Visualized. Embryo:  Visualized. Cardiac Activity: Visualized. Heart Rate: 128 bpm CRL:   9.95 mm   7 w 0 d                  [facility]:  June 17, 2022 Subchorionic hemorrhage:  None visualized. Maternal uterus/adnexae: The right and left ovaries are visualized and are normal in appearance. No pelvic free fluid is seen. IMPRESSION: Single, viable intrauterine pregnancy at approximately 7 weeks and 0 days gestation by ultrasound evaluation. Electronically Signed   By: Aram Candela M.D.   On: 10/29/2021 19:19   CT ANGIO HEAD NECK W WO CM  Result Date: 10/29/2021 CLINICAL DATA:  Neuro deficit, stroke suspected EXAM: CT ANGIOGRAPHY HEAD AND NECK TECHNIQUE: Multidetector CT imaging of the head and neck was performed using the standard protocol during  bolus administration of intravenous contrast. Multiplanar CT image reconstructions and MIPs were obtained to evaluate the vascular anatomy. Carotid stenosis measurements (when applicable) are obtained utilizing NASCET criteria, using the distal internal carotid diameter as the denominator. RADIATION DOSE REDUCTION: This exam was performed according to the departmental dose-optimization program which includes automated exposure control, adjustment of the mA and/or kV according to patient size and/or use of iterative reconstruction technique. CONTRAST:  34mL OMNIPAQUE IOHEXOL 350 MG/ML SOLN COMPARISON:  No prior CTA, correlation is made with CT head 10/29/2021 FINDINGS: CT HEAD FINDINGS For noncontrast findings, please see same day CT head. CTA NECK FINDINGS Aortic arch: Standard branching. Imaged portion shows no evidence of aneurysm or dissection. No significant stenosis of the major arch vessel origins. Right carotid system: No evidence of dissection, occlusion, or hemodynamically significant stenosis (greater than 50%). Left carotid system: No evidence of dissection, occlusion, or hemodynamically significant stenosis (greater than 50%). Vertebral arteries: Codominant. No evidence of dissection, occlusion, or hemodynamically significant stenosis (greater than 50%). Skeleton: No acute osseous  abnormality.  Edentulous. Other neck: Negative. Upper chest: Negative. Review of the MIP images confirms the above findings CTA HEAD FINDINGS Anterior circulation: Both internal carotid arteries are patent to the termini, without significant stenosis. A1 segments patent. Normal anterior communicating artery. Anterior cerebral arteries are patent to their distal aspects. No M1 stenosis or occlusion. MCA branches perfused and symmetric. Posterior circulation: Vertebral arteries patent to the vertebrobasilar junction without stenosis. Posterior inferior cerebellar arteries patent proximally. Basilar patent to its distal aspect. Superior cerebellar arteries patent proximally. Patent P1 segments. PCAs perfused to their distal aspects without stenosis. The bilateral posterior communicating arteries are patent. Venous sinuses: As permitted by contrast timing, patent. Anatomic variants: None significant. Review of the MIP images confirms the above findings IMPRESSION: 1.  No intracranial large vessel occlusion or significant stenosis. 2.  No hemodynamically significant stenosis in the neck. Electronically Signed   By: Wiliam Ke M.D.   On: 10/29/2021 17:54   CT HEAD CODE STROKE WO CONTRAST  Result Date: 10/29/2021 CLINICAL DATA:  Code stroke. EXAM: CT HEAD WITHOUT CONTRAST TECHNIQUE: Contiguous axial images were obtained from the base of the skull through the vertex without intravenous contrast. RADIATION DOSE REDUCTION: This exam was performed according to the departmental dose-optimization program which includes automated exposure control, adjustment of the mA and/or kV according to patient size and/or use of iterative reconstruction technique. COMPARISON:  05/19/2013 FINDINGS: Brain: No evidence of acute infarction, hemorrhage, cerebral edema, mass, mass effect, or midline shift. No hydrocephalus or extra-axial collection. Vascular: No hyperdense vessel. Skull: Negative for fracture or focal lesion.  Sinuses/Orbits: No acute finding. Other: The mastoid air cells are well aerated. ASPECTS [facility] Stroke Program Early CT Score) - Ganglionic level infarction (caudate, lentiform nuclei, internal capsule, insula, M1-M3 cortex): 7 - Supraganglionic infarction (M4-M6 cortex): 3 Total score (0-10 with 10 being normal): 10 IMPRESSION: 1. No acute intracranial process. 2. ASPECTS is 10 Code stroke imaging results were communicated on 10/29/2021 at 5:29 pm to provider Dr. Amada Jupiter via secure text paging. Electronically Signed   By: Wiliam Ke M.D.   On: 10/29/2021 17:29    Procedures Procedures    Medications Ordered in ED Medications - No data to display  ED Course/ Medical Decision Making/ A&P                           Medical Decision Making Amount and/or Complexity of Data Reviewed  Labs: ordered. Decision-making details documented in ED Course. Radiology: ordered and independent interpretation performed. Decision-making details documented in ED Course. ECG/medicine tests: ordered and independent interpretation performed. Decision-making details documented in ED Course.  Risk Prescription drug management.   Altered mental status from jail. Last seen normal about 1440.  She apparently was complaining of a headache and had a syncopal episode.  Jail staff members found to be confused with left-sided weakness, difficulty speaking and difficulty following commands.  Code stroke was activated on initial assessment.  CBG was 105.  Patient protecting airway.  CT head negative for hemorrhage or large ischemic stroke.  Results reviewed and interpreted by me.  Discussed with Dr. Amada Jupiter of neurology.  He evaluated patient at bedside.  He agrees unlikely to be a stroke and has significant psychogenic component to her exam.  She protects her arm from a falling face and intermittently follows commands.  He does not recommend further stroke work-up unless her mental status does not improve with  migraine treatment.  Patient found to be incidentally pregnant.  Pregnancy test in April was negative.  Neuroimaging is negative.  CT head is negative, CTA is negative for aneurysm or subarachnoid hemorrhage.  No evidence of LVO or stenosis.  Patient improved with Reglan and Benadryl.  She is now awake and alert and following commands appropriately and answering questions appropriately.  No focal deficits observed.  She does not recall the events of earlier today remembers going outside to the rec yard and the next thing she knows she was here.  Does not recall having a headache or recall having a visit to the infirmary. Discussed recommendations of neurological's with her including MRI and EEG for further evaluation of stroke versus seizure-like activity.  She states she does not want to be admitted to the hospital and wants to go back to jail.  She appears to have capacity to make this decision.  She is alert and oriented x3, not homicidal or suicidal. She understands that we have not ruled out a stroke to rule out a seizure-like activity which could be a threat to both her and her unborn child.  Discussed that she is currently [redacted] weeks pregnant and needs to follow-up with gynecology regarding this. Low suspicion for preeclampsia given early gestational age.  Patient continues to refuse to be admitted.  She understands she is leaving AGAINST MEDICAL ADVICE and noted to have capacity to make this decision.  She understands she is free to return at any time.  Referrals given to neurology as well as OB/GYN.        Final Clinical Impression(s) / ED Diagnoses Final diagnoses:  Complicated migraine  Less than [redacted] weeks gestation of pregnancy    Rx / DC Orders ED Discharge Orders     None         Daquawn Seelman, Jeannett Senior, MD 10/29/21 2304

## 2021-10-29 NOTE — Code Documentation (Signed)
Stroke Response Nurse Documentation Code Documentation  LAMA NARAYANAN is a 41 y.o. female arriving to [facility]  via [facility] on 10/29/2021 with past medical hx of Bipolar, PTSD. On No antithrombotic. Code stroke was activated by ED.   Patient from jail where she was LKW at 1420 and now complaining of left sided weakness and slurred speech. Patient was seen in the infirmary for general complaint of not feeling well. She then was noted to be slurring her words and having difficulty moving her left side.   Stroke team at the bedside on patient arrival. Labs drawn and patient cleared for CT by Dr. Manus Gunning. Patient to CT with team. NIHSS 10, see documentation for details and code stroke times. Patient with disoriented, left facial droop, bilateral arm weakness, bilateral leg weakness, and dysarthria  on exam. The following imaging was completed:  CT Head and CTA. Patient is not a candidate for IV Thrombolytic due to last known well unclear. Patient is not a candidate for IR due to negative for LVO.   Care Plan: Code stroke cancelled at 1739.   Bedside handoff with ED RN Chloe.    Johnchristopher Sarvis L Markee Matera  Rapid Response RN

## 2021-10-29 NOTE — Discharge Instructions (Addendum)
You are leaving AGAINST MEDICAL ADVICE.  Further testing was recommended including MRI and EEG.  You declined these tests.  Follow-up with your primary doctor as well as neurologist.  You are approximately 7 to [redacted] weeks pregnant today should follow-up with the [facility] doctor as well.  Return to the ED with new or worsening symptoms reviewed to be reevaluated.

## 2021-12-05 ENCOUNTER — Inpatient Hospital Stay (HOSPITAL_COMMUNITY): Payer: Medicaid Other

## 2021-12-05 ENCOUNTER — Encounter: Payer: Self-pay | Admitting: Family Medicine

## 2021-12-05 ENCOUNTER — Other Ambulatory Visit (HOSPITAL_COMMUNITY)
Admission: RE | Admit: 2021-12-05 | Discharge: 2021-12-05 | Disposition: A | Discharge status: Home / Self Care | Payer: Medicaid Other | Source: Ambulatory Visit | Attending: Family Medicine | Admitting: Family Medicine

## 2021-12-05 ENCOUNTER — Inpatient Hospital Stay (HOSPITAL_COMMUNITY)
Admission: AD | Admit: 2021-12-05 | Discharge: 2021-12-05 | Disposition: A | Discharge status: Home / Self Care | Attending: Obstetrics & Gynecology | Admitting: Obstetrics & Gynecology

## 2021-12-05 ENCOUNTER — Ambulatory Visit (INDEPENDENT_AMBULATORY_CARE_PROVIDER_SITE_OTHER): Payer: Medicaid Other | Admitting: Family Medicine

## 2021-12-05 ENCOUNTER — Encounter (HOSPITAL_COMMUNITY): Payer: Self-pay | Admitting: Obstetrics & Gynecology

## 2021-12-05 ENCOUNTER — Telehealth: Payer: Self-pay

## 2021-12-05 ENCOUNTER — Other Ambulatory Visit: Payer: Self-pay

## 2021-12-05 VITALS — BP 126/87 | HR 64 | Wt 140.0 lb

## 2021-12-05 DIAGNOSIS — O0991 Supervision of high risk pregnancy, unspecified, first trimester: Secondary | ICD-10-CM

## 2021-12-05 DIAGNOSIS — Z6790 Unspecified blood type, Rh positive: Secondary | ICD-10-CM

## 2021-12-05 DIAGNOSIS — O26899 Other specified pregnancy related conditions, unspecified trimester: Secondary | ICD-10-CM

## 2021-12-05 DIAGNOSIS — O021 Missed abortion: Secondary | ICD-10-CM | POA: Insufficient documentation

## 2021-12-05 DIAGNOSIS — R569 Unspecified convulsions: Secondary | ICD-10-CM | POA: Insufficient documentation

## 2021-12-05 DIAGNOSIS — O99341 Other mental disorders complicating pregnancy, first trimester: Secondary | ICD-10-CM | POA: Diagnosis not present

## 2021-12-05 DIAGNOSIS — I10 Essential (primary) hypertension: Secondary | ICD-10-CM

## 2021-12-05 DIAGNOSIS — Z3A12 12 weeks gestation of pregnancy: Secondary | ICD-10-CM | POA: Diagnosis not present

## 2021-12-05 DIAGNOSIS — M25511 Pain in right shoulder: Secondary | ICD-10-CM | POA: Insufficient documentation

## 2021-12-05 DIAGNOSIS — R109 Unspecified abdominal pain: Secondary | ICD-10-CM

## 2021-12-05 DIAGNOSIS — F1911 Other psychoactive substance abuse, in remission: Secondary | ICD-10-CM

## 2021-12-05 DIAGNOSIS — O09522 Supervision of elderly multigravida, second trimester: Secondary | ICD-10-CM

## 2021-12-05 DIAGNOSIS — O99011 Anemia complicating pregnancy, first trimester: Secondary | ICD-10-CM | POA: Diagnosis not present

## 2021-12-05 DIAGNOSIS — Z8619 Personal history of other infectious and parasitic diseases: Secondary | ICD-10-CM

## 2021-12-05 DIAGNOSIS — O10911 Unspecified pre-existing hypertension complicating pregnancy, first trimester: Secondary | ICD-10-CM | POA: Insufficient documentation

## 2021-12-05 DIAGNOSIS — O09521 Supervision of elderly multigravida, first trimester: Secondary | ICD-10-CM

## 2021-12-05 DIAGNOSIS — O209 Hemorrhage in early pregnancy, unspecified: Secondary | ICD-10-CM

## 2021-12-05 DIAGNOSIS — O26891 Other specified pregnancy related conditions, first trimester: Secondary | ICD-10-CM

## 2021-12-05 LAB — URINALYSIS, ROUTINE W REFLEX MICROSCOPIC
Bilirubin Urine: NEGATIVE
Glucose, UA: NEGATIVE mg/dL
Hgb urine dipstick: NEGATIVE
Ketones, ur: NEGATIVE mg/dL
Leukocytes,Ua: NEGATIVE
Nitrite: NEGATIVE
Protein, ur: NEGATIVE mg/dL
Specific Gravity, Urine: 1.018 (ref 1.005–1.030)
pH: 6 (ref 5.0–8.0)

## 2021-12-05 LAB — CBC
HCT: 33.9 % — ABNORMAL LOW (ref 36.0–46.0)
Hemoglobin: 12 g/dL (ref 12.0–15.0)
MCH: 31.7 pg (ref 26.0–34.0)
MCHC: 35.4 g/dL (ref 30.0–36.0)
MCV: 89.4 fL (ref 80.0–100.0)
Platelets: 173 K/uL (ref 150–400)
RBC: 3.79 MIL/uL — ABNORMAL LOW (ref 3.87–5.11)
RDW: 14.4 % (ref 11.5–15.5)
WBC: 10 K/uL (ref 4.0–10.5)
nRBC: 0 % (ref 0.0–0.2)

## 2021-12-05 LAB — HCG, QUANTITATIVE, PREGNANCY: hCG, Beta Chain, Quant, S: 4388 m[IU]/mL — ABNORMAL HIGH (ref <5)

## 2021-12-05 MED ORDER — ONDANSETRON 4 MG PO TBDP
4.0000 mg | ORAL_TABLET | Freq: Three times a day (TID) | ORAL | Status: DC | PRN
  Administered 2021-12-05: 4 mg via ORAL
  Filled 2021-12-05: qty 1

## 2021-12-05 MED ORDER — ASPIRIN 81 MG PO TBEC: 81.0000 mg | DELAYED_RELEASE_TABLET | Freq: Every day | ORAL | 12 refills | Status: DC

## 2021-12-05 MED ORDER — KETOROLAC TROMETHAMINE 30 MG/ML IJ SOLN
30.0000 mg | Freq: Once | INTRAMUSCULAR | Status: AC
Start: 2021-12-05 — End: 2021-12-05
  Administered 2021-12-05: 30 mg via INTRAMUSCULAR
  Filled 2021-12-05: qty 1

## 2021-12-05 MED ORDER — ONDANSETRON 4 MG PO TBDP: 4.0000 mg | ORAL_TABLET | Freq: Three times a day (TID) | ORAL | 0 refills | Status: AC | PRN

## 2021-12-05 MED ORDER — IBUPROFEN 600 MG PO TABS: 600.0000 mg | ORAL_TABLET | Freq: Four times a day (QID) | ORAL | 0 refills | Status: DC | PRN

## 2021-12-05 MED ORDER — ACETAMINOPHEN 325 MG PO TABS: 650.0000 mg | ORAL_TABLET | Freq: Four times a day (QID) | ORAL | 0 refills | Status: AC | PRN

## 2021-12-05 NOTE — MAU Provider Note (Signed)
History     CSN: [id number]  Arrival date and time: 12/05/21 1130   Event Date/Time   First Provider Initiated Contact with Patient 12/05/21 1230      Chief Complaint  Patient presents with   Vaginal Bleeding   40 y.o. [id number] @[redacted]w[redacted]d  with confirmed IUP sent from office for no FHTs. She reports 4 days of heavy vaginal bleeding and passing clots last week. Bleeding is small now. Reports mild low abdominal cramping. Rates pain 9/10. Has not taken anything for it. Also reports hx of seizures and reports one last week. After the seizure she had pain and swelling in her right shoulder and collar bone. Of note she is accompanied by 11/10 where she is currently in custody.   OB History     Gravida  17   Para  9   Term  8   Preterm  1   AB  7   Living  10      SAB  7   IAB  0   Ectopic  0   Multiple  1   Live Births  10           Past Medical History:  Diagnosis Date   Anemia    Bipolar 1 disorder (HCC)    Chlamydia    Chronic abdominal pain    Chronic nausea    Deliberate self-cutting    History of substance abuse (HCC)    HSV-2 infection 2015   Hypertension    Infection    MRSA in 1995, negative since   Lupus (HCC)    Schizophrenia (HCC)    Seizures (HCC)    Not recently   Sickle cell trait (HCC)    Trichomonas infection     Past Surgical History:  Procedure Laterality Date   CESAREAN SECTION N/A 03/21/2017   Procedure: CESAREAN SECTION;  Surgeon: 03/23/2017, MD;  Location: [facility] BIRTHING SUITES;  Service: Obstetrics;  Laterality: N/A;   DILATION AND CURETTAGE OF UTERUS     HEMORROIDECTOMY  2010   HERNIA REPAIR     plastic surgery on face     UMBILICAL HERNIA REPAIR N/A 12/02/2017   Procedure: LAPAROSCOPIC EXCISION OF MESH ERAS PATHWAY;  Surgeon: Kinsinger, 12/04/2017, MD;  Location: [facility] OR[facility];  Service: General;  Laterality: N/A;    Family History  Problem Relation Age of Onset   Cancer Mother    Heart  failure Mother    Hypertension Mother    Stroke Mother    Diabetes Mother    Healthy Father    Hypertension Maternal Grandmother    Anesthesia problems Neg Hx     Social History   Tobacco Use   Smoking status: Former    Packs/day: 0.50    Years: 1.50    Total pack years: 0.75    Types: Cigarettes    Quit date: 04/09/2015    Years since quitting: 6.6   Smokeless tobacco: Never  Vaping Use   Vaping Use: Never used  Substance Use Topics   Alcohol use: No    Comment: hx drug use   Drug use: No    Types: Marijuana, Cocaine    Comment: past use, 4 years ago, edibles 03/2019    Allergies:  Allergies  Allergen Reactions   Peanut-Containing Drug Products Anaphylaxis, Itching and Rash   Amoxicillin Hives, Swelling and Other (See Comments)    Has patient had a PCN reaction causing immediate rash, facial/tongue/throat swelling, SOB or lightheadedness with  hypotension: No Has patient had a PCN reaction causing severe rash involving mucus membranes or skin necrosis: No Has patient had a PCN reaction that required hospitalization No Has patient had a PCN reaction occurring within the last 10 years: Yes If all of the above answers are "NO", then may proceed with Cephalosporin use.   Latex Hives and Itching    Medications Prior to Admission  Medication Sig Dispense Refill Last Dose   Ondansetron HCl (ZOFRAN PO) Take by mouth.   12/04/2021   Prenatal Vit-Fe Fumarate-FA (PRENATAL PO) Take by mouth.   12/04/2021   acetaminophen (TYLENOL) 325 MG tablet Take 2 tablets (650 mg total) by mouth every 6 (six) hours as needed for mild pain. 30 tablet 0    ARIPiprazole (ABILIFY) 5 MG tablet Take 5 mg by mouth at bedtime.      aspirin EC 81 MG tablet Take 1 tablet (81 mg total) by mouth daily. Swallow whole. 30 tablet 12    ketorolac (TORADOL) 10 MG tablet Take 10 mg by mouth 4 (four) times daily as needed (For pain). (Patient not taking: Reported on 10/29/2021)      SUBOXONE 8-2 MG FILM Place 1  strip under the tongue daily. (Patient not taking: Reported on 10/29/2021)      triamcinolone cream (KENALOG) 0.1 % Apply 1 application topically 2 (two) times daily. (Patient not taking: Reported on 10/29/2021) 80 g 0     Review of Systems  Constitutional:  Negative for fever.  Gastrointestinal:  Positive for abdominal pain.  Genitourinary:  Positive for vaginal bleeding.   Physical Exam   Blood pressure 135/82, pulse 71, temperature 98.7 F (37.1 C), temperature source Oral, resp. rate 20, height 5\' 3"  (1.6 m), weight 63.7 kg, SpO2 100 %.  Physical Exam Vitals and nursing note reviewed. Exam conducted with a chaperone present.  Constitutional:      General: She is not in acute distress.    Appearance: Normal appearance.  HENT:     Head: Normocephalic and atraumatic.  Cardiovascular:     Rate and Rhythm: Normal rate.  Pulmonary:     Effort: Pulmonary effort is normal. No respiratory distress.  Abdominal:     General: There is no distension.     Palpations: Abdomen is soft. There is no mass.     Tenderness: There is no abdominal tenderness. There is no guarding or rebound.     Hernia: No hernia is present.  Musculoskeletal:     Right shoulder: No swelling or deformity. Decreased range of motion.     Cervical back: Normal range of motion.  Skin:    General: Skin is warm and dry.  Neurological:     General: No focal deficit present.     Mental Status: She is alert and oriented to person, place, and time.  Psychiatric:        Mood and Affect: Mood normal.        Behavior: Behavior normal.    Results for orders placed or performed during the hospital encounter of 12/05/21 (from the past 24 hour(s))  CBC     Status: Abnormal   Collection Time: 12/05/21 12:40 PM  Result Value Ref Range   WBC 10.0 4.0 - 10.5 K/uL   RBC 3.79 (L) 3.87 - 5.11 MIL/uL   Hemoglobin 12.0 12.0 - 15.0 g/dL   HCT 12/07/21 (L) 95.6 - 38.7 %   MCV 89.4 80.0 - 100.0 fL   MCH 31.7 26.0 - 34.0 pg  MCHC 35.4  30.0 - 36.0 g/dL   RDW 79.1 50.5 - 69.7 %   Platelets 173 150 - 400 K/uL   nRBC 0.0 0.0 - 0.2 %  hCG, quantitative, pregnancy     Status: Abnormal   Collection Time: 12/05/21 12:40 PM  Result Value Ref Range   hCG, Beta Chain, Quant, S 4,388 (H) <5 mIU/mL  Urinalysis, Routine w reflex microscopic Urine, Clean Catch     Status: None   Collection Time: 12/05/21 12:46 PM  Result Value Ref Range   Color, Urine YELLOW YELLOW   APPearance CLEAR CLEAR   Specific Gravity, Urine 1.018 1.005 - 1.030   pH 6.0 5.0 - 8.0   Glucose, UA NEGATIVE NEGATIVE mg/dL   Hgb urine dipstick NEGATIVE NEGATIVE   Bilirubin Urine NEGATIVE NEGATIVE   Ketones, ur NEGATIVE NEGATIVE mg/dL   Protein, ur NEGATIVE NEGATIVE mg/dL   Nitrite NEGATIVE NEGATIVE   Leukocytes,Ua NEGATIVE NEGATIVE   DG Shoulder Right  Result Date: 12/05/2021 CLINICAL DATA:  Shoulder pain with limited abduction after falling 1 week ago. EXAM: RIGHT SHOULDER - 2+ VIEW COMPARISON:  Radiographs 08/16/2014 FINDINGS: The mineralization and alignment are normal. There is no evidence of acute fracture or dislocation. The joint spaces are preserved. No significant narrowing of the subacromial space. The soft tissues appear unremarkable. IMPRESSION: Unremarkable right shoulder radiographs. Electronically Signed   By: Carey Bullocks M.D.   On: 12/05/2021 15:30   US OB Comp Less 14 Wks  Result Date: 12/05/2021 CLINICAL DATA:  Heavy vaginal bleeding EXAM: OBSTETRIC <14 WK ULTRASOUND TECHNIQUE: Transabdominal ultrasound was performed for evaluation of the gestation as well as the maternal uterus and adnexal regions. COMPARISON:  10/29/2021 FINDINGS: Intrauterine gestational sac: Single Yolk sac:  Not Visualized. Embryo:  Visualized. Cardiac Activity: Not Visualized. Heart Rate: Not visualized. CRL:   22.2 mm   8 w 6 d                  [place] EDC: 07/11/2022 Subchorionic hemorrhage:  None visualized. Maternal uterus/adnexae: Unremarkable. IMPRESSION: Single  intrauterine gestation at sonographic gestational age of [redacted] weeks, 6 days. No fetal cardiac activity is identified on today's examination, with fetal cardiac activity previously documented on 10/29/2021. Findings are consistent with fetal demise. Electronically Signed   By: Jearld Lesch M.D.   On: 12/05/2021 13:33    MAU Course  Procedures Toradol Zofran  MDM Labs and imaging ordered and reviewed. No signs of injury or fracture to shoulder. MAB identified on [place]. Consult with Dr. Vergie Living. Plan for D&C to be scheduled outpatient. Discussed findings with pt, condolences offered. Stable for discharge.   Assessment and Plan   1. Missed abortion   2. Blood type, Rh positive   3. Acute pain of right shoulder    Discharge to [employer] custody Follow up for outpt surgery Rx Zofran Rx Ibuprofen  Allergies as of 12/05/2021       Reactions   Peanut-containing Drug Products Anaphylaxis, Itching, Rash   Amoxicillin Hives, Swelling, Other (See Comments)   Has patient had a PCN reaction causing immediate rash, facial/tongue/throat swelling, SOB or lightheadedness with hypotension: No Has patient had a PCN reaction causing severe rash involving mucus membranes or skin necrosis: No Has patient had a PCN reaction that required hospitalization No Has patient had a PCN reaction occurring within the last 10 years: Yes If all of the above answers are "NO", then may proceed with Cephalosporin use.   Latex Hives, Itching  Medication List     STOP taking these medications    aspirin EC 81 MG tablet   ketorolac 10 MG tablet Commonly known as: TORADOL   PRENATAL PO   Suboxone 8-2 MG Film Generic drug: Buprenorphine HCl-Naloxone HCl   triamcinolone cream 0.1 % Commonly known as: KENALOG   ZOFRAN PO       TAKE these medications    acetaminophen 325 MG tablet Commonly known as: Tylenol Take 2 tablets (650 mg total) by mouth every 6 (six) hours as needed for mild pain.   ARIPiprazole  5 MG tablet Commonly known as: ABILIFY Take 5 mg by mouth at bedtime.   ibuprofen 600 MG tablet Commonly known as: ADVIL Take 1 tablet (600 mg total) by mouth every 6 (six) hours as needed.   ondansetron 4 MG disintegrating tablet Commonly known as: ZOFRAN-ODT Take 1 tablet (4 mg total) by mouth every 8 (eight) hours as needed for nausea or vomiting.        Donette Larry, CNM 12/05/2021, 12:40 PM

## 2021-12-05 NOTE — Telephone Encounter (Signed)
Called and spoke to Artis Delay, [place] at [facility] at [phone], surgery date, time, location and preop instructions given.

## 2021-12-05 NOTE — MAU Note (Signed)
[name] is a 41 y.o. at [redacted]w[redacted]d here in MAU reporting: sent from clinic for VB and no heartbeat, reports had heavy menstrual type VB with clots last week.  States also having lower abdominal cramping LMP: N/A Onset of complaint: last week Pain score: 9 Vitals:   12/05/21 1205  BP: 129/80  Pulse: 67  Resp: 20  Temp: 98.7 F (37.1 C)  SpO2: 100%     FHT:N/A Lab orders placed from triage:   UA

## 2021-12-05 NOTE — Progress Notes (Signed)
PRENATAL VISIT NOTE  Subjective:  [name] is a 41 y.o. [id number] at Unknown being seen today for her first prenatal visit for this pregnancy.  She is currently monitored for the following issues for this high-risk pregnancy and has Tobacco abuse; Bipolar disorder (HCC); History of substance abuse (HCC); Sickle cell trait (HCC); Chronic hypertension; History of herpes genitalis; Advanced maternal age in multigravida, second trimester; Grand multiparity; Opiate use; Penicillin allergy; Hereditary disease in family possibly affecting fetus, fetus 1; Umbilical hernia; GERD (gastroesophageal reflux disease); Polysubstance abuse (HCC); Psychoactive substance-induced mood disorder (HCC); and Involuntary commitment on their problem list.  Patient reports bleeding, vaginal irritation, and cramping . She has been passing clots and bright red blood for the past few weeks. Denies passage of tissue. She is concerned for a miscarriage. Bleeding has slowed down.  She is planning to breastfeed. Declines contraception.   The following portions of the patient's history were reviewed and updated as appropriate: allergies, current medications, past family history, past medical history, past social history, past surgical history and problem list.   Objective:   Vitals:   12/05/21 1001  BP: 126/87  Pulse: 64  Weight: 140 lb (63.5 kg)    Fetal Status:unable to obtain fetal heart tones with doppler or POCUS  General:  Alert, oriented and cooperative. Patient is in no acute distress.  Skin: Skin is warm and dry. No rash noted.   Cardiovascular: Normal heart rate and rhythm noted  Respiratory: Normal respiratory effort, no problems with respiration noted. Clear to auscultation.   Abdomen: Soft, gravid, appropriate for gestational age. Normal bowel sounds. Non-tender.       Pelvic: Cervical exam performed       Normal cervical contour, no lesions, + bleeding  and blood tinged discharge from cervix, no  CMT  Extremities: Normal range of motion.     Mental Status: Normal mood and affect. Normal behavior. Normal judgment and thought content.    Indications for ASA therapy (per uptodate) One of the following: Previous pregnancy with preeclampsia, especially early onset and with an adverse outcome Yes Multifetal gestation No Chronic hypertension Yes Type 1 or 2 diabetes mellitus No Chronic kidney disease No Autoimmune disease (antiphospholipid syndrome, systemic lupus erythematosus) Yes  Two or more of the following: Nulliparity No Obesity (body mass index >30 kg/m2) No Family history of preeclampsia in mother or sister Yes Age ?35 years Yes Sociodemographic characteristics (African American race, low socioeconomic level) Yes Personal risk factors (eg, previous pregnancy with low birth weight or small for gestational age infant, previous adverse pregnancy outcome [eg, stillbirth], interval >10 years between pregnancies) Yes  Indications for early GDM screening  First-degree relative with diabetes Yes BMI >30kg/m2 No Age > 35 Yes Previous birth of an infant weighing ?4000 g No Gestational diabetes mellitus in a previous pregnancy Yes Glycated hemoglobin ?5.7 percent (39 mmol/mol), impaired glucose tolerance, or impaired fasting glucose on previous testing Yes High-risk race/ethnicity (eg, African American, Latino, Native American, [place] American, Pacific Islander) Yes Previous stillbirth of unknown cause No Maternal birthweight > 9 lbs No History of cardiovascular disease No Hypertension or on therapy for hypertension No High-density lipoprotein cholesterol level <35 mg/dL (7.67 mmol/L) and/or a triglyceride level >250 mg/dL (3.41 mmol/L) No Polycystic ovary syndrome No Physical inactivity No Other clinical condition associated with insulin resistance (eg, severe obesity, acanthosis nigricans) No Current use of glucocorticoids No  Early screening tests: FBS, A1C, Random CBG,  glucose challenge  Assessment and Plan:  Pregnancy:  [id number] at [redacted]w[redacted]d per [place] 1. Supervision of high risk pregnancy in first trimester  - Cytology - PAP( [place]) - Cervicovaginal ancillary only( [place]) - Culture, OB Urine - CBC/D/Plt+RPR+Rh+ABO+RubIgG... - Hemoglobin A1c - Panorama Prenatal Test Full Panel - [place] Custom - [place] MFM OB DETAIL +14 WK; Future - aspirin EC 81 MG tablet; Take 1 tablet (81 mg total) by mouth daily. Swallow whole.  Dispense: 30 tablet; Refill: 12 - Urinalysis, Routine w reflex microscopic - acetaminophen (TYLENOL) 325 MG tablet; Take 2 tablets (650 mg total) by mouth every 6 (six) hours as needed for mild pain.  Dispense: 30 tablet; Refill: 0  2. Advanced maternal age in multigravida, second trimester  - aspirin EC 81 MG tablet; Take 1 tablet (81 mg total) by mouth daily. Swallow whole.  Dispense: 30 tablet; Refill: 12  3. Chronic hypertension BP well controlled today. Monitor BP. Pt cannot use babyRx 2/2 incarceration. She will need her BP monitored regularly by medical staff in the facility she is located.  - aspirin EC 81 MG tablet; Take 1 tablet (81 mg total) by mouth daily. Swallow whole.  Dispense: 30 tablet; Refill: 12  4. History of herpes genitalis Suppression at 34 wks  5. History of substance abuse (HCC) UDS collected  6. Vaginal bleeding affecting early pregnancy 7. Cramping affecting pregnancy, antepartum -- No FHT on today's encounter. + bleeding noted on speculum exam. Given pt bleeding and cramping sx, will send to MAU for evaluation of early pregnancy loss.   Preterm labor/first trimester warning symptoms and general obstetric precautions including but not limited to vaginal bleeding, contractions, leaking of fluid and fetal movement were reviewed in detail with the patient. Please refer to After Visit Summary for other counseling recommendations.   Discussed the normal visit cadence for prenatal care Discussed the  nature of our practice with multiple providers including residents and students   Return in about 4 weeks (around 01/02/2022) for ROB.  Future Appointments  Date Time Provider Department Center  01/04/2022  9:55 AM Katrinka Blazing, [place], CNM [facility]    Myrtie Hawk, DO

## 2021-12-05 NOTE — Patient Instructions (Signed)

## 2021-12-06 ENCOUNTER — Encounter (HOSPITAL_COMMUNITY): Payer: Self-pay | Admitting: Obstetrics & Gynecology

## 2021-12-06 ENCOUNTER — Other Ambulatory Visit: Payer: Self-pay

## 2021-12-06 LAB — CERVICOVAGINAL ANCILLARY ONLY
Bacterial Vaginitis (gardnerella): POSITIVE — AB
Candida Glabrata: NEGATIVE
Candida Vaginitis: NEGATIVE
Chlamydia: NEGATIVE
Comment: NEGATIVE
Comment: NEGATIVE
Comment: NEGATIVE
Comment: NEGATIVE
Comment: NEGATIVE
Comment: NORMAL
Neisseria Gonorrhea: NEGATIVE
Trichomonas: NEGATIVE

## 2021-12-06 LAB — URINALYSIS, ROUTINE W REFLEX MICROSCOPIC
Bilirubin, UA: NEGATIVE
Glucose, UA: NEGATIVE
Ketones, UA: NEGATIVE
Nitrite, UA: NEGATIVE
Specific Gravity, UA: 1.019 (ref 1.005–1.030)
Urobilinogen, Ur: 0.2 mg/dL (ref 0.2–1.0)
pH, UA: 7 (ref 5.0–7.5)

## 2021-12-06 LAB — MICROSCOPIC EXAMINATION
Casts: NONE SEEN /lpf
RBC, Urine: >30 /hpf — AB (ref 0–2)

## 2021-12-06 NOTE — Progress Notes (Signed)
Anesthesia Chart Review: [name]  Case: [id number] Date/Time: 12/07/21 1045   Procedure: DILATATION AND EVACUATION   Anesthesia type: Choice   Pre-op diagnosis: MAB   [facility] ROOM 04 / [facility]   Surgeons: Adam Phenix, MD       DISCUSSION: Patient is a 41 year old female scheduled for the above procedure. She current resides at the [facility] [phone]).   History includes former smoker (quit 04/09/15), HTN, Sickle cell triat, Bipolar 1 disorder, anemia, chronic nausea, lupus, GERD, umbilical hernia repair (with excision for underlay mesh 12/02/17 for persistent pain). History of substance abuse negative drug screen 10/29/21; + [facility] 08/02/21).   ED visit 10/29/21 for headaches, dysarthria. CT head negative, and CTA head/neck negative. Neurologist Ritta Slot, MD consulted and wrote, "signs and symptoms most consistent with nonphysiological process.  He is complaining of a headache, and it is possible that she has some degree of complicated migraine with psychological overlay.  I think it would be reasonable to try treating for complicated migraine, and if her symptoms resolve then I do not think any further work-up would be needed...." Had + b-HCG test.   She had first prenatal visit on 12/05/21 with Patrcia Dolly, DO. She reported passing some clots of blood and was concerned about a miscarriage. She was sent to MAU for evaluation. 12/05/21 US showed single intrauterine gestations of 8w 6d, but without cardiac activity, new from 10/29/21. Findings consistent with fetal demise.   Anesthesia team to evaluate on the day of procedure.   VS:  BP Readings from Last 3 Encounters:  12/05/21 126/87  10/29/21 120/84  12/22/20 135/85   Pulse Readings from Last 3 Encounters:  12/05/21 64  10/29/21 73  12/22/20 84     PROVIDERS: Patient, No Pcp Per   LABS: Most recent results in [facility] include: Lab Results  Component Value Date   WBC 10.0  12/05/2021   HGB 12.0 12/05/2021   HCT 33.9 (L) 12/05/2021   PLT 173 12/05/2021   GLUCOSE 89 10/29/2021   ALT 11 10/29/2021   AST 14 (L) 10/29/2021   NA 134 (L) 10/29/2021   K 3.7 10/29/2021   CL 104 10/29/2021   CREATININE 0.50 10/29/2021   BUN 7 10/29/2021   CO2 18 (L) 10/29/2021   TSH 1.256 10/29/2021   INR 1.0 10/29/2021   HGBA1C <4.2 (L) 08/02/2021     IMAGES: Xray right shoulder 12/05/21: IMPRESSION: Unremarkable right shoulder radiographs.  CTA Head/Neck 10/29/21: IMPRESSION: 1.  No intracranial large vessel occlusion or significant stenosis. 2.  No hemodynamically significant stenosis in the neck.  CT Head 10/29/21: IMPRESSION: 1. No acute intracranial process. 2. ASPECTS is 10   EKG: 10/29/21: Sinus rhythm Probable left ventricular hypertrophy No significant change was found Confirmed by Glynn Octave [phone]) on 10/29/2021 8:40:11 PM   CV: N/A  Past Medical History:  Diagnosis Date   Anemia    Bipolar 1 disorder (HCC)    Chlamydia    Chronic abdominal pain    Chronic nausea    Deliberate self-cutting    GERD (gastroesophageal reflux disease)    History of substance abuse (HCC)    HSV-2 infection 2015   Hypertension    Infection    MRSA in 1995, negative since   Lupus (HCC)    Schizophrenia (HCC)    Seizures (HCC)    Not recently   Sickle cell trait (HCC)    Trichomonas infection  Past Surgical History:  Procedure Laterality Date   CESAREAN SECTION N/A 03/21/2017   Procedure: CESAREAN SECTION;  Surgeon: Selma Bing, MD;  Location: [facility] BIRTHING SUITES;  Service: Obstetrics;  Laterality: N/A;   DILATION AND CURETTAGE OF UTERUS     HEMORROIDECTOMY  2010   HERNIA REPAIR     plastic surgery on face     UMBILICAL HERNIA REPAIR N/A 12/02/2017   Procedure: LAPAROSCOPIC EXCISION OF MESH ERAS PATHWAY;  Surgeon: Kinsinger, De Blanch, MD;  Location: [facility];  Service: General;  Laterality: N/A;    MEDICATIONS: No current  facility-administered medications for this encounter.    acetaminophen (TYLENOL) 325 MG tablet   ARIPiprazole (ABILIFY) 5 MG tablet   ibuprofen (ADVIL) 600 MG tablet   ondansetron (ZOFRAN-ODT) 4 MG disintegrating tablet    Shonna Chock, PA-C Surgical Short Stay/Anesthesiology [facility] Phone [phone] [facility] Phone [phone] 12/06/2021 2:26 PM

## 2021-12-06 NOTE — Progress Notes (Addendum)
Anesthesia Review:  PCP: Cardiologist : Chest x-ray : EKG : 10/29/21  Echo : Stress test: Cardiac Cath :  Activity level:  Sleep Study/ CPAP : Fasting Blood Sugar :      / Checks Blood Sugar -- times a day:   Blood Thinner/ Instructions /Last Dose: ASA / Instructions/ Last Dose :   PT is currently an inmate at [facility].  Phone number [phone].  Spoke with Wetzel Bjornstad, AA at [facility] and she confirmed pt is an inmate. There.  Deone is also aware that a nurse tech spoke with NP at jail yesterday and is aware pt to arrive at 0830am.  NPO after midnite.  NO jewelry, lotions, powders, perfumes or deodorant.  Deone will have nurse at [facility] give preop nurse a call bck at [phone].   Called and LVMM for Kathlyn [place] at office of DR Debroah Loop and requested orders.   There is  a note in epic that states that [place] spoke with PA at [facility] and reviewed preop instructions.   12/05/21- cbc, u/A, serum preg  Called and LVMM at [phone] for someone to call preop nurse at [facility] in regards to pt.   Called back again on 12/06/21 at 1315pm and spoke with Cay Schillings, RN and reviewed preop instructions for 12/07/21.  RN states that their system has been down and that is the reason she has not called back.  RN able to confirm pt's DOB and to answer basic hx questions to the best of her knowledge.  Completed med hx and preop instructions with RN at [facility].  Office to accompany pt to hospital and to be here.  .  They are aware pt is to have shower in am and to wear clean clothes.  Nurse at [facility] voiced understanding.  Phone number for [facility] is [phone].   Nurse at [facility] aware to send with pt - med hx, current med list and allergies and any other pertinent info.  Nurse voiced understanding.   CBC u/a and serum preg done on 12/05/21- in epic.

## 2021-12-06 NOTE — Anesthesia Preprocedure Evaluation (Addendum)
Anesthesia Evaluation  Patient identified by MRN, date of birth, ID band Patient awake    Reviewed: Allergy & Precautions, NPO status , Patient's Chart, lab work & pertinent test results  History of Anesthesia Complications Negative for: history of anesthetic complications  Airway Mallampati: I  TM Distance: >3 FB Neck ROM: Full    Dental  (+) Edentulous Upper, Edentulous Lower, Dental Advisory Given   Pulmonary former smoker,    breath sounds clear to auscultation       Cardiovascular hypertension,  Rhythm:Regular     Neuro/Psych Seizures -,  PSYCHIATRIC DISORDERS Anxiety Depression Bipolar Disorder Schizophrenia    GI/Hepatic   Endo/Other  negative endocrine ROS  Renal/GU negative Renal ROS     Musculoskeletal negative musculoskeletal ROS (+)   Abdominal   Peds  Hematology  (+) Blood dyscrasia, Sickle cell trait , Lab Results      Component                Value               Date                      WBC                      10.0                12/05/2021                HGB                      12.0                12/05/2021                HCT                      33.9 (L)            12/05/2021                MCV                      89.4                12/05/2021                PLT                      173                 12/05/2021              Anesthesia Other Findings   Reproductive/Obstetrics Missed ab                            Anesthesia Physical Anesthesia Plan  ASA: 2  Anesthesia Plan: General   Post-op Pain Management: Ofirmev IV (intra-op)* and Toradol IV (intra-op)*   Induction: Intravenous  PONV Risk Score and Plan: 3 and Ondansetron, Dexamethasone, Propofol infusion, TIVA and Midazolam  Airway Management Planned: LMA  Additional Equipment: None  Intra-op Plan:   Post-operative Plan: Extubation in OR  Informed Consent: I have reviewed the patients History  and Physical, chart, labs and discussed the procedure including the risks, benefits and alternatives for the proposed anesthesia with the patient  or authorized representative who has indicated his/her understanding and acceptance.     Dental advisory given  Plan Discussed with: CRNA  Anesthesia Plan Comments: (PAT note written 12/06/2021 by Shonna Chock, PA-C. )       Anesthesia Quick Evaluation

## 2021-12-07 ENCOUNTER — Encounter (HOSPITAL_COMMUNITY)
Admission: RE | Disposition: A | Discharge status: Home / Self Care | Payer: Self-pay | Source: Home / Self Care | Attending: Obstetrics & Gynecology

## 2021-12-07 ENCOUNTER — Ambulatory Visit (HOSPITAL_COMMUNITY)
Admission: RE | Admit: 2021-12-07 | Discharge: 2021-12-07 | Disposition: A | Discharge status: Home / Self Care | Payer: Medicaid Other | Attending: Obstetrics & Gynecology | Admitting: Obstetrics & Gynecology

## 2021-12-07 ENCOUNTER — Other Ambulatory Visit: Payer: Self-pay

## 2021-12-07 ENCOUNTER — Ambulatory Visit (HOSPITAL_BASED_OUTPATIENT_CLINIC_OR_DEPARTMENT_OTHER): Payer: Medicaid Other | Admitting: Vascular Surgery

## 2021-12-07 ENCOUNTER — Encounter (HOSPITAL_COMMUNITY): Payer: Self-pay | Admitting: Obstetrics & Gynecology

## 2021-12-07 ENCOUNTER — Ambulatory Visit (HOSPITAL_COMMUNITY): Payer: Medicaid Other | Admitting: Vascular Surgery

## 2021-12-07 DIAGNOSIS — F419 Anxiety disorder, unspecified: Secondary | ICD-10-CM | POA: Diagnosis not present

## 2021-12-07 DIAGNOSIS — Z87891 Personal history of nicotine dependence: Secondary | ICD-10-CM | POA: Insufficient documentation

## 2021-12-07 DIAGNOSIS — M329 Systemic lupus erythematosus, unspecified: Secondary | ICD-10-CM | POA: Diagnosis not present

## 2021-12-07 DIAGNOSIS — O021 Missed abortion: Secondary | ICD-10-CM

## 2021-12-07 DIAGNOSIS — F319 Bipolar disorder, unspecified: Secondary | ICD-10-CM | POA: Diagnosis not present

## 2021-12-07 DIAGNOSIS — F209 Schizophrenia, unspecified: Secondary | ICD-10-CM | POA: Insufficient documentation

## 2021-12-07 HISTORY — DX: Depression, unspecified: F32.A

## 2021-12-07 HISTORY — DX: Anxiety disorder, unspecified: F41.9

## 2021-12-07 HISTORY — PX: DILATION AND EVACUATION: SHX1459

## 2021-12-07 HISTORY — DX: Gastro-esophageal reflux disease without esophagitis: K21.9

## 2021-12-07 LAB — CBC/D/PLT+RPR+RH+ABO+RUBIGG...
Antibody Screen: NEGATIVE
Basophils Absolute: 0 10*3/uL (ref 0.0–0.2)
Basos: 0 %
EOS (ABSOLUTE): 0.1 10*3/uL (ref 0.0–0.4)
Eos: 1 %
HCV Ab: NONREACTIVE
HIV Screen 4th Generation wRfx: NONREACTIVE
Hematocrit: 35.9 % (ref 34.0–46.6)
Hemoglobin: 12.2 g/dL (ref 11.1–15.9)
Hepatitis B Surface Ag: NEGATIVE
Immature Grans (Abs): 0 10*3/uL (ref 0.0–0.1)
Immature Granulocytes: 0 %
Lymphocytes Absolute: 2 10*3/uL (ref 0.7–3.1)
Lymphs: 22 %
MCH: 31.5 pg (ref 26.6–33.0)
MCHC: 34 g/dL (ref 31.5–35.7)
MCV: 93 fL (ref 79–97)
Monocytes Absolute: 0.6 10*3/uL (ref 0.1–0.9)
Monocytes: 7 %
Neutrophils Absolute: 6.3 10*3/uL (ref 1.4–7.0)
Neutrophils: 70 %
Platelets: 177 10*3/uL (ref 150–450)
RBC: 3.87 x10E6/uL (ref 3.77–5.28)
RDW: 14.3 % (ref 11.7–15.4)
RPR Ser Ql: NONREACTIVE
Rh Factor: POSITIVE
Rubella Antibodies, IGG: 1.51 index (ref >0.99)
WBC: 9 10*3/uL (ref 3.4–10.8)

## 2021-12-07 LAB — BASIC METABOLIC PANEL
Anion gap: 6 (ref 5–15)
BUN: 6 mg/dL (ref 6–20)
CO2: 22 mmol/L (ref 22–32)
Calcium: 8.6 mg/dL — ABNORMAL LOW (ref 8.9–10.3)
Chloride: 110 mmol/L (ref 98–111)
Creatinine, Ser: 0.74 mg/dL (ref 0.44–1.00)
GFR, Estimated: >60 mL/min (ref >60)
Glucose, Bld: 79 mg/dL (ref 70–99)
Potassium: 4.1 mmol/L (ref 3.5–5.1)
Sodium: 138 mmol/L (ref 135–145)

## 2021-12-07 LAB — TYPE AND SCREEN
ABO/RH(D): A POS
Antibody Screen: NEGATIVE

## 2021-12-07 LAB — HEMOGLOBIN A1C
Est. average glucose Bld gHb Est-mCnc: <74 mg/dL
Hgb A1c MFr Bld: <4.2 % — ABNORMAL LOW (ref 4.8–5.6)

## 2021-12-07 LAB — HCV INTERPRETATION

## 2021-12-07 LAB — URINE CULTURE, OB REFLEX

## 2021-12-07 LAB — CULTURE, OB URINE

## 2021-12-07 SURGERY — DILATION AND EVACUATION, UTERUS
Anesthesia: General

## 2021-12-07 MED ORDER — FENTANYL CITRATE (PF) 250 MCG/5ML IJ SOLN
INTRAMUSCULAR | Status: AC
  Filled 2021-12-07: qty 5

## 2021-12-07 MED ORDER — FENTANYL CITRATE (PF) 250 MCG/5ML IJ SOLN
INTRAMUSCULAR | Status: DC | PRN
  Administered 2021-12-07: 25 ug via INTRAVENOUS
  Administered 2021-12-07: 50 ug via INTRAVENOUS
  Administered 2021-12-07: 25 ug via INTRAVENOUS

## 2021-12-07 MED ORDER — ACETAMINOPHEN 160 MG/5ML PO SOLN: 1000.0000 mg | Freq: Once | ORAL | Status: AC | PRN

## 2021-12-07 MED ORDER — POVIDONE-IODINE 10 % EX SWAB
2.0000 | Freq: Once | CUTANEOUS | Status: AC
  Administered 2021-12-07: 2 via TOPICAL

## 2021-12-07 MED ORDER — TRAMADOL HCL 50 MG PO TABS: 50.0000 mg | ORAL_TABLET | Freq: Four times a day (QID) | ORAL | 0 refills | Status: DC | PRN

## 2021-12-07 MED ORDER — LACTATED RINGERS IV SOLN: INTRAVENOUS | Status: DC

## 2021-12-07 MED ORDER — MIDAZOLAM HCL 2 MG/2ML IJ SOLN
INTRAMUSCULAR | Status: AC
  Filled 2021-12-07: qty 2

## 2021-12-07 MED ORDER — CHLORHEXIDINE GLUCONATE 0.12 % MT SOLN
15.0000 mL | Freq: Once | OROMUCOSAL | Status: AC
Start: 2021-12-07 — End: 2021-12-07
  Administered 2021-12-07: 15 mL via OROMUCOSAL

## 2021-12-07 MED ORDER — CHLORHEXIDINE GLUCONATE 0.12 % MT SOLN
OROMUCOSAL | Status: AC
  Filled 2021-12-07: qty 15

## 2021-12-07 MED ORDER — DOXYCYCLINE HYCLATE 100 MG IV SOLR
200.0000 mg | INTRAVENOUS | Status: AC
  Administered 2021-12-07: 200 mg via INTRAVENOUS
  Filled 2021-12-07: qty 200

## 2021-12-07 MED ORDER — PROPOFOL 500 MG/50ML IV EMUL
INTRAVENOUS | Status: DC | PRN
  Administered 2021-12-07: 150 ug/kg/min via INTRAVENOUS

## 2021-12-07 MED ORDER — PROPOFOL 10 MG/ML IV BOLUS
INTRAVENOUS | Status: AC
  Filled 2021-12-07: qty 20

## 2021-12-07 MED ORDER — FENTANYL CITRATE (PF) 100 MCG/2ML IJ SOLN
25.0000 ug | INTRAMUSCULAR | Status: DC | PRN
  Administered 2021-12-07: 50 ug via INTRAVENOUS

## 2021-12-07 MED ORDER — ACETAMINOPHEN 500 MG PO TABS
ORAL_TABLET | ORAL | Status: AC
  Filled 2021-12-07: qty 2

## 2021-12-07 MED ORDER — ACETAMINOPHEN 500 MG PO TABS
1000.0000 mg | ORAL_TABLET | Freq: Once | ORAL | Status: AC | PRN
  Administered 2021-12-07: 1000 mg via ORAL

## 2021-12-07 MED ORDER — ACETAMINOPHEN 10 MG/ML IV SOLN: 1000.0000 mg | Freq: Once | INTRAVENOUS | Status: DC | PRN

## 2021-12-07 MED ORDER — ONDANSETRON HCL 4 MG/2ML IJ SOLN
INTRAMUSCULAR | Status: AC
  Filled 2021-12-07: qty 2

## 2021-12-07 MED ORDER — 0.9 % SODIUM CHLORIDE (POUR BTL) OPTIME
TOPICAL | Status: DC | PRN
  Administered 2021-12-07: 1000 mL

## 2021-12-07 MED ORDER — LACTATED RINGERS IV SOLN: INTRAVENOUS | Status: DC | PRN

## 2021-12-07 MED ORDER — DEXAMETHASONE SODIUM PHOSPHATE 10 MG/ML IJ SOLN
INTRAMUSCULAR | Status: AC
  Filled 2021-12-07: qty 1

## 2021-12-07 MED ORDER — ONDANSETRON HCL 4 MG/2ML IJ SOLN
INTRAMUSCULAR | Status: DC | PRN
  Administered 2021-12-07: 4 mg via INTRAVENOUS

## 2021-12-07 MED ORDER — BUPIVACAINE HCL (PF) 0.5 % IJ SOLN: INTRAMUSCULAR | Status: DC | PRN

## 2021-12-07 MED ORDER — FENTANYL CITRATE (PF) 100 MCG/2ML IJ SOLN
INTRAMUSCULAR | Status: AC
  Filled 2021-12-07: qty 2

## 2021-12-07 MED ORDER — ORAL CARE MOUTH RINSE
15.0000 mL | Freq: Once | OROMUCOSAL | Status: AC
Start: 2021-12-07 — End: 2021-12-07

## 2021-12-07 MED ORDER — KETOROLAC TROMETHAMINE 30 MG/ML IJ SOLN
INTRAMUSCULAR | Status: DC | PRN
  Administered 2021-12-07: 30 mg via INTRAVENOUS

## 2021-12-07 MED ORDER — MIDAZOLAM HCL 2 MG/2ML IJ SOLN
INTRAMUSCULAR | Status: DC | PRN
  Administered 2021-12-07: 2 mg via INTRAVENOUS

## 2021-12-07 MED ORDER — BUPIVACAINE HCL (PF) 0.5 % IJ SOLN
INTRAMUSCULAR | Status: AC
Start: 2021-12-07 — End: ?
  Filled 2021-12-07: qty 30

## 2021-12-07 SURGICAL SUPPLY — 21 items
CATH ROBINSON RED A/P 16FR (CATHETERS) ×1 IMPLANT
FILTER UTR ASPR ASSEMBLY (MISCELLANEOUS) ×1 IMPLANT
GLOVE BIO SURGEON STRL SZ 6.5 (GLOVE) ×1 IMPLANT
GLOVE BIOGEL PI IND STRL 7.0 (GLOVE) ×2 IMPLANT
GLOVE BIOGEL PI INDICATOR 7.0 (GLOVE) ×2
GOWN STRL REUS W/ TWL LRG LVL3 (GOWN DISPOSABLE) ×2 IMPLANT
GOWN STRL REUS W/TWL LRG LVL3 (GOWN DISPOSABLE) ×2
HOSE CONNECTING 18IN BERKELEY (TUBING) ×1 IMPLANT
KIT BERKELEY 1ST TRI 3/8 NO TR (MISCELLANEOUS) ×1 IMPLANT
KIT BERKELEY 1ST TRIMESTER 3/8 (MISCELLANEOUS) ×1 IMPLANT
NS IRRIG 1000ML POUR BTL (IV SOLUTION) ×1 IMPLANT
PACK VAGINAL MINOR WOMEN LF (CUSTOM PROCEDURE TRAY) ×1 IMPLANT
PAD OB MATERNITY 4.3X12.25 (PERSONAL CARE ITEMS) ×1 IMPLANT
SET BERKELEY SUCTION TUBING (SUCTIONS) IMPLANT
SPIKE FLUID TRANSFER (MISCELLANEOUS) IMPLANT
TOWEL GREEN STERILE FF (TOWEL DISPOSABLE) ×2 IMPLANT
UNDERPAD 30X36 HEAVY ABSORB (UNDERPADS AND DIAPERS) ×1 IMPLANT
VACURETTE 10 RIGID CVD (CANNULA) IMPLANT
VACURETTE 7MM CVD STRL WRAP (CANNULA) IMPLANT
VACURETTE 8 RIGID CVD (CANNULA) IMPLANT
VACURETTE 9 RIGID CVD (CANNULA) IMPLANT

## 2021-12-07 NOTE — Op Note (Signed)
[name] PROCEDURE DATE: 12/07/2021  PREOPERATIVE DIAGNOSIS: 9 week missed abortion POSTOPERATIVE DIAGNOSIS: The same PROCEDURE:     Dilation and Evacuation SURGEON:  Scheryl Darter MD  INDICATIONS: 41 y.o. [id number] with MAB at [redacted] weeks gestation, needing surgical completion.  Risks of surgery were discussed with the patient including but not limited to: bleeding which may require transfusion; infection which may require antibiotics; injury to uterus or surrounding organs; need for additional procedures including laparotomy or laparoscopy; possibility of intrauterine scarring which may impair future fertility; and other postoperative/anesthesia complications. Written informed consent was obtained.    FINDINGS:  A 9 week size uterus, moderate amounts of products of conception, specimen sent to pathology.  ANESTHESIA:    Monitored intravenous sedation, paracervical block. INTRAVENOUS FLUIDS:  1000 ml of LR ESTIMATED BLOOD LOSS:  Less than 20 ml. SPECIMENS:  Products of conception sent to pathology COMPLICATIONS:  None immediate.  PROCEDURE DETAILS:  The patient received intravenous Doxycycline while in the preoperative area.  She was then taken to the operating room where monitored intravenous sedation was administered and was found to be adequate.  After an adequate timeout was performed, she was placed in the dorsal lithotomy position and examined; then prepped and draped in the sterile manner.   Her bladder was catheterized for an unmeasured amount of clear, yellow urine. A vaginal speculum was then placed in the patient's vagina and a single tooth tenaculum was applied to the anterior lip of the cervix. The cervix was gently dilated to accommodate a 9 mm suction curette that was gently advanced to the uterine fundus.  The suction device was then activated and curette slowly rotated to clear the uterus of products of conception, confirmed complete emptying of the uterus. There was minimal  bleeding noted and the tenaculum removed with good hemostasis noted.   All instruments were removed from the patient's vagina.  Sponge and instrument counts were correct times two  The patient tolerated the procedure well and was taken to the recovery area awake, and in stable condition.   Adam Phenix, MD 12/07/2021 12:25 PM
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
## 2021-12-07 NOTE — Transfer of Care (Signed)
Immediate Anesthesia Transfer of Care Note  Patient: [name]  Procedure(s) Performed: DILATATION AND EVACUATION  Patient Location: PACU  Anesthesia Type:MAC  Level of Consciousness: awake, drowsy, patient cooperative and responds to stimulation  Airway & Oxygen Therapy: Patient Spontanous Breathing and Patient connected to nasal cannula oxygen  Post-op Assessment: Report given to RN and Post -op Vital signs reviewed and stable  Post vital signs: Reviewed and stable  Last Vitals:  Vitals Value Taken Time  BP 116/78 12/07/21 1145  Temp 36.3 C 12/07/21 1140  Pulse 71 12/07/21 1152  Resp 16 12/07/21 1152  SpO2 98 % 12/07/21 1152  Vitals shown include unvalidated device data.  Last Pain:  Vitals:   12/07/21 1140  TempSrc:   PainSc: Asleep      Patients Stated Pain Goal: 0 (75/79/72 8206)  Complications: No notable events documented.

## 2021-12-07 NOTE — Progress Notes (Signed)
Wasted of fentanyl with Nelly Laurence, RN. In the stericycle.

## 2021-12-10 ENCOUNTER — Encounter (HOSPITAL_COMMUNITY): Payer: Self-pay | Admitting: Obstetrics & Gynecology

## 2021-12-11 LAB — PANORAMA PRENATAL TEST FULL PANEL:PANORAMA TEST PLUS 5 ADDITIONAL MICRODELETIONS: FETAL FRACTION: 4.6

## 2021-12-11 LAB — CYTOLOGY - PAP
Comment: NEGATIVE
Diagnosis: NEGATIVE
High risk HPV: NEGATIVE

## 2021-12-11 LAB — SURGICAL PATHOLOGY

## 2021-12-12 LAB — HORIZON CUSTOM: REPORT SUMMARY: NEGATIVE

## 2021-12-13 NOTE — Anesthesia Postprocedure Evaluation (Signed)
Anesthesia Post Note  Patient: Jonia S Gentile  Procedure(s) Performed: DILATATION AND EVACUATION     Patient location during evaluation: PACU Anesthesia Type: General Level of consciousness: awake and alert Pain management: pain level controlled Vital Signs Assessment: post-procedure vital signs reviewed and stable Respiratory status: spontaneous breathing, nonlabored ventilation, respiratory function stable and patient connected to nasal cannula oxygen Cardiovascular status: blood pressure returned to baseline and stable Postop Assessment: no apparent nausea or vomiting Anesthetic complications: no   No notable events documented.  Last Vitals:  Vitals:   12/07/21 1230 12/07/21 1245  BP: 114/89 121/83  Pulse: 79   Resp: 12 17  Temp: (!) 36.3 C   SpO2: 100%     Last Pain:  Vitals:   12/07/21 1210  TempSrc:   PainSc: 10-Worst pain ever                 Antonio Woodhams

## 2022-01-02 ENCOUNTER — Encounter: Payer: Medicaid Other | Admitting: Obstetrics and Gynecology

## 2022-01-03 NOTE — Progress Notes (Signed)
Subjective:     Patient ID: [name], female   DOB: Nov 28, 1980, 41 y.o.   MRN: [id number]  HPI: Here for F/U after D&C for missed AB 12/07/21 measuring 9 weeks. Reports moderate bleeding, passing clots, possible tissue ("looks like hamburger meat") and low abd, low back continuously since procedure. No improvement in pain w/ Tylenol. Did not get Rx for Tramadol because it was sent to pharmacy electronically and pt is incarcerated. Rx should have been printed to be given to guards.   Pt reports that she has not being given pads in prison.   Review of Systems Hasn't checked temp. Pos for vaginal bleeding, passing clots and possible tissue, urinary frequency, constipation. Neg for chills, dizziness, N/V/D, dysuria, flank pain, vaginal discharge.      Objective:  BP (!) 128/90   Pulse 63   Ht 5\' 3"  (1.6 m)   BMI 25.33 kg/m    Physical Exam Vitals and nursing note reviewed. Exam conducted with a chaperone present.  Constitutional:      General: She is not in acute distress.    Appearance: Normal appearance. She is normal weight. She is not ill-appearing or toxic-appearing.  Cardiovascular:     Rate and Rhythm: Normal rate.  Pulmonary:     Effort: Pulmonary effort is normal.  Abdominal:     General: Abdomen is flat.     Palpations: Abdomen is soft. There is mass (possible enlarged uterus, mild-mod TTP).     Tenderness: There is abdominal tenderness (mild LLQ tenderness). There is no guarding.  Genitourinary:    General: Normal vulva.     Labia:        Right: No rash.        Left: No rash.      Vagina: Bleeding present. Other prolapse of vaginal walls w/o mention of uterine prolapse: small-mod amoung of brown blood in vault. Nml odor..    Cervix: No cervical motion tenderness, discharge or friability.     Uterus: Enlarged and tender.      Adnexa: Right adnexa normal and left adnexa normal.       Right: No mass or tenderness.         Left: No mass or tenderness.    Skin:     General: Skin is warm and dry.     Coloration: Skin is not pale.  Neurological:     Mental Status: She is alert and oriented to person, place, and time.  Psychiatric:        Mood and Affect: Mood normal.   12/07/21 Pathology report  FINAL MICROSCOPIC DIAGNOSIS:   A. PRODUCTS OF CONCEPTION, DILATATION AND EVACUATION:  - Products of conception (chorionic villi and scant fetal tissue)   GROSS DESCRIPTION:   A. Received fresh and subsequently placed in formalin labeled with the  patient's name and "Products of conception" is a 4.4 x 4.1 x 1.3 cm  aggregate of red-tan, shaggy soft tissue fragments. No villous tissue,  fetal somatic tissue, or cystic structures are grossly identified. A  representative portion is submitted in five cassettes.     Assessment/Plan:      1. Chronic hypertension - F/U w/ PCP  2. Opiate use - Caution with prescribing pain meds.   3. Missed abortion  - US OB Transvaginal; Future  4. Spontaneous abortion complicated by delayed or excessive hemorrhage--Concern for retained POC's. Uterus mildly TTP, but low suspicion for endometritis due to benign exam, absence of fever and chills.  - Beta hCG  quant (ref lab) - CBC w/Diff - traMADol (ULTRAM) 50 MG tablet; Take 2 tablets (100 mg total) by mouth every 6 (six) hours as needed for severe pain.  Dispense: 30 tablet; Refill: 0 - ketorolac (TORADOL) 10 MG tablet; Take 1 tablet (10 mg total) by mouth every 6 (six) hours as needed.  Dispense: 20 tablet; Refill: 0 - Will consider ABX if WBC elevated.  - Instructed to go to [facility] for fever > 100.4, severe abd pain, severe bleeding.  - Considered prescribing Cytotec, but pt in incarcerated and CNM has concerns about how complications would be managed in prison setting.  - Dr. Damita Dunnings consulted.  - 1 week F/U to discuss labs and [place].    5. Abdominal pain  - [facility]  [place], [place] 01/04/2022 1:59 PM

## 2022-01-04 ENCOUNTER — Ambulatory Visit (INDEPENDENT_AMBULATORY_CARE_PROVIDER_SITE_OTHER): Payer: Medicaid Other | Admitting: Advanced Practice Midwife

## 2022-01-04 ENCOUNTER — Other Ambulatory Visit (HOSPITAL_COMMUNITY)
Admission: RE | Admit: 2022-01-04 | Discharge: 2022-01-04 | Disposition: A | Discharge status: Home / Self Care | Payer: Medicaid Other | Source: Ambulatory Visit | Attending: Obstetrics and Gynecology | Admitting: Obstetrics and Gynecology

## 2022-01-04 ENCOUNTER — Encounter: Payer: Self-pay | Admitting: Advanced Practice Midwife

## 2022-01-04 VITALS — BP 128/90 | HR 63 | Ht 63.0 in

## 2022-01-04 DIAGNOSIS — N898 Other specified noninflammatory disorders of vagina: Secondary | ICD-10-CM | POA: Diagnosis not present

## 2022-01-04 DIAGNOSIS — R109 Unspecified abdominal pain: Secondary | ICD-10-CM

## 2022-01-04 DIAGNOSIS — F1190 Opioid use, unspecified, uncomplicated: Secondary | ICD-10-CM

## 2022-01-04 DIAGNOSIS — O021 Missed abortion: Secondary | ICD-10-CM | POA: Diagnosis not present

## 2022-01-04 DIAGNOSIS — I10 Essential (primary) hypertension: Secondary | ICD-10-CM

## 2022-01-04 DIAGNOSIS — O036 Delayed or excessive hemorrhage following complete or unspecified spontaneous abortion: Secondary | ICD-10-CM

## 2022-01-04 MED ORDER — KETOROLAC TROMETHAMINE 10 MG PO TABS: 10.0000 mg | ORAL_TABLET | Freq: Four times a day (QID) | ORAL | 0 refills | Status: DC | PRN

## 2022-01-04 MED ORDER — TRAMADOL HCL 50 MG PO TABS: 100.0000 mg | ORAL_TABLET | Freq: Four times a day (QID) | ORAL | 0 refills | Status: DC | PRN

## 2022-01-04 NOTE — Progress Notes (Signed)
U/S scheduled on 01/10/22 @ 11am, please arrive at 10:45 here [facility].

## 2022-01-05 LAB — CBC WITH DIFFERENTIAL/PLATELET
Basophils Absolute: 0 10*3/uL (ref 0.0–0.2)
Basos: 0 %
EOS (ABSOLUTE): 0.1 10*3/uL (ref 0.0–0.4)
Eos: 1 %
Hematocrit: 38.7 % (ref 34.0–46.6)
Hemoglobin: 13 g/dL (ref 11.1–15.9)
Immature Grans (Abs): 0 10*3/uL (ref 0.0–0.1)
Immature Granulocytes: 0 %
Lymphocytes Absolute: 2.1 10*3/uL (ref 0.7–3.1)
Lymphs: 32 %
MCH: 30.6 pg (ref 26.6–33.0)
MCHC: 33.6 g/dL (ref 31.5–35.7)
MCV: 91 fL (ref 79–97)
Monocytes Absolute: 0.6 10*3/uL (ref 0.1–0.9)
Monocytes: 9 %
Neutrophils Absolute: 3.7 10*3/uL (ref 1.4–7.0)
Neutrophils: 58 %
Platelets: 179 10*3/uL (ref 150–450)
RBC: 4.25 x10E6/uL (ref 3.77–5.28)
RDW: 13 % (ref 11.7–15.4)
WBC: 6.5 10*3/uL (ref 3.4–10.8)

## 2022-01-05 LAB — BETA HCG QUANT (REF LAB): hCG Quant: 101 m[IU]/mL

## 2022-01-07 LAB — CERVICOVAGINAL ANCILLARY ONLY
Bacterial Vaginitis (gardnerella): NEGATIVE
Candida Glabrata: NEGATIVE
Candida Vaginitis: NEGATIVE
Chlamydia: NEGATIVE
Comment: NEGATIVE
Comment: NEGATIVE
Comment: NEGATIVE
Comment: NEGATIVE
Comment: NEGATIVE
Comment: NORMAL
Neisseria Gonorrhea: NEGATIVE
Trichomonas: NEGATIVE

## 2022-01-10 ENCOUNTER — Inpatient Hospital Stay: Admission: RE | Admit: 2022-01-10 | Payer: Medicaid Other | Source: Ambulatory Visit

## 2022-01-17 ENCOUNTER — Ambulatory Visit: Admission: RE | Admit: 2022-01-17 | Payer: Medicaid Other | Source: Ambulatory Visit

## 2022-01-22 ENCOUNTER — Ambulatory Visit: Payer: Medicaid Other | Admitting: Advanced Practice Midwife

## 2022-02-01 ENCOUNTER — Encounter: Payer: Medicaid Other | Admitting: Obstetrics and Gynecology

## 2022-02-27 ENCOUNTER — Encounter: Payer: Self-pay | Admitting: Obstetrics & Gynecology

## 2022-03-19 ENCOUNTER — Encounter: Payer: Self-pay | Admitting: Family Medicine

## 2022-03-19 ENCOUNTER — Ambulatory Visit: Payer: Medicaid Other | Admitting: Family Medicine

## 2022-03-25 ENCOUNTER — Ambulatory Visit
Admission: RE | Admit: 2022-03-25 | Discharge: 2022-03-25 | Disposition: A | Discharge status: Home / Self Care | Payer: Medicaid Other | Source: Ambulatory Visit | Attending: Advanced Practice Midwife | Admitting: Advanced Practice Midwife

## 2022-03-25 DIAGNOSIS — O021 Missed abortion: Secondary | ICD-10-CM | POA: Insufficient documentation

## 2022-04-15 ENCOUNTER — Other Ambulatory Visit (HOSPITAL_COMMUNITY)
Admission: RE | Admit: 2022-04-15 | Discharge: 2022-04-15 | Disposition: A | Discharge status: Home / Self Care | Payer: Medicaid Other | Source: Ambulatory Visit | Attending: Family Medicine | Admitting: Family Medicine

## 2022-04-15 ENCOUNTER — Ambulatory Visit (INDEPENDENT_AMBULATORY_CARE_PROVIDER_SITE_OTHER): Payer: Medicaid Other | Admitting: Advanced Practice Midwife

## 2022-04-15 VITALS — BP 139/94 | HR 64

## 2022-04-15 DIAGNOSIS — Z3A09 9 weeks gestation of pregnancy: Secondary | ICD-10-CM

## 2022-04-15 DIAGNOSIS — N898 Other specified noninflammatory disorders of vagina: Secondary | ICD-10-CM

## 2022-04-15 DIAGNOSIS — O039 Complete or unspecified spontaneous abortion without complication: Secondary | ICD-10-CM | POA: Diagnosis not present

## 2022-04-15 DIAGNOSIS — R109 Unspecified abdominal pain: Secondary | ICD-10-CM | POA: Diagnosis not present

## 2022-04-15 DIAGNOSIS — Z5189 Encounter for other specified aftercare: Secondary | ICD-10-CM

## 2022-04-15 LAB — POCT URINALYSIS DIP (DEVICE)
Bilirubin Urine: NEGATIVE
Glucose, UA: NEGATIVE mg/dL
Hgb urine dipstick: NEGATIVE
Ketones, ur: NEGATIVE mg/dL
Leukocytes,Ua: NEGATIVE
Nitrite: NEGATIVE
Protein, ur: NEGATIVE mg/dL
Specific Gravity, Urine: >=1.03 (ref 1.005–1.030)
Urobilinogen, UA: 0.2 mg/dL (ref 0.0–1.0)
pH: 7 (ref 5.0–8.0)

## 2022-04-15 MED ORDER — IBUPROFEN 600 MG PO TABS: 600.0000 mg | ORAL_TABLET | Freq: Four times a day (QID) | ORAL | 1 refills | Status: AC | PRN
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
## 2022-04-16 LAB — CERVICOVAGINAL ANCILLARY ONLY
Bacterial Vaginitis (gardnerella): POSITIVE — AB
Candida Glabrata: NEGATIVE
Candida Vaginitis: NEGATIVE
Chlamydia: NEGATIVE
Comment: NEGATIVE
Comment: NEGATIVE
Comment: NEGATIVE
Comment: NEGATIVE
Comment: NEGATIVE
Comment: NORMAL
Neisseria Gonorrhea: NEGATIVE
Trichomonas: NEGATIVE

## 2022-04-17 LAB — URINE CULTURE

## 2022-04-24 ENCOUNTER — Telehealth: Payer: Self-pay | Admitting: Lactation Services

## 2022-04-24 MED ORDER — METRONIDAZOLE 500 MG PO TABS: 500.0000 mg | ORAL_TABLET | Freq: Two times a day (BID) | ORAL | 0 refills | Status: AC

## 2022-04-24 NOTE — Telephone Encounter (Signed)
-----  Message from [place], [place] sent at 04/23/2022  9:32 PM EST ----- I need to get a Flagyl prescription to this pt in jail. 1 PO BID x 7 days. #14. No refills. Can it be printed and mailed?

## 2022-04-24 NOTE — Telephone Encounter (Signed)
Called [place] and received voicemail that she is out of the office and to call Morey Hummingbird [phone].   Called [place] and she transferred me back to [employer].   Called back and spoke with Sharyn Lull in Medical at [phone]. She reports that need to fax script to Medical via fax at [phone] [facility] and they will order medication and get it started.   Prescription faxed to [facility], [place]. Fax Confirmation received.
